# Patient Record
Sex: Female | Born: 1985 | Race: Black or African American | Hispanic: No | Marital: Single | Smoking: Never smoker
Health system: Southern US, Community
[De-identification: ages and names within clinical notes are randomized; demographics above are authoritative.]

## PROBLEM LIST (undated history)

## (undated) ENCOUNTER — Inpatient Hospital Stay (HOSPITAL_COMMUNITY): Payer: Self-pay

## (undated) DIAGNOSIS — S82899A Other fracture of unspecified lower leg, initial encounter for closed fracture: Secondary | ICD-10-CM

## (undated) DIAGNOSIS — IMO0002 Reserved for concepts with insufficient information to code with codable children: Secondary | ICD-10-CM

## (undated) DIAGNOSIS — B009 Herpesviral infection, unspecified: Secondary | ICD-10-CM

## (undated) DIAGNOSIS — F319 Bipolar disorder, unspecified: Secondary | ICD-10-CM

## (undated) DIAGNOSIS — F419 Anxiety disorder, unspecified: Secondary | ICD-10-CM

## (undated) DIAGNOSIS — E109 Type 1 diabetes mellitus without complications: Secondary | ICD-10-CM

## (undated) DIAGNOSIS — F32A Depression, unspecified: Secondary | ICD-10-CM

## (undated) DIAGNOSIS — D649 Anemia, unspecified: Secondary | ICD-10-CM

## (undated) DIAGNOSIS — D563 Thalassemia minor: Secondary | ICD-10-CM

## (undated) DIAGNOSIS — N390 Urinary tract infection, site not specified: Secondary | ICD-10-CM

## (undated) DIAGNOSIS — R87619 Unspecified abnormal cytological findings in specimens from cervix uteri: Secondary | ICD-10-CM

## (undated) DIAGNOSIS — A749 Chlamydial infection, unspecified: Secondary | ICD-10-CM

## (undated) DIAGNOSIS — Z8669 Personal history of other diseases of the nervous system and sense organs: Secondary | ICD-10-CM

## (undated) DIAGNOSIS — K900 Celiac disease: Secondary | ICD-10-CM

## (undated) DIAGNOSIS — R51 Headache: Secondary | ICD-10-CM

## (undated) DIAGNOSIS — F329 Major depressive disorder, single episode, unspecified: Secondary | ICD-10-CM

## (undated) DIAGNOSIS — O139 Gestational [pregnancy-induced] hypertension without significant proteinuria, unspecified trimester: Secondary | ICD-10-CM

## (undated) HISTORY — DX: Thalassemia minor: D56.3

## (undated) HISTORY — DX: Herpesviral infection, unspecified: B00.9

## (undated) HISTORY — DX: Type 1 diabetes mellitus without complications: E10.9

## (undated) HISTORY — PX: COLPOSCOPY: SHX161

## (undated) HISTORY — DX: Celiac disease: K90.0

## (undated) HISTORY — PX: TUBAL LIGATION: SHX77

## (undated) HISTORY — PX: WISDOM TOOTH EXTRACTION: SHX21

---
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
## 2007-02-01 ENCOUNTER — Inpatient Hospital Stay (HOSPITAL_COMMUNITY): Admission: AD | Admit: 2007-02-01 | Discharge: 2007-02-01 | Payer: Self-pay | Admitting: Family Medicine

## 2007-02-09 ENCOUNTER — Emergency Department (HOSPITAL_COMMUNITY): Admission: EM | Admit: 2007-02-09 | Discharge: 2007-02-09 | Payer: Self-pay | Admitting: Emergency Medicine

## 2007-07-30 ENCOUNTER — Inpatient Hospital Stay (HOSPITAL_COMMUNITY): Admission: AD | Admit: 2007-07-30 | Discharge: 2007-07-30 | Payer: Self-pay | Admitting: Obstetrics and Gynecology

## 2007-08-26 ENCOUNTER — Inpatient Hospital Stay (HOSPITAL_COMMUNITY): Admission: AD | Admit: 2007-08-26 | Discharge: 2007-08-26 | Payer: Self-pay | Admitting: Obstetrics and Gynecology

## 2007-08-29 ENCOUNTER — Inpatient Hospital Stay (HOSPITAL_COMMUNITY): Admission: AD | Admit: 2007-08-29 | Discharge: 2007-08-29 | Payer: Self-pay | Admitting: Obstetrics and Gynecology

## 2007-09-04 ENCOUNTER — Inpatient Hospital Stay (HOSPITAL_COMMUNITY): Admission: AD | Admit: 2007-09-04 | Discharge: 2007-09-04 | Payer: Self-pay | Admitting: Obstetrics and Gynecology

## 2007-09-10 ENCOUNTER — Inpatient Hospital Stay (HOSPITAL_COMMUNITY): Admission: AD | Admit: 2007-09-10 | Discharge: 2007-09-14 | Payer: Self-pay | Admitting: Obstetrics and Gynecology

## 2007-09-12 ENCOUNTER — Encounter (INDEPENDENT_AMBULATORY_CARE_PROVIDER_SITE_OTHER): Payer: Self-pay | Admitting: Obstetrics and Gynecology

## 2008-07-08 ENCOUNTER — Ambulatory Visit: Payer: Self-pay | Admitting: Diagnostic Radiology

## 2008-07-08 ENCOUNTER — Emergency Department (HOSPITAL_BASED_OUTPATIENT_CLINIC_OR_DEPARTMENT_OTHER): Admission: EM | Admit: 2008-07-08 | Discharge: 2008-07-08 | Payer: Self-pay | Admitting: Emergency Medicine

## 2008-07-09 ENCOUNTER — Inpatient Hospital Stay (HOSPITAL_COMMUNITY): Admission: AD | Admit: 2008-07-09 | Discharge: 2008-07-10 | Payer: Self-pay | Admitting: Obstetrics and Gynecology

## 2009-01-30 ENCOUNTER — Emergency Department (HOSPITAL_BASED_OUTPATIENT_CLINIC_OR_DEPARTMENT_OTHER): Admission: EM | Admit: 2009-01-30 | Discharge: 2009-01-30 | Payer: Self-pay | Admitting: Emergency Medicine

## 2009-03-05 ENCOUNTER — Emergency Department (HOSPITAL_BASED_OUTPATIENT_CLINIC_OR_DEPARTMENT_OTHER): Admission: EM | Admit: 2009-03-05 | Discharge: 2009-03-05 | Payer: Self-pay | Admitting: Emergency Medicine

## 2009-05-31 ENCOUNTER — Emergency Department (HOSPITAL_BASED_OUTPATIENT_CLINIC_OR_DEPARTMENT_OTHER): Admission: EM | Admit: 2009-05-31 | Discharge: 2009-05-31 | Payer: Self-pay | Admitting: Emergency Medicine

## 2009-06-16 ENCOUNTER — Emergency Department (HOSPITAL_BASED_OUTPATIENT_CLINIC_OR_DEPARTMENT_OTHER): Admission: EM | Admit: 2009-06-16 | Discharge: 2009-06-16 | Payer: Self-pay | Admitting: Emergency Medicine

## 2009-09-27 ENCOUNTER — Emergency Department (HOSPITAL_BASED_OUTPATIENT_CLINIC_OR_DEPARTMENT_OTHER): Admission: EM | Admit: 2009-09-27 | Discharge: 2009-09-27 | Payer: Self-pay | Admitting: Emergency Medicine

## 2010-02-07 ENCOUNTER — Emergency Department (HOSPITAL_BASED_OUTPATIENT_CLINIC_OR_DEPARTMENT_OTHER): Admission: EM | Admit: 2010-02-07 | Discharge: 2010-02-07 | Payer: Self-pay | Admitting: Emergency Medicine

## 2010-04-19 ENCOUNTER — Ambulatory Visit: Payer: Self-pay | Admitting: Obstetrics & Gynecology

## 2010-04-19 LAB — CONVERTED CEMR LAB: HCV Ab: NEGATIVE

## 2010-04-20 ENCOUNTER — Encounter: Payer: Self-pay | Admitting: Obstetrics & Gynecology

## 2010-04-20 LAB — CONVERTED CEMR LAB
Trich, Wet Prep: NONE SEEN
Yeast Wet Prep HPF POC: NONE SEEN

## 2010-06-19 ENCOUNTER — Encounter: Payer: Self-pay | Admitting: Obstetrics & Gynecology

## 2010-06-19 ENCOUNTER — Ambulatory Visit: Payer: Self-pay | Admitting: Obstetrics & Gynecology

## 2010-06-19 LAB — CONVERTED CEMR LAB: Chlamydia, DNA Probe: NEGATIVE

## 2010-06-20 ENCOUNTER — Encounter: Payer: Self-pay | Admitting: Obstetrics & Gynecology

## 2010-06-20 LAB — CONVERTED CEMR LAB
Trich, Wet Prep: NONE SEEN
Yeast Wet Prep HPF POC: NONE SEEN

## 2010-08-22 ENCOUNTER — Ambulatory Visit: Payer: Self-pay

## 2010-08-22 DIAGNOSIS — Z23 Encounter for immunization: Secondary | ICD-10-CM

## 2010-09-04 ENCOUNTER — Emergency Department (HOSPITAL_BASED_OUTPATIENT_CLINIC_OR_DEPARTMENT_OTHER)
Admission: EM | Admit: 2010-09-04 | Discharge: 2010-09-04 | Disposition: A | Discharge status: Home / Self Care | Payer: Self-pay | Attending: Emergency Medicine | Admitting: Emergency Medicine

## 2010-09-04 DIAGNOSIS — J029 Acute pharyngitis, unspecified: Secondary | ICD-10-CM | POA: Insufficient documentation

## 2010-09-04 DIAGNOSIS — J45909 Unspecified asthma, uncomplicated: Secondary | ICD-10-CM | POA: Insufficient documentation

## 2010-09-07 ENCOUNTER — Encounter: Payer: Self-pay | Admitting: Obstetrics and Gynecology

## 2010-09-07 ENCOUNTER — Ambulatory Visit: Payer: Self-pay | Admitting: Obstetrics and Gynecology

## 2010-09-07 DIAGNOSIS — Z118 Encounter for screening for other infectious and parasitic diseases: Secondary | ICD-10-CM

## 2010-09-07 DIAGNOSIS — Z113 Encounter for screening for infections with a predominantly sexual mode of transmission: Secondary | ICD-10-CM

## 2010-09-07 LAB — CONVERTED CEMR LAB
Chlamydia, DNA Probe: NEGATIVE
HCV Ab: NEGATIVE
HIV: NONREACTIVE

## 2010-09-08 ENCOUNTER — Encounter: Payer: Self-pay | Admitting: Obstetrics and Gynecology

## 2010-09-08 LAB — CONVERTED CEMR LAB: Yeast Wet Prep HPF POC: NONE SEEN

## 2010-09-18 LAB — POCT URINALYSIS DIPSTICK
Bilirubin Urine: NEGATIVE
Nitrite: NEGATIVE
Protein, ur: NEGATIVE mg/dL
Urobilinogen, UA: 0.2 mg/dL (ref 0.0–1.0)
pH: 6 (ref 5.0–8.0)

## 2010-09-22 LAB — DIFFERENTIAL
Eosinophils Absolute: 0.1 10*3/uL (ref 0.0–0.7)
Eosinophils Relative: 2 % (ref 0–5)
Lymphs Abs: 1.5 10*3/uL (ref 0.7–4.0)
Monocytes Absolute: 0.4 10*3/uL (ref 0.1–1.0)

## 2010-09-22 LAB — URINALYSIS, ROUTINE W REFLEX MICROSCOPIC
Bilirubin Urine: NEGATIVE
Glucose, UA: NEGATIVE mg/dL
Ketones, ur: NEGATIVE mg/dL
Protein, ur: NEGATIVE mg/dL

## 2010-09-22 LAB — COMPREHENSIVE METABOLIC PANEL
ALT: 23 U/L (ref 0–35)
AST: 27 U/L (ref 0–37)
Albumin: 4 g/dL (ref 3.5–5.2)
CO2: 27 mEq/L (ref 19–32)
Calcium: 9.1 mg/dL (ref 8.4–10.5)
Chloride: 107 mEq/L (ref 96–112)
GFR calc Af Amer: >60 mL/min (ref >60)
GFR calc non Af Amer: >60 mL/min (ref >60)
Sodium: 141 mEq/L (ref 135–145)
Total Bilirubin: 0.7 mg/dL (ref 0.3–1.2)

## 2010-09-22 LAB — URINE MICROSCOPIC-ADD ON

## 2010-09-22 LAB — CBC
Hemoglobin: 10.2 g/dL — ABNORMAL LOW (ref 12.0–15.0)
RBC: 4.3 MIL/uL (ref 3.87–5.11)
WBC: 3.1 10*3/uL — ABNORMAL LOW (ref 4.0–10.5)

## 2010-09-22 LAB — LIPASE, BLOOD: Lipase: 185 U/L (ref 23–300)

## 2010-09-29 NOTE — Progress Notes (Signed)
NAME:  Christie [name], Christie [name]           ACCOUNT NO.:  [id number]  MEDICAL RECORD NO.:  [id number]           PATIENT TYPE:  A  LOCATION:  [facility]                   FACILITY:  [facility]  PHYSICIAN:  Caren Griffins, CNM       DATE OF BIRTH:  Nov 01, 1985  DATE OF SERVICE:  09/07/2010                                 CLINIC NOTE  REASON FOR VISIT:  Wants STD testing and has lower abdominal pain.  HISTORY:  This is a 25 year old G2 P 1-0-0-1 who is here requesting testing for all the STDs, this is primarily because she has a new partner and also because she has had some postcoital spotting since she had that in the past when she has had Chlamydia and BV.  She is concerned that she might have that again.  She is aware that her GC and Chlamydia were negative when she had her Pap done here on April 24, 2010.  She denies any irritative vaginal discharge and does not have dyspareunia.  LMP was normal on August 23, 2010.  She uses only condoms and plans [facility] to get started on family planning that would be more effective than condoms. She is describing a 2-year history of lower abdominal pain which is primarily in the left lower quadrant and feels like a knot.  She relates the pain to occurring when she has stress or gets mad.  She denies burning in relation to food, has normal bowel movements, and the pain is not effected by bowel movements.  She is not having any pain at present, and she also describes abdominal cramping, primarily with menses, again is not having any cramping at this time.  Pap was normal in October 2011, and there are no changes to her history as documented at that time.  PHYSICAL EXAMINATION:  GENERAL:  WN/WD, pleasant AAF. VITAL SIGNS:  Temperature 97.5, pulse 82, BP 120/66, weight 177.6, height is 5 feet 9 inches. HEENT:  Head normocephalic.  Good dentition. BREASTS:  Breast exam is not repeated. ABDOMEN:  Soft, flat, nontender and no  masses. PELVIC:  NEFG.  BUS negative.  Vagina pink, rugated.  Cervix posterior, no lesions, not friable with exam.  No CMT.  On bimanual, uterus is NSSP.  Nontender adnexa and no masses.  IMPRESSION AND PLAN:  Essentially normal exam.  Her longstanding abdominal pain may be of gastrointestinal origin since she relates its occurrence to stress, so we did talk about stress release measures. Again discussed safe sex and the fact that condoms are only 80% effective, she understands this and plans to go the [facility] for birth control.  I did wet prep, GC/Chlamydia, RPR, HIV, hepatitis B and C as she requests.  FOLLOWUP:  She will be coming back in about 4 months and does have that appointment date to complete her series for Gardasil.  She will come back sooner for any other GYN concerns.          ______________________________ Caren Griffins, CNM   DP/MEDQ  D:  09/07/2010  T:  09/08/2010  Job:  [id number]

## 2010-10-23 LAB — WET PREP, GENITAL
Clue Cells Wet Prep HPF POC: NONE SEEN
Yeast Wet Prep HPF POC: NONE SEEN

## 2010-10-23 LAB — HEPATITIS B SURFACE ANTIGEN: Hepatitis B Surface Ag: NEGATIVE

## 2010-11-21 NOTE — Discharge Summary (Signed)
NAME:  Christie [name], Christie [name]           ACCOUNT NO.:  [id number]   MEDICAL RECORD NO.:  [id number]          PATIENT TYPE:  INP   LOCATION:  9320                          [facility]   PHYSICIAN:  Naima A. Dillard, M.D. DATE OF BIRTH:  March 12, 1986   DATE OF ADMISSION:  09/10/2007  DATE OF DISCHARGE:  09/14/2007                               DISCHARGE SUMMARY   ADMITTING DIAGNOSES:  1. Intrauterine pregnancy at 37-5/7 weeks.  2. Gestational hypertension.  3. Favorable cervix.   DISCHARGE DIAGNOSES:  1. Intrauterine pregnancy at 37-5/7 weeks.  2. Preeclampsia.  3. Macrosomia.  4. Anemia.   PROCEDURES:  1. Normal spontaneous vaginal delivery.  2. Induction of labor.  3. Magnesium sulfate therapy.   HOSPITAL COURSE:  Christie [name] is a 25 year old gravida 1, para 0 at 28-  5/7 weeks, who presented for decreased fetal movement, on the evening of  September 11, 2007.  Her pregnancy had been remarkable for:  1. Asthma.  2. Migraines.  3. Depression with the patient on Zoloft 100 mg p.o. daily.  4. Gestational hypertension on labetalol 3 mg p.o. t.i.d.  5. Group B strep negative.   On arrival to [facility], 88 blood pressures were in the 140's/98-113.  These  remained elevated after labetalol dose, with d above 100.  Cervix was  37% vertex at minus -1.  She had 100 mg protein on voided specimen.  PIH  labs were normal.  The decision was made, in light of her cervix being 3  cm, to admit her for induction.  Pitocin was begun during the night.  The patient had an epidural placed.  Blood pressures were still  150s/105.  Labetalol IV was given on a p.r.n. basis.  Platelet count  showed a mild thrombocytopenia at 139.  Approximately 4 hours later, it  was 120.  IUPC was placed.  The patient progressed to 8 cm by 7:50 p.m.  on the afternoon of March 5.  Subsequent of that, she began to complain  of some right upper quadrant pain.  Blood pressure was 128/86, after a  series of labetalol dosing.  PIH  labs were repeated.  These were  essentially normal, except that time platelet count was 139.  The  patient then progressed to completely dilated by 1:45.  The fetus was at  a +2 station.  Blood pressure was 130s/100.  She had been placed on  magnesium sulfate, with the onset of her labor.  The patient had a  spontaneous vaginal delivery on September 12, 2007 at 2:27 a.m.  She had a  viable female, weight 9 pounds 6 ounces.  Apgars were 1 and 7.  They  responded quickly to stimulation and positive pressure ventilation.  The  thought was this was primarily hypermagnesemia with possibility of a  body cord, secondary to effect on placental blood flow peripartum.  Infant remained in good condition after the 5-minute Apgar and initially  was taken to the full-term nursery.  Infant was subsequently taken to  NICU for respiratory issues.  Cord pH was 7.27 and baby's name is  Tahjir.  There were no lacerations  noted.  She had some loss of 750 mL.  By later on postpartum day zero, blood pressures 145/99.  The patient  was continued on magnesium.  O2 sats were normal.  Urine output was in  the 100-200 mL/per hour range.  Her magnesium level was 6.3, and she was  feeling the effects of this with feeling slightly nauseated and  generally heavy.  Hemoglobin was 10.3, platelet count was 124.  PIH labs  were normal.  Uric acid was 7.1.  Dr. Normand Sloop was consulted.  Magnesium  sulfate was turned to one.  Plan was made to re-check the mag level in 4  hours.  Social work consult was also obtained.  There have been some  emotional difficulty in the family, during the patient's  hospitalization.  On postpartum day #1, later the afternoon, Dr. Normand Sloop  saw the patient, urine output was good, blood pressures were 119 to 138  over 73 to 91.  Hemoglobin was 7.5.  Magnesium sulfate was discontinued.  The patient was placed on a stool softeners and iron supplement.  The  patient did decline transfusion.  By the morning of  postpartum day #2,  the patient was doing well.  Infant was stable in NICU and was eating  better.  The patient denied any headache, visual symptoms, or epigastric  pain.  Blood pressures were generally in the 110s to 120s over 77 to 84.  There were a few that had diastolics of 97 or less.   PHYSICAL EXAMINATION:  Her physical exam was within normal limits.  Fundus was firm.  Lochia was scant.  Extremities showed 2+ reflexes with  1+ edema.   LABS:  Hemoglobin was 7.9, white blood cell count 8, and platelet count  147.  Comprehensive metabolic panel was normal.  Uric acid was 7.8.  The  patient was up ad lib.  Her appetite was somewhat diminished, but she  was encouraged to eat and drink well to allow for breast milk production  and pumping.  The issue of postpartum depression was also reviewed.  The  patient is currently on Zoloft 100 mg p.o. daily.  Dr. Normand Sloop was  consulted, and the decision was made to discharge the patient home.   DISCHARGE INSTRUCTIONS:  The patient is to monitor for PIH signs and  symptoms.  She will call for any of these issues that may be worsening.   DISCHARGE MEDICATIONS:  1. HCTZ 25 mg one p.o. daily.  2. Motrin 600 mg one p.o. q.6 h. p.r.n. pain.  3. Percocet 5/325 one to two p.o. q.2-4 hours p.r.n. pain.  4. Repliva one p.o. b.i.d.  5. The patient will continue Zoloft 100 mg p.o. daily.   DISCHARGE FOLLOWUP:  Will occur in 1 week at [facility] for  blood pressure check and re-evaluation or p.r.n.      Christie [name], C.N.M.      Naima A. Normand Sloop, M.D.  Electronically Signed    VLL/MEDQ  D:  09/14/2007  T:  09/15/2007  Job:  [id number]
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
## 2010-12-13 ENCOUNTER — Other Ambulatory Visit: Payer: Self-pay | Admitting: Advanced Practice Midwife

## 2010-12-13 ENCOUNTER — Ambulatory Visit: Payer: Self-pay | Admitting: Advanced Practice Midwife

## 2010-12-13 DIAGNOSIS — N898 Other specified noninflammatory disorders of vagina: Secondary | ICD-10-CM

## 2010-12-13 LAB — POCT PREGNANCY, URINE: Preg Test, Ur: NEGATIVE

## 2010-12-13 LAB — POCT URINALYSIS DIP (DEVICE)
Bilirubin Urine: NEGATIVE
Ketones, ur: NEGATIVE mg/dL
Nitrite: NEGATIVE
Protein, ur: NEGATIVE mg/dL
pH: 7 (ref 5.0–8.0)

## 2010-12-14 ENCOUNTER — Ambulatory Visit: Payer: Self-pay

## 2010-12-14 NOTE — Group Therapy Note (Unsigned)
NAME:  Christie [name], Christie [name]           ACCOUNT NO.:  [id number]  MEDICAL RECORD NO.:  [id number]           PATIENT TYPE:  A  LOCATION:  [facility]                   FACILITY:  [facility]  PHYSICIAN:  Wynelle Bourgeois, CNM    DATE OF BIRTH:  September 28, 1985  DATE OF SERVICE:  12/13/2010                                 CLINIC NOTE  This is a 25 year old gravida 1, para 1 who presents with complaints of vaginal discharge that she feels sometimes has an odor.  She has had bacterial vaginosis twice in the past year and is concerned that she has it again.  She is also having some issues with her current sexual partner and is requesting full STD testing including blood testing.  She was here in March for similar complaints and also had full testing at that point which was all negative.  She states that she suspects her partner has been cheating on her.  She has only rarely uses condoms and states the last time they used them it broke.  She [facility] not [facility] yet.  She states that she has been on 3 kinds of pills in the past for menstrual regulation and has had troubles with nausea.  She does not want to use Depo-Provera secondary to weight gain and maybe interested in the NuvaRing, but does not want to initiate that today.  She wants to go home and read up on it before she makes a decision.  She does not have any complaints of abdominal pain or other complaints.  PHYSICAL EXAMINATION:  VITAL SIGNS:  Temperature 99.9, pulse 77, blood pressure 122/77, weight 184.1 which is up from 177 at her last visit, height 69 inches. ABDOMEN:  Soft and nontender.  EG/BUS is normal with no lesions or erythema noted.  Vagina is clear.  There is a small amount of thin white discharge.  Cervix is closed and long with a small amount of ectropion visible and cervix is not overly friable.  Uterus is small and nontender without masses.  Adnexa are nontender bilaterally and no masses  are appreciated.  Wet prep is GC and Chlamydia were collected  ASSESSMENT: 1. Vaginal discharge without odor. 2. High-risk social behavior with concern for sexually transmitted     diseases. 3. Lack of contraception.  PLAN: 1. GC and Chlamydia sent. 2. HIV, hepatitis and RPR ordered and wet prep sent. 3. Discussed contraception.  The patient is considering NuraRing, but     does not want a prescription for that today. 4. Discussed safe sex and with condom use and also abstinence as a     precaution for prevention of lethal and nonlethal STD risks.  The     patient is agreeable to reconsider use of condoms. 5. Return as needed for contraceptive visits and in October for annual     exam.          ______________________________ Wynelle Bourgeois, CNM   MW/MEDQ  D:  12/13/2010  T:  12/14/2010  Job:  [id number]

## 2010-12-18 ENCOUNTER — Ambulatory Visit: Payer: Self-pay

## 2010-12-19 ENCOUNTER — Ambulatory Visit: Payer: Self-pay

## 2010-12-19 DIAGNOSIS — Z23 Encounter for immunization: Secondary | ICD-10-CM

## 2010-12-26 ENCOUNTER — Ambulatory Visit: Payer: Self-pay

## 2011-02-16 ENCOUNTER — Encounter: Payer: Self-pay | Admitting: *Deleted

## 2011-02-16 ENCOUNTER — Emergency Department (HOSPITAL_BASED_OUTPATIENT_CLINIC_OR_DEPARTMENT_OTHER)
Admission: EM | Admit: 2011-02-16 | Discharge: 2011-02-17 | Discharge status: Against Medical Advice | Payer: Self-pay | Attending: Emergency Medicine | Admitting: Emergency Medicine

## 2011-02-16 DIAGNOSIS — R6884 Jaw pain: Secondary | ICD-10-CM | POA: Insufficient documentation

## 2011-02-16 NOTE — ED Notes (Signed)
Pt c/o right side jaw pain at her TMJ and under her mandible that is worse when she turns her head to the right.

## 2011-02-17 NOTE — ED Notes (Signed)
Pt left without notifying staff.  

## 2011-03-30 LAB — CBC
HCT: 36.8
MCHC: 34
MCV: 81.4
MCV: 82.1
Platelets: 118 — ABNORMAL LOW
Platelets: 131 — ABNORMAL LOW
RBC: 4.61
RDW: 14.8
WBC: 6.2
WBC: 6.8

## 2011-03-30 LAB — LACTATE DEHYDROGENASE: LDH: 121

## 2011-03-30 LAB — COMPREHENSIVE METABOLIC PANEL
ALT: 14
AST: 18
AST: 18
Albumin: 2.5 — ABNORMAL LOW
Albumin: 2.5 — ABNORMAL LOW
Alkaline Phosphatase: 97
BUN: 3 — ABNORMAL LOW
CO2: 23
Calcium: 9.6
Chloride: 102
Chloride: 105
Creatinine, Ser: 0.56
Creatinine, Ser: 0.64
GFR calc Af Amer: >60
GFR calc Af Amer: >60
GFR calc non Af Amer: >60
Glucose, Bld: 135 — ABNORMAL HIGH
Potassium: 3.6
Potassium: 3.7
Sodium: 131 — ABNORMAL LOW
Total Bilirubin: 0.5
Total Bilirubin: 0.8
Total Protein: 6.6

## 2011-03-30 LAB — URIC ACID: Uric Acid, Serum: 5.8

## 2011-04-02 LAB — COMPREHENSIVE METABOLIC PANEL
ALT: 11
ALT: 15
AST: 25
AST: 27
Albumin: 1.5 — ABNORMAL LOW
Albumin: 1.7 — ABNORMAL LOW
Alkaline Phosphatase: 57
Alkaline Phosphatase: 58
Alkaline Phosphatase: 81
BUN: 10
BUN: 8
BUN: 8
BUN: 9
CO2: 23
CO2: 24
Calcium: 7.8 — ABNORMAL LOW
Calcium: 7.9 — ABNORMAL LOW
Calcium: 8.3 — ABNORMAL LOW
Calcium: 8.7
Calcium: 9.4
Chloride: 101
Chloride: 104
Creatinine, Ser: 0.79
Creatinine, Ser: 0.87
GFR calc Af Amer: >60
GFR calc Af Amer: >60
GFR calc Af Amer: >60
GFR calc non Af Amer: >60
GFR calc non Af Amer: >60
GFR calc non Af Amer: >60
Glucose, Bld: 111 — ABNORMAL HIGH
Glucose, Bld: 118 — ABNORMAL HIGH
Potassium: 3.2 — ABNORMAL LOW
Potassium: 3.4 — ABNORMAL LOW
Potassium: 4.2
Sodium: 133 — ABNORMAL LOW
Sodium: 137
Total Bilirubin: 0.5
Total Bilirubin: 1
Total Protein: 4.3 — ABNORMAL LOW
Total Protein: 4.6 — ABNORMAL LOW
Total Protein: 6.2
Total Protein: 6.4

## 2011-04-02 LAB — MAGNESIUM
Magnesium: 4.9 — ABNORMAL HIGH
Magnesium: 6.3

## 2011-04-02 LAB — DIFFERENTIAL
Basophils Relative: 0
Eosinophils Relative: 0
Monocytes Absolute: 0.8
Monocytes Relative: 9
Neutro Abs: 6.3

## 2011-04-02 LAB — CBC
HCT: 22.8 — ABNORMAL LOW
HCT: 30.1 — ABNORMAL LOW
HCT: 36
Hemoglobin: 10.9 — ABNORMAL LOW
Hemoglobin: 12.3
Hemoglobin: 7.9 — CL
MCHC: 33.8
MCHC: 33.9
MCHC: 34
MCHC: 34.2
MCHC: 34.3
MCHC: 34.4
MCV: 80.8
MCV: 81
MCV: 81.3
Platelets: 115 — ABNORMAL LOW
Platelets: 124 — ABNORMAL LOW
Platelets: 134 — ABNORMAL LOW
Platelets: 139 — ABNORMAL LOW
RBC: 3.72 — ABNORMAL LOW
RBC: 3.95
RBC: 4.26
RBC: 4.72
RDW: 14.1
RDW: 14.1
RDW: 14.1
RDW: 14.5
WBC: 12.6 — ABNORMAL HIGH
WBC: 8
WBC: 9.3
WBC: 9.6

## 2011-04-02 LAB — URINALYSIS, ROUTINE W REFLEX MICROSCOPIC
Glucose, UA: NEGATIVE
Ketones, ur: NEGATIVE
Ketones, ur: NEGATIVE
Leukocytes, UA: NEGATIVE
Leukocytes, UA: NEGATIVE
Nitrite: NEGATIVE
Nitrite: NEGATIVE
Protein, ur: 100 — AB
Protein, ur: 100 — AB
Urobilinogen, UA: 0.2

## 2011-04-02 LAB — URIC ACID
Uric Acid, Serum: 5.1
Uric Acid, Serum: 7.1 — ABNORMAL HIGH
Uric Acid, Serum: 7.3 — ABNORMAL HIGH
Uric Acid, Serum: 7.8 — ABNORMAL HIGH

## 2011-04-02 LAB — URINE MICROSCOPIC-ADD ON

## 2011-04-02 LAB — LACTATE DEHYDROGENASE
LDH: 137
LDH: 179

## 2011-04-02 LAB — SAMPLE TO BLOOD BANK

## 2011-04-23 LAB — CBC
HCT: 38.8
Hemoglobin: 12.9
MCHC: 33.3
MCV: 78 — ABNORMAL LOW
Platelets: 207
Platelets: 231
RBC: 4.98
RDW: 15.5 — ABNORMAL HIGH
RDW: 15.1 — ABNORMAL HIGH
WBC: 5

## 2011-04-23 LAB — POCT PREGNANCY, URINE
Operator id: 222261
Preg Test, Ur: POSITIVE

## 2011-04-23 LAB — URINE MICROSCOPIC-ADD ON

## 2011-04-23 LAB — WET PREP, GENITAL
Clue Cells Wet Prep HPF POC: NONE SEEN
Trich, Wet Prep: NONE SEEN
Yeast Wet Prep HPF POC: NONE SEEN

## 2011-04-23 LAB — URINALYSIS, ROUTINE W REFLEX MICROSCOPIC
Bilirubin Urine: NEGATIVE
Glucose, UA: NEGATIVE
Hgb urine dipstick: NEGATIVE
Ketones, ur: NEGATIVE
Nitrite: NEGATIVE
Protein, ur: NEGATIVE
Specific Gravity, Urine: 1.015
Urobilinogen, UA: 0.2
pH: 6.5

## 2011-04-23 LAB — GC/CHLAMYDIA PROBE AMP, GENITAL
Chlamydia, DNA Probe: NEGATIVE
GC Probe Amp, Genital: NEGATIVE

## 2011-04-23 LAB — ABO/RH: ABO/RH(D): A POS

## 2011-04-23 LAB — HCG, QUANTITATIVE, PREGNANCY: hCG, Beta Chain, Quant, S: 21363 — ABNORMAL HIGH

## 2011-06-17 ENCOUNTER — Encounter (HOSPITAL_COMMUNITY): Payer: Self-pay | Admitting: *Deleted

## 2011-06-17 ENCOUNTER — Inpatient Hospital Stay (HOSPITAL_COMMUNITY)
Admission: AD | Admit: 2011-06-17 | Discharge: 2011-06-19 | DRG: 638 | Disposition: A | Discharge status: Home / Self Care | Payer: Self-pay | Source: Ambulatory Visit | Attending: Internal Medicine | Admitting: Internal Medicine

## 2011-06-17 DIAGNOSIS — Z8759 Personal history of other complications of pregnancy, childbirth and the puerperium: Secondary | ICD-10-CM | POA: Diagnosis not present

## 2011-06-17 DIAGNOSIS — IMO0002 Reserved for concepts with insufficient information to code with codable children: Secondary | ICD-10-CM | POA: Diagnosis present

## 2011-06-17 DIAGNOSIS — F3289 Other specified depressive episodes: Secondary | ICD-10-CM | POA: Diagnosis present

## 2011-06-17 DIAGNOSIS — E119 Type 2 diabetes mellitus without complications: Secondary | ICD-10-CM

## 2011-06-17 DIAGNOSIS — G43909 Migraine, unspecified, not intractable, without status migrainosus: Secondary | ICD-10-CM | POA: Diagnosis present

## 2011-06-17 DIAGNOSIS — E871 Hypo-osmolality and hyponatremia: Secondary | ICD-10-CM | POA: Diagnosis present

## 2011-06-17 DIAGNOSIS — Z8669 Personal history of other diseases of the nervous system and sense organs: Secondary | ICD-10-CM | POA: Diagnosis not present

## 2011-06-17 DIAGNOSIS — E1065 Type 1 diabetes mellitus with hyperglycemia: Secondary | ICD-10-CM | POA: Diagnosis present

## 2011-06-17 DIAGNOSIS — E1010 Type 1 diabetes mellitus with ketoacidosis without coma: Principal | ICD-10-CM | POA: Diagnosis present

## 2011-06-17 DIAGNOSIS — E878 Other disorders of electrolyte and fluid balance, not elsewhere classified: Secondary | ICD-10-CM

## 2011-06-17 DIAGNOSIS — Z91018 Allergy to other foods: Secondary | ICD-10-CM

## 2011-06-17 DIAGNOSIS — F329 Major depressive disorder, single episode, unspecified: Secondary | ICD-10-CM | POA: Diagnosis present

## 2011-06-17 HISTORY — DX: Other fracture of unspecified lower leg, initial encounter for closed fracture: S82.899A

## 2011-06-17 HISTORY — DX: Personal history of other diseases of the nervous system and sense organs: Z86.69

## 2011-06-17 HISTORY — DX: Anemia, unspecified: D64.9

## 2011-06-17 HISTORY — DX: Depression, unspecified: F32.A

## 2011-06-17 HISTORY — DX: Major depressive disorder, single episode, unspecified: F32.9

## 2011-06-17 LAB — BASIC METABOLIC PANEL
BUN: 12 mg/dL (ref 6–23)
CO2: 24 mEq/L (ref 19–32)
Calcium: 9.4 mg/dL (ref 8.4–10.5)
GFR calc non Af Amer: >90 mL/min (ref >90)
Glucose, Bld: 561 mg/dL (ref 70–99)

## 2011-06-17 LAB — URINALYSIS, ROUTINE W REFLEX MICROSCOPIC
Hgb urine dipstick: NEGATIVE
Ketones, ur: NEGATIVE mg/dL
Protein, ur: NEGATIVE mg/dL
Urobilinogen, UA: 0.2 mg/dL (ref 0.0–1.0)

## 2011-06-17 LAB — WET PREP, GENITAL
Clue Cells Wet Prep HPF POC: NONE SEEN
Trich, Wet Prep: NONE SEEN
Yeast Wet Prep HPF POC: NONE SEEN

## 2011-06-17 LAB — URINE MICROSCOPIC-ADD ON

## 2011-06-17 MED ORDER — METFORMIN HCL 500 MG PO TABS
500.0000 mg | ORAL_TABLET | Freq: Once | ORAL | Status: DC
  Filled 2011-06-17: qty 1

## 2011-06-17 NOTE — Progress Notes (Signed)
Pt LMP 05/29/2011, having lower abd and back pain, vag irritation and swelling x 2 wks.  Pt G1 P1.

## 2011-06-17 NOTE — ED Provider Notes (Signed)
History     Chief Complaint  Patient presents with  . Back Pain  . Abdominal Pain   HPI This is a 25 y.o. who presents with c/o urinary frequency and denies dysuria. States has to void every 15 minutes and is always thirsty.   Also c/o pruritis of labia. Used Monistat without relief. No other complaints.   OB History    Grav Para Term Preterm Abortions TAB SAB Ect Mult Living   1 1 1       1       Past Medical History  Diagnosis Date  . Asthma   History of Pregnancy Diabetes.  Past Surgical History  Procedure Date  . Wisdom tooth extraction     Family History  Problem Relation Age of Onset  . Asthma Mother   . Diabetes Mother   . Hypertension Mother   . Heart disease Mother   . Asthma Father   . Diabetes Father     History  Substance Use Topics  . Smoking status: Never Smoker   . Smokeless tobacco: Never Used  . Alcohol Use: Yes    Allergies:  Allergies  Allergen Reactions  . Banana Anaphylaxis  . Citrus     Gums bleed  . Adhesive (Tape) Rash    Prescriptions prior to admission  Medication Sig Dispense Refill  . naproxen sodium (ANAPROX) 220 MG tablet Take 220 mg by mouth 2 (two) times daily with a meal.          Review of Systems  Genitourinary: Positive for frequency.  Endo/Heme/Allergies: Positive for polydipsia.   See above  Physical Exam   Blood pressure 119/79, pulse 91, temperature 98.9 F (37.2 C), temperature source Oral, resp. rate 16, height 5\' 9"  (1.753 m), weight 187 lb 9.6 oz (85.095 kg), last menstrual period 05/29/2011.  Physical Exam  Constitutional: She is oriented to person, place, and time. She appears well-developed and well-nourished. No distress.  HENT:  Head: Normocephalic.  Cardiovascular: Normal rate.   Respiratory: Effort normal.  GI: Soft. She exhibits no distension and no mass. There is no tenderness. There is no rebound and no guarding.  Genitourinary: Vagina normal and uterus normal. No vaginal discharge found.         Perineum with pad-shaped pattern of erethema. No erethema of vagina. No significant discharge. Uterus nontender.   Musculoskeletal: Normal range of motion.  Neurological: She is alert and oriented to person, place, and time.  Skin: Skin is warm and dry.  Psychiatric: She has a normal mood and affect.   CBG >600 Results for orders placed during the hospital encounter of 06/17/11 (from the past 24 hour(s))  URINALYSIS, ROUTINE W REFLEX MICROSCOPIC     Status: Abnormal   Collection Time   06/17/11  6:59 PM      Component Value Range   Color, Urine YELLOW  YELLOW    APPearance CLEAR  CLEAR    Specific Gravity, Urine <1.005 (*) 1.005 - 1.030    pH 5.0  5.0 - 8.0    Glucose, UA >1000 (*) NEGATIVE (mg/dL)   Hgb urine dipstick NEGATIVE  NEGATIVE    Bilirubin Urine NEGATIVE  NEGATIVE    Ketones, ur NEGATIVE  NEGATIVE (mg/dL)   Protein, ur NEGATIVE  NEGATIVE (mg/dL)   Urobilinogen, UA 0.2  0.0 - 1.0 (mg/dL)   Nitrite NEGATIVE  NEGATIVE    Leukocytes, UA NEGATIVE  NEGATIVE   URINE MICROSCOPIC-ADD ON     Status: Normal   Collection  Time   06/17/11  6:59 PM      Component Value Range   Squamous Epithelial / LPF RARE  RARE    WBC, UA 0-2  <3 (WBC/hpf)   Urine-Other MUCOUS PRESENT    POCT PREGNANCY, URINE     Status: Normal   Collection Time   06/17/11  7:26 PM      Component Value Range   Preg Test, Ur NEGATIVE    WET PREP, GENITAL     Status: Abnormal   Collection Time   06/17/11  9:05 PM      Component Value Range   Yeast, Wet Prep NONE SEEN  NONE SEEN    Trich, Wet Prep NONE SEEN  NONE SEEN    Clue Cells, Wet Prep NONE SEEN  NONE SEEN    WBC, Wet Prep HPF POC MANY (*) NONE SEEN   GLUCOSE, CAPILLARY     Status: Abnormal   Collection Time   06/17/11  9:16 PM      Component Value Range   Glucose-Capillary >600 (*) 70 - 99 (mg/dL)    MAU Course  Procedures  MDM Discussed with Dr Shawnie Pons. Will check BMP for bicarb.  Assessment and Plan  A:  Urinary Frequency, related to  overt diabetes.       R/O DKA      Perineal pruritis, probably related to use of Always pads  P:  Will pursue plan after BMP resulted       Will rx cortisone cream       Encouraged to obtain Medical Doctor tomorrow.  Does not have a regular Proofreader.   Culliver,Rayvion Stumph CNM 06/17/2011, 10:09 PM   2350 hrs:  Lab was delayed due to other emergency.  BMET reviewed  BMET    Component Value Date/Time   NA 124* 06/17/2011 2243   K 3.6 06/17/2011 2243   CL 90* 06/17/2011 2243   CO2 24 06/17/2011 2243   GLUCOSE 561* 06/17/2011 2243   BUN 12 06/17/2011 2243   CREATININE 0.79 06/17/2011 2243   CALCIUM 9.4 06/17/2011 2243   GFRNONAA >90 06/17/2011 2243   GFRAA >90 06/17/2011 2243    Reviewed with Dr Shawnie Pons who recommended consulting ER MD. Spoke with Dr Susanne Borders at [facility]. He recommends transfer to his ED for evaluation and treatment.  Will send via [facility]. Patient is agreeable.

## 2011-06-18 ENCOUNTER — Encounter (HOSPITAL_COMMUNITY): Payer: Self-pay | Admitting: Family Medicine

## 2011-06-18 DIAGNOSIS — E1065 Type 1 diabetes mellitus with hyperglycemia: Secondary | ICD-10-CM | POA: Diagnosis present

## 2011-06-18 DIAGNOSIS — Z8669 Personal history of other diseases of the nervous system and sense organs: Secondary | ICD-10-CM | POA: Diagnosis not present

## 2011-06-18 DIAGNOSIS — G43909 Migraine, unspecified, not intractable, without status migrainosus: Secondary | ICD-10-CM | POA: Insufficient documentation

## 2011-06-18 DIAGNOSIS — Z8759 Personal history of other complications of pregnancy, childbirth and the puerperium: Secondary | ICD-10-CM | POA: Diagnosis not present

## 2011-06-18 DIAGNOSIS — E871 Hypo-osmolality and hyponatremia: Secondary | ICD-10-CM | POA: Diagnosis present

## 2011-06-18 LAB — BLOOD GAS, ARTERIAL
Acid-base deficit: 3 mmol/L — ABNORMAL HIGH (ref 0.0–2.0)
Bicarbonate: 20.8 mEq/L (ref 20.0–24.0)
Patient temperature: 98.6
TCO2: 19.2 mmol/L (ref 0–100)
pCO2 arterial: 34.7 mmHg — ABNORMAL LOW (ref 35.0–45.0)
pH, Arterial: 7.395 (ref 7.350–7.400)

## 2011-06-18 LAB — BASIC METABOLIC PANEL
BUN: 16 mg/dL (ref 6–23)
CO2: 21 mEq/L (ref 19–32)
Chloride: 105 mEq/L (ref 96–112)
Creatinine, Ser: 0.68 mg/dL (ref 0.50–1.10)
GFR calc Af Amer: >90 mL/min (ref >90)
Potassium: 3.6 mEq/L (ref 3.5–5.1)

## 2011-06-18 LAB — GLUCOSE, CAPILLARY
Glucose-Capillary: 300 mg/dL — ABNORMAL HIGH (ref 70–99)
Glucose-Capillary: 159 mg/dL — ABNORMAL HIGH (ref 70–99)
Glucose-Capillary: 173 mg/dL — ABNORMAL HIGH (ref 70–99)
Glucose-Capillary: 188 mg/dL — ABNORMAL HIGH (ref 70–99)
Glucose-Capillary: 290 mg/dL — ABNORMAL HIGH (ref 70–99)

## 2011-06-18 LAB — HEMOGLOBIN A1C: Mean Plasma Glucose: 246 mg/dL — ABNORMAL HIGH (ref <117)

## 2011-06-18 LAB — CBC
MCH: 23.1 pg — ABNORMAL LOW (ref 26.0–34.0)
MCV: 70.3 fL — ABNORMAL LOW (ref 78.0–100.0)
Platelets: 166 10*3/uL (ref 150–400)
RDW: 14.8 % (ref 11.5–15.5)
WBC: 4.9 10*3/uL (ref 4.0–10.5)

## 2011-06-18 MED ORDER — LIVING WELL WITH DIABETES BOOK
Freq: Once | Status: AC
  Administered 2011-06-18: 16:00:00
  Filled 2011-06-18: qty 1

## 2011-06-18 MED ORDER — INSULIN GLARGINE 100 UNIT/ML ~~LOC~~ SOLN: 10.0000 [IU] | Freq: Once | SUBCUTANEOUS | Status: DC

## 2011-06-18 MED ORDER — INSULIN ASPART 100 UNIT/ML ~~LOC~~ SOLN
0.0000 [IU] | Freq: Every day | SUBCUTANEOUS | Status: DC
  Administered 2011-06-18: 3 [IU] via SUBCUTANEOUS

## 2011-06-18 MED ORDER — SODIUM CHLORIDE 0.9 % IV SOLN
INTRAVENOUS | Status: DC
  Administered 2011-06-18: 2 [IU]/h via INTRAVENOUS
  Filled 2011-06-18: qty 1

## 2011-06-18 MED ORDER — SODIUM CHLORIDE 0.9 % IV SOLN
INTRAVENOUS | Status: DC
  Administered 2011-06-18: 3 [IU]/h via INTRAVENOUS
  Filled 2011-06-18: qty 1

## 2011-06-18 MED ORDER — ACETAMINOPHEN 500 MG PO TABS
1000.0000 mg | ORAL_TABLET | Freq: Four times a day (QID) | ORAL | Status: DC | PRN
  Administered 2011-06-18 – 2011-06-19 (×2): 1000 mg via ORAL
  Filled 2011-06-18: qty 2

## 2011-06-18 MED ORDER — DEXTROSE-NACL 5-0.45 % IV SOLN
INTRAVENOUS | Status: DC
  Administered 2011-06-18: 09:00:00 via INTRAVENOUS

## 2011-06-18 MED ORDER — ONDANSETRON HCL 4 MG/2ML IJ SOLN
4.0000 mg | Freq: Four times a day (QID) | INTRAMUSCULAR | Status: DC | PRN
  Administered 2011-06-18: 4 mg via INTRAVENOUS
  Filled 2011-06-18: qty 2

## 2011-06-18 MED ORDER — INSULIN PEN STARTER KIT
1.0000 | Freq: Once | Status: AC
  Administered 2011-06-18: 1
  Filled 2011-06-18: qty 1

## 2011-06-18 MED ORDER — ONDANSETRON HCL 4 MG PO TABS: 4.0000 mg | ORAL_TABLET | Freq: Four times a day (QID) | ORAL | Status: DC | PRN

## 2011-06-18 MED ORDER — DEXTROSE 50 % IV SOLN: 25.0000 mL | INTRAVENOUS | Status: DC | PRN

## 2011-06-18 MED ORDER — INSULIN ASPART 100 UNIT/ML ~~LOC~~ SOLN
3.0000 [IU] | Freq: Three times a day (TID) | SUBCUTANEOUS | Status: DC
  Administered 2011-06-18 – 2011-06-19 (×3): 3 [IU] via SUBCUTANEOUS

## 2011-06-18 MED ORDER — DEXTROSE-NACL 5-0.45 % IV SOLN: INTRAVENOUS | Status: DC

## 2011-06-18 MED ORDER — ACETAMINOPHEN 500 MG PO TABS
ORAL_TABLET | ORAL | Status: AC
  Administered 2011-06-18: 1000 mg via ORAL
  Filled 2011-06-18: qty 2

## 2011-06-18 MED ORDER — POTASSIUM CHLORIDE 10 MEQ/100ML IV SOLN
10.0000 meq | INTRAVENOUS | Status: DC
  Administered 2011-06-18: 10 meq via INTRAVENOUS
  Filled 2011-06-18 (×2): qty 100

## 2011-06-18 MED ORDER — INSULIN ASPART 100 UNIT/ML ~~LOC~~ SOLN
0.0000 [IU] | Freq: Three times a day (TID) | SUBCUTANEOUS | Status: DC
  Administered 2011-06-18 (×2): 3 [IU] via SUBCUTANEOUS
  Administered 2011-06-19: 5 [IU] via SUBCUTANEOUS
  Administered 2011-06-19: 11 [IU] via SUBCUTANEOUS
  Filled 2011-06-18: qty 3

## 2011-06-18 MED ORDER — ENOXAPARIN SODIUM 40 MG/0.4ML ~~LOC~~ SOLN
40.0000 mg | SUBCUTANEOUS | Status: DC
  Administered 2011-06-18 – 2011-06-19 (×2): 40 mg via SUBCUTANEOUS
  Filled 2011-06-18 (×2): qty 0.4

## 2011-06-18 MED ORDER — ALUM & MAG HYDROXIDE-SIMETH 200-200-20 MG/5ML PO SUSP: 30.0000 mL | Freq: Four times a day (QID) | ORAL | Status: DC | PRN

## 2011-06-18 MED ORDER — DIPHENHYDRAMINE HCL 25 MG PO CAPS
25.0000 mg | ORAL_CAPSULE | Freq: Two times a day (BID) | ORAL | Status: DC | PRN
  Administered 2011-06-18: 25 mg via ORAL
  Filled 2011-06-18: qty 1

## 2011-06-18 MED ORDER — FLUCONAZOLE 150 MG PO TABS
150.0000 mg | ORAL_TABLET | Freq: Once | ORAL | Status: AC
  Administered 2011-06-18: 150 mg via ORAL
  Filled 2011-06-18: qty 1

## 2011-06-18 MED ORDER — SODIUM CHLORIDE 0.9 % IV SOLN: INTRAVENOUS | Status: AC

## 2011-06-18 MED ORDER — SODIUM CHLORIDE 0.9 % IV BOLUS (SEPSIS)
2000.0000 mL | Freq: Once | INTRAVENOUS | Status: AC
  Administered 2011-06-18: 1000 mL via INTRAVENOUS

## 2011-06-18 MED ORDER — INSULIN GLARGINE 100 UNIT/ML ~~LOC~~ SOLN
10.0000 [IU] | Freq: Every day | SUBCUTANEOUS | Status: DC
  Administered 2011-06-18: 10 [IU] via SUBCUTANEOUS
  Filled 2011-06-18: qty 3

## 2011-06-18 MED ORDER — SODIUM CHLORIDE 0.9 % IV SOLN: INTRAVENOUS | Status: DC

## 2011-06-18 MED ORDER — INSULIN GLARGINE 100 UNIT/ML ~~LOC~~ SOLN
10.0000 [IU] | SUBCUTANEOUS | Status: AC
  Administered 2011-06-18: 10 [IU] via SUBCUTANEOUS
  Filled 2011-06-18: qty 3

## 2011-06-18 NOTE — ED Provider Notes (Signed)
History     CSN: [id number] Arrival date & time: 06/17/2011  6:47 PM   None     Chief Complaint  Patient presents with  . Hyperglycemia    (Consider location/radiation/quality/duration/timing/severity/associated sxs/prior treatment) HPI This is a 25 year old black female who was sent to [place] from [facility]. She went there because of a two-week history of urinary frequency and frequent thirst. She thought she had a urinary tract infection but was found instead to have hyperglycemia with a sugar greater than 600 by fingerstick. She had also been complaining of itching of the vulva and a pelvic examination was done. She had abdominal pain and nausea earlier but none now. There are no known exacerbating or mitigating factors. She was not treated for hyperglycemia at [facility]. She denies dyspnea.  Past Medical History  Diagnosis Date  . Asthma     Past Surgical History  Procedure Date  . Wisdom tooth extraction     Family History  Problem Relation Age of Onset  . Asthma Mother   . Diabetes Mother   . Hypertension Mother   . Heart disease Mother   . Asthma Father   . Diabetes Father     History  Substance Use Topics  . Smoking status: Never Smoker   . Smokeless tobacco: Never Used  . Alcohol Use: Yes    OB History    Grav Para Term Preterm Abortions TAB SAB Ect Mult Living   1 1 1       1       Review of Systems  All other systems reviewed and are negative.     Allergies  Banana; Citrus; and Adhesive  Home Medications   Current Outpatient Rx  Name Route Sig Dispense Refill  . NAPROXEN SODIUM 220 MG PO TABS Oral Take 220 mg by mouth 2 (two) times daily with a meal.        BP 118/82  Pulse 68  Temp(Src) 97.7 F (36.5 C) (Oral)  Resp 20  Ht 5\' 9"  (1.753 m)  Wt 187 lb 9.6 oz (85.095 kg)  BMI 27.70 kg/m2  LMP 05/29/2011  Physical Exam General: Well-developed, well-nourished female in no acute distress; appearance consistent with age of  record HENT: normocephalic, atraumatic Eyes: pupils equal round and reactive to light; extraocular muscles intact Neck: supple Heart: regular rate and rhythm Lungs: clear to auscultation bilaterally Abdomen: soft; nontender; nondistended Extremities: No deformity; full range of motion Neurologic: Awake, alert and oriented; motor function intact in all extremities and symmetric; no facial droop Skin: Warm and dry Psychiatric: Normal mood and affect    ED Course  Procedures (including critical care time)    MDM   Nursing notes and vitals signs, including pulse oximetry, reviewed.  Summary of this visit's results, reviewed by myself:  Labs:  Results for orders placed during the hospital encounter of 06/17/11  URINALYSIS, ROUTINE W REFLEX MICROSCOPIC      Component Value Range   Color, Urine YELLOW  YELLOW    APPearance CLEAR  CLEAR    Specific Gravity, Urine <1.005 (*) 1.005 - 1.030    pH 5.0  5.0 - 8.0    Glucose, UA >1000 (*) NEGATIVE (mg/dL)   Hgb urine dipstick NEGATIVE  NEGATIVE    Bilirubin Urine NEGATIVE  NEGATIVE    Ketones, ur NEGATIVE  NEGATIVE (mg/dL)   Protein, ur NEGATIVE  NEGATIVE (mg/dL)   Urobilinogen, UA 0.2  0.0 - 1.0 (mg/dL)   Nitrite NEGATIVE  NEGATIVE  Leukocytes, UA NEGATIVE  NEGATIVE   POCT PREGNANCY, URINE      Component Value Range   Preg Test, Ur NEGATIVE    URINE MICROSCOPIC-ADD ON      Component Value Range   Squamous Epithelial / LPF RARE  RARE    WBC, UA 0-2  <3 (WBC/hpf)   Urine-Other MUCOUS PRESENT    WET PREP, GENITAL      Component Value Range   Yeast, Wet Prep NONE SEEN  NONE SEEN    Trich, Wet Prep NONE SEEN  NONE SEEN    Clue Cells, Wet Prep NONE SEEN  NONE SEEN    WBC, Wet Prep HPF POC MANY (*) NONE SEEN   GLUCOSE, CAPILLARY      Component Value Range   Glucose-Capillary >600 (*) 70 - 99 (mg/dL)  BASIC METABOLIC PANEL      Component Value Range   Sodium 124 (*) 135 - 145 (mEq/L)   Potassium 3.6  3.5 - 5.1 (mEq/L)    Chloride 90 (*) 96 - 112 (mEq/L)   CO2 24  19 - 32 (mEq/L)   Glucose, Bld 561 (*) 70 - 99 (mg/dL)   BUN 12  6 - 23 (mg/dL)   Creatinine, Ser 1.61  0.50 - 1.10 (mg/dL)   Calcium 9.4  8.4 - 09.6 (mg/dL)   GFR calc non Af Amer >90  >90 (mL/min)   GFR calc Af Amer >90  >90 (mL/min)  BLOOD GAS, ARTERIAL      Component Value Range   FIO2 0.21     pH, Arterial 7.395  7.350 - 7.400    pCO2 arterial 34.7 (*) 35.0 - 45.0 (mmHg)   pO2, Arterial 101.0 (*) 80.0 - 100.0 (mmHg)   Bicarbonate 20.8  20.0 - 24.0 (mEq/L)   TCO2 19.2  0 - 100 (mmol/L)   Acid-base deficit 3.0 (*) 0.0 - 2.0 (mmol/L)   O2 Saturation 97.6     Patient temperature 98.6     Collection site RIGHT RADIAL     Drawn by [phone]     Sample type ARTERIAL DRAW     Allens test (pass/fail) PASS  PASS    IV fluid bolus and insulin per Glucose Stabilizer initiated. Tried hospitalist to admit.         Carlisle Beers Jakarri Lesko, MD 06/18/11 0800

## 2011-06-18 NOTE — ED Provider Notes (Signed)
Chart reviewed and agree with management and plan.  

## 2011-06-18 NOTE — Progress Notes (Signed)
25 year old BF admitted with HHNK - new onset DM.  Started on IV insulin gtt (GlucoStabilizer) in ED.  Anion gap closed and GlucoStabilizer d/ced after Lantus 10 units given.  HgbA1C pending. Lengthy discussion with pt and family regarding diabetes basic skills, importance of glycemic control, monitoring, and CHO-mod diet.  Pt will need PCP to manage diabetes after discharge.  Pt verbalized willingness to view diabetes videos and write down any questions she might have regarding new diagnosis of diabetes.  Will need instruction on insulin administration, glucose monitoring and basic diet skills.  Will order insulin pen starter kit and RN to begin education.  Ordered Living Well With DM book.  Will need prescription for glucose meter at discharge. CBG 188. Will follow up tomorrow am.

## 2011-06-18 NOTE — ED Notes (Signed)
Pt reported to [facility] yesterday evening with a complaint of urinary frequency.  Pt was noted to have an increased blood glucose.  Pt transported to [facility] for treatment.

## 2011-06-18 NOTE — H&P (Signed)
PCP:   None  Chief Complaint:  Abdominal and flank discomfort  HPI: This is a 25 year old female with no known history of diabetes mellitus, she went to [facility] because she thought she was had a urinary tract infection. For the past 2 weeks she's had polyuria and abdominal and flank pain. She reports no fevers but she's had chills and she states she shakes when she's hungry. She's also had some blurred vision on and off for for the past 2 weeks. She went to [facility] were UA revealed glucose greater than 1000. Patient reports no shortness of breath, no wheezing, no cough, no nausea, no vomiting, no diarrhea. She does report polydipsia, no weight loss. Fingerstick blood sugars reveals glucose greater than 500. She was transported to [facility] long ER. The hospitalist service was called for admission. History provided by patient and her parents who are at her bedside.  Review of Systems: Positives bolded  The patient denies anorexia, fever, weight loss,, vision loss, decreased hearing, hoarseness, chest pain, syncope, dyspnea on exertion, peripheral edema, balance deficits, hemoptysis, abdominal pain, melena, hematochezia, severe indigestion/heartburn, hematuria, incontinence, genital sores, muscle weakness, suspicious skin lesions, transient blindness, difficulty walking, depression, unusual weight change, abnormal bleeding, enlarged lymph nodes, angioedema, and breast masses.  Past Medical History: Past Medical History  Diagnosis Date  . Asthma    Past Surgical History  Procedure Date  . Wisdom tooth extraction     Medications: Prior to Admission medications   Medication Sig Start Date End Date Taking? Authorizing Provider  naproxen sodium (ANAPROX) 220 MG tablet Take 220 mg by mouth 2 (two) times daily with a meal.     Yes Historical Provider, MD    Allergies:   Allergies  Allergen Reactions  . Banana Anaphylaxis  . Citrus     Gums bleed  . Adhesive (Tape) Rash     Social History:  reports that she has never smoked. She has never used smokeless tobacco. She reports that she drinks alcohol. She reports that she does not use illicit drugs.  Family History: Family History  Problem Relation Age of Onset  . Asthma Mother   . Diabetes Mother   . Hypertension Mother   . Heart disease Mother   . Asthma Father   . Diabetes Father     Physical Exam: Filed Vitals:   06/18/11 0345 06/18/11 0400 06/18/11 0415 06/18/11 0430  BP:  114/73    Pulse: 93 88 87 90  Temp:      TempSrc:      Resp:      Height:      Weight:      SpO2: 100% 100% 100% 100%    General:  Alert and oriented times three, well developed and nourished, no acute distress Eyes: PERRLA, pink conjunctiva, no scleral icterus ENT: Moist oral mucosa, neck supple, no thyromegaly Lungs: clear to ascultation, no wheeze, no crackles, no use of accessory muscles Cardiovascular: regular rate and rhythm, no regurgitation, no gallops, no murmurs. No carotid bruits, no JVD Abdomen: soft, positive BS, non-tender, non-distended, no organomegaly, not an acute abdomen GU: not examined Neuro: CN II - XII grossly intact, sensation intact Musculoskeletal: strength 5/5 all extremities, no clubbing, cyanosis or edema Skin: no rash, no subcutaneous crepitation, no decubitus Psych: appropriate patient   Labs on Admission:   [facility] 06/17/11 2243  NA 124*  K 3.6  CL 90*  CO2 24  GLUCOSE 561*  BUN 12  CREATININE 0.79  CALCIUM 9.4  MG --  PHOS --  Results for YLONDA, STORR (MRN [id number]) as of 06/18/2011 04:43  Ref. Range 06/17/2011 21:05  Yeast, Wet Prep Latest Range: NONE SEEN  NONE SEEN  Trich, Wet Prep Latest Range: NONE SEEN  NONE SEEN  Clue Cells, Wet Prep Latest Range: NONE SEEN  NONE SEEN  WBC, Wet Prep HPF POC Latest Range: NONE SEEN  MANY (A)  Results for ASTRAEA, GAUGHRAN (MRN [id number]) as of 06/18/2011 04:43  Ref. Range 06/17/2011 18:59  Color, Urine Latest Range:  YELLOW  YELLOW  APPearance Latest Range: CLEAR  CLEAR  Specific Gravity, Urine Latest Range: 1.005-1.030  <1.005 (L)  pH Latest Range: 5.0-8.0  5.0  Glucose, UA Latest Range: NEGATIVE mg/dL >4696 (A)  Bilirubin Urine Latest Range: NEGATIVE  NEGATIVE  Ketones, ur Latest Range: NEGATIVE mg/dL NEGATIVE  Protein Latest Range: NEGATIVE mg/dL NEGATIVE  Urobilinogen, UA Latest Range: 0.0-1.0 mg/dL 0.2  Nitrite Latest Range: NEGATIVE  NEGATIVE  Leukocytes, UA Latest Range: NEGATIVE  NEGATIVE  Urine-Other No range found MUCOUS PRESENT  WBC, UA Latest Range: <3 WBC/hpf 0-2  Squamous Epithelial / LPF Latest Range: RARE  RARE   No results found for this basename: AST:2,ALT:2,ALKPHOS:2,BILITOT:2,PROT:2,ALBUMIN:2 in the last 72 hours No results found for this basename: LIPASE:2,AMYLASE:2 in the last 72 hours No results found for this basename: WBC:2,NEUTROABS:2,HGB:2,HCT:2,MCV:2,PLT:2 in the last 72 hours No results found for this basename: CKTOTAL:3,CKMB:3,CKMBINDEX:3,TROPONINI:3 in the last 72 hours No results found for this basename: TSH,T4TOTAL,FREET3,T3FREE,THYROIDAB in the last 72 hours No results found for this basename: VITAMINB12:2,FOLATE:2,FERRITIN:2,TIBC:2,IRON:2,RETICCTPCT:2 in the last 72 hours  Radiological Exams on Admission: No results found.  Assessment/Plan Present on Admission:  .Diabetes mellitus non-ketotic hyperglycemia .Hyponatremia Admit to step down Patient on insulin  Diabetic education ADA diet Convert to sliding scale insulin once of insulin drip Hyponatremia  likely related to hyperglycemia IV fluid resuscitation started, repeat BMP ordered Depression Migraines  Stable   Full code DVT prophylaxis Team 2/Dr. Darnelle Catalan  Time in 4:15 AM Time out 4:50 AM     Livia Tarr 06/18/2011, 4:41 AM

## 2011-06-18 NOTE — ED Notes (Signed)
Pt's glucose was 118. Glucostabilizer advised change to 1.2

## 2011-06-18 NOTE — ED Notes (Signed)
ZOX:WRUE<AV> Expected date:06/17/11<BR> Expected time:12:21 AM<BR> Means of arrival:Ambulance<BR> Comments:<BR> New onset DM

## 2011-06-18 NOTE — Progress Notes (Addendum)
Subjective: No complains.  Objective: Filed Vitals:   06/18/11 0415 06/18/11 0430 06/18/11 0641 06/18/11 0930  BP:   129/73 115/71  Pulse: 87 90 81 72  Temp:    98.1 F (36.7 C)  TempSrc:    Oral  Resp:   14 17  Height:      Weight:      SpO2: 100% 100% 100% 99%   Weight change:   Intake/Output Summary (Last 24 hours) at 06/18/11 1013 Last data filed at 06/18/11 0900  Gross per 24 hour  Intake      2 ml  Output    275 ml  Net   -273 ml    General: Alert, awake, oriented x3, in no acute distress.  HEENT: No bruits, no goiter.  Heart: Regular rate and rhythm, without murmurs, rubs, gallops.  Lungs: Crackles left side, bilateral air movement.  Abdomen: Soft, nontender, nondistended, positive bowel sounds.  Neuro: Grossly intact, nonfocal.   Lab Results:  [facility] 06/17/11 2243  NA 124*  K 3.6  CL 90*  CO2 24  GLUCOSE 561*  BUN 12  CREATININE 0.79  CALCIUM 9.4  MG --  PHOS --    Micro Results: Recent Results (from the past 240 hour(s))  WET PREP, GENITAL     Status: Abnormal   Collection Time   06/17/11  9:05 PM      Component Value Range Status Comment   Yeast, Wet Prep NONE SEEN  NONE SEEN  Final    Trich, Wet Prep NONE SEEN  NONE SEEN  Final    Clue Cells, Wet Prep NONE SEEN  NONE SEEN  Final    WBC, Wet Prep HPF POC MANY (*) NONE SEEN  Final MANY BACTERIA SEEN    Studies/Results: No results found.  Medications: I have reviewed the patient's current medications.   Principal Problem:  *Hyponatremia Active Problems:  Diabetes mellitus  Migraine    Assessment and plan: -Start Lantus and SSI d/c glucomander 1 hour after giving insulin. Stop D5W once the patient has eaten. Cont B-met ever 4 hours to monitor NA. Check a anti GAD, she is probably a Type 1. Kidneys stable. Anion Close bicarbonate of 20.  -give bolus IV fluids cont NS check Basic metabolic panel every 4 hours. D/c D5 w once the patient is eating . There is also a component of  pseudohyponatremia in the Basic metabolic panel.  - tranfers to regular floor.   LOS: 1 day   Marinda Elk M.D. Pager: [phone] [facility] 06/18/2011, 10:13 AM

## 2011-06-18 NOTE — Plan of Care (Signed)
Problem: Food- and Nutrition-Related Knowledge Deficit (NB-1.1) Goal: Nutrition education Formal process to instruct or train a patient/client in a skill or to impart knowledge to help patients/clients voluntarily manage or modify food choices and eating behavior to maintain or improve health.  Outcome: Adequate for Discharge Pt sleeping at time of visit.  Discussed goals of management with family.  Provided family with handout they will share with pt.  Described process of ongoing education during stay and hopefully outpatient education as well.  Family verbalized understanding.  Expect good compliance. RD contact information provided and encouraged family to request return visit if pt with questions when she wakes.

## 2011-06-18 NOTE — ED Notes (Signed)
POC CBG 362 mg/dL

## 2011-06-18 NOTE — ED Notes (Signed)
CBG 300. 

## 2011-06-19 LAB — GLUCOSE, CAPILLARY
Glucose-Capillary: 362 mg/dL — ABNORMAL HIGH (ref 70–99)
Glucose-Capillary: 243 mg/dL — ABNORMAL HIGH (ref 70–99)
Glucose-Capillary: 329 mg/dL — ABNORMAL HIGH (ref 70–99)
Glucose-Capillary: 210 mg/dL — ABNORMAL HIGH (ref 70–99)

## 2011-06-19 LAB — BASIC METABOLIC PANEL
BUN: 11 mg/dL (ref 6–23)
CO2: 25 mEq/L (ref 19–32)
GFR calc non Af Amer: >90 mL/min (ref >90)
Glucose, Bld: 269 mg/dL — ABNORMAL HIGH (ref 70–99)
Potassium: 4.1 mEq/L (ref 3.5–5.1)
Sodium: 135 mEq/L (ref 135–145)

## 2011-06-19 MED ORDER — INSULIN ASPART 100 UNIT/ML ~~LOC~~ SOLN: SUBCUTANEOUS | Status: DC

## 2011-06-19 MED ORDER — INSULIN GLARGINE 100 UNIT/ML ~~LOC~~ SOLN: 20.0000 [IU] | Freq: Every day | SUBCUTANEOUS | Status: DC

## 2011-06-19 MED ORDER — INSULIN ASPART 100 UNIT/ML ~~LOC~~ SOLN: 0.0000 [IU] | Freq: Three times a day (TID) | SUBCUTANEOUS | Status: DC

## 2011-06-19 NOTE — Discharge Summary (Signed)
Admit date: 06/17/2011 Discharge date: 06/19/2011  Primary Care Physician:  No primary provider on file.   Discharge Diagnoses:   Active Hospital Problems  Diagnoses Date Noted   . Diabetes mellitus with DKA 06/18/2011   . Migraine 06/18/2011     Resolved Hospital Problems  Diagnoses Date Noted Date Resolved  . Hyponatremia 06/18/2011 06/19/2011     DISCHARGE MEDICATION: Current Discharge Medication List    START taking these medications   Details  insulin aspart (NOVOLOG) 100 UNIT/ML injection Inject 0-15 Units into the skin 3 (three) times daily with meals. Qty: 1 vial, Refills: 0    insulin glargine (LANTUS) 100 UNIT/ML injection Inject 20 Units into the skin at bedtime. Qty: 10 mL, Refills: 3      CONTINUE these medications which have NOT CHANGED   Details  naproxen sodium (ANAPROX) 220 MG tablet Take 220 mg by mouth 2 (two) times daily with a meal.         SIGNIFICANT DIAGNOSTIC STUDIES:  No results found.    Recent Results (from the past 240 hour(s))  WET PREP, GENITAL     Status: Abnormal   Collection Time   06/17/11  9:05 PM      Component Value Range Status Comment   Yeast, Wet Prep NONE SEEN  NONE SEEN  Final    Trich, Wet Prep NONE SEEN  NONE SEEN  Final    Clue Cells, Wet Prep NONE SEEN  NONE SEEN  Final    WBC, Wet Prep HPF POC MANY (*) NONE SEEN  Final MANY BACTERIA SEEN  MRSA PCR SCREENING     Status: Normal   Collection Time   06/18/11  9:54 AM      Component Value Range Status Comment   MRSA by PCR NEGATIVE  NEGATIVE  Final     BRIEF ADMITTING H & P: This is a 25 year old female with no known history of diabetes mellitus, she went to [facility] because she thought she was had a urinary tract infection. For the past 2 weeks she's had polyuria and abdominal and flank pain. She reports no fevers but she's had chills and she states she shakes when she's hungry. She's also had some blurred vision on and off for for the past 2 weeks. She went to  [facility] were UA revealed glucose greater than 1000. Patient reports no shortness of breath, no wheezing, no cough, no nausea, no vomiting, no diarrhea. She does report polydipsia, no weight loss. Fingerstick blood sugars reveals glucose greater than 500. She was transported to [facility] long ER. The hospitalist service was called for admission. History provided by patient and her parents who are at her bedside.   Active Hospital Problems  Diagnoses Date Noted   . Diabetes mellitus with DKA: She was admitted to the step down unit was started on Glucommander, she received over 5 L of normal saline for hydration. No signs of infection or cause that would promote DKA were found. She was switched to Lantus 20 units plus sliding scale which he would continue to take as an outpatient. Her hemoglobin A1c on admission was 10.6. She will followup with Dr. Elmyra Ricks as an outpatient in a week. At this time her blood pressure is at goal less than 120/80. This OB have to followup with her PCP as an outpatient.  06/18/2011   . Migraine: Does resolve with treatment of the above.  06/18/2011     Resolved Hospital Problems  Diagnoses Date Noted Date  Resolved  . Hyponatremia: Probably a component also pseudohyponatremia. With correction of her DKA and her hyperglycemia this corrected.  06/18/2011 06/19/2011     Disposition and Follow-up:  Discharge Orders    Future Orders Please Complete By Expires   Diet - low sodium heart healthy      Increase activity slowly        Follow-up Information    Follow up with Harvette Jenkins. Make an appointment in 1 week.   Contact information:   [address]  [place], [place] [address] [phone]          DISCHARGE EXAM:  General: Alert, awake, oriented x3, in no acute distress.  HEENT: No bruits, no goiter.  Heart: Regular rate and rhythm, without murmurs, rubs, gallops.  Lungs: Crackles left side, bilateral air movement.  Abdomen: Soft,  nontender, nondistended, positive bowel sounds.  Neuro: Grossly intact, nonfocal.  Blood pressure 115/79, pulse 93, temperature 98.1 F (36.7 C), temperature source Oral, resp. rate 16, height 5\' 9"  (1.753 m), weight 86.2 kg (190 lb 0.6 oz), last menstrual period 05/29/2011, SpO2 97.00%.   Basename 06/19/11 0353 06/18/11 1102  NA 135 134*  K 4.1 3.6  CL 101 105  CO2 25 21  GLUCOSE 269* 163*  BUN 11 16  CREATININE 0.89 0.68  CALCIUM 9.6 8.9  MG -- --  PHOS -- --   No results found for this basename: AST:2,ALT:2,ALKPHOS:2,BILITOT:2,PROT:2,ALBUMIN:2 in the last 72 hours No results found for this basename: LIPASE:2,AMYLASE:2 in the last 72 hours  Basename 06/18/11 1102  WBC 4.9  NEUTROABS --  HGB 10.4*  HCT 31.7*  MCV 70.3*  PLT 166    Signed: Marinda Elk M.D. 06/19/2011, 12:17 PM

## 2011-06-19 NOTE — Progress Notes (Signed)
Reviewed basic diabetes education with pt and family.  Discussed hypoglycemia, diet, exercise, monitoring and insulin admin.  Pt gave insulin injection this am w/o difficulty.  Gave hypoglycemia symptoms material, fast food guide and answered questions.  Pt to f/u with Della Goo next week for management of blood sugars.  Received referral for OP Diabetes Ed consult.

## 2011-06-19 NOTE — Progress Notes (Signed)
UR complete 

## 2011-06-19 NOTE — Progress Notes (Signed)
06-19-11 Spoke with patient at bedside. Plans to follow up with Dr. Della Goo as a PCP. Did not want to f/u with indigent clinic in [facility] as she currently works and will apply for insurance in next enrollment period. [employer] website info given, prescription discount card given to patient. Gave number for her to call Dr. Lovell Sheehan to call to make appt. Pt will need cheaper insulin and etc as she does not have insurance and will have to pay out of pocket for meds. No further needs assessed.   [address] RN,BSN, [place]  [phone]

## 2011-06-21 NOTE — Progress Notes (Signed)
Late entry for 06-19-11. Pt was assisted with indigent funds for her insulin thru [facility] pharmacy.   Raiford Noble, RN, BSN, [place]  [phone]

## 2011-07-25 ENCOUNTER — Encounter: Payer: Self-pay | Admitting: Advanced Practice Midwife

## 2011-07-25 ENCOUNTER — Ambulatory Visit (INDEPENDENT_AMBULATORY_CARE_PROVIDER_SITE_OTHER): Payer: Self-pay | Admitting: Advanced Practice Midwife

## 2011-07-25 VITALS — BP 109/76 | HR 63 | Temp 97.9°F | Ht 69.0 in | Wt 195.0 lb

## 2011-07-25 DIAGNOSIS — N898 Other specified noninflammatory disorders of vagina: Secondary | ICD-10-CM

## 2011-07-25 DIAGNOSIS — Z113 Encounter for screening for infections with a predominantly sexual mode of transmission: Secondary | ICD-10-CM

## 2011-07-25 LAB — POCT URINALYSIS DIP (DEVICE)
Nitrite: NEGATIVE
Protein, ur: NEGATIVE mg/dL
Urobilinogen, UA: 0.2 mg/dL (ref 0.0–1.0)
pH: 5.5 (ref 5.0–8.0)

## 2011-07-25 NOTE — Progress Notes (Signed)
  Subjective:    Christie [name] is a 26 y.o. female who presents for sexually transmitted disease check. Sexual history reviewed with the patient. STI Exposure: denies knowledge of risky exposure and current sexual partner thought to have history of unknown STD. Previous history of STI none. Current symptoms vaginal discharge: normal and physiologic and malodorous. Contraception: not asked   States she is "just paranoid and want to be checked for everything".  Patient was seen by me in MAU not long ago with c/o urinary frequency and was found to be severely hyperglycemic with BS in 600s.  She was taken to [facility] and stayed there for a week diagnosed with diabetes.    Menstrual History: OB History    Grav Para Term Preterm Abortions TAB SAB Ect Mult Living   1 1 1       1       Menarche age:  Patient's last menstrual period was 06/26/2011.    The following portions of the patient's history were reviewed and updated as appropriate: allergies, current medications, past family history, past medical history, past social history, past surgical history and problem list.  Review of Systems Pertinent items are noted in HPI.    Objective:    BP 109/76  Pulse 63  Temp(Src) 97.9 F (36.6 C) (Oral)  Ht 5\' 9"  (1.753 m)  Wt 195 lb (88.451 kg)  BMI 28.80 kg/m2  LMP 06/26/2011 General:   alert, cooperative and no distress  Lymph Nodes:   Cervical, supraclavicular, and axillary nodes normal. and n/a  Pelvis:  Vulva and vagina appear normal. Bimanual exam reveals normal uterus and adnexa. External genitalia: normal general appearance Vaginal: normal mucosa without prolapse or lesions Cervix: normal appearance Adnexa: normal bimanual exam Uterus: normal single, nontender and mid-position  Cultures:  GC and Chlamydia genprobes and wet prep, HepB, HIV, RPR     Assessment:    Possible STD exposure    Plan:    Appropriate educational material was distributed See orders for STD cultures  and assays Pt will call for results RTC PRN

## 2011-07-25 NOTE — Progress Notes (Signed)
Pt. C/o urinary urgency, but then"nothing comes out"- urinalysis obtained

## 2011-07-25 NOTE — Patient Instructions (Signed)
Safer Sex Your caregiver wants you to have this information about the infections that can be transmitted from sexual contact and how to prevent them. The idea behind safer sex is that you can be sexually active, and at the same time reduce the risk of giving or getting a sexually transmitted disease (STD). Every person should be aware of how to prevent him or herself and his or her sex partner from getting an STD. CAUSES OF STDS STDs are transmitted by sharing body fluids, which contain viruses and bacteria. The following fluids all transmit infections during sexual intercourse and sex acts:  Semen.   Saliva.   Urine.   Blood.   Vaginal mucus.  Examples of STDs include:  Chlamydia.   Gonorrhea.   Genital herpes.   Hepatitis B.   Human immunodeficiency virus or acquired immunodeficiency syndrome (HIV or AIDS).   Syphilis.   Trichomonas.   Pubic lice.   Human papillomavirus (HPV), which may include:   Genital warts.   Cervical dysplasia.   Cervical cancer (can develop with certain types of HPV).  SYMPTOMS  Sexual diseases often cause few or no symptoms until they are advanced, so a person can be infected and spread the infection without knowing it. Some STDs respond to treatment very well. Others, like HIV and herpes, cannot be cured, but are treated to reduce their effects. Specific symptoms include:  Abnormal vaginal discharge.   Irritation or itching in and around the vagina, and in the pubic hair.   Pain during sexual intercourse.   Bleeding during sexual intercourse.   Pelvic or abdominal pain.   Fever.   Growths in and around the vagina.   An ulcer in or around the vagina.   Swollen glands in the groin area.  DIAGNOSIS   Blood tests.   Pap test.   Culture test of abnormal vaginal discharge.   A test that applies a solution and examines the cervix with a lighted magnifying scope (colposcopy).   A test that examines the pelvis with a lighted  tube, through a small incision (laparoscopy).  TREATMENT  The treatment will depend on the cause of the STD.  Antibiotic treatment by injection, oral, creams, or suppositories in the vagina.   Over-the-counter medicated shampoo, to get rid of pubic lice.   Removing or treating growths with medicine, freezing, burning (electrocautery), or surgery.   Surgery treatment for HPV of the cervix.   Supportive medicines for herpes, HIV, AIDS, and hepatitis.  Being careful cannot eliminate all risk of infection, but sex can be made much safer. Safe sexual practices include body massage and gentle touching. Masturbation is safe, as long as body fluids do not contact skin that has sores or cuts. Dry kissing and oral sex on a man wearing a latex condom or on a woman wearing a female condom is also safe. Slightly less safe is intercourse while the man wears a latex condom or wet kissing. It is also safer to have one sex partner that you know is not having sex with anyone else. LENGTH OF ILLNESS An STD might be treated and cured in a week, sometimes a month, or more. And it can linger with symptoms for many years. STDs can also cause damage to the female organs. This can cause chronic pain, infertility, and recurrence of the STD, especially herpes, hepatitis, HIV, and HPV. HOME CARE INSTRUCTIONS AND PREVENTION  Alcohol and recreational drugs are often the reason given for not practicing safer sex. These substances affect   your judgment. Alcohol and recreational drugs can also impair your immune system, making you more vulnerable to disease.   Do not engage in risky and dangerous sexual practices, including:   Vaginal or anal sex without a condom.   Oral sex on a man without a condom.   Oral sex on a woman without a female condom.   Using saliva to lubricate a condom.   Any other sexual contact in which body fluids or blood from one partner contact the other partner.   You should use only latex  condoms for men and water soluble lubricants. Petroleum based lubricants or oils used to lubricate a condom will weaken the condom and increase the chance that it will break.   Think very carefully before having sex with anyone who is high risk for STDs and HIV. This includes IV drug users, people with multiple sexual partners, or people who have had an STD, or a positive hepatitis or HIV blood test.   Remember that even if your partner has had only one previous partner, their previous partner might have had multiple partners. If so, you are at high risk of being exposed to an STD. You and your sex partner should be the only sex partners with each other, with no one else involved.   A vaccine is available for hepatitis B and HPV through your caregiver or the Public Health Department. Everyone should be vaccinated with these vaccines.   Avoid risky sex practices. Sex acts that can break the skin make you more likely to get an STD.  SEEK MEDICAL CARE IF:   If you think you have an STD, even if you do not have any symptoms. Contact your caregiver for evaluation and treatment, if needed.   You think or know your sex partner has acquired an STD.   You have any of the symptoms mentioned above.  Document Released: 08/02/2004 Document Revised: 03/07/2011 Document Reviewed: 05/25/2009 [employer] Patient Information 2012 [employer]. 

## 2011-07-26 LAB — GC/CHLAMYDIA PROBE AMP, GENITAL: Chlamydia, DNA Probe: NEGATIVE

## 2011-07-26 LAB — HEPATITIS B SURFACE ANTIGEN: Hepatitis B Surface Ag: NEGATIVE

## 2011-07-26 LAB — WET PREP, GENITAL: Trich, Wet Prep: NONE SEEN

## 2011-07-26 LAB — RPR

## 2011-07-26 LAB — HIV ANTIBODY (ROUTINE TESTING W REFLEX): HIV: NONREACTIVE

## 2011-10-01 ENCOUNTER — Emergency Department (HOSPITAL_BASED_OUTPATIENT_CLINIC_OR_DEPARTMENT_OTHER)
Admission: EM | Admit: 2011-10-01 | Discharge: 2011-10-01 | Disposition: A | Discharge status: Home Health | Payer: Self-pay | Attending: Emergency Medicine | Admitting: Emergency Medicine

## 2011-10-01 ENCOUNTER — Encounter (HOSPITAL_BASED_OUTPATIENT_CLINIC_OR_DEPARTMENT_OTHER): Payer: Self-pay

## 2011-10-01 DIAGNOSIS — D649 Anemia, unspecified: Secondary | ICD-10-CM | POA: Insufficient documentation

## 2011-10-01 DIAGNOSIS — F17200 Nicotine dependence, unspecified, uncomplicated: Secondary | ICD-10-CM | POA: Insufficient documentation

## 2011-10-01 DIAGNOSIS — E119 Type 2 diabetes mellitus without complications: Secondary | ICD-10-CM | POA: Insufficient documentation

## 2011-10-01 DIAGNOSIS — R112 Nausea with vomiting, unspecified: Secondary | ICD-10-CM

## 2011-10-01 DIAGNOSIS — R070 Pain in throat: Secondary | ICD-10-CM | POA: Insufficient documentation

## 2011-10-01 DIAGNOSIS — J029 Acute pharyngitis, unspecified: Secondary | ICD-10-CM

## 2011-10-01 DIAGNOSIS — Z794 Long term (current) use of insulin: Secondary | ICD-10-CM | POA: Insufficient documentation

## 2011-10-01 NOTE — ED Provider Notes (Signed)
History     CSN: [id number]  Arrival date & time 10/01/11  4098   First MD Initiated Contact with Patient 10/01/11 [phone]      Chief Complaint  Patient presents with  . Sore Throat  . Nausea    (Consider location/radiation/quality/duration/timing/severity/associated sxs/prior treatment) HPI Comments: Patient presents with a two-week history of sore throat and laryngitis.  She notes that today is the first day her voice is been much improved and she is able to talk better.  She wanted to get checked for strep throat because it had been 2 weeks of symptoms.  No specific fevers at home.  Patient also notes that last night she started having some nausea and vomiting without significant abdominal pain.  Her son was just here over the weekend for a gastroenteritis and she thought she should get checked for that as well.  She's noted maybe some slightly loose stools but no specific diarrhea.  Patient states she's been taking her insulin as prescribed and that her sugars have been normal for her at home.  Patient is a 26 y.o. female presenting with pharyngitis. The history is provided by the patient. No language interpreter was used.  Sore Throat Pertinent negatives include no chest pain, no abdominal pain, no headaches and no shortness of breath.    Past Medical History  Diagnosis Date  . Asthma   . Hx of migraines   . Anemia   . Depression   . Fx ankle     history of left ankle fx at age 73  . Diabetes mellitus     Past Surgical History  Procedure Date  . Wisdom tooth extraction     Family History  Problem Relation Age of Onset  . Asthma Mother   . Diabetes Mother   . Hypertension Mother   . Heart disease Mother   . Asthma Father   . Diabetes Father     History  Substance Use Topics  . Smoking status: Current Some Day Smoker    Types: Cigarettes  . Smokeless tobacco: Never Used  . Alcohol Use: Yes     occasional    OB History    Grav Para Term Preterm Abortions TAB  SAB Ect Mult Living   1 1 1       1       Review of Systems  Constitutional: Negative.  Negative for fever and chills.  HENT: Positive for sore throat.   Eyes: Negative.  Negative for discharge and redness.  Respiratory: Negative.  Negative for cough and shortness of breath.   Cardiovascular: Negative.  Negative for chest pain.  Gastrointestinal: Positive for nausea and vomiting. Negative for abdominal pain and diarrhea.  Genitourinary: Negative.  Negative for dysuria and vaginal discharge.  Musculoskeletal: Negative.  Negative for back pain.  Skin: Negative.  Negative for color change and rash.  Neurological: Negative.  Negative for syncope and headaches.  Hematological: Negative.  Negative for adenopathy.  Psychiatric/Behavioral: Negative.  Negative for confusion.  All other systems reviewed and are negative.    Allergies  Banana; Citrus; and Adhesive  Home Medications   Current Outpatient Rx  Name Route Sig Dispense Refill  . INSULIN ASPART 100 UNIT/ML [place] SOLN  CBG < 70: Drink juice;  CBG 70 - 120: 0 units: CBG 121 - 150: 2 units; CBG 151 - 200: 3 units; CBG 201 - 250: 5 units; CBG 251 - 300: 8 units;CBG 301 - 350: 11 units; CBG 351 - 400: 15 units;  CBG > 400 Call MD      1 vial 0  . INSULIN GLARGINE 100 UNIT/ML [place] SOLN Subcutaneous Inject 20 Units into the skin at bedtime. 10 mL 12  . NAPROXEN SODIUM 220 MG PO TABS Oral Take 220 mg by mouth 2 (two) times daily with a meal.        BP 116/84  Pulse 119  Temp(Src) 98.2 F (36.8 C) (Oral)  Resp 16  Ht 5\' 9"  (1.753 m)  Wt 196 lb (88.905 kg)  BMI 28.94 kg/m2  SpO2 99%  LMP 09/28/2011  Physical Exam  Nursing note and vitals reviewed. Constitutional: She is oriented to person, place, and time. She appears well-developed and well-nourished.  Non-toxic appearance. She does not have a sickly appearance.  HENT:  Head: Normocephalic and atraumatic.  Mouth/Throat: Oropharynx is clear and moist. No oropharyngeal exudate.        Mild erythema to posterior oropharynx with no tonsillar swelling, exudates and uvula is midline.  Eyes: Conjunctivae, EOM and lids are normal. Pupils are equal, round, and reactive to light. No scleral icterus.  Neck: Trachea normal and normal range of motion. Neck supple.  Cardiovascular: Normal rate, regular rhythm and normal heart sounds.   Pulmonary/Chest: Effort normal and breath sounds normal. No respiratory distress. She has no wheezes. She has no rales.  Abdominal: Soft. Normal appearance. There is no tenderness. There is no rebound, no guarding and no CVA tenderness.  Musculoskeletal: Normal range of motion.  Neurological: She is alert and oriented to person, place, and time. She has normal strength.  Skin: Skin is warm, dry and intact. No rash noted.  Psychiatric: She has a normal mood and affect. Her behavior is normal. Judgment and thought content normal.    ED Course  Procedures (including critical care time)  Labs Reviewed - No data to display No results found.   No diagnosis found.    MDM  Patient with likely viral syndrome that she is recovering from with her sore throat.  Patient does not have a fever here show signs of strep in her exam.  Patient may also have a mild viral gastritis or gastroenteritis that she acquired from her son.  She appears well-hydrated at this time.  On my recheck of her heart rate after no intervention the patient is a heart rate of 72.  Patient notes that her sugars were fine I do not a concern that this point in time the patient isn't in DKA she appears well hydrated and clinically well and her heart rate is truly in the low 70s.  Patient has been advised if she has problems controlling her sugars or other persistent issues to seek reevaluation.        Nat Christen, MD 10/01/11 [phone]

## 2011-10-01 NOTE — Discharge Instructions (Signed)
Nausea and Vomiting Nausea is a sick feeling that often comes before throwing up (vomiting). Vomiting is a reflex where stomach contents come out of your mouth. Vomiting can cause severe loss of body fluids (dehydration). Children and elderly adults can become dehydrated quickly, especially if they also have diarrhea. Nausea and vomiting are symptoms of a condition or disease. It is important to find the cause of your symptoms. CAUSES   Direct irritation of the stomach lining. This irritation can result from increased acid production (gastroesophageal reflux disease), infection, food poisoning, taking certain medicines (such as nonsteroidal anti-inflammatory drugs), alcohol use, or tobacco use.   Signals from the brain.These signals could be caused by a headache, heat exposure, an inner ear disturbance, increased pressure in the brain from injury, infection, a tumor, or a concussion, pain, emotional stimulus, or metabolic problems.   An obstruction in the gastrointestinal tract (bowel obstruction).   Illnesses such as diabetes, hepatitis, gallbladder problems, appendicitis, kidney problems, cancer, sepsis, atypical symptoms of a heart attack, or eating disorders.   Medical treatments such as chemotherapy and radiation.   Receiving medicine that makes you sleep (general anesthetic) during surgery.  DIAGNOSIS Your caregiver may ask for tests to be done if the problems do not improve after a few days. Tests may also be done if symptoms are severe or if the reason for the nausea and vomiting is not clear. Tests may include:  Urine tests.   Blood tests.   Stool tests.   Cultures (to look for evidence of infection).   X-rays or other imaging studies.  Test results can help your caregiver make decisions about treatment or the need for additional tests. TREATMENT You need to stay well hydrated. Drink frequently but in small amounts.You may wish to drink water, sports drinks, clear broth, or  eat frozen ice pops or gelatin dessert to help stay hydrated.When you eat, eating slowly may help prevent nausea.There are also some antinausea medicines that may help prevent nausea. HOME CARE INSTRUCTIONS   Take all medicine as directed by your caregiver.   If you do not have an appetite, do not force yourself to eat. However, you must continue to drink fluids.   If you have an appetite, eat a normal diet unless your caregiver tells you differently.   Eat a variety of complex carbohydrates (rice, wheat, potatoes, bread), lean meats, yogurt, fruits, and vegetables.   Avoid high-fat foods because they are more difficult to digest.   Drink enough water and fluids to keep your urine clear or pale yellow.   If you are dehydrated, ask your caregiver for specific rehydration instructions. Signs of dehydration may include:   Severe thirst.   Dry lips and mouth.   Dizziness.   Dark urine.   Decreasing urine frequency and amount.   Confusion.   Rapid breathing or pulse.  SEEK IMMEDIATE MEDICAL CARE IF:   You have blood or brown flecks (like coffee grounds) in your vomit.   You have black or bloody stools.   You have a severe headache or stiff neck.   You are confused.   You have severe abdominal pain.   You have chest pain or trouble breathing.   You do not urinate at least once every 8 hours.   You develop cold or clammy skin.   You continue to vomit for longer than 24 to 48 hours.   You have a fever.  MAKE SURE YOU:   Understand these instructions.   Will watch your  condition.   Will get help right away if you are not doing well or get worse.  Document Released: 06/25/2005 Document Revised: 06/14/2011 Document Reviewed: 11/22/2010 [facility] Patient Information 2012 [place], [place].Sore Throat Sore throats may be caused by bacteria and viruses. They may also be caused by:  Smoking.   Pollution.   Allergies.  If a sore throat is due to strep infection (a  bacterial infection), you may need:  A throat swab.   A culture test to verify the strep infection.  You will need one of these:  An antibiotic shot.   Oral medicine for a full 10 days.  Strep infection is very contagious. A doctor should check any close contacts who have a sore throat or fever. A sore throat caused by a virus infection will usually last only 3-4 days. Antibiotics will not treat a viral sore throat.  Infectious mononucleosis (a viral disease), however, can cause a sore throat that lasts for up to 3 weeks. Mononucleosis can be diagnosed with blood tests. You must have been sick for at least 1 week in order for the test to give accurate results. HOME CARE INSTRUCTIONS   To treat a sore throat, take mild pain medicine.   Increase your fluids.   Eat a soft diet.   Do not smoke.   Gargling with warm water or salt water (1 tsp. salt in 8 oz. water) can be helpful.   Try throat sprays or lozenges or sucking on hard candy to ease the symptoms.  Call your doctor if your sore throat lasts longer than 1 week.  SEEK IMMEDIATE MEDICAL CARE IF:  You have difficulty breathing.   You have increased swelling in the throat.   You have pain so severe that you are unable to swallow fluids or your saliva.   You have a severe headache, a high fever, vomiting, or a red rash.  Document Released: 08/02/2004 Document Revised: 06/14/2011 Document Reviewed: 06/12/2007 [facility] Patient Information 2012 [place], [place].

## 2011-10-01 NOTE — ED Notes (Signed)
Pt reports sore throat, hoarseness, cough and nausea.

## 2011-10-18 ENCOUNTER — Ambulatory Visit (INDEPENDENT_AMBULATORY_CARE_PROVIDER_SITE_OTHER): Payer: Self-pay | Admitting: Obstetrics & Gynecology

## 2011-10-18 ENCOUNTER — Encounter: Payer: Self-pay | Admitting: Obstetrics & Gynecology

## 2011-10-18 VITALS — BP 104/71 | HR 78 | Temp 97.8°F | Ht 69.0 in | Wt 192.1 lb

## 2011-10-18 DIAGNOSIS — N898 Other specified noninflammatory disorders of vagina: Secondary | ICD-10-CM

## 2011-10-18 MED ORDER — METRONIDAZOLE 500 MG PO TABS: 500.0000 mg | ORAL_TABLET | Freq: Three times a day (TID) | ORAL | Status: AC

## 2011-10-18 MED ORDER — DOXYCYCLINE HYCLATE 50 MG PO CAPS: 100.0000 mg | ORAL_CAPSULE | Freq: Two times a day (BID) | ORAL | Status: AC

## 2011-10-18 NOTE — Progress Notes (Signed)
  Subjective:    Patient ID: Christie [name], female    DOB: 08/04/1985, 26 y.o.   MRN: [id number]  HPI  She is here because the vaginal discharge she complained about 3 months ago is still present. I looked back at her wet prep from then and she had BV but was not treated. She also tells me that she and her good friend both had sex with the same man and now her friend has chlamydia.  Review of Systems   She says that she has had the gardasil series and that she uses condoms Says pap smear is UTD Objective:   Physical Exam        Assessment & Plan:  Exposure to chlamydia- treat with doxy BID for 10 days BV- after treatment for chlamydia, if discharge is still present, take the flagyl. NO ETOH.

## 2011-12-01 ENCOUNTER — Encounter (HOSPITAL_COMMUNITY): Payer: Self-pay

## 2011-12-01 ENCOUNTER — Inpatient Hospital Stay (HOSPITAL_COMMUNITY)
Admission: AD | Admit: 2011-12-01 | Discharge: 2011-12-02 | Disposition: A | Discharge status: Home / Self Care | Payer: Self-pay | Source: Ambulatory Visit | Attending: Obstetrics & Gynecology | Admitting: Obstetrics & Gynecology

## 2011-12-01 DIAGNOSIS — O99891 Other specified diseases and conditions complicating pregnancy: Secondary | ICD-10-CM | POA: Insufficient documentation

## 2011-12-01 DIAGNOSIS — M549 Dorsalgia, unspecified: Secondary | ICD-10-CM | POA: Insufficient documentation

## 2011-12-01 DIAGNOSIS — O26899 Other specified pregnancy related conditions, unspecified trimester: Secondary | ICD-10-CM

## 2011-12-01 DIAGNOSIS — R109 Unspecified abdominal pain: Secondary | ICD-10-CM | POA: Insufficient documentation

## 2011-12-01 HISTORY — DX: Gestational (pregnancy-induced) hypertension without significant proteinuria, unspecified trimester: O13.9

## 2011-12-01 LAB — URINALYSIS, ROUTINE W REFLEX MICROSCOPIC
Bilirubin Urine: NEGATIVE
Glucose, UA: NEGATIVE mg/dL
Ketones, ur: NEGATIVE mg/dL
Nitrite: NEGATIVE
Protein, ur: NEGATIVE mg/dL
pH: 6 (ref 5.0–8.0)

## 2011-12-01 LAB — CBC
HCT: 34.4 % — ABNORMAL LOW (ref 36.0–46.0)
Hemoglobin: 10.6 g/dL — ABNORMAL LOW (ref 12.0–15.0)
MCH: 22.5 pg — ABNORMAL LOW (ref 26.0–34.0)
MCV: 72.9 fL — ABNORMAL LOW (ref 78.0–100.0)
Platelets: 238 10*3/uL (ref 150–400)
RBC: 4.72 MIL/uL (ref 3.87–5.11)
WBC: 6.8 10*3/uL (ref 4.0–10.5)

## 2011-12-01 NOTE — MAU Note (Signed)
Pt states, " I've had l cramping for one month in my low abdomen and ocasionally it extends to the left upper abdomen. It is usually followed by pain in my low back. I've had three positive HP. I 've also had a change in my discharge. It is thick and clumpy, but no itching or odor.

## 2011-12-01 NOTE — MAU Provider Note (Signed)
Christie [name] 89 y.o.G2P1001 @[redacted]w[redacted]d  by LMP Chief Complaint  Patient presents with  . Abdominal Pain  . Back Pain  . Vaginal Discharge     First Provider Initiated Contact with Patient 12/01/11 2338      SUBJECTIVE  HPI: Mild-moderate ramping x 1 month w/ pain radiating to low back and pos home UPT. LMP 10/28/11 was normal time, but short and light. She also concerned about the impact of diabetes on pregnancy.  Past Medical History  Diagnosis Date  . Asthma   . Hx of migraines   . Anemia   . Depression   . Fx ankle     history of left ankle fx at age 57  . Pregnancy induced hypertension   . Diabetes mellitus     type 1 on insulin   Past Surgical History  Procedure Date  . Wisdom tooth extraction    History   Social History  . Marital Status: Single    Spouse Name: N/A    Number of Children: N/A  . Years of Education: N/A   Occupational History  . Not on file.   Social History Main Topics  . Smoking status: Current Some Day Smoker    Types: Cigarettes  . Smokeless tobacco: Never Used  . Alcohol Use: Yes     occasional  . Drug Use: No  . Sexually Active: Yes    Birth Control/ Protection: None   Other Topics Concern  . Not on file   Social History Narrative  . No narrative on file   No current facility-administered medications on file prior to encounter.   Current Outpatient Prescriptions on File Prior to Encounter  Medication Sig Dispense Refill  . insulin aspart (NOVOLOG) 100 UNIT/ML injection CBG < 70: Drink juice;  CBG 70 - 120: 0 units: CBG 121 - 150: 2 units; CBG 151 - 200: 3 units; CBG 201 - 250: 5 units; CBG 251 - 300: 8 units;CBG 301 - 350: 11 units; CBG 351 - 400: 15 units; CBG > 400 Call MD       1 vial  0  . insulin glargine (LANTUS SOLOSTAR) 100 UNIT/ML injection Inject 20 Units into the skin at bedtime.  10 mL  12  . ALPRAZolam (XANAX) 0.5 MG tablet Take 0.5 mg by mouth at bedtime.      . naproxen sodium (ANAPROX) 220 MG tablet Take 220  mg by mouth 2 (two) times daily with a meal.         Allergies  Allergen Reactions  . Banana Anaphylaxis  . Citrus     Gums bleed  . Adhesive (Tape) Rash    ROS: Pertinent items in HPI  OBJECTIVE Last menstrual period 10/28/2011.  GENERAL: Well-developed, well-nourished female in no acute distress.  HEENT: Normocephalic HEART: normal rate RESP: normal effort ABDOMEN: Soft, nontender EXTREMITIES: Nontender, no edema NEURO: Alert and oriented SPECULUM EXAM: NEFG, physiologic discharge, no blood noted, cervix clean BIMANUAL: cervix closed; uterus non-pregnant size; no adnexal tenderness or masses.  LAB RESULTS Results for orders placed during the hospital encounter of 12/01/11 (from the past 24 hour(s))  URINALYSIS, ROUTINE W REFLEX MICROSCOPIC     Status: Abnormal   Collection Time   12/01/11 10:39 PM      Component Value Range   Color, Urine YELLOW  YELLOW    APPearance CLEAR  CLEAR    Specific Gravity, Urine 1.015  1.005 - 1.030    pH 6.0  5.0 - 8.0  Glucose, UA NEGATIVE  NEGATIVE (mg/dL)   Hgb urine dipstick NEGATIVE  NEGATIVE    Bilirubin Urine NEGATIVE  NEGATIVE    Ketones, ur NEGATIVE  NEGATIVE (mg/dL)   Protein, ur NEGATIVE  NEGATIVE (mg/dL)   Urobilinogen, UA 0.2  0.0 - 1.0 (mg/dL)   Nitrite NEGATIVE  NEGATIVE    Leukocytes, UA SMALL (*) NEGATIVE   URINE MICROSCOPIC-ADD ON     Status: Abnormal   Collection Time   12/01/11 10:39 PM      Component Value Range   Squamous Epithelial / LPF RARE  RARE    WBC, UA 0-2  <3 (WBC/hpf)   Bacteria, UA FEW (*) RARE    Urine-Other MUCOUS PRESENT    POCT PREGNANCY, URINE     Status: Abnormal   Collection Time   12/01/11 10:45 PM      Component Value Range   Preg Test, Ur POSITIVE (*) NEGATIVE   CBC     Status: Abnormal   Collection Time   12/01/11 11:39 PM      Component Value Range   WBC 6.8  4.0 - 10.5 (K/uL)   RBC 4.72  3.87 - 5.11 (MIL/uL)   Hemoglobin 10.6 (*) 12.0 - 15.0 (g/dL)   HCT 62.9 (*) 52.8 - 46.0 (%)     MCV 72.9 (*) 78.0 - 100.0 (fL)   MCH 22.5 (*) 26.0 - 34.0 (pg)   MCHC 30.8  30.0 - 36.0 (g/dL)   RDW 41.3 (*) 24.4 - 15.5 (%)   Platelets 238  150 - 400 (K/uL)  HCG, QUANTITATIVE, PREGNANCY     Status: Abnormal   Collection Time   12/01/11 11:39 PM      Component Value Range   hCG, Beta Chain, Quant, S 101 (*) <5 (mIU/mL)   IMAGING   ASSESSMENT 1. Abdominal pain complicating pregnancy     PLAN D/C home Follow-up Information    Follow up with [facility] in 2 days. (or as needed if symptoms worsen)    Contact information:   [address] [place] [address] [phone]        Medication List  As of 12/02/2011  7:48 AM   CONTINUE taking these medications         insulin aspart 100 UNIT/ML injection   Commonly known as: novoLOG   CBG < 70: Drink juice;   CBG 70 - 120: 0 units: CBG 121 - 150: 2 units; CBG 151 - 200: 3 units; CBG 201 - 250: 5 units; CBG 251 - 300: 8 units;CBG 301 - 350: 11 units; CBG 351 - 400: 15 units; CBG > 400 Call MD              insulin glargine 100 UNIT/ML injection   Commonly known as: LANTUS   Inject 20 Units into the skin at bedtime.         STOP taking these medications         ALPRAZolam 0.5 MG tablet      naproxen sodium 220 MG tablet          SAB and ectopic precautions Diabetes in pregnancy info given. Discussed great importance of excellent blood sugar control in pregnancy.  Dorathy Kinsman 12/01/2011 11:38 PM

## 2011-12-02 ENCOUNTER — Inpatient Hospital Stay (HOSPITAL_COMMUNITY): Payer: Self-pay

## 2011-12-02 DIAGNOSIS — R109 Unspecified abdominal pain: Secondary | ICD-10-CM

## 2011-12-02 DIAGNOSIS — O26899 Other specified pregnancy related conditions, unspecified trimester: Secondary | ICD-10-CM

## 2011-12-02 NOTE — Discharge Instructions (Signed)
Abdominal Pain During Pregnancy Belly (abdominal) pain is common during pregnancy. Most of the time, it is not a serious problem. Other times, it can be a sign that something is wrong with the pregnancy. Always tell your doctor if you have belly pain. HOME CARE For mild pain:  Do not have sex (intercourse) or put anything in your vagina until you feel better.   Rest until your pain stops. If your pain lasts longer than 1 hour, call your doctor.   Drink clear fluids if you feel sick to your stomach (nauseous).   Do not eat solid food until you feel better.   Only take medicine as told by your doctor.   Keep all doctor visits as told.  GET HELP RIGHT AWAY IF:   You are bleeding, leaking fluid, or pieces of tissue come out of your vagina.   You have more pain or cramping.   You keep throwing up (vomiting).   You have pain when you pee (urinate) or have blood in your pee.   You have a fever.   You do not feel your baby moving as much.   You feel very weak or feel like passing out.   You have trouble breathing, with or without belly pain.   You have a very bad headache and belly pain.   You have fluid leaking from your vagina and belly pain.   You keep having watery poop (diarrhea).   Your belly pain does not go away after resting, or the pain gets worse.  MAKE SURE YOU:   Understand these instructions.   Will watch your condition.   Will get help right away if you are not doing well or get worse.  Document Released: 06/13/2009 Document Revised: 06/14/2011 Document Reviewed: 01/19/2011 Exit[employer] Patient Information 2012 [employer].Abdominal Pain During Pregnancy Belly (abdominal) pain is common during pregnancy. Most of the time, it is not a serious problem. Other times, it can be a sign that something is wrong with the pregnancy. Always tell your doctor if you have belly pain. HOME CARE For mild pain:  Do not have sex (intercourse) or put anything in your vagina  until you feel better.   Rest until your pain stops. If your pain lasts longer than 1 hour, call your doctor.   Drink clear fluids if you feel sick to your stomach (nauseous).   Do not eat solid food until you feel better.   Only take medicine as told by your doctor.   Keep all doctor visits as told.  GET HELP RIGHT AWAY IF:   You are bleeding, leaking fluid, or pieces of tissue come out of your vagina.   You have more pain or cramping.   You keep throwing up (vomiting).   You have pain when you pee (urinate) or have blood in your pee.   You have a fever.   You do not feel your baby moving as much.   You feel very weak or feel like passing out.   You have trouble breathing, with or without belly pain.   You have a very bad headache and belly pain.   You have fluid leaking from your vagina and belly pain.   You keep having watery poop (diarrhea).   Your belly pain does not go away after resting, or the pain gets worse.  MAKE SURE YOU:   Understand these instructions.   Will watch your condition.   Will get help right away if you are not doing   well or get worse.  Document Released: 06/13/2009 Document Revised: 06/14/2011 Document Reviewed: 01/19/2011 Exit[employer] Patient Information 2012 [employer]. 

## 2011-12-04 ENCOUNTER — Inpatient Hospital Stay (HOSPITAL_COMMUNITY)
Admission: AD | Admit: 2011-12-04 | Discharge: 2011-12-04 | Disposition: A | Discharge status: Home / Self Care | Payer: Self-pay | Source: Ambulatory Visit | Attending: Obstetrics and Gynecology | Admitting: Obstetrics and Gynecology

## 2011-12-04 ENCOUNTER — Encounter: Payer: Self-pay | Admitting: Obstetrics and Gynecology

## 2011-12-04 ENCOUNTER — Encounter (HOSPITAL_COMMUNITY): Payer: Self-pay | Admitting: *Deleted

## 2011-12-04 DIAGNOSIS — O99891 Other specified diseases and conditions complicating pregnancy: Secondary | ICD-10-CM | POA: Insufficient documentation

## 2011-12-04 DIAGNOSIS — R109 Unspecified abdominal pain: Secondary | ICD-10-CM | POA: Insufficient documentation

## 2011-12-04 DIAGNOSIS — Z09 Encounter for follow-up examination after completed treatment for conditions other than malignant neoplasm: Secondary | ICD-10-CM | POA: Insufficient documentation

## 2011-12-04 HISTORY — DX: Chlamydial infection, unspecified: A74.9

## 2011-12-04 HISTORY — DX: Urinary tract infection, site not specified: N39.0

## 2011-12-04 HISTORY — DX: Headache: R51

## 2011-12-04 NOTE — MAU Provider Note (Signed)
Christie [name] is a 26 y.o. female who returns for follow up Bhcg. She has no pain or bleeding today. On her last visit, 3 days ago, her Bhcg was 101 and today has increased to 447. Will schedule a follow up ultrasound for one week. Patient will return sooner for problems.  Results for orders placed during the hospital encounter of 12/04/11 (from the past 24 hour(s))  HCG, QUANTITATIVE, PREGNANCY     Status: Abnormal   Collection Time   12/04/11 10:10 AM      Component Value Range   hCG, Beta Chain, Quant, S 447 (*) <5 (mIU/mL)

## 2011-12-04 NOTE — Discharge Instructions (Signed)
Return in one week for follow up ultrasound. Return sooner for any problems.

## 2011-12-04 NOTE — MAU Provider Note (Signed)
Agree with above note.  Christie [name] 12/04/2011 1:13 PM

## 2011-12-04 NOTE — MAU Note (Signed)
for repeat blood work, cramping, no bleeding.

## 2011-12-11 ENCOUNTER — Ambulatory Visit (HOSPITAL_COMMUNITY)
Admission: RE | Admit: 2011-12-11 | Discharge: 2011-12-11 | Disposition: A | Discharge status: Home / Self Care | Payer: Self-pay | Source: Ambulatory Visit | Attending: Nurse Practitioner | Admitting: Nurse Practitioner

## 2011-12-11 ENCOUNTER — Encounter (HOSPITAL_COMMUNITY): Payer: Self-pay | Admitting: *Deleted

## 2011-12-11 ENCOUNTER — Inpatient Hospital Stay (HOSPITAL_COMMUNITY)
Admission: AD | Admit: 2011-12-11 | Discharge: 2011-12-11 | Disposition: A | Discharge status: Home / Self Care | Payer: Self-pay | Source: Ambulatory Visit | Attending: Obstetrics & Gynecology | Admitting: Obstetrics & Gynecology

## 2011-12-11 DIAGNOSIS — Z3490 Encounter for supervision of normal pregnancy, unspecified, unspecified trimester: Secondary | ICD-10-CM

## 2011-12-11 DIAGNOSIS — O99891 Other specified diseases and conditions complicating pregnancy: Secondary | ICD-10-CM | POA: Insufficient documentation

## 2011-12-11 DIAGNOSIS — Z1389 Encounter for screening for other disorder: Secondary | ICD-10-CM

## 2011-12-11 DIAGNOSIS — O3680X0 Pregnancy with inconclusive fetal viability, not applicable or unspecified: Secondary | ICD-10-CM | POA: Insufficient documentation

## 2011-12-11 DIAGNOSIS — Z3689 Encounter for other specified antenatal screening: Secondary | ICD-10-CM | POA: Insufficient documentation

## 2011-12-11 DIAGNOSIS — Z09 Encounter for follow-up examination after completed treatment for conditions other than malignant neoplasm: Secondary | ICD-10-CM | POA: Insufficient documentation

## 2011-12-11 NOTE — MAU Note (Signed)
follow up

## 2011-12-11 NOTE — MAU Provider Note (Signed)
Christie [name] is a 26 y.o. female @ [redacted]w[redacted]d gestation by LMP who presents to MAU for follow up. On her initial visit her Bhcg was 101 and she returned and it increased to 477. She returns today for a follow up ultrasound. Patient without pain or bleeding today.  BP 109/71  Pulse 61  Temp(Src) 98.9 F (37.2 C) (Oral)  Resp 18  LMP 10/28/2011  Ultrasound today shows a 5.4 week IUGS with YS.   Discussed with patient results of ultrasound and need to start prenatal care. Pregnancy verification letter given. Patient already has her first appointment scheduled with [facility]. Will keep that appointment.

## 2011-12-11 NOTE — Discharge Instructions (Signed)
YOUR ULTRASOUND TODAY SHOWS THAT YOU ARE 5 WEEKS AND 4 DAYS PREGNANT. YOU WILL NEED TO START YOUR PRENATAL CARE. RETURN HERE AS NEEDED.

## 2011-12-17 ENCOUNTER — Encounter: Payer: Self-pay | Admitting: Family Medicine

## 2011-12-17 ENCOUNTER — Ambulatory Visit (INDEPENDENT_AMBULATORY_CARE_PROVIDER_SITE_OTHER): Payer: Self-pay | Admitting: Family Medicine

## 2011-12-17 ENCOUNTER — Encounter: Payer: Self-pay | Attending: Family Medicine | Admitting: Dietician

## 2011-12-17 DIAGNOSIS — O99810 Abnormal glucose complicating pregnancy: Secondary | ICD-10-CM | POA: Insufficient documentation

## 2011-12-17 DIAGNOSIS — Z713 Dietary counseling and surveillance: Secondary | ICD-10-CM | POA: Insufficient documentation

## 2011-12-17 DIAGNOSIS — E119 Type 2 diabetes mellitus without complications: Secondary | ICD-10-CM

## 2011-12-17 DIAGNOSIS — O0990 Supervision of high risk pregnancy, unspecified, unspecified trimester: Secondary | ICD-10-CM

## 2011-12-17 DIAGNOSIS — O24919 Unspecified diabetes mellitus in pregnancy, unspecified trimester: Secondary | ICD-10-CM

## 2011-12-17 LAB — POCT URINALYSIS DIP (DEVICE)
Bilirubin Urine: NEGATIVE
Glucose, UA: NEGATIVE mg/dL
Hgb urine dipstick: NEGATIVE
Ketones, ur: NEGATIVE mg/dL
Nitrite: NEGATIVE
Specific Gravity, Urine: >=1.03 (ref 1.005–1.030)
pH: 6 (ref 5.0–8.0)

## 2011-12-17 LAB — WET PREP, GENITAL
Clue Cells Wet Prep HPF POC: NONE SEEN
Trich, Wet Prep: NONE SEEN
Yeast Wet Prep HPF POC: NONE SEEN

## 2011-12-17 MED ORDER — INSULIN NPH (HUMAN) (ISOPHANE) 100 UNIT/ML ~~LOC~~ SUSP: 10.0000 [IU] | Freq: Two times a day (BID) | SUBCUTANEOUS | Status: DC

## 2011-12-17 MED ORDER — INSULIN ASPART 100 UNIT/ML ~~LOC~~ SOLN: SUBCUTANEOUS | Status: DC

## 2011-12-17 MED ORDER — PRENATAL VITAMINS 0.8 MG PO TABS: 1.0000 | ORAL_TABLET | Freq: Every day | ORAL | Status: DC

## 2011-12-17 NOTE — Progress Notes (Signed)
Pulse: 83

## 2011-12-17 NOTE — Progress Notes (Signed)
First Screen scheduled January 25, 2012 at 1030 am at [facility].

## 2011-12-17 NOTE — Progress Notes (Signed)
Diabetes Education;  Completed review of the diet for the diabetes of pregnancy.  Has a history of type 1 diabetes but has never been instructed on carb counting.  Did a brief review of the exchange system and carb counting.  Will try to follow-up as the pregnancy progresses.  Is not insured.  Encouraged to sign-up for pregnancy Medicaid.  Provided True Track meter kit  [id number] EXP: 2015/06/31, and 1 box of strips Lot: [id number] EXP: 2014/03/11 and one box lancets Lot: [id number] EXP: 2015/10/18.  Maggie Ia Leeb, RN, RD, CDE

## 2011-12-17 NOTE — Patient Instructions (Signed)
Pregnancy - First Trimester  During sexual intercourse, millions of sperm go into the vagina. Only 1 sperm will penetrate and fertilize the female egg while it is in the Fallopian tube. One week later, the fertilized egg implants into the wall of the uterus. An embryo begins to develop into a baby. At 6 to 8 weeks, the eyes and face are formed and the heartbeat can be seen on ultrasound. At the end of 12 weeks (first trimester), all the baby's organs are formed. Now that you are pregnant, you will want to do everything you can to have a healthy baby. Two of the most important things are to get good prenatal care and follow your caregiver's instructions. Prenatal care is all the medical care you receive before the baby's birth. It is given to prevent, find, and treat problems during the pregnancy and childbirth.  PRENATAL EXAMS   During prenatal visits, your weight, blood pressure and urine are checked. This is done to make sure you are healthy and progressing normally during the pregnancy.   A pregnant woman should gain 25 to 35 pounds during the pregnancy. However, if you are over weight or underweight, your caregiver will advise you regarding your weight.   Your caregiver will ask and answer questions for you.   Blood work, cervical cultures, other necessary tests and a Pap test are done during your prenatal exams. These tests are done to check on your health and the probable health of your baby. Tests are strongly recommended and done for HIV with your permission. This is the virus that causes AIDS. These tests are done because medications can be given to help prevent your baby from being born with this infection should you have been infected without knowing it. Blood work is also used to find out your blood type, previous infections and follow your blood levels (hemoglobin).   Low hemoglobin (anemia) is common during pregnancy. Iron and vitamins are given to help prevent this. Later in the pregnancy, blood  tests for diabetes will be done along with any other tests if any problems develop. You may need tests to make sure you and the baby are doing well.   You may need other tests to make sure you and the baby are doing well.  CHANGES DURING THE FIRST TRIMESTER (THE FIRST 3 MONTHS OF PREGNANCY)  Your body goes through many changes during pregnancy. They vary from person to person. Talk to your caregiver about changes you notice and are concerned about. Changes can include:   Your menstrual period stops.   The egg and sperm carry the genes that determine what you look like. Genes from you and your partner are forming a baby. The female genes determine whether the baby is a boy or a girl.   Your body increases in girth and you may feel bloated.   Feeling sick to your stomach (nauseous) and throwing up (vomiting). If the vomiting is uncontrollable, call your caregiver.   Your breasts will begin to enlarge and become tender.   Your nipples may stick out more and become darker.   The need to urinate more. Painful urination may mean you have a bladder infection.   Tiring easily.   Loss of appetite.   Cravings for certain kinds of food.   At first, you may gain or lose a couple of pounds.   You may have changes in your emotions from day to day (excited to be pregnant or concerned something may go wrong with   the pregnancy and baby).   You may have more vivid and strange dreams.  HOME CARE INSTRUCTIONS    It is very important to avoid all smoking, alcohol and un-prescribed drugs during your pregnancy. These affect the formation and growth of the baby. Avoid chemicals while pregnant to ensure the delivery of a healthy infant.   Start your prenatal visits by the 12th week of pregnancy. They are usually scheduled monthly at first, then more often in the last 2 months before delivery. Keep your caregiver's appointments. Follow your caregiver's instructions regarding medication use, blood and lab tests, exercise, and  diet.   During pregnancy, you are providing food for you and your baby. Eat regular, well-balanced meals. Choose foods such as meat, fish, milk and other low fat dairy products, vegetables, fruits, and whole-grain breads and cereals. Your caregiver will tell you of the ideal weight gain.   You can help morning sickness by keeping soda crackers at the bedside. Eat a couple before arising in the morning. You may want to use the crackers without salt on them.   Eating 4 to 5 small meals rather than 3 large meals a day also may help the nausea and vomiting.   Drinking liquids between meals instead of during meals also seems to help nausea and vomiting.   A physical sexual relationship may be continued throughout pregnancy if there are no other problems. Problems may be early (premature) leaking of amniotic fluid from the membranes, vaginal bleeding, or belly (abdominal) pain.   Exercise regularly if there are no restrictions. Check with your caregiver or physical therapist if you are unsure of the safety of some of your exercises. Greater weight gain will occur in the last 2 trimesters of pregnancy. Exercising will help:   Control your weight.   Keep you in shape.   Prepare you for labor and delivery.   Help you lose your pregnancy weight after you deliver your baby.   Wear a good support or jogging bra for breast tenderness during pregnancy. This may help if worn during sleep too.   Ask when prenatal classes are available. Begin classes when they are offered.   Do not use hot tubs, steam rooms or saunas.   Wear your seat belt when driving. This protects you and your baby if you are in an accident.   Avoid raw meat, uncooked cheese, cat litter boxes and soil used by cats throughout the pregnancy. These carry germs that can cause birth defects in the baby.   The first trimester is a good time to visit your dentist for your dental health. Getting your teeth cleaned is OK. Use a softer toothbrush and brush  gently during pregnancy.   Ask for help if you have financial, counseling or nutritional needs during pregnancy. Your caregiver will be able to offer counseling for these needs as well as refer you for other special needs.   Do not take any medications or herbs unless told by your caregiver.   Inform your caregiver if there is any mental or physical domestic violence.   Make a list of emergency phone numbers of family, friends, hospital, and police and fire departments.   Write down your questions. Take them to your prenatal visit.   Do not douche.   Do not cross your legs.   If you have to stand for long periods of time, rotate you feet or take small steps in a circle.   You may have more vaginal secretions that may   require a sanitary pad. Do not use tampons or scented sanitary pads.  MEDICATIONS AND DRUG USE IN PREGNANCY   Take prenatal vitamins as directed. The vitamin should contain 1 milligram of folic acid. Keep all vitamins out of reach of children. Only a couple vitamins or tablets containing iron may be fatal to a baby or young child when ingested.   Avoid use of all medications, including herbs, over-the-counter medications, not prescribed or suggested by your caregiver. Only take over-the-counter or prescription medicines for pain, discomfort, or fever as directed by your caregiver. Do not use aspirin, ibuprofen, or naproxen unless directed by your caregiver.   Let your caregiver also know about herbs you may be using.   Alcohol is related to a number of birth defects. This includes fetal alcohol syndrome. All alcohol, in any form, should be avoided completely. Smoking will cause low birth rate and premature babies.   Street or illegal drugs are very harmful to the baby. They are absolutely forbidden. A baby born to an addicted mother will be addicted at birth. The baby will go through the same withdrawal an adult does.   Let your caregiver know about any medications that you have to take  and for what reason you take them.  MISCARRIAGE IS COMMON DURING PREGNANCY  A miscarriage does not mean you did something wrong. It is not a reason to worry about getting pregnant again. Your caregiver will help you with questions you may have. If you have a miscarriage, you may need minor surgery.  SEEK MEDICAL CARE IF:   You have any concerns or worries during your pregnancy. It is better to call with your questions if you feel they cannot wait, rather than worry about them.  SEEK IMMEDIATE MEDICAL CARE IF:    An unexplained oral temperature above 102 F (38.9 C) develops, or as your caregiver suggests.   You have leaking of fluid from the vagina (birth canal). If leaking membranes are suspected, take your temperature and inform your caregiver of this when you call.   There is vaginal spotting or bleeding. Notify your caregiver of the amount and how many pads are used.   You develop a bad smelling vaginal discharge with a change in the color.   You continue to feel sick to your stomach (nauseated) and have no relief from remedies suggested. You vomit blood or coffee ground-like materials.   You lose more than 2 pounds of weight in 1 week.   You gain more than 2 pounds of weight in 1 week and you notice swelling of your face, hands, feet, or legs.   You gain 5 pounds or more in 1 week (even if you do not have swelling of your hands, face, legs, or feet).   You get exposed to German measles and have never had them.   You are exposed to fifth disease or chickenpox.   You develop belly (abdominal) pain. Round ligament discomfort is a common non-cancerous (benign) cause of abdominal pain in pregnancy. Your caregiver still must evaluate this.   You develop headache, fever, diarrhea, pain with urination, or shortness of breath.   You fall or are in a car accident or have any kind of trauma.   There is mental or physical violence in your home.  Document Released: 06/19/2001 Document Revised: 06/14/2011  Document Reviewed: 12/21/2008  [employer] Patient Information 2012 [employer].

## 2011-12-17 NOTE — Progress Notes (Signed)
   Subjective:    Christie [name] is a G2P1001 [redacted]w[redacted]d being seen today for her first obstetrical visit.  Her obstetrical history is significant for Type 1 diabetes. Patient does intend to breast feed. Pregnancy history fully reviewed.  Patient reports nausea, no bleeding, no contractions, no cramping and no leaking.  Filed Vitals:   12/17/11 0951  BP: 106/70  Temp: 98.5 F (36.9 C)  Weight: 189 lb 8 oz (85.957 kg)    HISTORY: OB History    Grav Para Term Preterm Abortions TAB SAB Ect Mult Living   2 1 1  0 0 0 0 0 0 1     # Outc Date GA Lbr Len/2nd Wgt Sex Del Anes PTL Lv   1 TRM 3/09   9lb6oz(4.252kg) M SVD EPI No Yes   Comments: pre eclampsia   2 CUR              Past Medical History  Diagnosis Date  . Asthma   . Hx of migraines   . Anemia   . Depression   . Fx ankle     history of left ankle fx at age 81  . Pregnancy induced hypertension   . Diabetes mellitus     type 1 on insulin  . Headache   . Urinary tract infection   . Chlamydia    Past Surgical History  Procedure Date  . Wisdom tooth extraction    Family History  Problem Relation Age of Onset  . Asthma Mother   . Diabetes Mother   . Hypertension Mother   . Heart disease Mother   . Asthma Father   . Diabetes Father   . Anesthesia problems Neg Hx      Exam    Uterus:     Pelvic Exam:    Perineum: No Hemorrhoids   Vulva: normal   Vagina:  normal mucosa, curdlike discharge, wet prep done   Cervix: no cervical motion tenderness and no lesions   Adnexa: normal adnexa and no mass, fullness, tenderness   Bony Pelvis: gynecoid  System:     Skin: normal coloration and turgor, no rashes    Neurologic: oriented, normal, grossly non-focal   Extremities: normal strength, tone, and muscle mass   HEENT PERRLA and extra ocular movement intact   Mouth/Teeth mucous membranes moist, pharynx normal without lesions   Neck supple and no masses   Cardiovascular: regular rate and rhythm, no murmurs or  gallops   Respiratory:  appears well, vitals normal, no respiratory distress, acyanotic, normal RR, ear and throat exam is normal, neck free of mass or lymphadenopathy, chest clear, no wheezing, crepitations, rhonchi, normal symmetric air entry   Abdomen: soft, non-tender; bowel sounds normal; no masses,  no organomegaly   Urinary: urethral meatus normal      Assessment:    Pregnancy: G2P1001 Patient Active Problem List  Diagnoses  . Diabetes mellitus  . Migraine  . Supervision of high-risk pregnancy  . Diabetes mellitus in pregnancy, antepartum        Plan:     Initial labs drawn. Prenatal vitamins. Problem list reviewed and updated. Genetic Screening discussed First Screen: ordered.  Ultrasound discussed; fetal survey: requested.  Follow up in 1 weeks. 50% of 45 min visit spent on counseling and coordination of care.  HgA1c - changed LA insulin to NPH 10 units BID and SA insulin to 3 units with breakfast and dinner.  Christie [name] 12/17/2011

## 2011-12-18 ENCOUNTER — Encounter: Payer: Self-pay | Admitting: Family Medicine

## 2011-12-18 LAB — OBSTETRIC PANEL
Antibody Screen: NEGATIVE
Eosinophils Absolute: 0.1 10*3/uL (ref 0.0–0.7)
HCT: 36.7 % (ref 36.0–46.0)
Hemoglobin: 11.5 g/dL — ABNORMAL LOW (ref 12.0–15.0)
Hepatitis B Surface Ag: NEGATIVE
Lymphs Abs: 1.6 10*3/uL (ref 0.7–4.0)
MCH: 22.9 pg — ABNORMAL LOW (ref 26.0–34.0)
Monocytes Absolute: 0.5 10*3/uL (ref 0.1–1.0)
Monocytes Relative: 13 % — ABNORMAL HIGH (ref 3–12)
Neutrophils Relative %: 44 % (ref 43–77)
RBC: 5.03 MIL/uL (ref 3.87–5.11)
Rh Type: POSITIVE

## 2011-12-19 LAB — CULTURE, OB URINE: Colony Count: 35000

## 2011-12-19 LAB — HEMOGLOBINOPATHY EVALUATION
Hemoglobin Other: 0 %
Hgb A: 97.4 % (ref 96.8–97.8)

## 2011-12-24 ENCOUNTER — Encounter: Payer: Self-pay | Admitting: Family

## 2011-12-24 ENCOUNTER — Telehealth: Payer: Self-pay | Admitting: *Deleted

## 2011-12-24 NOTE — Telephone Encounter (Signed)
Message copied by Gerome Apley on Mon Dec 24, 2011  4:49 PM ------      Message from: Elwin Sleight A      Created: Mon Dec 24, 2011  3:55 PM       Patient appointment is 07/10 @ 2:30                  ----- Message -----         From: Drucilla Schmidt Day, RN         Sent: 12/24/2011   3:45 PM           To: [facility] Admin Pool            Please schedule colpo appt then send back to clinical pool for pt notification.  Thanks.       ----- Message -----         From: Levie Heritage, DO         Sent: 12/24/2011   1:22 PM           To: [facility] Clinical Pool            Patient needs to be scheduled for colposcopy.

## 2011-12-25 NOTE — Telephone Encounter (Signed)
Pt came to clinic today to ask about her Pap results.  I explained the results and the colposcopy procedure.  All of pt's questions answered to her satisfaction.  Pt DNKA appt yesterday for prenatal visit- she said she overslept.  She has been re-scheduled to 6/24 @ 0900.  Re-educated pt of proper times to be checking her blood sugar and the importance of bringing log book to each visit.  Pt voiced understanding.

## 2011-12-31 ENCOUNTER — Encounter: Payer: Self-pay | Admitting: Family Medicine

## 2012-01-14 ENCOUNTER — Telehealth: Payer: Self-pay | Admitting: *Deleted

## 2012-01-14 DIAGNOSIS — N760 Acute vaginitis: Secondary | ICD-10-CM

## 2012-01-14 MED ORDER — FLUCONAZOLE 150 MG PO TABS: 150.0000 mg | ORAL_TABLET | Freq: Once | ORAL | Status: AC

## 2012-01-14 NOTE — Telephone Encounter (Signed)
Pt is having yeast infection symptoms, will call in diflucan per standing orders. Also approved by Maylon Cos.

## 2012-01-16 ENCOUNTER — Encounter: Payer: Self-pay | Admitting: Physician Assistant

## 2012-01-24 ENCOUNTER — Other Ambulatory Visit: Payer: Self-pay | Admitting: Family Medicine

## 2012-01-24 DIAGNOSIS — Z3682 Encounter for antenatal screening for nuchal translucency: Secondary | ICD-10-CM

## 2012-01-25 ENCOUNTER — Ambulatory Visit (HOSPITAL_COMMUNITY): Payer: Self-pay

## 2012-01-25 ENCOUNTER — Encounter: Payer: Self-pay | Admitting: Advanced Practice Midwife

## 2012-01-25 ENCOUNTER — Telehealth: Payer: Self-pay | Admitting: *Deleted

## 2012-01-25 ENCOUNTER — Ambulatory Visit (INDEPENDENT_AMBULATORY_CARE_PROVIDER_SITE_OTHER): Payer: Self-pay | Admitting: Advanced Practice Midwife

## 2012-01-25 VITALS — BP 125/77 | HR 90 | Temp 98.2°F | Ht 69.0 in | Wt 188.0 lb

## 2012-01-25 DIAGNOSIS — N760 Acute vaginitis: Secondary | ICD-10-CM

## 2012-01-25 MED ORDER — METRONIDAZOLE 500 MG PO TABS: 500.0000 mg | ORAL_TABLET | Freq: Two times a day (BID) | ORAL | Status: AC

## 2012-01-25 MED ORDER — FLUCONAZOLE 150 MG PO TABS: 150.0000 mg | ORAL_TABLET | Freq: Once | ORAL | Status: AC

## 2012-01-25 NOTE — Telephone Encounter (Signed)
Pt called and spoke w/Antoinette.  She stated that she was treated for yeast infection last week and is still having very bad itching and vaginal discomfort. Pt was offered appt today @ 1045 and agreed.

## 2012-01-25 NOTE — Progress Notes (Signed)
  Subjective:    Patient ID: Christie [name], female    DOB: July 31, 1985, 26 y.o.   MRN: [id number]  HPI: Reports vaginal discharge and irritation. Rx Diflucan 01/14/12. Temporary relief. Thinks she may have BV. No IC since last GC/CT testing (neg) Denies testing today. Denies abd pain, fever, chills. Patient has type 1 diabetes. States she has normal blood sugars. Had termination of pregnancy without complications approximately one month ago.  Review of Systems: Pertinent findings in HPI.      Objective:   Physical Exam BP 125/77  Pulse 90  Temp 98.2 F (36.8 C) (Oral)  Ht 5\' 9"  (1.753 m)  Wt 188 lb (85.276 kg)  BMI 27.76 kg/m2  LMP 10/28/2011 General: No apparent distress, alert and oriented x4 Abdomen: Soft nontender no masses Pelvic: Normal external female genitalia, small amount of thin, white, malodorous vaginal discharge. No bleeding. Cervix clean. No cervical motion tenderness. No adnexal masses or tenderness.    Assessment & Plan:   1. Vaginitis  metroNIDAZOLE (FLAGYL) 500 MG tablet, fluconazole (DIFLUCAN) 150 MG tablet, Wet prep, genital      Requesting additional Diflucan prescription due to frequent yeast infections. Prescription given. Discussed connection between elevated blood sugars and recurrent yeast infections. Encouraged excellent blood sugar control for prevention.  [place], CNM 01/25/2012 11:32 AM

## 2012-01-25 NOTE — Patient Instructions (Signed)
Colposcopy Colposcopy is a procedure that uses a special lighted microscope (colposcope). It examines your cervix and vagina, or the area around the outside of the vagina, for signs of disease or abnormalities in the cells. You may be sent to a specialist (gynecologist) to do the colposcopy. A biopsy (tissue sample) may be collected during a colposcopy, if the caregiver finds any unusual cells. The biopsy is sent to the lab for further testing, and the results are reported back to your caregiver. A WOMAN MAY NEED THIS PROCEDURE IF:  She has had an abnormal pap smear (taking cells from the cervix for testing).   She has a sore on her cervix, and a Pap test was normal.   The Pap test suggests human papilloma virus (HPV). This virus can cause genital warts and is linked to the development of cervical cancer.   She has genital warts on the cervix, or in or around the outside of the vagina.   Her mother took the drug DES while pregnant.   She has painful intercourse.   She has vaginal bleeding, especially after sexual intercourse.   There is a need to evaluate the results of previous treatment.  BEFORE THE PROCEDURE   Colposcopy is done when you are not having a menstrual period.   For 24 hours before the colposcopy, do not:   Douche.   Use tampons.   Use medicines, creams, or suppositories in the vagina.   Have sexual intercourse.  PROCEDURE   A colposcopy is done while a woman is lying on her back with her feet in foot rests (stirrups).   A speculum is placed inside the vagina to keep it open and to allow the caregiver to see the cervix. This is the same instrument used to do a pap smear.   The colposcope is placed outside the vagina. It is used to magnify and examine the cervix, vagina, and the area around the outside of the vagina.   A small amount of liquid solution is placed on the area that is to be viewed. This solution is placed on with a cotton applicator. This solution  makes it easier to see the abnormal cells.   Your caregiver will suck out mucus and cells from the canal of the cervix.   Small pieces of tissue for biopsy may be taken at the same time. You may feel mild pain or discomfort when this is done.   Your caregiver will record the location of the abnormal areas and send the tissue samples to a lab for analysis.   If your caregiver biopsies the vagina or outside of the vagina, a local anesthetic (novocaine) is usually given.  AFTER THE PROCEDURE   You may have some cramping that often goes away in a few minutes. You may have some soreness for a couple of days.   You may take over-the-counter pain medicine as advised by your caregiver. Do not take aspirin because it can cause bleeding.   Lie down for a few minutes if you feel lightheaded.   You may have some bleeding or dark discharge that should stop in a few days.   You may need to wear a sanitary pad for a few days.  HOME CARE INSTRUCTIONS   Avoid sex, douching, and using tampons for a week or as directed.   Only take medicine as directed by your caregiver.   Continue to take birth control pills, if you are on them.   Not all test results are   available during your visit. If your test results are not back during the visit, make an appointment with your caregiver to find out the results. Do not assume everything is normal if you have not heard from your caregiver or the medical facility. It is important for you to follow up on all of your test results.   Follow your caregiver's advice regarding medicines, activity, follow-up visits, and follow-up Pap tests.  SEEK MEDICAL CARE IF:   You develop a rash.   You have problems with your medicine.  SEEK IMMEDIATE MEDICAL CARE IF:  You are bleeding heavily or are passing blood clots.   You develop a fever over 102 F (38.9 C), with or without chills.   You have abnormal vaginal discharge.   You are having cramps that do not go away  after taking your pain medicine.   You feel lightheaded, dizzy, or faint.   You develop stomach pain.  Document Released: 09/15/2002 Document Revised: 06/14/2011 Document Reviewed: 04/28/2009 [employer] Patient Information 2012 [employer]. 

## 2012-01-26 LAB — WET PREP, GENITAL
Trich, Wet Prep: NONE SEEN
Yeast Wet Prep HPF POC: NONE SEEN

## 2012-01-28 ENCOUNTER — Encounter: Payer: Self-pay | Admitting: Advanced Practice Midwife

## 2012-02-22 ENCOUNTER — Encounter: Payer: Self-pay | Admitting: Obstetrics & Gynecology

## 2012-03-16 ENCOUNTER — Inpatient Hospital Stay (HOSPITAL_COMMUNITY): Payer: Self-pay

## 2012-03-16 ENCOUNTER — Inpatient Hospital Stay (HOSPITAL_COMMUNITY)
Admission: AD | Admit: 2012-03-16 | Discharge: 2012-03-16 | Disposition: A | Discharge status: Home / Self Care | Payer: Self-pay | Source: Ambulatory Visit | Attending: Obstetrics & Gynecology | Admitting: Obstetrics & Gynecology

## 2012-03-16 ENCOUNTER — Encounter (HOSPITAL_COMMUNITY): Payer: Self-pay | Admitting: Obstetrics and Gynecology

## 2012-03-16 DIAGNOSIS — O26899 Other specified pregnancy related conditions, unspecified trimester: Secondary | ICD-10-CM

## 2012-03-16 DIAGNOSIS — R109 Unspecified abdominal pain: Secondary | ICD-10-CM | POA: Insufficient documentation

## 2012-03-16 DIAGNOSIS — O99891 Other specified diseases and conditions complicating pregnancy: Secondary | ICD-10-CM | POA: Insufficient documentation

## 2012-03-16 LAB — URINALYSIS, ROUTINE W REFLEX MICROSCOPIC
Ketones, ur: NEGATIVE mg/dL
Leukocytes, UA: NEGATIVE
Nitrite: NEGATIVE
Protein, ur: NEGATIVE mg/dL
Urobilinogen, UA: 0.2 mg/dL (ref 0.0–1.0)

## 2012-03-16 LAB — CBC
HCT: 34.5 % — ABNORMAL LOW (ref 36.0–46.0)
Hemoglobin: 11 g/dL — ABNORMAL LOW (ref 12.0–15.0)
MCH: 23.7 pg — ABNORMAL LOW (ref 26.0–34.0)
MCV: 74.2 fL — ABNORMAL LOW (ref 78.0–100.0)
RBC: 4.65 MIL/uL (ref 3.87–5.11)
WBC: 3.6 10*3/uL — ABNORMAL LOW (ref 4.0–10.5)

## 2012-03-16 LAB — WET PREP, GENITAL
Clue Cells Wet Prep HPF POC: NONE SEEN
Trich, Wet Prep: NONE SEEN
Yeast Wet Prep HPF POC: NONE SEEN

## 2012-03-16 NOTE — MAU Provider Note (Signed)
History     CSN: [id number]  Arrival date and time: 03/16/12 1048   None     Chief Complaint  Patient presents with  . Abdominal Pain  . Nausea   HPI  "I've had lower abd pain for about a month, but the past 3 days have gotten worse. The pain is off and on all day on most days, but definitely worse at night. No VB. N/V is recent for the last 3 days."   Past Medical History  Diagnosis Date  . Asthma   . Hx of migraines   . Anemia   . Depression   . Fx ankle     history of left ankle fx at age 46  . Pregnancy induced hypertension   . Diabetes mellitus     type 1 on insulin  . Headache   . Urinary tract infection   . Chlamydia     Past Surgical History  Procedure Date  . Wisdom tooth extraction     Family History  Problem Relation Age of Onset  . Asthma Mother   . Diabetes Mother   . Hypertension Mother   . Heart disease Mother   . Asthma Father   . Diabetes Father   . Anesthesia problems Neg Hx     History  Substance Use Topics  . Smoking status: Former Smoker -- 2 years    Types: Cigarettes    Quit date: 10/17/2011  . Smokeless tobacco: Never Used  . Alcohol Use: No     occasional (once a month)- not (+) UPT    Allergies:  Allergies  Allergen Reactions  . Banana Anaphylaxis  . Citrus     Gums bleed  . Adhesive (Tape) Rash    Prescriptions prior to admission  Medication Sig Dispense Refill  . ALBUTEROL IN Inhale 2 puffs into the lungs daily as needed. For shortness of breath.      . insulin aspart (NOVOLOG) 100 UNIT/ML injection Take 3 units before breakfast and dinner  CBG < 70: Drink juice; CBG 70 - 120: 0 units: CBG 121 - 150: 2 units; CBG 151 - 200: 3 units; CBG 201 - 250: 5 units; CBG 251 - 300: 8 units;CBG 301 - 350: 11 units; CBG 351 - 400: 15 units; CBG > 400 Call MD  1 vial  0  . insulin NPH (HUMULIN N,NOVOLIN N) 100 UNIT/ML injection Inject 10 Units into the skin 2 (two) times daily.  10 mL  3    Review of Systems    Gastrointestinal: Positive for abdominal pain (cramping).  All other systems reviewed and are negative.   Physical Exam   Blood pressure 119/75, pulse 63, temperature 98.7 F (37.1 C), temperature source Oral, resp. rate 18, height 5\' 9"  (1.753 m), weight 86.183 kg (190 lb), last menstrual period 02/11/2012.  Physical Exam  Constitutional: She is oriented to person, place, and time. She appears well-developed and well-nourished. No distress.  HENT:  Head: Normocephalic.  Neck: Normal range of motion. Neck supple.  Cardiovascular: Normal rate, regular rhythm and normal heart sounds.   Respiratory: Effort normal and breath sounds normal.  GI: Soft. There is no tenderness.  Genitourinary: Uterus is enlarged. No bleeding around the vagina. Vaginal discharge (mucusy) found.       Cervix - closed  Neurological: She is alert and oriented to person, place, and time.  Skin: Skin is warm and dry.    MAU Course  Procedures  Results for orders placed during the  hospital encounter of 03/16/12 (from the past 24 hour(s))  URINALYSIS, ROUTINE W REFLEX MICROSCOPIC     Status: Abnormal   Collection Time   03/16/12 11:15 AM      Component Value Range   Color, Urine YELLOW  YELLOW   APPearance CLEAR  CLEAR   Specific Gravity, Urine 1.025  1.005 - 1.030   pH 6.0  5.0 - 8.0   Glucose, UA 100 (*) NEGATIVE mg/dL   Hgb urine dipstick NEGATIVE  NEGATIVE   Bilirubin Urine NEGATIVE  NEGATIVE   Ketones, ur NEGATIVE  NEGATIVE mg/dL   Protein, ur NEGATIVE  NEGATIVE mg/dL   Urobilinogen, UA 0.2  0.0 - 1.0 mg/dL   Nitrite NEGATIVE  NEGATIVE   Leukocytes, UA NEGATIVE  NEGATIVE  POCT PREGNANCY, URINE     Status: Abnormal   Collection Time   03/16/12 11:40 AM      Component Value Range   Preg Test, Ur POSITIVE (*) NEGATIVE  CBC     Status: Abnormal   Collection Time   03/16/12 11:45 AM      Component Value Range   WBC 3.6 (*) 4.0 - 10.5 K/uL   RBC 4.65  3.87 - 5.11 MIL/uL   Hemoglobin 11.0 (*) 12.0 -  15.0 g/dL   HCT 16.1 (*) 09.6 - 04.5 %   MCV 74.2 (*) 78.0 - 100.0 fL   MCH 23.7 (*) 26.0 - 34.0 pg   MCHC 31.9  30.0 - 36.0 g/dL   RDW 40.9 (*) 81.1 - 91.4 %   Platelets 192  150 - 400 K/uL  ABO/RH     Status: Normal   Collection Time   03/16/12 11:45 AM      Component Value Range   ABO/RH(D) A POS    HCG, QUANTITATIVE, PREGNANCY     Status: Abnormal   Collection Time   03/16/12 11:51 AM      Component Value Range   hCG, Beta Chain, Quant, S 3743 (*) <5 mIU/mL  WET PREP, GENITAL     Status: Abnormal   Collection Time   03/16/12  1:10 PM      Component Value Range   Yeast Wet Prep HPF POC NONE SEEN  NONE SEEN   Trich, Wet Prep NONE SEEN  NONE SEEN   Clue Cells Wet Prep HPF POC NONE SEEN  NONE SEEN   WBC, Wet Prep HPF POC FEW (*) NONE SEEN   Ultrasound:  IUP with yolk sac/gestational sac  Assessment and Plan  Abdominal Pain in Pregnancy IUP  Plan: DC to home Return in two weeks for repeat ultrasound  [facility] 03/16/2012, 1:15 PM

## 2012-03-16 NOTE — MAU Note (Signed)
Pt reports having increased lower abd pain for several days  N/V started 3 days ago. Had positive HPT

## 2012-03-16 NOTE — MAU Provider Note (Signed)
Attestation of Attending Supervision of Advanced Practitioner (CNM/NP): Evaluation and management procedures were performed by the Advanced Practitioner under my supervision and collaboration.  I have reviewed the Advanced Practitioner's note and chart, and I agree with the management and plan.  HARRAWAY-SMITH, Lenia Housley 5:21 PM     

## 2012-03-16 NOTE — MAU Note (Signed)
"  I've had lower abd pain for about a month, but the past 3 days have gotten worse.  The pain is off and on all day on most days, but definitely worse at night.  No VB.  N/V is recent for the last 3 days."

## 2012-03-17 LAB — GC/CHLAMYDIA PROBE AMP, GENITAL
Chlamydia, DNA Probe: NEGATIVE
GC Probe Amp, Genital: NEGATIVE

## 2012-03-28 ENCOUNTER — Encounter: Payer: Self-pay | Admitting: Obstetrics & Gynecology

## 2012-03-31 ENCOUNTER — Encounter: Payer: Self-pay | Admitting: Obstetrics & Gynecology

## 2012-03-31 ENCOUNTER — Ambulatory Visit (HOSPITAL_COMMUNITY)
Admission: RE | Admit: 2012-03-31 | Discharge: 2012-03-31 | Disposition: A | Discharge status: Home / Self Care | Payer: Self-pay | Source: Ambulatory Visit | Attending: Family | Admitting: Family

## 2012-03-31 ENCOUNTER — Inpatient Hospital Stay (HOSPITAL_COMMUNITY): Payer: Self-pay

## 2012-03-31 ENCOUNTER — Encounter (HOSPITAL_COMMUNITY): Payer: Self-pay | Admitting: *Deleted

## 2012-03-31 ENCOUNTER — Inpatient Hospital Stay (HOSPITAL_COMMUNITY)
Admission: AD | Admit: 2012-03-31 | Discharge: 2012-03-31 | Disposition: A | Discharge status: Home / Self Care | Payer: Self-pay | Source: Ambulatory Visit | Attending: Obstetrics & Gynecology | Admitting: Obstetrics & Gynecology

## 2012-03-31 DIAGNOSIS — R109 Unspecified abdominal pain: Secondary | ICD-10-CM

## 2012-03-31 DIAGNOSIS — Z331 Pregnant state, incidental: Secondary | ICD-10-CM

## 2012-03-31 DIAGNOSIS — Z3490 Encounter for supervision of normal pregnancy, unspecified, unspecified trimester: Secondary | ICD-10-CM

## 2012-03-31 DIAGNOSIS — O99891 Other specified diseases and conditions complicating pregnancy: Secondary | ICD-10-CM | POA: Insufficient documentation

## 2012-03-31 DIAGNOSIS — O3680X0 Pregnancy with inconclusive fetal viability, not applicable or unspecified: Secondary | ICD-10-CM | POA: Insufficient documentation

## 2012-03-31 DIAGNOSIS — O24919 Unspecified diabetes mellitus in pregnancy, unspecified trimester: Secondary | ICD-10-CM | POA: Insufficient documentation

## 2012-03-31 HISTORY — DX: Anxiety disorder, unspecified: F41.9

## 2012-03-31 HISTORY — DX: Unspecified abnormal cytological findings in specimens from cervix uteri: R87.619

## 2012-03-31 HISTORY — DX: Reserved for concepts with insufficient information to code with codable children: IMO0002

## 2012-03-31 NOTE — MAU Note (Signed)
Here for follow up US.  Cramping at times, has spotted a couple times.

## 2012-03-31 NOTE — MAU Provider Note (Signed)
Medical Screening exam and patient care preformed by advanced practice provider.  Agree with the above management.  

## 2012-03-31 NOTE — MAU Provider Note (Signed)
  History     CSN: [id number]  Arrival date and time: 03/31/12 0933   None     No chief complaint on file.  HPI Christie [name] is 26 y.o. G3P1011 105w0d weeks presents for repeat ultrasound.  She was seen on 03/17/12 for abdominal pain with + UPT.  Labs on that date Blood type A+, bhcg 3743 and U/S that showed an IUGS and YS, no fetal pole.   Is having mild abdominal  Discomfort.  Denies vaginal bleeding.  Plans care at high risk clinic-she is diabetic.     Past Medical History  Diagnosis Date  . Asthma   . Hx of migraines   . Anemia   . Depression   . Fx ankle     history of left ankle fx at age 81  . Pregnancy induced hypertension   . Diabetes mellitus     type 1 on insulin  . Headache   . Urinary tract infection   . Chlamydia     Past Surgical History  Procedure Date  . Wisdom tooth extraction     Family History  Problem Relation Age of Onset  . Asthma Mother   . Diabetes Mother   . Hypertension Mother   . Heart disease Mother   . Asthma Father   . Diabetes Father   . Anesthesia problems Neg Hx     History  Substance Use Topics  . Smoking status: Former Smoker -- 2 years    Types: Cigarettes    Quit date: 10/17/2011  . Smokeless tobacco: Never Used  . Alcohol Use: No     occasional (once a month)- not (+) UPT    Allergies:  Allergies  Allergen Reactions  . Banana Anaphylaxis  . Citrus     Gums bleed  . Adhesive (Tape) Rash    Prescriptions prior to admission  Medication Sig Dispense Refill  . ALBUTEROL IN Inhale 2 puffs into the lungs daily as needed. For shortness of breath.      . insulin aspart (NOVOLOG) 100 UNIT/ML injection Take 3 units before breakfast and dinner  CBG < 70: Drink juice; CBG 70 - 120: 0 units: CBG 121 - 150: 2 units; CBG 151 - 200: 3 units; CBG 201 - 250: 5 units; CBG 251 - 300: 8 units;CBG 301 - 350: 11 units; CBG 351 - 400: 15 units; CBG > 400 Call MD  1 vial  0  . insulin NPH (HUMULIN N,NOVOLIN N) 100 UNIT/ML injection  Inject 10 Units into the skin 2 (two) times daily.  10 mL  3    ROS Physical Exam   Last menstrual period 02/11/2012.  Physical Exam  Constitutional: She is oriented to person, place, and time. She appears well-developed and well-nourished. No distress.  Neurological: She is alert and oriented to person, place, and time.    MAU Course  Procedures  MDM   Assessment and Plan  A:  Follow up ultrasound for viability--7w gestation  P:  Patient plans to walk downstairs to make appt to begin prenatal care.  Earnie Rockhold,EVE M 03/31/2012, 9:36 AM

## 2012-04-02 ENCOUNTER — Ambulatory Visit (INDEPENDENT_AMBULATORY_CARE_PROVIDER_SITE_OTHER): Payer: Self-pay | Admitting: Obstetrics & Gynecology

## 2012-04-02 ENCOUNTER — Encounter: Payer: Self-pay | Admitting: Obstetrics & Gynecology

## 2012-04-02 VITALS — BP 115/76 | HR 83 | Temp 97.4°F | Resp 20 | Ht 69.0 in | Wt 192.2 lb

## 2012-04-02 DIAGNOSIS — R87611 Atypical squamous cells cannot exclude high grade squamous intraepithelial lesion on cytologic smear of cervix (ASC-H): Secondary | ICD-10-CM

## 2012-04-02 NOTE — Progress Notes (Signed)
  Subjective:    Patient ID: Christie [name], female    DOB: [birth year], 26 y.o.   MRN: [id number]  HPI  26 yo G2P1 here for a colposcopy due to a pap ASCUS +HR HPV DNA. She has no complaints.  Review of Systems     Objective:   Physical Exam  Colposcopy adequate, acetic acid applied. Entirely normal exam.      Assessment & Plan:  Reassurance given

## 2012-04-07 ENCOUNTER — Inpatient Hospital Stay (HOSPITAL_COMMUNITY)
Admission: AD | Admit: 2012-04-07 | Discharge: 2012-04-07 | Disposition: A | Discharge status: Home / Self Care | Payer: Self-pay | Source: Ambulatory Visit | Attending: Obstetrics & Gynecology | Admitting: Obstetrics & Gynecology

## 2012-04-07 ENCOUNTER — Encounter (HOSPITAL_COMMUNITY): Payer: Self-pay | Admitting: *Deleted

## 2012-04-07 DIAGNOSIS — R197 Diarrhea, unspecified: Secondary | ICD-10-CM | POA: Insufficient documentation

## 2012-04-07 DIAGNOSIS — R109 Unspecified abdominal pain: Secondary | ICD-10-CM | POA: Insufficient documentation

## 2012-04-07 DIAGNOSIS — E109 Type 1 diabetes mellitus without complications: Secondary | ICD-10-CM | POA: Insufficient documentation

## 2012-04-07 DIAGNOSIS — O24919 Unspecified diabetes mellitus in pregnancy, unspecified trimester: Secondary | ICD-10-CM | POA: Insufficient documentation

## 2012-04-07 DIAGNOSIS — K589 Irritable bowel syndrome without diarrhea: Secondary | ICD-10-CM

## 2012-04-07 LAB — CBC WITH DIFFERENTIAL/PLATELET
Basophils Relative: 0 % (ref 0–1)
Eosinophils Absolute: 0 10*3/uL (ref 0.0–0.7)
Eosinophils Relative: 0 % (ref 0–5)
Hemoglobin: 11.2 g/dL — ABNORMAL LOW (ref 12.0–15.0)
Lymphs Abs: 1.8 10*3/uL (ref 0.7–4.0)
MCH: 23.5 pg — ABNORMAL LOW (ref 26.0–34.0)
MCHC: 31.6 g/dL (ref 30.0–36.0)
MCV: 74.2 fL — ABNORMAL LOW (ref 78.0–100.0)
Monocytes Relative: 12 % (ref 3–12)
Neutrophils Relative %: 53 % (ref 43–77)
Platelets: 202 10*3/uL (ref 150–400)

## 2012-04-07 LAB — COMPREHENSIVE METABOLIC PANEL
Albumin: 3.8 g/dL (ref 3.5–5.2)
Alkaline Phosphatase: 47 U/L (ref 39–117)
BUN: 6 mg/dL (ref 6–23)
Calcium: 9.7 mg/dL (ref 8.4–10.5)
GFR calc Af Amer: >90 mL/min (ref >90)
Glucose, Bld: 113 mg/dL — ABNORMAL HIGH (ref 70–99)
Potassium: 3.7 mEq/L (ref 3.5–5.1)
Total Protein: 7.7 g/dL (ref 6.0–8.3)

## 2012-04-07 LAB — URINALYSIS, ROUTINE W REFLEX MICROSCOPIC
Bilirubin Urine: NEGATIVE
Hgb urine dipstick: NEGATIVE
Ketones, ur: NEGATIVE mg/dL
Nitrite: NEGATIVE
Urobilinogen, UA: 0.2 mg/dL (ref 0.0–1.0)

## 2012-04-07 MED ORDER — LACTINEX PO CHEW: 1.0000 | CHEWABLE_TABLET | Freq: Three times a day (TID) | ORAL | Status: DC

## 2012-04-07 MED ORDER — PSYLLIUM 28 % PO PACK: 1.0000 | PACK | Freq: Every day | ORAL | Status: DC

## 2012-04-07 MED ORDER — SIMETHICONE 80 MG PO CHEW: 80.0000 mg | CHEWABLE_TABLET | Freq: Four times a day (QID) | ORAL | Status: DC | PRN

## 2012-04-07 NOTE — MAU Note (Signed)
Patient states she has been having diarrhea and cramping for a couple of weeks, vomiting started today. No bleeding.

## 2012-04-07 NOTE — MAU Provider Note (Signed)
History     CSN: [id number]  Arrival date and time: 04/07/12 1633   First Provider Initiated Contact with Patient 04/07/12 1708      Chief Complaint  Patient presents with  . Abdominal Pain  . Emesis  . Diarrhea   HPI Severe, intermittent abdominal cramps since shortly after start of pregnancy. Mid-abdominal, not like uterine cramps. Daily, worse at night but happen throughout the day. May last for an hour. No relation to food/meals. Relieved by bowel movement sometimes. Has had diarrhea for 2 weeks, every 2-3 days. No blood or mucous, but loose/watery stools, large volume. No fever/chills but does have cold sweats when pain is worst. No dysuria/urgency. Does have nausea/vomiting, decreased appetite since pregnancy onset, not related to cramping.  No recent travel or camping. No known sick contacts. No history of inflammatory or irritable bowel. Does have history of irregular bowel habits but denies constipation or hard stools. Also has history of headaches that have become worse with pregnancy. Taking ibuprofen.   OB History    Grav Para Term Preterm Abortions TAB SAB Ect Mult Living   3 1 1  0 1 1 0 0 0 1      Past Medical History  Diagnosis Date  . Asthma   . Hx of migraines   . Anemia   . Depression   . Fx ankle     history of left ankle fx at age 45  . Pregnancy induced hypertension   . Diabetes mellitus     type 1 on insulin  . Headache   . Urinary tract infection   . Chlamydia   . Abnormal Pap smear     colpo scheduled  . Anxiety     Past Surgical History  Procedure Date  . Wisdom tooth extraction   . Colposcopy     Family History  Problem Relation Age of Onset  . Asthma Mother   . Diabetes Mother   . Hypertension Mother   . Asthma Father   . Anesthesia problems Neg Hx   . Hypertension Sister     History  Substance Use Topics  . Smoking status: Never Smoker   . Smokeless tobacco: Never Used  . Alcohol Use: Yes     occasional (once a month)- not  (+) UPT    Allergies:  Allergies  Allergen Reactions  . Banana Anaphylaxis  . Citrus     Gums bleed  . Adhesive (Tape) Rash    Prescriptions prior to admission  Medication Sig Dispense Refill  . ALBUTEROL IN Inhale 2 puffs into the lungs daily as needed. For shortness of breath.      . insulin aspart (NOVOLOG) 100 UNIT/ML injection Take 3 units before breakfast and dinner  CBG < 70: Drink juice; CBG 70 - 120: 0 units: CBG 121 - 150: 2 units; CBG 151 - 200: 3 units; CBG 201 - 250: 5 units; CBG 251 - 300: 8 units;CBG 301 - 350: 11 units; CBG 351 - 400: 15 units; CBG > 400 Call MD  1 vial  0  . insulin glargine (LANTUS) 100 UNIT/ML injection Inject 20 Units into the skin at bedtime.      . insulin NPH (HUMULIN N,NOVOLIN N) 100 UNIT/ML injection Inject 10 Units into the skin 2 (two) times daily.  10 mL  3    Review of Systems  Constitutional: Negative for fever and chills.  Eyes: Negative for blurred vision and double vision.  Respiratory: Negative for hemoptysis and shortness of  breath.   Cardiovascular: Negative for chest pain and palpitations.  Gastrointestinal: Positive for nausea, vomiting, abdominal pain and diarrhea. Negative for heartburn, constipation, blood in stool and melena.  Genitourinary: Negative for dysuria, urgency and flank pain.  Musculoskeletal: Negative for back pain.  Skin: Negative for rash.  Neurological: Positive for headaches. Negative for dizziness.   Physical Exam   Blood pressure 111/63, pulse 83, temperature 98.5 F (36.9 C), temperature source Oral, resp. rate 16, height 5\' 9"  (1.753 m), weight 85.639 kg (188 lb 12.8 oz), last menstrual period 02/11/2012, SpO2 100.00%, unknown if currently breastfeeding.  Physical Exam  Constitutional: She is oriented to person, place, and time. She appears well-developed and well-nourished. No distress.  HENT:  Head: Normocephalic and atraumatic.  Eyes: Conjunctivae normal and EOM are normal.  Neck: Normal range  of motion. Neck supple. No JVD present. No tracheal deviation present. No thyromegaly present.  Cardiovascular: Normal rate, regular rhythm, normal heart sounds and intact distal pulses.   Respiratory: Effort normal and breath sounds normal. No respiratory distress.  GI: She exhibits no distension and no mass. There is no tenderness. There is no rebound and no guarding.  Musculoskeletal: Normal range of motion. She exhibits no edema and no tenderness.  Neurological: She is alert and oriented to person, place, and time.  Skin: Skin is warm and dry.  Psychiatric: She has a normal mood and affect.    MAU Course  Procedures  Results for orders placed during the hospital encounter of 04/07/12 (from the past 24 hour(s))  URINALYSIS, ROUTINE W REFLEX MICROSCOPIC     Status: Normal   Collection Time   04/07/12  4:50 PM      Component Value Range   Color, Urine YELLOW  YELLOW   APPearance CLEAR  CLEAR   Specific Gravity, Urine 1.025  1.005 - 1.030   pH 6.0  5.0 - 8.0   Glucose, UA NEGATIVE  NEGATIVE mg/dL   Hgb urine dipstick NEGATIVE  NEGATIVE   Bilirubin Urine NEGATIVE  NEGATIVE   Ketones, ur NEGATIVE  NEGATIVE mg/dL   Protein, ur NEGATIVE  NEGATIVE mg/dL   Urobilinogen, UA 0.2  0.0 - 1.0 mg/dL   Nitrite NEGATIVE  NEGATIVE   Leukocytes, UA NEGATIVE  NEGATIVE  CBC WITH DIFFERENTIAL     Status: Abnormal   Collection Time   04/07/12  6:12 PM      Component Value Range   WBC 5.1  4.0 - 10.5 K/uL   RBC 4.77  3.87 - 5.11 MIL/uL   Hemoglobin 11.2 (*) 12.0 - 15.0 g/dL   HCT 45.4 (*) 09.8 - 11.9 %   MCV 74.2 (*) 78.0 - 100.0 fL   MCH 23.5 (*) 26.0 - 34.0 pg   MCHC 31.6  30.0 - 36.0 g/dL   RDW 14.7 (*) 82.9 - 56.2 %   Platelets 202  150 - 400 K/uL   Neutrophils Relative 53  43 - 77 %   Neutro Abs 2.7  1.7 - 7.7 K/uL   Lymphocytes Relative 35  12 - 46 %   Lymphs Abs 1.8  0.7 - 4.0 K/uL   Monocytes Relative 12  3 - 12 %   Monocytes Absolute 0.6  0.1 - 1.0 K/uL   Eosinophils Relative 0  0  - 5 %   Eosinophils Absolute 0.0  0.0 - 0.7 K/uL   Basophils Relative 0  0 - 1 %   Basophils Absolute 0.0  0.0 - 0.1 K/uL  COMPREHENSIVE METABOLIC  PANEL     Status: Abnormal   Collection Time   04/07/12  6:12 PM      Component Value Range   Sodium 134 (*) 135 - 145 mEq/L   Potassium 3.7  3.5 - 5.1 mEq/L   Chloride 100  96 - 112 mEq/L   CO2 23  19 - 32 mEq/L   Glucose, Bld 113 (*) 70 - 99 mg/dL   BUN 6  6 - 23 mg/dL   Creatinine, Ser 8.41  0.50 - 1.10 mg/dL   Calcium 9.7  8.4 - 32.4 mg/dL   Total Protein 7.7  6.0 - 8.3 g/dL   Albumin 3.8  3.5 - 5.2 g/dL   AST 16  0 - 37 U/L   ALT 10  0 - 35 U/L   Alkaline Phosphatase 47  39 - 117 U/L   Total Bilirubin 0.3  0.3 - 1.2 mg/dL   GFR calc non Af Amer >90  >90 mL/min   GFR calc Af Amer >90  >90 mL/min      Assessment and Plan  26 y.o. G3P1011 at [redacted]w[redacted]d with Type I diabetes, abdominal pain and diarrhea. - Unlikely infectious but need to rule out:  Will send home with stooll collection for O&P, fecal blood and leukocytes.  - Constipation/irritable bowel syndrome:  Trial of probiotics + fiber supplement (metamucil) + simethicone - follow up in OB clinic. If not improved in one or two weeks, consider imaging.    Napoleon Form 04/07/2012, 5:11 PM

## 2012-04-10 ENCOUNTER — Encounter: Payer: Self-pay | Admitting: Obstetrics & Gynecology

## 2012-04-10 ENCOUNTER — Ambulatory Visit: Payer: Self-pay | Admitting: Obstetrics & Gynecology

## 2012-04-10 VITALS — BP 106/77 | Temp 98.0°F | Wt 186.6 lb

## 2012-04-10 DIAGNOSIS — O0990 Supervision of high risk pregnancy, unspecified, unspecified trimester: Secondary | ICD-10-CM

## 2012-04-10 LAB — RPR

## 2012-04-10 LAB — CBC
HCT: 35.7 % — ABNORMAL LOW (ref 36.0–46.0)
Hemoglobin: 11.6 g/dL — ABNORMAL LOW (ref 12.0–15.0)
RBC: 4.88 MIL/uL (ref 3.87–5.11)

## 2012-04-10 LAB — POCT URINALYSIS DIP (DEVICE)
Hgb urine dipstick: NEGATIVE
Nitrite: NEGATIVE
Protein, ur: NEGATIVE mg/dL
Urobilinogen, UA: 0.2 mg/dL (ref 0.0–1.0)
pH: 6.5 (ref 5.0–8.0)

## 2012-04-10 LAB — TSH: TSH: 2.698 u[IU]/mL (ref 0.350–4.500)

## 2012-04-10 LAB — HEPATITIS B SURFACE ANTIGEN: Hepatitis B Surface Ag: NEGATIVE

## 2012-04-10 LAB — HEMOGLOBIN A1C: Hgb A1c MFr Bld: 8 % — ABNORMAL HIGH (ref <5.7)

## 2012-04-10 NOTE — Patient Instructions (Signed)
Pregnancy - First Trimester During sexual intercourse, millions of sperm go into the vagina. Only 1 sperm will penetrate and fertilize the female egg while it is in the Fallopian tube. One week later, the fertilized egg implants into the wall of the uterus. An embryo begins to develop into a baby. At 6 to 8 weeks, the eyes and face are formed and the heartbeat can be seen on ultrasound. At the end of 12 weeks (first trimester), all the baby's organs are formed. Now that you are pregnant, you will want to do everything you can to have a healthy baby. Two of the most important things are to get good prenatal care and follow your caregiver's instructions. Prenatal care is all the medical care you receive before the baby's birth. It is given to prevent, find, and treat problems during the pregnancy and childbirth. PRENATAL EXAMS  During prenatal visits, your weight, blood pressure and urine are checked. This is done to make sure you are healthy and progressing normally during the pregnancy.  A pregnant woman should gain 25 to 35 pounds during the pregnancy. However, if you are over weight or underweight, your caregiver will advise you regarding your weight.  Your caregiver will ask and answer questions for you.  Blood work, cervical cultures, other necessary tests and a Pap test are done during your prenatal exams. These tests are done to check on your health and the probable health of your baby. Tests are strongly recommended and done for HIV with your permission. This is the virus that causes AIDS. These tests are done because medications can be given to help prevent your baby from being born with this infection should you have been infected without knowing it. Blood work is also used to find out your blood type, previous infections and follow your blood levels (hemoglobin).  Low hemoglobin (anemia) is common during pregnancy. Iron and vitamins are given to help prevent this. Later in the pregnancy, blood  tests for diabetes will be done along with any other tests if any problems develop. You may need tests to make sure you and the baby are doing well.  You may need other tests to make sure you and the baby are doing well. CHANGES DURING THE FIRST TRIMESTER (THE FIRST 3 MONTHS OF PREGNANCY) Your body goes through many changes during pregnancy. They vary from person to person. Talk to your caregiver about changes you notice and are concerned about. Changes can include:  Your menstrual period stops.  The egg and sperm carry the genes that determine what you look like. Genes from you and your partner are forming a baby. The female genes determine whether the baby is a boy or a girl.  Your body increases in girth and you may feel bloated.  Feeling sick to your stomach (nauseous) and throwing up (vomiting). If the vomiting is uncontrollable, call your caregiver.  Your breasts will begin to enlarge and become tender.  Your nipples may stick out more and become darker.  The need to urinate more. Painful urination may mean you have a bladder infection.  Tiring easily.  Loss of appetite.  Cravings for certain kinds of food.  At first, you may gain or lose a couple of pounds.  You may have changes in your emotions from day to day (excited to be pregnant or concerned something may go wrong with the pregnancy and baby).  You may have more vivid and strange dreams. HOME CARE INSTRUCTIONS   It is very important   to avoid all smoking, alcohol and un-prescribed drugs during your pregnancy. These affect the formation and growth of the baby. Avoid chemicals while pregnant to ensure the delivery of a healthy infant.  Start your prenatal visits by the 12th week of pregnancy. They are usually scheduled monthly at first, then more often in the last 2 months before delivery. Keep your caregiver's appointments. Follow your caregiver's instructions regarding medication use, blood and lab tests, exercise, and  diet.  During pregnancy, you are providing food for you and your baby. Eat regular, well-balanced meals. Choose foods such as meat, fish, milk and other low fat dairy products, vegetables, fruits, and whole-grain breads and cereals. Your caregiver will tell you of the ideal weight gain.  You can help morning sickness by keeping soda crackers at the bedside. Eat a couple before arising in the morning. You may want to use the crackers without salt on them.  Eating 4 to 5 small meals rather than 3 large meals a day also may help the nausea and vomiting.  Drinking liquids between meals instead of during meals also seems to help nausea and vomiting.  A physical sexual relationship may be continued throughout pregnancy if there are no other problems. Problems may be early (premature) leaking of amniotic fluid from the membranes, vaginal bleeding, or belly (abdominal) pain.  Exercise regularly if there are no restrictions. Check with your caregiver or physical therapist if you are unsure of the safety of some of your exercises. Greater weight gain will occur in the last 2 trimesters of pregnancy. Exercising will help:  Control your weight.  Keep you in shape.  Prepare you for labor and delivery.  Help you lose your pregnancy weight after you deliver your baby.  Wear a good support or jogging bra for breast tenderness during pregnancy. This may help if worn during sleep too.  Ask when prenatal classes are available. Begin classes when they are offered.  Do not use hot tubs, steam rooms or saunas.  Wear your seat belt when driving. This protects you and your baby if you are in an accident.  Avoid raw meat, uncooked cheese, cat litter boxes and soil used by cats throughout the pregnancy. These carry germs that can cause birth defects in the baby.  The first trimester is a good time to visit your dentist for your dental health. Getting your teeth cleaned is OK. Use a softer toothbrush and brush  gently during pregnancy.  Ask for help if you have financial, counseling or nutritional needs during pregnancy. Your caregiver will be able to offer counseling for these needs as well as refer you for other special needs.  Do not take any medications or herbs unless told by your caregiver.  Inform your caregiver if there is any mental or physical domestic violence.  Make a list of emergency phone numbers of family, friends, hospital, and police and fire departments.  Write down your questions. Take them to your prenatal visit.  Do not douche.  Do not cross your legs.  If you have to stand for long periods of time, rotate you feet or take small steps in a circle.  You may have more vaginal secretions that may require a sanitary pad. Do not use tampons or scented sanitary pads. MEDICATIONS AND DRUG USE IN PREGNANCY  Take prenatal vitamins as directed. The vitamin should contain 1 milligram of folic acid. Keep all vitamins out of reach of children. Only a couple vitamins or tablets containing iron may be   fatal to a baby or young child when ingested.  Avoid use of all medications, including herbs, over-the-counter medications, not prescribed or suggested by your caregiver. Only take over-the-counter or prescription medicines for pain, discomfort, or fever as directed by your caregiver. Do not use aspirin, ibuprofen, or naproxen unless directed by your caregiver.  Let your caregiver also know about herbs you may be using.  Alcohol is related to a number of birth defects. This includes fetal alcohol syndrome. All alcohol, in any form, should be avoided completely. Smoking will cause low birth rate and premature babies.  Street or illegal drugs are very harmful to the baby. They are absolutely forbidden. A baby born to an addicted mother will be addicted at birth. The baby will go through the same withdrawal an adult does.  Let your caregiver know about any medications that you have to take  and for what reason you take them. MISCARRIAGE IS COMMON DURING PREGNANCY A miscarriage does not mean you did something wrong. It is not a reason to worry about getting pregnant again. Your caregiver will help you with questions you may have. If you have a miscarriage, you may need minor surgery. SEEK MEDICAL CARE IF:  You have any concerns or worries during your pregnancy. It is better to call with your questions if you feel they cannot wait, rather than worry about them. SEEK IMMEDIATE MEDICAL CARE IF:   An unexplained oral temperature above 102 F (38.9 C) develops, or as your caregiver suggests.  You have leaking of fluid from the vagina (birth canal). If leaking membranes are suspected, take your temperature and inform your caregiver of this when you call.  There is vaginal spotting or bleeding. Notify your caregiver of the amount and how many pads are used.  You develop a bad smelling vaginal discharge with a change in the color.  You continue to feel sick to your stomach (nauseated) and have no relief from remedies suggested. You vomit blood or coffee ground-like materials.  You lose more than 2 pounds of weight in 1 week.  You gain more than 2 pounds of weight in 1 week and you notice swelling of your face, hands, feet, or legs.  You gain 5 pounds or more in 1 week (even if you do not have swelling of your hands, face, legs, or feet).  You get exposed to German measles and have never had them.  You are exposed to fifth disease or chickenpox.  You develop belly (abdominal) pain. Round ligament discomfort is a common non-cancerous (benign) cause of abdominal pain in pregnancy. Your caregiver still must evaluate this.  You develop headache, fever, diarrhea, pain with urination, or shortness of breath.  You fall or are in a car accident or have any kind of trauma.  There is mental or physical violence in your home. Document Released: 06/19/2001 Document Revised: 09/17/2011  Document Reviewed: 12/21/2008 [employer] Patient Information 2013 [employer].  

## 2012-04-10 NOTE — Progress Notes (Signed)
Subjective:    Christie [name] is a [id number] [redacted]w[redacted]d being seen today for her first obstetrical visit.  Her obstetrical history is significant for pregnancy induced hypertension, pre-eclampsia and gestational diabetes. Patient has history of asthma; has albuterol inhaler prn. The last time she used it was about 1 year ago. Patient does intend to breast feed. Pregnancy history fully reviewed.  Patient has T1DM. She currently uses Novolog insulin on sliding scale after 3 meals a day. If sugars are between 120-150 she takes 2-3 units. If 150-170 she takes 5 units.  Blood sugar have been about 115-120 fasting and 130-160 2 hours post prandial.   Patient reports headache, heartburn, nausea, no bleeding, no leaking and cramping.Ceasar Mons Vitals:   04/10/12 0927  BP: 106/77  Temp: 98 F (36.7 C)  Weight: 186 lb 9.6 oz (84.641 kg)    HISTORY: OB History    Grav Para Term Preterm Abortions TAB SAB Ect Mult Living   3 1 1  0 1 1 0 0 0 1     # Outc Date GA Lbr Len/2nd Wgt Sex Del Anes PTL Lv   1 TRM 3/09   9lb6oz(4.252kg) M SVD EPI No Yes   Comments: pre eclampsia   2 TAB 6/13 [redacted]w[redacted]d          Comments: no complications   3 CUR              Past Medical History  Diagnosis Date  . Asthma   . Hx of migraines   . Anemia   . Depression   . Fx ankle     history of left ankle fx at age 26  . Pregnancy induced hypertension   . Diabetes mellitus     type 1 on insulin  . Headache   . Urinary tract infection   . Chlamydia   . Abnormal Pap smear     colpo scheduled  . Anxiety    Past Surgical History  Procedure Date  . Wisdom tooth extraction   . Colposcopy    Family History  Problem Relation Age of Onset  . Asthma Mother   . Diabetes Mother   . Hypertension Mother   . Asthma Father   . Anesthesia problems Neg Hx   . Hypertension Sister      Exam    Uterus:     Pelvic Exam:    Perineum: Done at colpo, nml   Vulva: Done at colpo, nml   Vagina:  Done at colpo, nml   pH: n/a   Cervix:    Adnexa:    Bony Pelvis:   System: Breast:     Skin: normal coloration and turgor, no rashes    Neurologic: oriented, normal, normal mood, gait normal; reflexes normal and symmetric, no focal deficits, CN II-XII intact, motor: 5/5 bilateral upper and lower extremities, sensory intact to: light tough and pain   Extremities: normal strength, tone, and muscle mass, no deformities, no erythema, induration, or nodules, no evidence of joint effusion, ROM of all joints is normal, no evidence of joint instability, gait was normal for age   HEENT PERRLA, extra ocular movement intact, sclera clear, anicteric, oropharynx clear, no lesions, neck supple with midline trachea and thyroid without masses   Mouth/Teeth mucous membranes moist, pharynx normal without lesions and dental hygiene good   Neck supple and no masses   Cardiovascular: regular rate and rhythm, no murmurs or gallops   Respiratory:  appears well, vitals normal, no  respiratory distress, acyanotic, normal RR, ear and throat exam is normal, neck free of mass or lymphadenopathy, chest clear, no wheezing, crepitations, rhonchi, normal symmetric air entry   Abdomen: soft, non-tender; bowel sounds normal; no masses,  no organomegaly   Urinary: urethral meatus normal      Assessment:    Pregnancy: G3P1011 Patient Active Problem List  Diagnosis  . Diabetes mellitus  . Migraine        Plan:     Initial labs drawn. Prenatal vitamins. Problem list reviewed and updated. Genetic Screening discussed First Screen: ordered.  Ultrasound discussed; fetal survey: requested.  Follow up in 2 weeks. Pt is not bleeding.  No signs of miscarriage. Pt needs diabetic education and has appt Monday.  Pt's CBG today fasting is 111.  Will review insulin and CBG log on Monday.   Cherri Yera H. 04/10/2012

## 2012-04-10 NOTE — Progress Notes (Signed)
P = 76 "cramping" pain

## 2012-04-11 LAB — COMPREHENSIVE METABOLIC PANEL
AST: 18 U/L (ref 0–37)
Albumin: 4.2 g/dL (ref 3.5–5.2)
BUN: 7 mg/dL (ref 6–23)
CO2: 25 mEq/L (ref 19–32)
Calcium: 9.4 mg/dL (ref 8.4–10.5)
Chloride: 104 mEq/L (ref 96–112)
Glucose, Bld: 172 mg/dL — ABNORMAL HIGH (ref 70–99)
Potassium: 3.7 mEq/L (ref 3.5–5.3)

## 2012-04-11 LAB — ANTIBODY SCREEN: Antibody Screen: NEGATIVE

## 2012-04-11 NOTE — Addendum Note (Signed)
Addended by: Franchot Mimes on: 04/11/2012 11:46 AM   Modules accepted: Orders

## 2012-04-11 NOTE — Addendum Note (Signed)
Addended by: Franchot Mimes on: 04/11/2012 11:54 AM   Modules accepted: Orders

## 2012-04-12 LAB — CULTURE, OB URINE
Colony Count: NO GROWTH
Organism ID, Bacteria: NO GROWTH

## 2012-04-12 LAB — CREATININE CLEARANCE, URINE, 24 HOUR: Creatinine, 24H Ur: 1704 mg/d (ref 700–1800)

## 2012-04-14 ENCOUNTER — Other Ambulatory Visit: Payer: Self-pay | Admitting: Obstetrics & Gynecology

## 2012-04-14 ENCOUNTER — Encounter: Payer: Self-pay | Admitting: Family Medicine

## 2012-04-14 ENCOUNTER — Encounter: Payer: Self-pay | Attending: Obstetrics & Gynecology | Admitting: Dietician

## 2012-04-14 VITALS — Wt 188.8 lb

## 2012-04-14 DIAGNOSIS — Z713 Dietary counseling and surveillance: Secondary | ICD-10-CM | POA: Insufficient documentation

## 2012-04-14 DIAGNOSIS — O99810 Abnormal glucose complicating pregnancy: Secondary | ICD-10-CM | POA: Insufficient documentation

## 2012-04-14 MED ORDER — INSULIN NPH (HUMAN) (ISOPHANE) 100 UNIT/ML ~~LOC~~ SUSP: 19.0000 [IU] | Freq: Two times a day (BID) | SUBCUTANEOUS | Status: DC

## 2012-04-14 MED ORDER — INSULIN REGULAR HUMAN 100 UNIT/ML IJ SOLN: 14.0000 [IU] | Freq: Three times a day (TID) | INTRAMUSCULAR | Status: DC

## 2012-04-14 MED ORDER — "SYRINGE/NEEDLE (DISP) 26G X 3/8"" 1 ML MISC": Status: DC

## 2012-04-14 NOTE — Progress Notes (Signed)
Diabetes Education:  Seen today for diabetes counseling.  History of GDM with last pregnancy 4 years ago.  Following pregnancy, glucose levels remained elevated and eventually she has become insulin dependent.  Was on Lantus and Novolog insulins.  Was told to stop taking the Lantus and has not taken any for the last couple of weeks.  Was taking her Novolog by Sliding Scale, but has run out of the Novolog and has not taken any insulin for 2-3 days.  Has a Reli-On meter and is testing 3-4 times per day.  Currently Fastings are 111-123 mg/dl  AC lunch: 161-096 mg/dl, AC Dinner: 045-409 mg/dl.  Provided a True Track meter [id number] Exp: 2014/04/26, 1 box strips Lot: [id number] Exp: 2014/06/07 and 1 box lancets Lot: [id number] Exp 2015/12/10.  On return demonstration, her blood glucose was 228 following a sausage biscuit with 1/2 of a hash brown.  Consulted with Dr. Shawnie Pons and she prescribed insulin for her.  EX:[id number] R= 14 units + Novolin N= 37 units, AC Dinner: Novolin R=14units Bedtime:Novolin NPH= 19 units.  Reviewed and demonstrated mixing insulins and review of injection sites.  Provided a BD take home kit [id number] Exp: 2016/06/04.  Provided glucose log for recording blood glucose levels and insulin doses.  Provided Handout Nutrition, Diabetes and Pregnancy.  Maggie Jd Mccaster, MSN, RN, CDE

## 2012-04-14 NOTE — Progress Notes (Signed)
Nutrition note: 1st consult- RD/CDE only Pt has type 1 DM and is overweight. Pt has gained 0.8# @ [redacted]w[redacted]d, which is < expected. Pt reports eating ~2x/ d due to a lot of nausea. Pt reports drinking water, milk & soda (sprite or gingerale). Pt reports checking her BS 3-4x/d but doesn't always follow nutrition recommendations. Pt is taking PNV. Pt reports having some heartburn as well. Pt received written & verbal education on DM diet during pregnancy. Disc wt gain goals of 15-25#. Disc tips to decrease nausea & heartburn. Pt agrees to follow DM diet and include 3 meals & 3 snacks with proper CHO/ protein combination. Pt does not receive WIC but plans to apply. Pt does plan to BF. F/u in 4-6 wks Blondell Reveal, MS, RD, LDN

## 2012-04-16 LAB — RUBELLA ANTIBODY, IGM: Rubella IgM: 0.63 (ref <0.90)

## 2012-04-17 ENCOUNTER — Encounter: Payer: Self-pay | Admitting: Obstetrics & Gynecology

## 2012-04-21 ENCOUNTER — Ambulatory Visit (INDEPENDENT_AMBULATORY_CARE_PROVIDER_SITE_OTHER): Payer: Self-pay | Admitting: Family Medicine

## 2012-04-21 ENCOUNTER — Encounter: Payer: Self-pay | Admitting: Family Medicine

## 2012-04-21 VITALS — BP 107/73 | Wt 192.0 lb

## 2012-04-21 DIAGNOSIS — O9989 Other specified diseases and conditions complicating pregnancy, childbirth and the puerperium: Secondary | ICD-10-CM

## 2012-04-21 DIAGNOSIS — Z23 Encounter for immunization: Secondary | ICD-10-CM

## 2012-04-21 DIAGNOSIS — O0990 Supervision of high risk pregnancy, unspecified, unspecified trimester: Secondary | ICD-10-CM

## 2012-04-21 DIAGNOSIS — J45909 Unspecified asthma, uncomplicated: Secondary | ICD-10-CM | POA: Insufficient documentation

## 2012-04-21 DIAGNOSIS — Z283 Underimmunization status: Secondary | ICD-10-CM

## 2012-04-21 DIAGNOSIS — O09299 Supervision of pregnancy with other poor reproductive or obstetric history, unspecified trimester: Secondary | ICD-10-CM | POA: Insufficient documentation

## 2012-04-21 DIAGNOSIS — O24919 Unspecified diabetes mellitus in pregnancy, unspecified trimester: Secondary | ICD-10-CM | POA: Insufficient documentation

## 2012-04-21 LAB — POCT URINALYSIS DIP (DEVICE)
Ketones, ur: NEGATIVE mg/dL
Protein, ur: NEGATIVE mg/dL

## 2012-04-21 MED ORDER — INFLUENZA VIRUS VACC SPLIT PF IM SUSP: 0.5000 mL | INTRAMUSCULAR | Status: DC

## 2012-04-21 MED ORDER — INFLUENZA VIRUS VACC SPLIT PF IM SUSP: 0.5000 mL | Freq: Once | INTRAMUSCULAR | Status: DC

## 2012-04-21 MED ORDER — INSULIN NPH (HUMAN) (ISOPHANE) 100 UNIT/ML ~~LOC~~ SUSP: 13.0000 [IU] | Freq: Two times a day (BID) | SUBCUTANEOUS | Status: DC

## 2012-04-21 MED ORDER — INSULIN REGULAR HUMAN 100 UNIT/ML IJ SOLN: 10.0000 [IU] | Freq: Two times a day (BID) | INTRAMUSCULAR | Status: DC

## 2012-04-21 NOTE — Progress Notes (Signed)
Nuchal Translucency scheduled with MFM on 05/07/12 at 1030 am. Patient will scheduled opthamology appointment once she receives Medicaid.

## 2012-04-21 NOTE — Patient Instructions (Addendum)
Change insulin NPH (Novolin N) to 27 units in the morning and 13 units at bedtime Change insulin regular (Novolin R) to 10 units in the morning and 10 units prior to dinner Morning insulin can be mixed and should be taken within 20 minutes of the morning meal. Pregnancy - First Trimester During sexual intercourse, millions of sperm go into the vagina. Only 1 sperm will penetrate and fertilize the female egg while it is in the Fallopian tube. One week later, the fertilized egg implants into the wall of the uterus. An embryo begins to develop into a baby. At 6 to 8 weeks, the eyes and face are formed and the heartbeat can be seen on ultrasound. At the end of 12 weeks (first trimester), all the baby's organs are formed. Now that you are pregnant, you will want to do everything you can to have a healthy baby. Two of the most important things are to get good prenatal care and follow your caregiver's instructions. Prenatal care is all the medical care you receive before the baby's birth. It is given to prevent, find, and treat problems during the pregnancy and childbirth. PRENATAL EXAMS  During prenatal visits, your weight, blood pressure and urine are checked. This is done to make sure you are healthy and progressing normally during the pregnancy.  A pregnant woman should gain 25 to 35 pounds during the pregnancy. However, if you are over weight or underweight, your caregiver will advise you regarding your weight.  Your caregiver will ask and answer questions for you.  Blood work, cervical cultures, other necessary tests and a Pap test are done during your prenatal exams. These tests are done to check on your health and the probable health of your baby. Tests are strongly recommended and done for HIV with your permission. This is the virus that causes AIDS. These tests are done because medications can be given to help prevent your baby from being born with this infection should you have been infected without  knowing it. Blood work is also used to find out your blood type, previous infections and follow your blood levels (hemoglobin).  Low hemoglobin (anemia) is common during pregnancy. Iron and vitamins are given to help prevent this. Later in the pregnancy, blood tests for diabetes will be done along with any other tests if any problems develop. You may need tests to make sure you and the baby are doing well.  You may need other tests to make sure you and the baby are doing well. CHANGES DURING THE FIRST TRIMESTER (THE FIRST 3 MONTHS OF PREGNANCY) Your body goes through many changes during pregnancy. They vary from person to person. Talk to your caregiver about changes you notice and are concerned about. Changes can include:  Your menstrual period stops.  The egg and sperm carry the genes that determine what you look like. Genes from you and your partner are forming a baby. The female genes determine whether the baby is a boy or a girl.  Your body increases in girth and you may feel bloated.  Feeling sick to your stomach (nauseous) and throwing up (vomiting). If the vomiting is uncontrollable, call your caregiver.  Your breasts will begin to enlarge and become tender.  Your nipples may stick out more and become darker.  The need to urinate more. Painful urination may mean you have a bladder infection.  Tiring easily.  Loss of appetite.  Cravings for certain kinds of food.  At first, you may gain or lose  a couple of pounds.  You may have changes in your emotions from day to day (excited to be pregnant or concerned something may go wrong with the pregnancy and baby).  You may have more vivid and strange dreams. HOME CARE INSTRUCTIONS   It is very important to avoid all smoking, alcohol and un-prescribed drugs during your pregnancy. These affect the formation and growth of the baby. Avoid chemicals while pregnant to ensure the delivery of a healthy infant.  Start your prenatal visits by  the 12th week of pregnancy. They are usually scheduled monthly at first, then more often in the last 2 months before delivery. Keep your caregiver's appointments. Follow your caregiver's instructions regarding medication use, blood and lab tests, exercise, and diet.  During pregnancy, you are providing food for you and your baby. Eat regular, well-balanced meals. Choose foods such as meat, fish, milk and other low fat dairy products, vegetables, fruits, and whole-grain breads and cereals. Your caregiver will tell you of the ideal weight gain.  You can help morning sickness by keeping soda crackers at the bedside. Eat a couple before arising in the morning. You may want to use the crackers without salt on them.  Eating 4 to 5 small meals rather than 3 large meals a day also may help the nausea and vomiting.  Drinking liquids between meals instead of during meals also seems to help nausea and vomiting.  A physical sexual relationship may be continued throughout pregnancy if there are no other problems. Problems may be early (premature) leaking of amniotic fluid from the membranes, vaginal bleeding, or belly (abdominal) pain.  Exercise regularly if there are no restrictions. Check with your caregiver or physical therapist if you are unsure of the safety of some of your exercises. Greater weight gain will occur in the last 2 trimesters of pregnancy. Exercising will help:  Control your weight.  Keep you in shape.  Prepare you for labor and delivery.  Help you lose your pregnancy weight after you deliver your baby.  Wear a good support or jogging bra for breast tenderness during pregnancy. This may help if worn during sleep too.  Ask when prenatal classes are available. Begin classes when they are offered.  Do not use hot tubs, steam rooms or saunas.  Wear your seat belt when driving. This protects you and your baby if you are in an accident.  Avoid raw meat, uncooked cheese, cat litter boxes  and soil used by cats throughout the pregnancy. These carry germs that can cause birth defects in the baby.  The first trimester is a good time to visit your dentist for your dental health. Getting your teeth cleaned is OK. Use a softer toothbrush and brush gently during pregnancy.  Ask for help if you have financial, counseling or nutritional needs during pregnancy. Your caregiver will be able to offer counseling for these needs as well as refer you for other special needs.  Do not take any medications or herbs unless told by your caregiver.  Inform your caregiver if there is any mental or physical domestic violence.  Make a list of emergency phone numbers of family, friends, hospital, and police and fire departments.  Write down your questions. Take them to your prenatal visit.  Do not douche.  Do not cross your legs.  If you have to stand for long periods of time, rotate you feet or take small steps in a circle.  You may have more vaginal secretions that may require a  sanitary pad. Do not use tampons or scented sanitary pads. MEDICATIONS AND DRUG USE IN PREGNANCY  Take prenatal vitamins as directed. The vitamin should contain 1 milligram of folic acid. Keep all vitamins out of reach of children. Only a couple vitamins or tablets containing iron may be fatal to a baby or young child when ingested.  Avoid use of all medications, including herbs, over-the-counter medications, not prescribed or suggested by your caregiver. Only take over-the-counter or prescription medicines for pain, discomfort, or fever as directed by your caregiver. Do not use aspirin, ibuprofen, or naproxen unless directed by your caregiver.  Let your caregiver also know about herbs you may be using.  Alcohol is related to a number of birth defects. This includes fetal alcohol syndrome. All alcohol, in any form, should be avoided completely. Smoking will cause low birth rate and premature babies.  Street or illegal  drugs are very harmful to the baby. They are absolutely forbidden. A baby born to an addicted mother will be addicted at birth. The baby will go through the same withdrawal an adult does.  Let your caregiver know about any medications that you have to take and for what reason you take them. MISCARRIAGE IS COMMON DURING PREGNANCY A miscarriage does not mean you did something wrong. It is not a reason to worry about getting pregnant again. Your caregiver will help you with questions you may have. If you have a miscarriage, you may need minor surgery. SEEK MEDICAL CARE IF:  You have any concerns or worries during your pregnancy. It is better to call with your questions if you feel they cannot wait, rather than worry about them. SEEK IMMEDIATE MEDICAL CARE IF:   An unexplained oral temperature above 102 F (38.9 C) develops, or as your caregiver suggests.  You have leaking of fluid from the vagina (birth canal). If leaking membranes are suspected, take your temperature and inform your caregiver of this when you call.  There is vaginal spotting or bleeding. Notify your caregiver of the amount and how many pads are used.  You develop a bad smelling vaginal discharge with a change in the color.  You continue to feel sick to your stomach (nauseated) and have no relief from remedies suggested. You vomit blood or coffee ground-like materials.  You lose more than 2 pounds of weight in 1 week.  You gain more than 2 pounds of weight in 1 week and you notice swelling of your face, hands, feet, or legs.  You gain 5 pounds or more in 1 week (even if you do not have swelling of your hands, face, legs, or feet).  You get exposed to [place] measles and have never had them.  You are exposed to fifth disease or chickenpox.  You develop belly (abdominal) pain. Round ligament discomfort is a common non-cancerous (benign) cause of abdominal pain in pregnancy. Your caregiver still must evaluate this.  You  develop headache, fever, diarrhea, pain with urination, or shortness of breath.  You fall or are in a car accident or have any kind of trauma.  There is mental or physical violence in your home. Document Released: 06/19/2001 Document Revised: 09/17/2011 Document Reviewed: 12/21/2008 [facility] Patient Information 2013 [place], [place].  Gestational Diabetes Mellitus Gestational diabetes mellitus (GDM) is diabetes that occurs only during pregnancy. This happens when the body cannot properly handle the glucose (sugar) that increases in the blood after eating. During pregnancy, insulin resistance (reduced sensitivity to insulin) occurs because of the release of hormones  from the placenta. Usually, the pancreas of pregnant women produces enough insulin to overcome the resistance that occurs. However, in gestational diabetes, the insulin is there but it does not work effectively. If the resistance is severe enough that the pancreas does not produce enough insulin, extra glucose builds up in the blood.  WHO IS AT RISK FOR DEVELOPING GESTATIONAL DIABETES?  Women with a history of diabetes in the family.  Women over age 80.  Women who are overweight.  Women in certain ethnic groups (Hispanic, African American, Native American, [place] and [place] Islander). WHAT CAN HAPPEN TO THE BABY? If the mother's blood glucose is too high while she is pregnant, the extra sugar will travel through the umbilical cord to the baby. Some of the problems the baby may have are:  Large Baby - If the baby receives too much sugar, the baby will gain more weight. This may cause the baby to be too large to be born normally (vaginally) and a Cesarean section (C-section) may be needed.  Low Blood Glucose (hypoglycemia)  The baby makes extra insulin, in response to the extra sugar its gets from its mother. When the baby is born and no longer needs this extra insulin, the baby's blood glucose level may drop.  Jaundice (yellow  coloring of the skin and eyes)  This is fairly common in babies. It is caused from a build-up of the chemical called bilirubin. This is rarely serious, but is seen more often in babies whose mothers had gestational diabetes. RISKS TO THE MOTHER Women who have had gestational diabetes may be at higher risk for some problems, including:  Preeclampsia or toxemia, which includes problems with high blood pressure. Blood pressure and protein levels in the urine must be checked frequently.  Infections.  Cesarean section (C-section) for delivery.  Developing Type 2 diabetes later in life. About 30-50% will develop diabetes later, especially if obese. DIAGNOSIS  The hormones that cause insulin resistance are highest at about 24-28 weeks of pregnancy. If symptoms are experienced, they are much like symptoms you would normally expect during pregnancy.  GDM is often diagnosed using a two part method: 1. After 24-28 weeks of pregnancy, the woman drinks a glucose solution and takes a blood test. If the glucose level is high, a second test will be given. 2. Oral Glucose Tolerance Test (OGTT) which is 3 hours long  After not eating overnight, the blood glucose is checked. The woman drinks a glucose solution, and hourly blood glucose tests are taken. If the woman has risk factors for GDM, the caregiver may test earlier than 24 weeks of pregnancy. TREATMENT  Treatment of GDM is directed at keeping the mother's blood glucose level normal, and may include:  Meal planning.  Taking insulin or other medicine to control your blood glucose level.  Exercise.  Keeping a daily record of the foods you eat.  Blood glucose monitoring and keeping a record of your blood glucose levels.  May monitor ketone levels in the urine, although this is no longer considered necessary in most pregnancies. HOME CARE INSTRUCTIONS  While you are pregnant:  Follow your caregiver's advice regarding your prenatal appointments, meal  planning, exercise, medicines, vitamins, blood and other tests, and physical activities.  Keep a record of your meals, blood glucose tests, and the amount of insulin you are taking (if any). Show this to your caregiver at every prenatal visit.  If you have GDM, you may have problems with hypoglycemia (low blood glucose). You may  suspect this if you become suddenly dizzy, feel shaky, and/or weak. If you think this is happening and you have a glucose meter, try to test your blood glucose level. Follow your caregiver's advice for when and how to treat your low blood glucose. Generally, the 15:15 rule is followed: Treat by consuming 15 grams of carbohydrates, wait 15 minutes, and recheck blood glucose. Examples of 15 grams of carbohydrates are:  1 cup skim or low-fat milk.   cup juice.  3-4 glucose tablets.  5-6 hard candies.  1 small box raisins.   cup regular soda pop.  Practice good hygiene, to avoid infections.  Do not smoke. SEEK MEDICAL CARE IF:   You develop abnormal vaginal discharge, with or without itching.  You become weak and tired more than expected.  You seem to sweat a lot.  You have a sudden increase in weight, 5 pounds or more in one week.  You are losing weight, 3 pounds or more in a week.  Your blood glucose level is high, and you need instructions on what to do about it. SEEK IMMEDIATE MEDICAL CARE IF:   You develop a severe headache.  You faint or pass out.  You develop nausea and vomiting.  You become disoriented or confused.  You have a convulsion.  You develop vision problems.  You develop stomach pain.  You develop vaginal bleeding.  You develop uterine contractions.  You have leaking or a gush of fluid from the vagina. AFTER YOU HAVE THE BABY:  Go to all of your follow-up appointments, and have blood tests as advised by your caregiver.  Maintain a healthy lifestyle, to prevent diabetes in the future. This includes:  Following a  healthy meal plan.  Controlling your weight.  Getting enough exercise and proper rest.  Do not smoke.  Breastfeed your baby if you can. This will lower the chance of you and your baby developing diabetes later in life. For more information about diabetes, go to the American Diabetes Association at: PMFashions.com.cy. For more information about gestational diabetes, go to the [employer] at: RentRule.com.au. Document Released: 10/01/2000 Document Revised: 09/17/2011 Document Reviewed: 04/25/2009 [facility] [place] Patient Information 2013 [place], [place].  Breastfeeding Deciding to breastfeed is one of the best choices you can make for you and your baby. The information that follows gives a brief overview of the benefits of breastfeeding as well as common topics surrounding breastfeeding. BENEFITS OF BREASTFEEDING For the baby  The first milk (colostrum) helps the baby's digestive system function better.   There are antibodies in the mother's milk that help the baby fight off infections.   The baby has a lower incidence of asthma, allergies, and sudden infant death syndrome (SIDS).   The nutrients in breast milk are better for the baby than infant formulas, and breast milk helps the baby's brain grow better.   Babies who breastfeed have less gas, colic, and constipation.  For the mother  Breastfeeding helps develop a very special bond between the mother and her baby.   Breastfeeding is convenient, always available at the correct temperature, and costs nothing.   Breastfeeding burns calories in the mother and helps her lose weight that was gained during pregnancy.   Breastfeeding makes the uterus contract back down to normal size faster and slows bleeding following delivery.   Breastfeeding mothers have a lower risk of developing breast cancer.  BREASTFEEDING FREQUENCY  A healthy, full-term baby may breastfeed as often as  every hour or  space his or her feedings to every 3 hours.   Watch your baby for signs of hunger. Nurse your baby if he or she shows signs of hunger. How often you nurse will vary from baby to baby.   Nurse as often as the baby requests, or when you feel the need to reduce the fullness of your breasts.   Awaken the baby if it has been 3 4 hours since the last feeding.   Frequent feeding will help the mother make more milk and will help prevent problems, such as sore nipples and engorgement of the breasts.  BABY'S POSITION AT THE BREAST  Whether lying down or sitting, be sure that the baby's tummy is facing your tummy.   Support the breast with 4 fingers underneath the breast and the thumb above. Make sure your fingers are well away from the nipple and baby's mouth.   Stroke the baby's lips gently with your finger or nipple.   When the baby's mouth is open wide enough, place all of your nipple and as much of the areola as possible into your baby's mouth.   Pull the baby in close so the tip of the nose and the baby's cheeks touch the breast during the feeding.  FEEDINGS AND SUCTION  The length of each feeding varies from baby to baby and from feeding to feeding.   The baby must suck about 2 3 minutes for your milk to get to him or her. This is called a "let down." For this reason, allow the baby to feed on each breast as long as he or she wants. Your baby will end the feeding when he or she has received the right balance of nutrients.   To break the suction, put your finger into the corner of the baby's mouth and slide it between his or her gums before removing your breast from his or her mouth. This will help prevent sore nipples.  HOW TO TELL WHETHER YOUR BABY IS GETTING ENOUGH BREAST MILK. Wondering whether or not your baby is getting enough milk is a common concern among mothers. You can be assured that your baby is getting enough milk if:   Your baby is actively sucking  and you hear swallowing.   Your baby seems relaxed and satisfied after a feeding.   Your baby nurses at least 8 12 times in a 24 hour time period. Nurse your baby until he or she unlatches or falls asleep at the first breast (at least 10 20 minutes), then offer the second side.   Your baby is wetting 5 6 disposable diapers (6 8 cloth diapers) in a 24 hour period by 55 49 days of age.   Your baby is having at least 3 4 stools every 24 hours for the first 6 weeks. The stool should be soft and yellow.   Your baby should gain 4 7 ounces per week after he or she is 26 days old.   Your breasts feel softer after nursing.  REDUCING BREAST ENGORGEMENT  In the first week after your baby is born, you may experience signs of breast engorgement. When breasts are engorged, they feel heavy, warm, full, and may be tender to the touch. You can reduce engorgement if you:   Nurse frequently, every 2 3 hours. Mothers who breastfeed early and often have fewer problems with engorgement.   Place light ice packs on your breasts for 10 20 minutes between feedings. This reduces swelling. Wrap the ice packs in  a lightweight towel to protect your skin. Bags of frozen vegetables work well for this purpose.   Take a warm shower or apply warm, moist heat to your breast for 5 10 minutes just before each feeding. This increases circulation and helps the milk flow.   Gently massage your breast before and during the feeding. Using your finger tips, massage from the chest wall towards your nipple in a circular motion.   Make sure that the baby empties at least one breast at every feeding before switching sides.   Use a breast pump to empty the breasts if your baby is sleepy or not nursing well. You may also want to pump if you are returning to work oryou feel you are getting engorged.   Avoid bottle feeds, pacifiers, or supplemental feedings of water or juice in place of breastfeeding. Breast milk is all the  food your baby needs. It is not necessary for your baby to have water or formula. In fact, to help your breasts make more milk, it is best not to give your baby supplemental feedings during the early weeks.   Be sure the baby is latched on and positioned properly while breastfeeding.   Wear a supportive bra, avoiding underwire styles.   Eat a balanced diet with enough fluids.   Rest often, relax, and take your prenatal vitamins to prevent fatigue, stress, and anemia.  If you follow these suggestions, your engorgement should improve in 24 48 hours. If you are still experiencing difficulty, call your lactation consultant or caregiver.  CARING FOR YOURSELF Take care of your breasts  Bathe or shower daily.   Avoid using soap on your nipples.   Start feedings on your left breast at one feeding and on your right breast at the next feeding.   You will notice an increase in your milk supply 2 5 days after delivery. You may feel some discomfort from engorgement, which makes your breasts very firm and often tender. Engorgement "peaks" out within 24 48 hours. In the meantime, apply warm moist towels to your breasts for 5 10 minutes before feeding. Gentle massage and expression of some milk before feeding will soften your breasts, making it easier for your baby to latch on.   Wear a well-fitting nursing bra, and air dry your nipples for a 3 after each feeding.   Only use cotton bra pads.   Only use pure lanolin on your nipples after nursing. You do not need to wash it off before feeding the baby again. Another option is to express a few drops of breast milk and gently massage it into your nipples.  Take care of yourself  Eat well-balanced meals and nutritious snacks.   Drinking milk, fruit juice, and water to satisfy your thirst (about 8 glasses a day).   Get plenty of rest.  Avoid foods that you notice affect the baby in a bad way.  SEEK MEDICAL CARE IF:   You have  difficulty with breastfeeding and need help.   You have a hard, red, sore area on your breast that is accompanied by a fever.   Your baby is too sleepy to eat well or is having trouble sleeping.   Your baby is wetting less than 6 diapers a day, by 55 days of age.   Your baby's skin or white part of his or her eyes is more yellow than it was in the hospital.   You feel depressed.  Document Released: 06/25/2005 Document Revised: 12/25/2011 Document  Reviewed: 09/23/2011 [facility] Patient Information 2013 [place], [place].

## 2012-04-21 NOTE — Progress Notes (Signed)
Pulse: 76

## 2012-04-21 NOTE — Progress Notes (Signed)
FBS 83-112 2 hour post meals-[id number]--still working on diet.  Adjusted her insulin back down based on weight.  May go back to Novolog if she gets insurance. Schedule first screen Flu shot today

## 2012-04-28 ENCOUNTER — Ambulatory Visit (INDEPENDENT_AMBULATORY_CARE_PROVIDER_SITE_OTHER): Payer: Self-pay | Admitting: Obstetrics & Gynecology

## 2012-04-28 VITALS — BP 111/73 | Temp 97.0°F | Wt 188.0 lb

## 2012-04-28 DIAGNOSIS — O24919 Unspecified diabetes mellitus in pregnancy, unspecified trimester: Secondary | ICD-10-CM

## 2012-04-28 LAB — POCT URINALYSIS DIP (DEVICE)
Glucose, UA: NEGATIVE mg/dL
Nitrite: NEGATIVE
Specific Gravity, Urine: >=1.03 (ref 1.005–1.030)
Urobilinogen, UA: 0.2 mg/dL (ref 0.0–1.0)
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
## 2012-04-28 NOTE — Progress Notes (Signed)
Nutrition Note: Client requested visit due to wt. Loss Diet hx:  3 meals and 1-2 snacks most days.  Decreased appetite at times.  Some nausea.  PNV daily.   Wt loss of 4# in one week.  Now back to pg wt.  Pt. Reports eating healthier than usual.   Discussed tips for nausea.  Discussed wt. Gain goals of 15-25#.  Pt. Agrees to 3 meals and 2-3 snacks/d. F/U as needed. Candice C. Earlene Plater, MPH, RD, LDN

## 2012-04-28 NOTE — Progress Notes (Signed)
Pulse: 75 Would like to see nutrition today has concerns about weight.

## 2012-04-29 ENCOUNTER — Inpatient Hospital Stay (HOSPITAL_COMMUNITY)
Admission: AD | Admit: 2012-04-29 | Discharge: 2012-04-29 | Disposition: A | Discharge status: Home / Self Care | Payer: Self-pay | Source: Ambulatory Visit | Attending: Obstetrics & Gynecology | Admitting: Obstetrics & Gynecology

## 2012-04-29 ENCOUNTER — Encounter (HOSPITAL_COMMUNITY): Payer: Self-pay | Admitting: Family

## 2012-04-29 DIAGNOSIS — O09299 Supervision of pregnancy with other poor reproductive or obstetric history, unspecified trimester: Secondary | ICD-10-CM

## 2012-04-29 DIAGNOSIS — W108XXA Fall (on) (from) other stairs and steps, initial encounter: Secondary | ICD-10-CM | POA: Insufficient documentation

## 2012-04-29 DIAGNOSIS — O99891 Other specified diseases and conditions complicating pregnancy: Secondary | ICD-10-CM | POA: Insufficient documentation

## 2012-04-29 DIAGNOSIS — O0990 Supervision of high risk pregnancy, unspecified, unspecified trimester: Secondary | ICD-10-CM

## 2012-04-29 DIAGNOSIS — W19XXXA Unspecified fall, initial encounter: Secondary | ICD-10-CM

## 2012-04-29 DIAGNOSIS — O24919 Unspecified diabetes mellitus in pregnancy, unspecified trimester: Secondary | ICD-10-CM | POA: Insufficient documentation

## 2012-04-29 DIAGNOSIS — R109 Unspecified abdominal pain: Secondary | ICD-10-CM | POA: Insufficient documentation

## 2012-04-29 DIAGNOSIS — E119 Type 2 diabetes mellitus without complications: Secondary | ICD-10-CM | POA: Insufficient documentation

## 2012-04-29 DIAGNOSIS — Z283 Underimmunization status: Secondary | ICD-10-CM

## 2012-04-29 LAB — GLUCOSE, CAPILLARY: Glucose-Capillary: 107 mg/dL — ABNORMAL HIGH (ref 70–99)

## 2012-04-29 MED ORDER — ACETAMINOPHEN 500 MG PO TABS
1000.0000 mg | ORAL_TABLET | Freq: Once | ORAL | Status: AC
  Administered 2012-04-29: 1000 mg via ORAL
  Filled 2012-04-29: qty 2

## 2012-04-29 MED ORDER — OXYCODONE-ACETAMINOPHEN 5-325 MG PO TABS: 1.0000 | ORAL_TABLET | ORAL | Status: DC | PRN

## 2012-04-29 NOTE — MAU Provider Note (Signed)
Attestation of Attending Supervision of Advanced Practitioner (CNM/NP): Evaluation and management procedures were performed by the Advanced Practitioner under my supervision and collaboration.  I have reviewed the Advanced Practitioner's note and chart, and I agree with the management and plan.  Estanislao Harmon, MD, FACOG Attending Obstetrician & Gynecologist Faculty Practice, [facility]  

## 2012-04-29 NOTE — MAU Note (Signed)
about 0200; sugar dropped (48)  Went down to get something to eat, and fell going down the steps.  Landed on rt side.  Since then had been cramping.

## 2012-04-29 NOTE — MAU Note (Signed)
Pt states she fell down stairs at 0200 this am; was going downstairs to get something to eat because CBG was 48. Missed a step and fell forward then rolled; denies bleeding, cramping felt immediately.

## 2012-04-29 NOTE — MAU Provider Note (Signed)
Chief Complaint: Fall and Abdominal Pain   First Provider Initiated Contact with Patient 04/29/12 1215     SUBJECTIVE HPI: Christie [name] is a 26 y.o. G3P1011 at [redacted]w[redacted]d by LMP who presents with history of tripping down stairs today at 0200 and having lower abdominal cramping since that episode. She also has mild right-sided pain.She fell forward landed on her abdomen. Denies vaginal bleeding states her blood sugar was 48 at the time. She states her CBGs are well controlled and it's care at [facility] clinic. Last night she forgot to eat her snack though she took her hs insulin. Has frontal headache. Did not strike her head or get any overt injuries.   Past Medical History  Diagnosis Date  . Asthma   . Hx of migraines   . Anemia   . Depression   . Fx ankle     history of left ankle fx at age 58  . Pregnancy induced hypertension   . Diabetes mellitus     type 1 on insulin  . Headache   . Urinary tract infection   . Chlamydia   . Abnormal Pap smear     colpo scheduled  . Anxiety    OB History    Grav Para Term Preterm Abortions TAB SAB Ect Mult Living   3 1 1  0 1 1 0 0 0 1     # Outc Date GA Lbr Len/2nd Wgt Sex Del Anes PTL Lv   1 TRM 3/09   9lb6oz(4.252kg) M SVD EPI No Yes   Comments: pre eclampsia   2 TAB 6/13 [redacted]w[redacted]d          Comments: no complications   3 CUR              Past Surgical History  Procedure Date  . Wisdom tooth extraction   . Colposcopy    History   Social History  . Marital Status: Single    Spouse Name: N/A    Number of Children: N/A  . Years of Education: N/A   Occupational History  . Not on file.   Social History Main Topics  . Smoking status: Never Smoker   . Smokeless tobacco: Never Used  . Alcohol Use: No     occasional (once a month)- not (+) UPT  . Drug Use: Yes    Special: Marijuana     last used 1 years ago  . Sexually Active: Yes    Birth Control/ Protection: None   Other Topics Concern  . Not on file   Social History  Narrative  . No narrative on file   No current facility-administered medications on file prior to encounter.   Current Outpatient Prescriptions on File Prior to Encounter  Medication Sig Dispense Refill  . insulin NPH (HUMULIN N,NOVOLIN N) 100 UNIT/ML injection Inject 13-27 Units into the skin 2 (two) times daily. 27 u sq q am, 13 u sq q hs  10 mL  3  . insulin regular (HUMULIN R) 100 units/mL injection Inject 0.1 mLs (10 Units total) into the skin 2 (two) times daily before a meal. 10 u sq q am, and 10 u sq q ac dinner (evening meal)  10 mL  12  . albuterol (PROVENTIL HFA;VENTOLIN HFA) 108 (90 BASE) MCG/ACT inhaler Inhale 2 puffs into the lungs every 6 (six) hours as needed. For shortness of breath or wheezing       Allergies  Allergen Reactions  . Banana Anaphylaxis  . Citrus  Other (See Comments)    Gums bleed  . Adhesive (Tape) Rash    ROS: Pertinent items in HPI  OBJECTIVE Blood pressure 113/66, pulse 93, temperature 98.3 F (36.8 C), temperature source Oral, resp. rate 20, height 5\' 9"  (1.753 m), weight 86.183 kg (190 lb), last menstrual period 02/11/2012. DT FHR 160  GENERAL: Well-developed, well-nourished female in no acute distress.  SKIN: No bruising HEENT: Normocephalic HEART: normal rate RESP: normal effort ABDOMEN: Soft, non-tender BACK: Neg CVAT. Mild TTP rt side mid-axillary line EXTREMITIES: Nontender, no edema NEURO: Alert and oriented LAB RESULTS Results for orders placed during the hospital encounter of 04/29/12 (from the past 24 hour(s))  GLUCOSE, CAPILLARY     Status: Abnormal   Collection Time   04/29/12 11:57 AM      Component Value Range   Glucose-Capillary 107 (*) 70 - 99 mg/dL   Urinalysis    Component Value Date/Time   COLORURINE YELLOW 04/07/2012 1650   APPEARANCEUR CLEAR 04/07/2012 1650   LABSPEC >=1.030 04/28/2012 0919   PHURINE 6.0 04/28/2012 0919   GLUCOSEU NEGATIVE 04/28/2012 0919   HGBUR NEGATIVE 04/28/2012 0919   BILIRUBINUR NEGATIVE  04/28/2012 0919   KETONESUR NEGATIVE 04/28/2012 0919   PROTEINUR NEGATIVE 04/28/2012 0919   UROBILINOGEN 0.2 04/28/2012 0919   NITRITE NEGATIVE 04/28/2012 0919   LEUKOCYTESUR TRACE* 04/28/2012 0919        ASSESSMENT 1. Diabetes mellitus, antepartum   2. H/O pre-eclampsia in prior pregnancy, currently pregnant   3. Unspecified high-risk pregnancy   4. Rubella non-immune status, antepartum   G3P1011 at [redacted]w[redacted]d s/p fall with muscle strain and hypoglycemic episode  PLAN Reassured re: FHR. Return if any VB or pain worse.  Tylenol for pain, Rx Percocet (#10 dispensed) to take if Tylenol ineffective; pelvic rest until cramping resolves Discharge home    Medication List     As of 04/29/2012 12:36 PM    ASK your doctor about these medications         albuterol 108 (90 BASE) MCG/ACT inhaler   Commonly known as: PROVENTIL HFA;VENTOLIN HFA   Inhale 2 puffs into the lungs every 6 (six) hours as needed. For shortness of breath or wheezing      insulin NPH 100 UNIT/ML injection   Commonly known as: HUMULIN N,NOVOLIN N   Inject 13-27 Units into the skin 2 (two) times daily. 27 u sq q am, 13 u sq q hs      insulin regular 100 units/mL injection   Commonly known as: NOVOLIN R,HUMULIN R   Inject 0.1 mLs (10 Units total) into the skin 2 (two) times daily before a meal. 10 u sq q am, and 10 u sq q ac dinner (evening meal)      prenatal multivitamin Tabs   Take 1 tablet by mouth every morning.          Danae Orleans, CNM 04/29/2012  12:18 PM

## 2012-05-02 ENCOUNTER — Encounter: Payer: Self-pay | Admitting: Obstetrics & Gynecology

## 2012-05-07 ENCOUNTER — Ambulatory Visit (HOSPITAL_COMMUNITY): Payer: Self-pay

## 2012-05-07 ENCOUNTER — Other Ambulatory Visit (HOSPITAL_COMMUNITY): Payer: Self-pay

## 2012-05-08 ENCOUNTER — Encounter: Payer: Self-pay | Admitting: Family Medicine

## 2012-05-08 ENCOUNTER — Ambulatory Visit (HOSPITAL_COMMUNITY)
Admission: RE | Admit: 2012-05-08 | Discharge: 2012-05-08 | Disposition: A | Discharge status: Home / Self Care | Payer: Self-pay | Source: Ambulatory Visit | Attending: Advanced Practice Midwife | Admitting: Advanced Practice Midwife

## 2012-05-08 ENCOUNTER — Ambulatory Visit (HOSPITAL_COMMUNITY): Payer: Self-pay

## 2012-05-08 ENCOUNTER — Ambulatory Visit (HOSPITAL_COMMUNITY)
Admission: RE | Admit: 2012-05-08 | Discharge: 2012-05-08 | Disposition: A | Discharge status: Home / Self Care | Payer: Self-pay | Source: Ambulatory Visit | Attending: Family Medicine | Admitting: Family Medicine

## 2012-05-08 ENCOUNTER — Other Ambulatory Visit: Payer: Self-pay

## 2012-05-08 VITALS — BP 113/66 | HR 62 | Wt 192.0 lb

## 2012-05-08 DIAGNOSIS — O351XX0 Maternal care for (suspected) chromosomal abnormality in fetus, not applicable or unspecified: Secondary | ICD-10-CM | POA: Insufficient documentation

## 2012-05-08 DIAGNOSIS — Z283 Underimmunization status: Secondary | ICD-10-CM

## 2012-05-08 DIAGNOSIS — O09299 Supervision of pregnancy with other poor reproductive or obstetric history, unspecified trimester: Secondary | ICD-10-CM | POA: Insufficient documentation

## 2012-05-08 DIAGNOSIS — Z3689 Encounter for other specified antenatal screening: Secondary | ICD-10-CM | POA: Insufficient documentation

## 2012-05-08 DIAGNOSIS — O3510X0 Maternal care for (suspected) chromosomal abnormality in fetus, unspecified, not applicable or unspecified: Secondary | ICD-10-CM | POA: Insufficient documentation

## 2012-05-08 DIAGNOSIS — O0990 Supervision of high risk pregnancy, unspecified, unspecified trimester: Secondary | ICD-10-CM

## 2012-05-08 DIAGNOSIS — O24919 Unspecified diabetes mellitus in pregnancy, unspecified trimester: Secondary | ICD-10-CM | POA: Insufficient documentation

## 2012-05-19 ENCOUNTER — Encounter: Payer: Self-pay | Admitting: Family Medicine

## 2012-05-19 ENCOUNTER — Ambulatory Visit (INDEPENDENT_AMBULATORY_CARE_PROVIDER_SITE_OTHER): Payer: Self-pay | Admitting: Family Medicine

## 2012-05-19 VITALS — BP 109/73 | Temp 98.2°F | Wt 188.0 lb

## 2012-05-19 DIAGNOSIS — O24919 Unspecified diabetes mellitus in pregnancy, unspecified trimester: Secondary | ICD-10-CM

## 2012-05-19 DIAGNOSIS — O0990 Supervision of high risk pregnancy, unspecified, unspecified trimester: Secondary | ICD-10-CM

## 2012-05-19 LAB — POCT URINALYSIS DIP (DEVICE)
Glucose, UA: NEGATIVE mg/dL
Nitrite: NEGATIVE
Specific Gravity, Urine: 1.02 (ref 1.005–1.030)
Urobilinogen, UA: 0.2 mg/dL (ref 0.0–1.0)

## 2012-05-19 MED ORDER — FLUCONAZOLE 150 MG PO TABS: 150.0000 mg | ORAL_TABLET | Freq: Every day | ORAL | Status: DC

## 2012-05-19 NOTE — Progress Notes (Signed)
No book today--reports PP BS low 100's and fastings in the 90's.  Anatomy scan ordered AFP at next visit.

## 2012-05-19 NOTE — Patient Instructions (Signed)
Gestational Diabetes Mellitus Gestational diabetes mellitus (GDM) is diabetes that occurs only during pregnancy. This happens when the body cannot properly handle the glucose (sugar) that increases in the blood after eating. During pregnancy, insulin resistance (reduced sensitivity to insulin) occurs because of the release of hormones from the placenta. Usually, the pancreas of pregnant women produces enough insulin to overcome the resistance that occurs. However, in gestational diabetes, the insulin is there but it does not work effectively. If the resistance is severe enough that the pancreas does not produce enough insulin, extra glucose builds up in the blood.  WHO IS AT RISK FOR DEVELOPING GESTATIONAL DIABETES?  Women with a history of diabetes in the family.  Women over age 25.  Women who are overweight.  Women in certain ethnic groups (Hispanic, African American, Native American, Asian and Pacific Islander). WHAT CAN HAPPEN TO THE BABY? If the mother's blood glucose is too high while she is pregnant, the extra sugar will travel through the umbilical cord to the baby. Some of the problems the baby may have are:  Large Baby - If the baby receives too much sugar, the baby will gain more weight. This may cause the baby to be too large to be born normally (vaginally) and a Cesarean section (C-section) may be needed.  Low Blood Glucose (hypoglycemia)  The baby makes extra insulin, in response to the extra sugar its gets from its mother. When the baby is born and no longer needs this extra insulin, the baby's blood glucose level may drop.  Jaundice (yellow coloring of the skin and eyes)  This is fairly common in babies. It is caused from a build-up of the chemical called bilirubin. This is rarely serious, but is seen more often in babies whose mothers had gestational diabetes. RISKS TO THE MOTHER Women who have had gestational diabetes may be at higher risk for some problems,  including:  Preeclampsia or toxemia, which includes problems with high blood pressure. Blood pressure and protein levels in the urine must be checked frequently.  Infections.  Cesarean section (C-section) for delivery.  Developing Type 2 diabetes later in life. About 30-50% will develop diabetes later, especially if obese. DIAGNOSIS  The hormones that cause insulin resistance are highest at about 24-28 weeks of pregnancy. If symptoms are experienced, they are much like symptoms you would normally expect during pregnancy.  GDM is often diagnosed using a two part method: 1. After 24-28 weeks of pregnancy, the woman drinks a glucose solution and takes a blood test. If the glucose level is high, a second test will be given. 2. Oral Glucose Tolerance Test (OGTT) which is 3 hours long  After not eating overnight, the blood glucose is checked. The woman drinks a glucose solution, and hourly blood glucose tests are taken. If the woman has risk factors for GDM, the caregiver may test earlier than 24 weeks of pregnancy. TREATMENT  Treatment of GDM is directed at keeping the mother's blood glucose level normal, and may include:  Meal planning.  Taking insulin or other medicine to control your blood glucose level.  Exercise.  Keeping a daily record of the foods you eat.  Blood glucose monitoring and keeping a record of your blood glucose levels.  May monitor ketone levels in the urine, although this is no longer considered necessary in most pregnancies. HOME CARE INSTRUCTIONS  While you are pregnant:  Follow your caregiver's advice regarding your prenatal appointments, meal planning, exercise, medicines, vitamins, blood and other tests, and physical   activities.  Keep a record of your meals, blood glucose tests, and the amount of insulin you are taking (if any). Show this to your caregiver at every prenatal visit.  If you have GDM, you may have problems with hypoglycemia (low blood glucose).  You may suspect this if you become suddenly dizzy, feel shaky, and/or weak. If you think this is happening and you have a glucose meter, try to test your blood glucose level. Follow your caregiver's advice for when and how to treat your low blood glucose. Generally, the 15:15 rule is followed: Treat by consuming 15 grams of carbohydrates, wait 15 minutes, and recheck blood glucose. Examples of 15 grams of carbohydrates are:  1 cup skim or low-fat milk.   cup juice.  3-4 glucose tablets.  5-6 hard candies.  1 small box raisins.   cup regular soda pop.  Practice good hygiene, to avoid infections.  Do not smoke. SEEK MEDICAL CARE IF:   You develop abnormal vaginal discharge, with or without itching.  You become weak and tired more than expected.  You seem to sweat a lot.  You have a sudden increase in weight, 5 pounds or more in one week.  You are losing weight, 3 pounds or more in a week.  Your blood glucose level is high, and you need instructions on what to do about it. SEEK IMMEDIATE MEDICAL CARE IF:   You develop a severe headache.  You faint or pass out.  You develop nausea and vomiting.  You become disoriented or confused.  You have a convulsion.  You develop vision problems.  You develop stomach pain.  You develop vaginal bleeding.  You develop uterine contractions.  You have leaking or a gush of fluid from the vagina. AFTER YOU HAVE THE BABY:  Go to all of your follow-up appointments, and have blood tests as advised by your caregiver.  Maintain a healthy lifestyle, to prevent diabetes in the future. This includes:  Following a healthy meal plan.  Controlling your weight.  Getting enough exercise and proper rest.  Do not smoke.  Breastfeed your baby if you can. This will lower the chance of you and your baby developing diabetes later in life. For more information about diabetes, go to the American Diabetes Association at:  www.americandiabetesassociation.org. For more information about gestational diabetes, go to the American Congress of Obstetricians and Gynecologists at: www.acog.org. Document Released: 10/01/2000 Document Revised: 09/17/2011 Document Reviewed: 04/25/2009 ExitCare Patient Information 2013 [employer].  Breastfeeding Deciding to breastfeed is one of the best choices you can make for you and your baby. The information that follows gives a brief overview of the benefits of breastfeeding as well as common topics surrounding breastfeeding. BENEFITS OF BREASTFEEDING For the baby  The first milk (colostrum) helps the baby's digestive system function better.   There are antibodies in the mother's milk that help the baby fight off infections.   The baby has a lower incidence of asthma, allergies, and sudden infant death syndrome (SIDS).   The nutrients in breast milk are better for the baby than infant formulas, and breast milk helps the baby's brain grow better.   Babies who breastfeed have less gas, colic, and constipation.  For the mother  Breastfeeding helps develop a very special bond between the mother and her baby.   Breastfeeding is convenient, always available at the correct temperature, and costs nothing.   Breastfeeding burns calories in the mother and helps her lose weight that was gained during pregnancy.     Breastfeeding makes the uterus contract back down to normal size faster and slows bleeding following delivery.   Breastfeeding mothers have a lower risk of developing breast cancer.  BREASTFEEDING FREQUENCY  A healthy, full-term baby may breastfeed as often as every hour or space his or her feedings to every 3 hours.   Watch your baby for signs of hunger. Nurse your baby if he or she shows signs of hunger. How often you nurse will vary from baby to baby.   Nurse as often as the baby requests, or when you feel the need to reduce the fullness of your breasts.    Awaken the baby if it has been 3 4 hours since the last feeding.   Frequent feeding will help the mother make more milk and will help prevent problems, such as sore nipples and engorgement of the breasts.  BABY'S POSITION AT THE BREAST  Whether lying down or sitting, be sure that the baby's tummy is facing your tummy.   Support the breast with 4 fingers underneath the breast and the thumb above. Make sure your fingers are well away from the nipple and baby's mouth.   Stroke the baby's lips gently with your finger or nipple.   When the baby's mouth is open wide enough, place all of your nipple and as much of the areola as possible into your baby's mouth.   Pull the baby in close so the tip of the nose and the baby's cheeks touch the breast during the feeding.  FEEDINGS AND SUCTION  The length of each feeding varies from baby to baby and from feeding to feeding.   The baby must suck about 2 3 minutes for your milk to get to him or her. This is called a "let down." For this reason, allow the baby to feed on each breast as long as he or she wants. Your baby will end the feeding when he or she has received the right balance of nutrients.   To break the suction, put your finger into the corner of the baby's mouth and slide it between his or her gums before removing your breast from his or her mouth. This will help prevent sore nipples.  HOW TO TELL WHETHER YOUR BABY IS GETTING ENOUGH BREAST MILK. Wondering whether or not your baby is getting enough milk is a common concern among mothers. You can be assured that your baby is getting enough milk if:   Your baby is actively sucking and you hear swallowing.   Your baby seems relaxed and satisfied after a feeding.   Your baby nurses at least 8 12 times in a 24 hour time period. Nurse your baby until he or she unlatches or falls asleep at the first breast (at least 10 20 minutes), then offer the second side.   Your baby is wetting  5 6 disposable diapers (6 8 cloth diapers) in a 24 hour period by 5 6 days of age.   Your baby is having at least 3 4 stools every 24 hours for the first 6 weeks. The stool should be soft and yellow.   Your baby should gain 4 7 ounces per week after he or she is 4 days old.   Your breasts feel softer after nursing.  REDUCING BREAST ENGORGEMENT  In the first week after your baby is born, you may experience signs of breast engorgement. When breasts are engorged, they feel heavy, warm, full, and may be tender to the touch. You can reduce   engorgement if you:   Nurse frequently, every 2 3 hours. Mothers who breastfeed early and often have fewer problems with engorgement.   Place light ice packs on your breasts for 10 20 minutes between feedings. This reduces swelling. Wrap the ice packs in a lightweight towel to protect your skin. Bags of frozen vegetables work well for this purpose.   Take a warm shower or apply warm, moist heat to your breast for 5 10 minutes just before each feeding. This increases circulation and helps the milk flow.   Gently massage your breast before and during the feeding. Using your finger tips, massage from the chest wall towards your nipple in a circular motion.   Make sure that the baby empties at least one breast at every feeding before switching sides.   Use a breast pump to empty the breasts if your baby is sleepy or not nursing well. You may also want to pump if you are returning to work oryou feel you are getting engorged.   Avoid bottle feeds, pacifiers, or supplemental feedings of water or juice in place of breastfeeding. Breast milk is all the food your baby needs. It is not necessary for your baby to have water or formula. In fact, to help your breasts make more milk, it is best not to give your baby supplemental feedings during the early weeks.   Be sure the baby is latched on and positioned properly while breastfeeding.   Wear a supportive  bra, avoiding underwire styles.   Eat a balanced diet with enough fluids.   Rest often, relax, and take your prenatal vitamins to prevent fatigue, stress, and anemia.  If you follow these suggestions, your engorgement should improve in 24 48 hours. If you are still experiencing difficulty, call your lactation consultant or caregiver.  CARING FOR YOURSELF Take care of your breasts  Bathe or shower daily.   Avoid using soap on your nipples.   Start feedings on your left breast at one feeding and on your right breast at the next feeding.   You will notice an increase in your milk supply 2 5 days after delivery. You may feel some discomfort from engorgement, which makes your breasts very firm and often tender. Engorgement "peaks" out within 24 48 hours. In the meantime, apply warm moist towels to your breasts for 5 10 minutes before feeding. Gentle massage and expression of some milk before feeding will soften your breasts, making it easier for your baby to latch on.   Wear a well-fitting nursing bra, and air dry your nipples for a 3 4minutes after each feeding.   Only use cotton bra pads.   Only use pure lanolin on your nipples after nursing. You do not need to wash it off before feeding the baby again. Another option is to express a few drops of breast milk and gently massage it into your nipples.  Take care of yourself  Eat well-balanced meals and nutritious snacks.   Drinking milk, fruit juice, and water to satisfy your thirst (about 8 glasses a day).   Get plenty of rest.  Avoid foods that you notice affect the baby in a bad way.  SEEK MEDICAL CARE IF:   You have difficulty with breastfeeding and need help.   You have a hard, red, sore area on your breast that is accompanied by a fever.   Your baby is too sleepy to eat well or is having trouble sleeping.   Your baby is wetting less than   6 diapers a day, by 5 days of age.   Your baby's skin or white part of  his or her eyes is more yellow than it was in the hospital.   You feel depressed.  Document Released: 06/25/2005 Document Revised: 12/25/2011 Document Reviewed: 09/23/2011 Exit[employer] Patient Information 2013 [employer].  

## 2012-05-19 NOTE — Progress Notes (Signed)
Pulse 91. C/o abdominal cramps. No pressure.  Vaginal d/c as white curdy with itch.

## 2012-05-19 NOTE — Progress Notes (Signed)
Anatomy ultrasound scheduled with MFM on 06/17/12 at 11 am.

## 2012-05-20 LAB — CULTURE, OB URINE: Colony Count: 50000

## 2012-05-20 LAB — GC/CHLAMYDIA PROBE AMP, URINE: GC Probe Amp, Urine: NEGATIVE

## 2012-06-02 ENCOUNTER — Encounter: Payer: Self-pay | Admitting: Family Medicine

## 2012-06-02 ENCOUNTER — Ambulatory Visit (INDEPENDENT_AMBULATORY_CARE_PROVIDER_SITE_OTHER): Payer: Self-pay | Admitting: Family Medicine

## 2012-06-02 VITALS — BP 114/68 | Temp 98.1°F | Wt 188.7 lb

## 2012-06-02 DIAGNOSIS — O24919 Unspecified diabetes mellitus in pregnancy, unspecified trimester: Secondary | ICD-10-CM

## 2012-06-02 DIAGNOSIS — J45909 Unspecified asthma, uncomplicated: Secondary | ICD-10-CM

## 2012-06-02 LAB — POCT URINALYSIS DIP (DEVICE)
Nitrite: NEGATIVE
Protein, ur: NEGATIVE mg/dL
Specific Gravity, Urine: 1.025 (ref 1.005–1.030)
Urobilinogen, UA: 0.2 mg/dL (ref 0.0–1.0)

## 2012-06-02 MED ORDER — ALBUTEROL SULFATE HFA 108 (90 BASE) MCG/ACT IN AERS: 2.0000 | INHALATION_SPRAY | Freq: Four times a day (QID) | RESPIRATORY_TRACT | Status: DC | PRN

## 2012-06-02 NOTE — Progress Notes (Signed)
Pulse: 88 Needs new inhaler, hers is expired.

## 2012-06-02 NOTE — Patient Instructions (Signed)
Gestational Diabetes Mellitus Gestational diabetes mellitus (GDM) is diabetes that occurs only during pregnancy. This happens when the body cannot properly handle the glucose (sugar) that increases in the blood after eating. During pregnancy, insulin resistance (reduced sensitivity to insulin) occurs because of the release of hormones from the placenta. Usually, the pancreas of pregnant women produces enough insulin to overcome the resistance that occurs. However, in gestational diabetes, the insulin is there but it does not work effectively. If the resistance is severe enough that the pancreas does not produce enough insulin, extra glucose builds up in the blood.  WHO IS AT RISK FOR DEVELOPING GESTATIONAL DIABETES?  Women with a history of diabetes in the family.  Women over age 25.  Women who are overweight.  Women in certain ethnic groups (Hispanic, African American, Native American, Asian and Pacific Islander). WHAT CAN HAPPEN TO THE BABY? If the mother's blood glucose is too high while she is pregnant, the extra sugar will travel through the umbilical cord to the baby. Some of the problems the baby may have are:  Large Baby - If the baby receives too much sugar, the baby will gain more weight. This may cause the baby to be too large to be born normally (vaginally) and a Cesarean section (C-section) may be needed.  Low Blood Glucose (hypoglycemia)  The baby makes extra insulin, in response to the extra sugar its gets from its mother. When the baby is born and no longer needs this extra insulin, the baby's blood glucose level may drop.  Jaundice (yellow coloring of the skin and eyes)  This is fairly common in babies. It is caused from a build-up of the chemical called bilirubin. This is rarely serious, but is seen more often in babies whose mothers had gestational diabetes. RISKS TO THE MOTHER Women who have had gestational diabetes may be at higher risk for some problems,  including:  Preeclampsia or toxemia, which includes problems with high blood pressure. Blood pressure and protein levels in the urine must be checked frequently.  Infections.  Cesarean section (C-section) for delivery.  Developing Type 2 diabetes later in life. About 30-50% will develop diabetes later, especially if obese. DIAGNOSIS  The hormones that cause insulin resistance are highest at about 24-28 weeks of pregnancy. If symptoms are experienced, they are much like symptoms you would normally expect during pregnancy.  GDM is often diagnosed using a two part method: 1. After 24-28 weeks of pregnancy, the woman drinks a glucose solution and takes a blood test. If the glucose level is high, a second test will be given. 2. Oral Glucose Tolerance Test (OGTT) which is 3 hours long  After not eating overnight, the blood glucose is checked. The woman drinks a glucose solution, and hourly blood glucose tests are taken. If the woman has risk factors for GDM, the caregiver may test earlier than 24 weeks of pregnancy. TREATMENT  Treatment of GDM is directed at keeping the mother's blood glucose level normal, and may include:  Meal planning.  Taking insulin or other medicine to control your blood glucose level.  Exercise.  Keeping a daily record of the foods you eat.  Blood glucose monitoring and keeping a record of your blood glucose levels.  May monitor ketone levels in the urine, although this is no longer considered necessary in most pregnancies. HOME CARE INSTRUCTIONS  While you are pregnant:  Follow your caregiver's advice regarding your prenatal appointments, meal planning, exercise, medicines, vitamins, blood and other tests, and physical   activities.  Keep a record of your meals, blood glucose tests, and the amount of insulin you are taking (if any). Show this to your caregiver at every prenatal visit.  If you have GDM, you may have problems with hypoglycemia (low blood glucose).  You may suspect this if you become suddenly dizzy, feel shaky, and/or weak. If you think this is happening and you have a glucose meter, try to test your blood glucose level. Follow your caregiver's advice for when and how to treat your low blood glucose. Generally, the 15:15 rule is followed: Treat by consuming 15 grams of carbohydrates, wait 15 minutes, and recheck blood glucose. Examples of 15 grams of carbohydrates are:  1 cup skim or low-fat milk.   cup juice.  3-4 glucose tablets.  5-6 hard candies.  1 small box raisins.   cup regular soda pop.  Practice good hygiene, to avoid infections.  Do not smoke. SEEK MEDICAL CARE IF:   You develop abnormal vaginal discharge, with or without itching.  You become weak and tired more than expected.  You seem to sweat a lot.  You have a sudden increase in weight, 5 pounds or more in one week.  You are losing weight, 3 pounds or more in a week.  Your blood glucose level is high, and you need instructions on what to do about it. SEEK IMMEDIATE MEDICAL CARE IF:   You develop a severe headache.  You faint or pass out.  You develop nausea and vomiting.  You become disoriented or confused.  You have a convulsion.  You develop vision problems.  You develop stomach pain.  You develop vaginal bleeding.  You develop uterine contractions.  You have leaking or a gush of fluid from the vagina. AFTER YOU HAVE THE BABY:  Go to all of your follow-up appointments, and have blood tests as advised by your caregiver.  Maintain a healthy lifestyle, to prevent diabetes in the future. This includes:  Following a healthy meal plan.  Controlling your weight.  Getting enough exercise and proper rest.  Do not smoke.  Breastfeed your baby if you can. This will lower the chance of you and your baby developing diabetes later in life. For more information about diabetes, go to the American Diabetes Association at:  www.americandiabetesassociation.org. For more information about gestational diabetes, go to the American Congress of Obstetricians and Gynecologists at: www.acog.org. Document Released: 10/01/2000 Document Revised: 09/17/2011 Document Reviewed: 04/25/2009 ExitCare Patient Information 2013 [employer].  Breastfeeding Deciding to breastfeed is one of the best choices you can make for you and your baby. The information that follows gives a brief overview of the benefits of breastfeeding as well as common topics surrounding breastfeeding. BENEFITS OF BREASTFEEDING For the baby  The first milk (colostrum) helps the baby's digestive system function better.   There are antibodies in the mother's milk that help the baby fight off infections.   The baby has a lower incidence of asthma, allergies, and sudden infant death syndrome (SIDS).   The nutrients in breast milk are better for the baby than infant formulas, and breast milk helps the baby's brain grow better.   Babies who breastfeed have less gas, colic, and constipation.  For the mother  Breastfeeding helps develop a very special bond between the mother and her baby.   Breastfeeding is convenient, always available at the correct temperature, and costs nothing.   Breastfeeding burns calories in the mother and helps her lose weight that was gained during pregnancy.     Breastfeeding makes the uterus contract back down to normal size faster and slows bleeding following delivery.   Breastfeeding mothers have a lower risk of developing breast cancer.  BREASTFEEDING FREQUENCY  A healthy, full-term baby may breastfeed as often as every hour or space his or her feedings to every 3 hours.   Watch your baby for signs of hunger. Nurse your baby if he or she shows signs of hunger. How often you nurse will vary from baby to baby.   Nurse as often as the baby requests, or when you feel the need to reduce the fullness of your breasts.    Awaken the baby if it has been 3 4 hours since the last feeding.   Frequent feeding will help the mother make more milk and will help prevent problems, such as sore nipples and engorgement of the breasts.  BABY'S POSITION AT THE BREAST  Whether lying down or sitting, be sure that the baby's tummy is facing your tummy.   Support the breast with 4 fingers underneath the breast and the thumb above. Make sure your fingers are well away from the nipple and baby's mouth.   Stroke the baby's lips gently with your finger or nipple.   When the baby's mouth is open wide enough, place all of your nipple and as much of the areola as possible into your baby's mouth.   Pull the baby in close so the tip of the nose and the baby's cheeks touch the breast during the feeding.  FEEDINGS AND SUCTION  The length of each feeding varies from baby to baby and from feeding to feeding.   The baby must suck about 2 3 minutes for your milk to get to him or her. This is called a "let down." For this reason, allow the baby to feed on each breast as long as he or she wants. Your baby will end the feeding when he or she has received the right balance of nutrients.   To break the suction, put your finger into the corner of the baby's mouth and slide it between his or her gums before removing your breast from his or her mouth. This will help prevent sore nipples.  HOW TO TELL WHETHER YOUR BABY IS GETTING ENOUGH BREAST MILK. Wondering whether or not your baby is getting enough milk is a common concern among mothers. You can be assured that your baby is getting enough milk if:   Your baby is actively sucking and you hear swallowing.   Your baby seems relaxed and satisfied after a feeding.   Your baby nurses at least 8 12 times in a 24 hour time period. Nurse your baby until he or she unlatches or falls asleep at the first breast (at least 10 20 minutes), then offer the second side.   Your baby is wetting  5 6 disposable diapers (6 8 cloth diapers) in a 24 hour period by 5 6 days of age.   Your baby is having at least 3 4 stools every 24 hours for the first 6 weeks. The stool should be soft and yellow.   Your baby should gain 4 7 ounces per week after he or she is 4 days old.   Your breasts feel softer after nursing.  REDUCING BREAST ENGORGEMENT  In the first week after your baby is born, you may experience signs of breast engorgement. When breasts are engorged, they feel heavy, warm, full, and may be tender to the touch. You can reduce   engorgement if you:   Nurse frequently, every 2 3 hours. Mothers who breastfeed early and often have fewer problems with engorgement.   Place light ice packs on your breasts for 10 20 minutes between feedings. This reduces swelling. Wrap the ice packs in a lightweight towel to protect your skin. Bags of frozen vegetables work well for this purpose.   Take a warm shower or apply warm, moist heat to your breast for 5 10 minutes just before each feeding. This increases circulation and helps the milk flow.   Gently massage your breast before and during the feeding. Using your finger tips, massage from the chest wall towards your nipple in a circular motion.   Make sure that the baby empties at least one breast at every feeding before switching sides.   Use a breast pump to empty the breasts if your baby is sleepy or not nursing well. You may also want to pump if you are returning to work oryou feel you are getting engorged.   Avoid bottle feeds, pacifiers, or supplemental feedings of water or juice in place of breastfeeding. Breast milk is all the food your baby needs. It is not necessary for your baby to have water or formula. In fact, to help your breasts make more milk, it is best not to give your baby supplemental feedings during the early weeks.   Be sure the baby is latched on and positioned properly while breastfeeding.   Wear a supportive  bra, avoiding underwire styles.   Eat a balanced diet with enough fluids.   Rest often, relax, and take your prenatal vitamins to prevent fatigue, stress, and anemia.  If you follow these suggestions, your engorgement should improve in 24 48 hours. If you are still experiencing difficulty, call your lactation consultant or caregiver.  CARING FOR YOURSELF Take care of your breasts  Bathe or shower daily.   Avoid using soap on your nipples.   Start feedings on your left breast at one feeding and on your right breast at the next feeding.   You will notice an increase in your milk supply 2 5 days after delivery. You may feel some discomfort from engorgement, which makes your breasts very firm and often tender. Engorgement "peaks" out within 24 48 hours. In the meantime, apply warm moist towels to your breasts for 5 10 minutes before feeding. Gentle massage and expression of some milk before feeding will soften your breasts, making it easier for your baby to latch on.   Wear a well-fitting nursing bra, and air dry your nipples for a 3 4minutes after each feeding.   Only use cotton bra pads.   Only use pure lanolin on your nipples after nursing. You do not need to wash it off before feeding the baby again. Another option is to express a few drops of breast milk and gently massage it into your nipples.  Take care of yourself  Eat well-balanced meals and nutritious snacks.   Drinking milk, fruit juice, and water to satisfy your thirst (about 8 glasses a day).   Get plenty of rest.  Avoid foods that you notice affect the baby in a bad way.  SEEK MEDICAL CARE IF:   You have difficulty with breastfeeding and need help.   You have a hard, red, sore area on your breast that is accompanied by a fever.   Your baby is too sleepy to eat well or is having trouble sleeping.   Your baby is wetting less than   6 diapers a day, by 5 days of age.   Your baby's skin or white part of  his or her eyes is more yellow than it was in the hospital.   You feel depressed.  Document Released: 06/25/2005 Document Revised: 12/25/2011 Document Reviewed: 09/23/2011 Exit[employer] Patient Information 2013 [employer].  

## 2012-06-02 NOTE — Progress Notes (Signed)
FBS 76-150 2 hr 56-187, most in range--she is working on adjusting diet.

## 2012-06-15 ENCOUNTER — Encounter (HOSPITAL_BASED_OUTPATIENT_CLINIC_OR_DEPARTMENT_OTHER): Payer: Self-pay | Admitting: *Deleted

## 2012-06-15 ENCOUNTER — Emergency Department (HOSPITAL_BASED_OUTPATIENT_CLINIC_OR_DEPARTMENT_OTHER)
Admission: EM | Admit: 2012-06-15 | Discharge: 2012-06-15 | Disposition: A | Discharge status: Home / Self Care | Payer: Medicaid Other | Attending: Emergency Medicine | Admitting: Emergency Medicine

## 2012-06-15 DIAGNOSIS — Z79899 Other long term (current) drug therapy: Secondary | ICD-10-CM | POA: Insufficient documentation

## 2012-06-15 DIAGNOSIS — Z8742 Personal history of other diseases of the female genital tract: Secondary | ICD-10-CM | POA: Insufficient documentation

## 2012-06-15 DIAGNOSIS — Z8619 Personal history of other infectious and parasitic diseases: Secondary | ICD-10-CM | POA: Insufficient documentation

## 2012-06-15 DIAGNOSIS — E109 Type 1 diabetes mellitus without complications: Secondary | ICD-10-CM | POA: Insufficient documentation

## 2012-06-15 DIAGNOSIS — Z8659 Personal history of other mental and behavioral disorders: Secondary | ICD-10-CM | POA: Insufficient documentation

## 2012-06-15 DIAGNOSIS — R87629 Unspecified abnormal cytological findings in specimens from vagina: Secondary | ICD-10-CM | POA: Insufficient documentation

## 2012-06-15 DIAGNOSIS — Z794 Long term (current) use of insulin: Secondary | ICD-10-CM | POA: Insufficient documentation

## 2012-06-15 DIAGNOSIS — Z862 Personal history of diseases of the blood and blood-forming organs and certain disorders involving the immune mechanism: Secondary | ICD-10-CM | POA: Insufficient documentation

## 2012-06-15 DIAGNOSIS — R61 Generalized hyperhidrosis: Secondary | ICD-10-CM | POA: Insufficient documentation

## 2012-06-15 DIAGNOSIS — O219 Vomiting of pregnancy, unspecified: Secondary | ICD-10-CM | POA: Insufficient documentation

## 2012-06-15 DIAGNOSIS — Z87440 Personal history of urinary (tract) infections: Secondary | ICD-10-CM | POA: Insufficient documentation

## 2012-06-15 DIAGNOSIS — R110 Nausea: Secondary | ICD-10-CM

## 2012-06-15 DIAGNOSIS — Z8679 Personal history of other diseases of the circulatory system: Secondary | ICD-10-CM | POA: Insufficient documentation

## 2012-06-15 DIAGNOSIS — J45909 Unspecified asthma, uncomplicated: Secondary | ICD-10-CM | POA: Insufficient documentation

## 2012-06-15 LAB — COMPREHENSIVE METABOLIC PANEL
Albumin: 3.1 g/dL — ABNORMAL LOW (ref 3.5–5.2)
BUN: 10 mg/dL (ref 6–23)
Creatinine, Ser: 0.7 mg/dL (ref 0.50–1.10)
Potassium: 3.4 mEq/L — ABNORMAL LOW (ref 3.5–5.1)
Total Protein: 7.7 g/dL (ref 6.0–8.3)

## 2012-06-15 LAB — URINE MICROSCOPIC-ADD ON

## 2012-06-15 LAB — CBC WITH DIFFERENTIAL/PLATELET
Basophils Relative: 0 % (ref 0–1)
Eosinophils Absolute: 0.1 10*3/uL (ref 0.0–0.7)
Hemoglobin: 11.3 g/dL — ABNORMAL LOW (ref 12.0–15.0)
MCH: 24.9 pg — ABNORMAL LOW (ref 26.0–34.0)
MCHC: 33.7 g/dL (ref 30.0–36.0)
Monocytes Absolute: 0.8 10*3/uL (ref 0.1–1.0)
Monocytes Relative: 13 % — ABNORMAL HIGH (ref 3–12)
Neutrophils Relative %: 48 % (ref 43–77)

## 2012-06-15 LAB — URINALYSIS, ROUTINE W REFLEX MICROSCOPIC
Glucose, UA: NEGATIVE mg/dL
Specific Gravity, Urine: 1.025 (ref 1.005–1.030)
pH: 6 (ref 5.0–8.0)

## 2012-06-15 LAB — GLUCOSE, CAPILLARY: Glucose-Capillary: 84 mg/dL (ref 70–99)

## 2012-06-15 MED ORDER — ONDANSETRON HCL 4 MG/2ML IJ SOLN
4.0000 mg | Freq: Once | INTRAMUSCULAR | Status: AC
  Administered 2012-06-15: 4 mg via INTRAVENOUS

## 2012-06-15 MED ORDER — ONDANSETRON 4 MG PO TBDP: 4.0000 mg | ORAL_TABLET | Freq: Three times a day (TID) | ORAL | Status: DC | PRN

## 2012-06-15 MED ORDER — ONDANSETRON HCL 4 MG/2ML IJ SOLN
INTRAMUSCULAR | Status: AC
  Administered 2012-06-15: 4 mg via INTRAVENOUS
  Filled 2012-06-15: qty 2

## 2012-06-15 NOTE — ED Notes (Signed)
Patient ambulates to restroom without difficulty or assistance.  

## 2012-06-15 NOTE — ED Notes (Signed)
Pt states she is here bringing her 24 yo son in for treatment and she began feeling flushed and like she was going to faint or her blood sugar was dropping is type I diabetic

## 2012-06-15 NOTE — ED Notes (Signed)
The patient's CBG was 104.

## 2012-06-15 NOTE — ED Provider Notes (Signed)
History  This chart was scribed for Loren Racer, MD by [name] Scrape, ED Scribe. This patient was seen in room MHT14/[facility]14 and the patient's care was started at 4:02 PM.  CSN: [id number]  Arrival date & time 06/15/12  1539   First MD Initiated Contact with Patient 06/15/12 1602      Chief Complaint  Patient presents with  . Hypoglycemia     The history is provided by the patient. No language interpreter was used.    Christie [name] is a 26 y.o. female with a h/o DM and who is 4.5 months pregnant who presents to the Emergency Department complaining of sudden onset, now resolved lightheadedness with associated diaphoresis, nausea and 2 episodes of emesis while in the waiting room of the ED. She reports that she believes her blood sugar levels dropped. CBG in the ED was 104; however, pt is unsure what her blood sugar level was prior to arrival. She states that she feels back to baseline currently. She reports that she was switched from a sliding scale insulin to Novolin N and Humulin R 4 months ago by her PCP due to the pregnancy. She denies any recent missed doses of her insulin and reports that she has eaten a normal amount of food today. She is following up with her OBGYN and had her first normal ultrasound which confirmed an intrauterine pregnancy. She states that she has another scheduled in 2 days. She reports abdominal cramping that she was told is normal. She denies any recent changes. She reports nausea during pregnancy but states that this has since resolved. She is G3P1 and reports pregnancy induced HTN and anemia with prior pregnancies. She denies any recent fevers, chills, cough or rhinorrhea as associated symptoms. Her child is also being seen for a cough in this ED, but she denies having any similar symptoms. She also has asthma, depression and anxiety.    Past Medical History  Diagnosis Date  . Asthma   . Hx of migraines   . Anemia   . Depression   . Fx ankle      history of left ankle fx at age 7  . Pregnancy induced hypertension   . Diabetes mellitus     type 1 on insulin  . Headache   . Urinary tract infection   . Chlamydia   . Abnormal Pap smear     colpo scheduled  . Anxiety     Past Surgical History  Procedure Date  . Wisdom tooth extraction   . Colposcopy     Family History  Problem Relation Age of Onset  . Asthma Mother   . Diabetes Mother   . Hypertension Mother   . Asthma Father   . Anesthesia problems Neg Hx   . Hypertension Sister     History  Substance Use Topics  . Smoking status: Never Smoker   . Smokeless tobacco: Never Used  . Alcohol Use: No     Comment: occasional (once a month)- not (+) UPT    OB History    Grav Para Term Preterm Abortions TAB SAB Ect Mult Living   3 1 1  0 1 1 0 0 0 1      Review of Systems  Constitutional: Positive for diaphoresis. Negative for fever and chills.  HENT: Negative for rhinorrhea.   Respiratory: Negative for cough and shortness of breath.   Cardiovascular: Negative for chest pain.  Gastrointestinal: Positive for nausea and vomiting. Negative for abdominal pain.  Neurological: Positive  for light-headedness. Negative for headaches.  All other systems reviewed and are negative.    Allergies  Banana; Citrus; and Adhesive  Home Medications   Current Outpatient Rx  Name  Route  Sig  Dispense  Refill  . ALBUTEROL SULFATE HFA 108 (90 BASE) MCG/ACT IN AERS   Inhalation   Inhale 2 puffs into the lungs every 6 (six) hours as needed. For shortness of breath or wheezing   1 Inhaler   5   . INSULIN ISOPHANE HUMAN 100 UNIT/ML [place] SUSP   Subcutaneous   Inject 13-27 Units into the skin 2 (two) times daily. 27 u sq q am, 13 u sq q hs   10 mL   3     With breakfast and at bedtime   . INSULIN REGULAR HUMAN 100 UNIT/ML IJ SOLN   Subcutaneous   Inject 0.1 mLs (10 Units total) into the skin 2 (two) times daily before a meal. 10 u sq q am, and 10 u sq q ac dinner (evening  meal)   10 mL   12   . ONDANSETRON 4 MG PO TBDP   Oral   Take 1 tablet (4 mg total) by mouth every 8 (eight) hours as needed for nausea.   20 tablet   0   . OXYCODONE-ACETAMINOPHEN 5-325 MG PO TABS   Oral   Take 1 tablet by mouth every 4 (four) hours as needed for pain.   10 tablet   0   . PRENATAL MULTIVITAMIN CH   Oral   Take 1 tablet by mouth every morning.           Triage Vitals: BP 128/83  Pulse 81  Temp 98.2 F (36.8 C) (Oral)  Resp 20  SpO2 99%  LMP 02/11/2012  Physical Exam  Nursing note and vitals reviewed. Constitutional: She is oriented to person, place, and time. She appears well-developed and well-nourished. No distress.  HENT:  Head: Normocephalic and atraumatic.  Mouth/Throat: Oropharynx is clear and moist.  Eyes: Conjunctivae normal and EOM are normal. Pupils are equal, round, and reactive to light.  Neck: Normal range of motion. Neck supple. No tracheal deviation present.  Cardiovascular: Normal rate and regular rhythm.   No murmur heard. Pulmonary/Chest: Effort normal and breath sounds normal. No respiratory distress.  Abdominal: Soft. She exhibits no distension. There is no tenderness.  Musculoskeletal: Normal range of motion. She exhibits no edema.  Neurological: She is alert and oriented to person, place, and time. No cranial nerve deficit.  Skin: Skin is warm and dry.  Psychiatric: She has a normal mood and affect. Her behavior is normal.    ED Course  Procedures (including critical care time)   Date: 06/15/2012  Rate: 66  Rhythm: normal sinus rhythm  QRS Axis: normal  Intervals: normal  ST/T Wave abnormalities: early repolarization  Conduction Disutrbances:none  Narrative Interpretation: NSR with early repolarization  Old EKG Reviewed: unchanged  DIAGNOSTIC STUDIES: Oxygen Saturation is 99% on room air, normal by my interpretation.    COORDINATION OF CARE: 4:00 PM-Pt was given 4 mg injection of Zofran that I approved in the ED  waiting room.  4:24 PM- Discussed treatment plan which includes blood work and UA with pt at bedside and pt agreed to plan.  5:45 PM- Pt rechecked and feels at baseline. She expresses that she wants to be discharged. Advised pt to follow up with OBGYN as planned and she agreed.  Labs Reviewed  CBC WITH DIFFERENTIAL - Abnormal; Notable  for the following:    Hemoglobin 11.3 (*)     HCT 33.5 (*)     MCV 74.0 (*)     MCH 24.9 (*)     RDW 16.1 (*)     Monocytes Relative 13 (*)     All other components within normal limits  COMPREHENSIVE METABOLIC PANEL - Abnormal; Notable for the following:    Potassium 3.4 (*)     Glucose, Bld 113 (*)     Albumin 3.1 (*)     Total Bilirubin 0.2 (*)     All other components within normal limits  URINALYSIS, ROUTINE W REFLEX MICROSCOPIC - Abnormal; Notable for the following:    Leukocytes, UA SMALL (*)     All other components within normal limits  URINE MICROSCOPIC-ADD ON - Abnormal; Notable for the following:    Squamous Epithelial / LPF FEW (*)     All other components within normal limits  GLUCOSE, CAPILLARY   No results found.   1. Nausea       MDM  I personally performed the services described in this documentation, which was scribed in my presence. The recorded information has been reviewed and is accurate.    Loren Racer, MD 06/15/12 [phone]

## 2012-06-15 NOTE — ED Notes (Signed)
Patient given peanut butter and crackers per RN request

## 2012-06-16 ENCOUNTER — Ambulatory Visit (INDEPENDENT_AMBULATORY_CARE_PROVIDER_SITE_OTHER): Payer: Self-pay | Admitting: Family Medicine

## 2012-06-16 ENCOUNTER — Encounter: Payer: Self-pay | Admitting: Family Medicine

## 2012-06-16 VITALS — BP 111/72 | Temp 98.0°F | Wt 195.2 lb

## 2012-06-16 DIAGNOSIS — O09299 Supervision of pregnancy with other poor reproductive or obstetric history, unspecified trimester: Secondary | ICD-10-CM

## 2012-06-16 LAB — POCT URINALYSIS DIP (DEVICE)
Hgb urine dipstick: NEGATIVE
Nitrite: NEGATIVE
Urobilinogen, UA: 0.2 mg/dL (ref 0.0–1.0)
pH: 7 (ref 5.0–8.0)

## 2012-06-16 NOTE — Patient Instructions (Signed)
Pregnancy - Second Trimester The second trimester of pregnancy (3 to 6 months) is a period of rapid growth for you and your baby. At the end of the sixth month, your baby is about 9 inches long and weighs 1 1/2 pounds. You will begin to feel the baby move between 18 and 20 weeks of the pregnancy. This is called quickening. Weight gain is faster. A clear fluid (colostrum) may leak out of your breasts. You may feel small contractions of the womb (uterus). This is known as false labor or Braxton-Hicks contractions. This is like a practice for labor when the baby is ready to be born. Usually, the problems with morning sickness have usually passed by the end of your first trimester. Some women develop small dark blotches (called cholasma, mask of pregnancy) on their face that usually goes away after the baby is born. Exposure to the sun makes the blotches worse. Acne may also develop in some pregnant women and pregnant women who have acne, may find that it goes away. PRENATAL EXAMS  Blood work may continue to be done during prenatal exams. These tests are done to check on your health and the probable health of your baby. Blood work is used to follow your blood levels (hemoglobin). Anemia (low hemoglobin) is common during pregnancy. Iron and vitamins are given to help prevent this. You will also be checked for diabetes between 24 and 28 weeks of the pregnancy. Some of the previous blood tests may be repeated.  The size of the uterus is measured during each visit. This is to make sure that the baby is continuing to grow properly according to the dates of the pregnancy.  Your blood pressure is checked every prenatal visit. This is to make sure you are not getting toxemia.  Your urine is checked to make sure you do not have an infection, diabetes or protein in the urine.  Your weight is checked often to make sure gains are happening at the suggested rate. This is to ensure that both you and your baby are  growing normally.  Sometimes, an ultrasound is performed to confirm the proper growth and development of the baby. This is a test which bounces harmless sound waves off the baby so your caregiver can more accurately determine due dates. Sometimes, a specialized test is done on the amniotic fluid surrounding the baby. This test is called an amniocentesis. The amniotic fluid is obtained by sticking a needle into the belly (abdomen). This is done to check the chromosomes in instances where there is a concern about possible genetic problems with the baby. It is also sometimes done near the end of pregnancy if an early delivery is required. In this case, it is done to help make sure the baby's lungs are mature enough for the baby to live outside of the womb. CHANGES OCCURING IN THE SECOND TRIMESTER OF PREGNANCY Your body goes through many changes during pregnancy. They vary from person to person. Talk to your caregiver about changes you notice that you are concerned about.  During the second trimester, you will likely have an increase in your appetite. It is normal to have cravings for certain foods. This varies from person to person and pregnancy to pregnancy.  Your lower abdomen will begin to bulge.  You may have to urinate more often because the uterus and baby are pressing on your bladder. It is also common to get more bladder infections during pregnancy (pain with urination). You can help this by   drinking lots of fluids and emptying your bladder before and after intercourse.  You may begin to get stretch marks on your hips, abdomen, and breasts. These are normal changes in the body during pregnancy. There are no exercises or medications to take that prevent this change.  You may begin to develop swollen and bulging veins (varicose veins) in your legs. Wearing support hose, elevating your feet for 15 minutes, 3 to 4 times a day and limiting salt in your diet helps lessen the problem.  Heartburn may  develop as the uterus grows and pushes up against the stomach. Antacids recommended by your caregiver helps with this problem. Also, eating smaller meals 4 to 5 times a day helps.  Constipation can be treated with a stool softener or adding bulk to your diet. Drinking lots of fluids, vegetables, fruits, and whole grains are helpful.  Exercising is also helpful. If you have been very active up until your pregnancy, most of these activities can be continued during your pregnancy. If you have been less active, it is helpful to start an exercise program such as walking.  Hemorrhoids (varicose veins in the rectum) may develop at the end of the second trimester. Warm sitz baths and hemorrhoid cream recommended by your caregiver helps hemorrhoid problems.  Backaches may develop during this time of your pregnancy. Avoid heavy lifting, wear low heal shoes and practice good posture to help with backache problems.  Some pregnant women develop tingling and numbness of their hand and fingers because of swelling and tightening of ligaments in the wrist (carpel tunnel syndrome). This goes away after the baby is born.  As your breasts enlarge, you may have to get a bigger bra. Get a comfortable, cotton, support bra. Do not get a nursing bra until the last month of the pregnancy if you will be nursing the baby.  You may get a dark line from your belly button to the pubic area called the linea nigra.  You may develop rosy cheeks because of increase blood flow to the face.  You may develop spider looking lines of the face, neck, arms and chest. These go away after the baby is born. HOME CARE INSTRUCTIONS   It is extremely important to avoid all smoking, herbs, alcohol, and unprescribed drugs during your pregnancy. These chemicals affect the formation and growth of the baby. Avoid these chemicals throughout the pregnancy to ensure the delivery of a healthy infant.  Most of your home care instructions are the same  as suggested for the first trimester of your pregnancy. Keep your caregiver's appointments. Follow your caregiver's instructions regarding medication use, exercise and diet.  During pregnancy, you are providing food for you and your baby. Continue to eat regular, well-balanced meals. Choose foods such as meat, fish, milk and other low fat dairy products, vegetables, fruits, and whole-grain breads and cereals. Your caregiver will tell you of the ideal weight gain.  A physical sexual relationship may be continued up until near the end of pregnancy if there are no other problems. Problems could include early (premature) leaking of amniotic fluid from the membranes, vaginal bleeding, abdominal pain, or other medical or pregnancy problems.  Exercise regularly if there are no restrictions. Check with your caregiver if you are unsure of the safety of some of your exercises. The greatest weight gain will occur in the last 2 trimesters of pregnancy. Exercise will help you:  Control your weight.  Get you in shape for labor and delivery.  Lose weight   after you have the baby.  Wear a good support or jogging bra for breast tenderness during pregnancy. This may help if worn during sleep. Pads or tissues may be used in the bra if you are leaking colostrum.  Do not use hot tubs, steam rooms or saunas throughout the pregnancy.  Wear your seat belt at all times when driving. This protects you and your baby if you are in an accident.  Avoid raw meat, uncooked cheese, cat litter boxes and soil used by cats. These carry germs that can cause birth defects in the baby.  The second trimester is also a good time to visit your dentist for your dental health if this has not been done yet. Getting your teeth cleaned is OK. Use a soft toothbrush. Brush gently during pregnancy.  It is easier to loose urine during pregnancy. Tightening up and strengthening the pelvic muscles will help with this problem. Practice stopping  your urination while you are going to the bathroom. These are the same muscles you need to strengthen. It is also the muscles you would use as if you were trying to stop from passing gas. You can practice tightening these muscles up 10 times a set and repeating this about 3 times per day. Once you know what muscles to tighten up, do not perform these exercises during urination. It is more likely to contribute to an infection by backing up the urine.  Ask for help if you have financial, counseling or nutritional needs during pregnancy. Your caregiver will be able to offer counseling for these needs as well as refer you for other special needs.  Your skin may become oily. If so, wash your face with mild soap, use non-greasy moisturizer and oil or cream based makeup. MEDICATIONS AND DRUG USE IN PREGNANCY  Take prenatal vitamins as directed. The vitamin should contain 1 milligram of folic acid. Keep all vitamins out of reach of children. Only a couple vitamins or tablets containing iron may be fatal to a baby or young child when ingested.  Avoid use of all medications, including herbs, over-the-counter medications, not prescribed or suggested by your caregiver. Only take over-the-counter or prescription medicines for pain, discomfort, or fever as directed by your caregiver. Do not use aspirin.  Let your caregiver also know about herbs you may be using.  Alcohol is related to a number of birth defects. This includes fetal alcohol syndrome. All alcohol, in any form, should be avoided completely. Smoking will cause low birth rate and premature babies.  Street or illegal drugs are very harmful to the baby. They are absolutely forbidden. A baby born to an addicted mother will be addicted at birth. The baby will go through the same withdrawal an adult does. SEEK MEDICAL CARE IF:  You have any concerns or worries during your pregnancy. It is better to call with your questions if you feel they cannot wait,  rather than worry about them. SEEK IMMEDIATE MEDICAL CARE IF:   An unexplained oral temperature above 102 F (38.9 C) develops, or as your caregiver suggests.  You have leaking of fluid from the vagina (birth canal). If leaking membranes are suspected, take your temperature and tell your caregiver of this when you call.  There is vaginal spotting, bleeding, or passing clots. Tell your caregiver of the amount and how many pads are used. Light spotting in pregnancy is common, especially following intercourse.  You develop a bad smelling vaginal discharge with a change in the color from clear   to white.  You continue to feel sick to your stomach (nauseated) and have no relief from remedies suggested. You vomit blood or coffee ground-like materials.  You lose more than 2 pounds of weight or gain more than 2 pounds of weight over 1 week, or as suggested by your caregiver.  You notice swelling of your face, hands, feet, or legs.  You get exposed to German measles and have never had them.  You are exposed to fifth disease or chickenpox.  You develop belly (abdominal) pain. Round ligament discomfort is a common non-cancerous (benign) cause of abdominal pain in pregnancy. Your caregiver still must evaluate you.  You develop a bad headache that does not go away.  You develop fever, diarrhea, pain with urination, or shortness of breath.  You develop visual problems, blurry, or double vision.  You fall or are in a car accident or any kind of trauma.  There is mental or physical violence at home. Document Released: 06/19/2001 Document Revised: 09/17/2011 Document Reviewed: 12/22/2008 [employer] Patient Information 2013 [employer].  Breastfeeding Deciding to breastfeed is one of the best choices you can make for you and your baby. The information that follows gives a brief overview of the benefits of breastfeeding as well as common topics surrounding breastfeeding. BENEFITS OF  BREASTFEEDING For the baby  The first milk (colostrum) helps the baby's digestive system function better.   There are antibodies in the mother's milk that help the baby fight off infections.   The baby has a lower incidence of asthma, allergies, and sudden infant death syndrome (SIDS).   The nutrients in breast milk are better for the baby than infant formulas, and breast milk helps the baby's brain grow better.   Babies who breastfeed have less gas, colic, and constipation.  For the mother  Breastfeeding helps develop a very special bond between the mother and her baby.   Breastfeeding is convenient, always available at the correct temperature, and costs nothing.   Breastfeeding burns calories in the mother and helps her lose weight that was gained during pregnancy.   Breastfeeding makes the uterus contract back down to normal size faster and slows bleeding following delivery.   Breastfeeding mothers have a lower risk of developing breast cancer.  BREASTFEEDING FREQUENCY  A healthy, full-term baby may breastfeed as often as every hour or space his or her feedings to every 3 hours.   Watch your baby for signs of hunger. Nurse your baby if he or she shows signs of hunger. How often you nurse will vary from baby to baby.   Nurse as often as the baby requests, or when you feel the need to reduce the fullness of your breasts.   Awaken the baby if it has been 3 4 hours since the last feeding.   Frequent feeding will help the mother make more milk and will help prevent problems, such as sore nipples and engorgement of the breasts.  BABY'S POSITION AT THE BREAST  Whether lying down or sitting, be sure that the baby's tummy is facing your tummy.   Support the breast with 4 fingers underneath the breast and the thumb above. Make sure your fingers are well away from the nipple and baby's mouth.   Stroke the baby's lips gently with your finger or nipple.   When the  baby's mouth is open wide enough, place all of your nipple and as much of the areola as possible into your baby's mouth.   Pull the baby in   close so the tip of the nose and the baby's cheeks touch the breast during the feeding.  FEEDINGS AND SUCTION  The length of each feeding varies from baby to baby and from feeding to feeding.   The baby must suck about 2 3 minutes for your milk to get to him or her. This is called a "let down." For this reason, allow the baby to feed on each breast as long as he or she wants. Your baby will end the feeding when he or she has received the right balance of nutrients.   To break the suction, put your finger into the corner of the baby's mouth and slide it between his or her gums before removing your breast from his or her mouth. This will help prevent sore nipples.  HOW TO TELL WHETHER YOUR BABY IS GETTING ENOUGH BREAST MILK. Wondering whether or not your baby is getting enough milk is a common concern among mothers. You can be assured that your baby is getting enough milk if:   Your baby is actively sucking and you hear swallowing.   Your baby seems relaxed and satisfied after a feeding.   Your baby nurses at least 8 12 times in a 24 hour time period. Nurse your baby until he or she unlatches or falls asleep at the first breast (at least 10 20 minutes), then offer the second side.   Your baby is wetting 5 6 disposable diapers (6 8 cloth diapers) in a 24 hour period by 5 6 days of age.   Your baby is having at least 3 4 stools every 24 hours for the first 6 weeks. The stool should be soft and yellow.   Your baby should gain 4 7 ounces per week after he or she is 4 days old.   Your breasts feel softer after nursing.  REDUCING BREAST ENGORGEMENT  In the first week after your baby is born, you may experience signs of breast engorgement. When breasts are engorged, they feel heavy, warm, full, and may be tender to the touch. You can reduce  engorgement if you:   Nurse frequently, every 2 3 hours. Mothers who breastfeed early and often have fewer problems with engorgement.   Place light ice packs on your breasts for 10 20 minutes between feedings. This reduces swelling. Wrap the ice packs in a lightweight towel to protect your skin. Bags of frozen vegetables work well for this purpose.   Take a warm shower or apply warm, moist heat to your breast for 5 10 minutes just before each feeding. This increases circulation and helps the milk flow.   Gently massage your breast before and during the feeding. Using your finger tips, massage from the chest wall towards your nipple in a circular motion.   Make sure that the baby empties at least one breast at every feeding before switching sides.   Use a breast pump to empty the breasts if your baby is sleepy or not nursing well. You may also want to pump if you are returning to work oryou feel you are getting engorged.   Avoid bottle feeds, pacifiers, or supplemental feedings of water or juice in place of breastfeeding. Breast milk is all the food your baby needs. It is not necessary for your baby to have water or formula. In fact, to help your breasts make more milk, it is best not to give your baby supplemental feedings during the early weeks.   Be sure the baby is latched   on and positioned properly while breastfeeding.   Wear a supportive bra, avoiding underwire styles.   Eat a balanced diet with enough fluids.   Rest often, relax, and take your prenatal vitamins to prevent fatigue, stress, and anemia.  If you follow these suggestions, your engorgement should improve in 24 48 hours. If you are still experiencing difficulty, call your lactation consultant or caregiver.  CARING FOR YOURSELF Take care of your breasts  Bathe or shower daily.   Avoid using soap on your nipples.   Start feedings on your left breast at one feeding and on your right breast at the next  feeding.   You will notice an increase in your milk supply 2 5 days after delivery. You may feel some discomfort from engorgement, which makes your breasts very firm and often tender. Engorgement "peaks" out within 24 48 hours. In the meantime, apply warm moist towels to your breasts for 5 10 minutes before feeding. Gentle massage and expression of some milk before feeding will soften your breasts, making it easier for your baby to latch on.   Wear a well-fitting nursing bra, and air dry your nipples for a 3 4minutes after each feeding.   Only use cotton bra pads.   Only use pure lanolin on your nipples after nursing. You do not need to wash it off before feeding the baby again. Another option is to express a few drops of breast milk and gently massage it into your nipples.  Take care of yourself  Eat well-balanced meals and nutritious snacks.   Drinking milk, fruit juice, and water to satisfy your thirst (about 8 glasses a day).   Get plenty of rest.  Avoid foods that you notice affect the baby in a bad way.  SEEK MEDICAL CARE IF:   You have difficulty with breastfeeding and need help.   You have a hard, red, sore area on your breast that is accompanied by a fever.   Your baby is too sleepy to eat well or is having trouble sleeping.   Your baby is wetting less than 6 diapers a day, by 5 days of age.   Your baby's skin or white part of his or her eyes is more yellow than it was in the hospital.   You feel depressed.  Document Released: 06/25/2005 Document Revised: 12/25/2011 Document Reviewed: 09/23/2011 [employer] Patient Information 2013 [employer].  

## 2012-06-16 NOTE — Progress Notes (Signed)
FBS 71-103 2 of 10 out of range 2 hr pp 44-121 BS much improved with diet!!!  Congratulated on efforts.

## 2012-06-16 NOTE — Progress Notes (Signed)
Pulse- 74  Edema- leg and feet swell when standing   Pain-"cramping"

## 2012-06-17 ENCOUNTER — Ambulatory Visit (HOSPITAL_COMMUNITY)
Admission: RE | Admit: 2012-06-17 | Discharge: 2012-06-17 | Disposition: A | Discharge status: Home / Self Care | Payer: Medicaid Other | Source: Ambulatory Visit | Attending: Family Medicine | Admitting: Family Medicine

## 2012-06-17 VITALS — BP 108/71 | HR 82 | Wt 194.0 lb

## 2012-06-17 DIAGNOSIS — Z283 Underimmunization status: Secondary | ICD-10-CM

## 2012-06-17 DIAGNOSIS — O09299 Supervision of pregnancy with other poor reproductive or obstetric history, unspecified trimester: Secondary | ICD-10-CM | POA: Insufficient documentation

## 2012-06-17 DIAGNOSIS — O358XX0 Maternal care for other (suspected) fetal abnormality and damage, not applicable or unspecified: Secondary | ICD-10-CM | POA: Insufficient documentation

## 2012-06-17 DIAGNOSIS — Z363 Encounter for antenatal screening for malformations: Secondary | ICD-10-CM | POA: Insufficient documentation

## 2012-06-17 DIAGNOSIS — O24919 Unspecified diabetes mellitus in pregnancy, unspecified trimester: Secondary | ICD-10-CM | POA: Insufficient documentation

## 2012-06-17 DIAGNOSIS — O0990 Supervision of high risk pregnancy, unspecified, unspecified trimester: Secondary | ICD-10-CM

## 2012-06-17 DIAGNOSIS — Z1389 Encounter for screening for other disorder: Secondary | ICD-10-CM | POA: Insufficient documentation

## 2012-06-17 NOTE — Progress Notes (Signed)
Christie [name] was seen for ultrasound appointment today.  Please see [facility] report for details.

## 2012-06-30 ENCOUNTER — Encounter: Payer: Self-pay | Admitting: Family Medicine

## 2012-06-30 ENCOUNTER — Ambulatory Visit (INDEPENDENT_AMBULATORY_CARE_PROVIDER_SITE_OTHER): Payer: Self-pay | Admitting: Family Medicine

## 2012-06-30 VITALS — BP 111/62 | Temp 97.7°F | Wt 197.5 lb

## 2012-06-30 DIAGNOSIS — E119 Type 2 diabetes mellitus without complications: Secondary | ICD-10-CM

## 2012-06-30 DIAGNOSIS — O0990 Supervision of high risk pregnancy, unspecified, unspecified trimester: Secondary | ICD-10-CM

## 2012-06-30 DIAGNOSIS — O24919 Unspecified diabetes mellitus in pregnancy, unspecified trimester: Secondary | ICD-10-CM

## 2012-06-30 LAB — POCT URINALYSIS DIP (DEVICE)
Glucose, UA: 100 mg/dL — AB
Hgb urine dipstick: NEGATIVE
Ketones, ur: NEGATIVE mg/dL
Protein, ur: NEGATIVE mg/dL
Specific Gravity, Urine: 1.025 (ref 1.005–1.030)
Urobilinogen, UA: 0.2 mg/dL (ref 0.0–1.0)

## 2012-06-30 NOTE — Patient Instructions (Signed)
Gestational Diabetes Mellitus Gestational diabetes mellitus (GDM) is diabetes that occurs only during pregnancy. This happens when the body cannot properly handle the glucose (sugar) that increases in the blood after eating. During pregnancy, insulin resistance (reduced sensitivity to insulin) occurs because of the release of hormones from the placenta. Usually, the pancreas of pregnant women produces enough insulin to overcome the resistance that occurs. However, in gestational diabetes, the insulin is there but it does not work effectively. If the resistance is severe enough that the pancreas does not produce enough insulin, extra glucose builds up in the blood.  WHO IS AT RISK FOR DEVELOPING GESTATIONAL DIABETES?  Women with a history of diabetes in the family.  Women over age 25.  Women who are overweight.  Women in certain ethnic groups (Hispanic, African American, Native American, Asian and Pacific Islander). WHAT CAN HAPPEN TO THE BABY? If the mother's blood glucose is too high while she is pregnant, the extra sugar will travel through the umbilical cord to the baby. Some of the problems the baby may have are:  Large Baby - If the baby receives too much sugar, the baby will gain more weight. This may cause the baby to be too large to be born normally (vaginally) and a Cesarean section (C-section) may be needed.  Low Blood Glucose (hypoglycemia)  The baby makes extra insulin, in response to the extra sugar its gets from its mother. When the baby is born and no longer needs this extra insulin, the baby's blood glucose level may drop.  Jaundice (yellow coloring of the skin and eyes)  This is fairly common in babies. It is caused from a build-up of the chemical called bilirubin. This is rarely serious, but is seen more often in babies whose mothers had gestational diabetes. RISKS TO THE MOTHER Women who have had gestational diabetes may be at higher risk for some problems,  including:  Preeclampsia or toxemia, which includes problems with high blood pressure. Blood pressure and protein levels in the urine must be checked frequently.  Infections.  Cesarean section (C-section) for delivery.  Developing Type 2 diabetes later in life. About 30-50% will develop diabetes later, especially if obese. DIAGNOSIS  The hormones that cause insulin resistance are highest at about 24-28 weeks of pregnancy. If symptoms are experienced, they are much like symptoms you would normally expect during pregnancy.  GDM is often diagnosed using a two part method: 1. After 24-28 weeks of pregnancy, the woman drinks a glucose solution and takes a blood test. If the glucose level is high, a second test will be given. 2. Oral Glucose Tolerance Test (OGTT) which is 3 hours long  After not eating overnight, the blood glucose is checked. The woman drinks a glucose solution, and hourly blood glucose tests are taken. If the woman has risk factors for GDM, the caregiver may test earlier than 24 weeks of pregnancy. TREATMENT  Treatment of GDM is directed at keeping the mother's blood glucose level normal, and may include:  Meal planning.  Taking insulin or other medicine to control your blood glucose level.  Exercise.  Keeping a daily record of the foods you eat.  Blood glucose monitoring and keeping a record of your blood glucose levels.  May monitor ketone levels in the urine, although this is no longer considered necessary in most pregnancies. HOME CARE INSTRUCTIONS  While you are pregnant:  Follow your caregiver's advice regarding your prenatal appointments, meal planning, exercise, medicines, vitamins, blood and other tests, and physical   activities.  Keep a record of your meals, blood glucose tests, and the amount of insulin you are taking (if any). Show this to your caregiver at every prenatal visit.  If you have GDM, you may have problems with hypoglycemia (low blood glucose).  You may suspect this if you become suddenly dizzy, feel shaky, and/or weak. If you think this is happening and you have a glucose meter, try to test your blood glucose level. Follow your caregiver's advice for when and how to treat your low blood glucose. Generally, the 15:15 rule is followed: Treat by consuming 15 grams of carbohydrates, wait 15 minutes, and recheck blood glucose. Examples of 15 grams of carbohydrates are:  1 cup skim or low-fat milk.   cup juice.  3-4 glucose tablets.  5-6 hard candies.  1 small box raisins.   cup regular soda pop.  Practice good hygiene, to avoid infections.  Do not smoke. SEEK MEDICAL CARE IF:   You develop abnormal vaginal discharge, with or without itching.  You become weak and tired more than expected.  You seem to sweat a lot.  You have a sudden increase in weight, 5 pounds or more in one week.  You are losing weight, 3 pounds or more in a week.  Your blood glucose level is high, and you need instructions on what to do about it. SEEK IMMEDIATE MEDICAL CARE IF:   You develop a severe headache.  You faint or pass out.  You develop nausea and vomiting.  You become disoriented or confused.  You have a convulsion.  You develop vision problems.  You develop stomach pain.  You develop vaginal bleeding.  You develop uterine contractions.  You have leaking or a gush of fluid from the vagina. AFTER YOU HAVE THE BABY:  Go to all of your follow-up appointments, and have blood tests as advised by your caregiver.  Maintain a healthy lifestyle, to prevent diabetes in the future. This includes:  Following a healthy meal plan.  Controlling your weight.  Getting enough exercise and proper rest.  Do not smoke.  Breastfeed your baby if you can. This will lower the chance of you and your baby developing diabetes later in life. For more information about diabetes, go to the American Diabetes Association at:  PMFashions.com.cy. For more information about gestational diabetes, go to the [employer] at: RentRule.com.au. Document Released: 10/01/2000 Document Revised: 09/17/2011 Document Reviewed: 04/25/2009 [facility] Patient Information 2013 [place], [place].  Pregnancy - Second Trimester The second trimester of pregnancy (3 to 6 months) is a period of rapid growth for you and your baby. At the end of the sixth month, your baby is about 9 inches long and weighs 1 1/2 pounds. You will begin to feel the baby move between 18 and 20 weeks of the pregnancy. This is called quickening. Weight gain is faster. A clear fluid (colostrum) may leak out of your breasts. You may feel small contractions of the womb (uterus). This is known as false labor or Braxton-Hicks contractions. This is like a practice for labor when the baby is ready to be born. Usually, the problems with morning sickness have usually passed by the end of your first trimester. Some women develop small dark blotches (called cholasma, mask of pregnancy) on their face that usually goes away after the baby is born. Exposure to the sun makes the blotches worse. Acne may also develop in some pregnant women and pregnant women who have acne, may find that  it goes away. PRENATAL EXAMS  Blood work may continue to be done during prenatal exams. These tests are done to check on your health and the probable health of your baby. Blood work is used to follow your blood levels (hemoglobin). Anemia (low hemoglobin) is common during pregnancy. Iron and vitamins are given to help prevent this. You will also be checked for diabetes between 24 and 28 weeks of the pregnancy. Some of the previous blood tests may be repeated.  The size of the uterus is measured during each visit. This is to make sure that the baby is continuing to grow properly according to the dates of the pregnancy.  Your blood pressure is checked every  prenatal visit. This is to make sure you are not getting toxemia.  Your urine is checked to make sure you do not have an infection, diabetes or protein in the urine.  Your weight is checked often to make sure gains are happening at the suggested rate. This is to ensure that both you and your baby are growing normally.  Sometimes, an ultrasound is performed to confirm the proper growth and development of the baby. This is a test which bounces harmless sound waves off the baby so your caregiver can more accurately determine due dates. Sometimes, a specialized test is done on the amniotic fluid surrounding the baby. This test is called an amniocentesis. The amniotic fluid is obtained by sticking a needle into the belly (abdomen). This is done to check the chromosomes in instances where there is a concern about possible genetic problems with the baby. It is also sometimes done near the end of pregnancy if an early delivery is required. In this case, it is done to help make sure the baby's lungs are mature enough for the baby to live outside of the womb. CHANGES OCCURING IN THE SECOND TRIMESTER OF PREGNANCY Your body goes through many changes during pregnancy. They vary from person to person. Talk to your caregiver about changes you notice that you are concerned about.  During the second trimester, you will likely have an increase in your appetite. It is normal to have cravings for certain foods. This varies from person to person and pregnancy to pregnancy.  Your lower abdomen will begin to bulge.  You may have to urinate more often because the uterus and baby are pressing on your bladder. It is also common to get more bladder infections during pregnancy (pain with urination). You can help this by drinking lots of fluids and emptying your bladder before and after intercourse.  You may begin to get stretch marks on your hips, abdomen, and breasts. These are normal changes in the body during pregnancy.  There are no exercises or medications to take that prevent this change.  You may begin to develop swollen and bulging veins (varicose veins) in your legs. Wearing support hose, elevating your feet for 15 minutes, 3 to 4 times a day and limiting salt in your diet helps lessen the problem.  Heartburn may develop as the uterus grows and pushes up against the stomach. Antacids recommended by your caregiver helps with this problem. Also, eating smaller meals 4 to 5 times a day helps.  Constipation can be treated with a stool softener or adding bulk to your diet. Drinking lots of fluids, vegetables, fruits, and whole grains are helpful.  Exercising is also helpful. If you have been very active up until your pregnancy, most of these activities can be continued during your pregnancy. If  you have been less active, it is helpful to start an exercise program such as walking.  Hemorrhoids (varicose veins in the rectum) may develop at the end of the second trimester. Warm sitz baths and hemorrhoid cream recommended by your caregiver helps hemorrhoid problems.  Backaches may develop during this time of your pregnancy. Avoid heavy lifting, wear low heal shoes and practice good posture to help with backache problems.  Some pregnant women develop tingling and numbness of their hand and fingers because of swelling and tightening of ligaments in the wrist (carpel tunnel syndrome). This goes away after the baby is born.  As your breasts enlarge, you may have to get a bigger bra. Get a comfortable, cotton, support bra. Do not get a nursing bra until the last month of the pregnancy if you will be nursing the baby.  You may get a dark line from your belly button to the pubic area called the linea nigra.  You may develop rosy cheeks because of increase blood flow to the face.  You may develop spider looking lines of the face, neck, arms and chest. These go away after the baby is born. HOME CARE INSTRUCTIONS   It is  extremely important to avoid all smoking, herbs, alcohol, and unprescribed drugs during your pregnancy. These chemicals affect the formation and growth of the baby. Avoid these chemicals throughout the pregnancy to ensure the delivery of a healthy infant.  Most of your home care instructions are the same as suggested for the first trimester of your pregnancy. Keep your caregiver's appointments. Follow your caregiver's instructions regarding medication use, exercise and diet.  During pregnancy, you are providing food for you and your baby. Continue to eat regular, well-balanced meals. Choose foods such as meat, fish, milk and other low fat dairy products, vegetables, fruits, and whole-grain breads and cereals. Your caregiver will tell you of the ideal weight gain.  A physical sexual relationship may be continued up until near the end of pregnancy if there are no other problems. Problems could include early (premature) leaking of amniotic fluid from the membranes, vaginal bleeding, abdominal pain, or other medical or pregnancy problems.  Exercise regularly if there are no restrictions. Check with your caregiver if you are unsure of the safety of some of your exercises. The greatest weight gain will occur in the last 2 trimesters of pregnancy. Exercise will help you:  Control your weight.  Get you in shape for labor and delivery.  Lose weight after you have the baby.  Wear a good support or jogging bra for breast tenderness during pregnancy. This may help if worn during sleep. Pads or tissues may be used in the bra if you are leaking colostrum.  Do not use hot tubs, steam rooms or saunas throughout the pregnancy.  Wear your seat belt at all times when driving. This protects you and your baby if you are in an accident.  Avoid raw meat, uncooked cheese, cat litter boxes and soil used by cats. These carry germs that can cause birth defects in the baby.  The second trimester is also a good time to  visit your dentist for your dental health if this has not been done yet. Getting your teeth cleaned is OK. Use a soft toothbrush. Brush gently during pregnancy.  It is easier to loose urine during pregnancy. Tightening up and strengthening the pelvic muscles will help with this problem. Practice stopping your urination while you are going to the bathroom. These are the same  muscles you need to strengthen. It is also the muscles you would use as if you were trying to stop from passing gas. You can practice tightening these muscles up 10 times a set and repeating this about 3 times per day. Once you know what muscles to tighten up, do not perform these exercises during urination. It is more likely to contribute to an infection by backing up the urine.  Ask for help if you have financial, counseling or nutritional needs during pregnancy. Your caregiver will be able to offer counseling for these needs as well as refer you for other special needs.  Your skin may become oily. If so, wash your face with mild soap, use non-greasy moisturizer and oil or cream based makeup. MEDICATIONS AND DRUG USE IN PREGNANCY  Take prenatal vitamins as directed. The vitamin should contain 1 milligram of folic acid. Keep all vitamins out of reach of children. Only a couple vitamins or tablets containing iron may be fatal to a baby or young child when ingested.  Avoid use of all medications, including herbs, over-the-counter medications, not prescribed or suggested by your caregiver. Only take over-the-counter or prescription medicines for pain, discomfort, or fever as directed by your caregiver. Do not use aspirin.  Let your caregiver also know about herbs you may be using.  Alcohol is related to a number of birth defects. This includes fetal alcohol syndrome. All alcohol, in any form, should be avoided completely. Smoking will cause low birth rate and premature babies.  Street or illegal drugs are very harmful to the baby.  They are absolutely forbidden. A baby born to an addicted mother will be addicted at birth. The baby will go through the same withdrawal an adult does. SEEK MEDICAL CARE IF:  You have any concerns or worries during your pregnancy. It is better to call with your questions if you feel they cannot wait, rather than worry about them. SEEK IMMEDIATE MEDICAL CARE IF:   An unexplained oral temperature above 102 F (38.9 C) develops, or as your caregiver suggests.  You have leaking of fluid from the vagina (birth canal). If leaking membranes are suspected, take your temperature and tell your caregiver of this when you call.  There is vaginal spotting, bleeding, or passing clots. Tell your caregiver of the amount and how many pads are used. Light spotting in pregnancy is common, especially following intercourse.  You develop a bad smelling vaginal discharge with a change in the color from clear to white.  You continue to feel sick to your stomach (nauseated) and have no relief from remedies suggested. You vomit blood or coffee ground-like materials.  You lose more than 2 pounds of weight or gain more than 2 pounds of weight over 1 week, or as suggested by your caregiver.  You notice swelling of your face, hands, feet, or legs.  You get exposed to [place] measles and have never had them.  You are exposed to fifth disease or chickenpox.  You develop belly (abdominal) pain. Round ligament discomfort is a common non-cancerous (benign) cause of abdominal pain in pregnancy. Your caregiver still must evaluate you.  You develop a bad headache that does not go away.  You develop fever, diarrhea, pain with urination, or shortness of breath.  You develop visual problems, blurry, or double vision.  You fall or are in a car accident or any kind of trauma.  There is mental or physical violence at home. Document Released: 06/19/2001 Document Revised: 09/17/2011 Document Reviewed: 12/22/2008  [employer]Care  Patient Information 2013 [place], [place].  Breastfeeding Deciding to breastfeed is one of the best choices you can make for you and your baby. The information that follows gives a brief overview of the benefits of breastfeeding as well as common topics surrounding breastfeeding. BENEFITS OF BREASTFEEDING For the baby  The first milk (colostrum) helps the baby's digestive system function better.   There are antibodies in the mother's milk that help the baby fight off infections.   The baby has a lower incidence of asthma, allergies, and sudden infant death syndrome (SIDS).   The nutrients in breast milk are better for the baby than infant formulas, and breast milk helps the baby's brain grow better.   Babies who breastfeed have less gas, colic, and constipation.  For the mother  Breastfeeding helps develop a very special bond between the mother and her baby.   Breastfeeding is convenient, always available at the correct temperature, and costs nothing.   Breastfeeding burns calories in the mother and helps her lose weight that was gained during pregnancy.   Breastfeeding makes the uterus contract back down to normal size faster and slows bleeding following delivery.   Breastfeeding mothers have a lower risk of developing breast cancer.  BREASTFEEDING FREQUENCY  A healthy, full-term baby may breastfeed as often as every hour or space his or her feedings to every 3 hours.   Watch your baby for signs of hunger. Nurse your baby if he or she shows signs of hunger. How often you nurse will vary from baby to baby.   Nurse as often as the baby requests, or when you feel the need to reduce the fullness of your breasts.   Awaken the baby if it has been 3 4 hours since the last feeding.   Frequent feeding will help the mother make more milk and will help prevent problems, such as sore nipples and engorgement of the breasts.  BABY'S POSITION AT THE BREAST  Whether lying down  or sitting, be sure that the baby's tummy is facing your tummy.   Support the breast with 4 fingers underneath the breast and the thumb above. Make sure your fingers are well away from the nipple and baby's mouth.   Stroke the baby's lips gently with your finger or nipple.   When the baby's mouth is open wide enough, place all of your nipple and as much of the areola as possible into your baby's mouth.   Pull the baby in close so the tip of the nose and the baby's cheeks touch the breast during the feeding.  FEEDINGS AND SUCTION  The length of each feeding varies from baby to baby and from feeding to feeding.   The baby must suck about 2 3 minutes for your milk to get to him or her. This is called a "let down." For this reason, allow the baby to feed on each breast as long as he or she wants. Your baby will end the feeding when he or she has received the right balance of nutrients.   To break the suction, put your finger into the corner of the baby's mouth and slide it between his or her gums before removing your breast from his or her mouth. This will help prevent sore nipples.  HOW TO TELL WHETHER YOUR BABY IS GETTING ENOUGH BREAST MILK. Wondering whether or not your baby is getting enough milk is a common concern among mothers. You can be assured that your baby is getting enough  milk if:   Your baby is actively sucking and you hear swallowing.   Your baby seems relaxed and satisfied after a feeding.   Your baby nurses at least 8 12 times in a 24 hour time period. Nurse your baby until he or she unlatches or falls asleep at the first breast (at least 10 20 minutes), then offer the second side.   Your baby is wetting 5 6 disposable diapers (6 8 cloth diapers) in a 24 hour period by 21 36 days of age.   Your baby is having at least 3 4 stools every 24 hours for the first 6 weeks. The stool should be soft and yellow.   Your baby should gain 4 7 ounces per week after he or she  is 64 days old.   Your breasts feel softer after nursing.  REDUCING BREAST ENGORGEMENT  In the first week after your baby is born, you may experience signs of breast engorgement. When breasts are engorged, they feel heavy, warm, full, and may be tender to the touch. You can reduce engorgement if you:   Nurse frequently, every 2 3 hours. Mothers who breastfeed early and often have fewer problems with engorgement.   Place light ice packs on your breasts for 10 20 minutes between feedings. This reduces swelling. Wrap the ice packs in a lightweight towel to protect your skin. Bags of frozen vegetables work well for this purpose.   Take a warm shower or apply warm, moist heat to your breast for 5 10 minutes just before each feeding. This increases circulation and helps the milk flow.   Gently massage your breast before and during the feeding. Using your finger tips, massage from the chest wall towards your nipple in a circular motion.   Make sure that the baby empties at least one breast at every feeding before switching sides.   Use a breast pump to empty the breasts if your baby is sleepy or not nursing well. You may also want to pump if you are returning to work oryou feel you are getting engorged.   Avoid bottle feeds, pacifiers, or supplemental feedings of water or juice in place of breastfeeding. Breast milk is all the food your baby needs. It is not necessary for your baby to have water or formula. In fact, to help your breasts make more milk, it is best not to give your baby supplemental feedings during the early weeks.   Be sure the baby is latched on and positioned properly while breastfeeding.   Wear a supportive bra, avoiding underwire styles.   Eat a balanced diet with enough fluids.   Rest often, relax, and take your prenatal vitamins to prevent fatigue, stress, and anemia.  If you follow these suggestions, your engorgement should improve in 24 48 hours. If you are  still experiencing difficulty, call your lactation consultant or caregiver.  CARING FOR YOURSELF Take care of your breasts  Bathe or shower daily.   Avoid using soap on your nipples.   Start feedings on your left breast at one feeding and on your right breast at the next feeding.   You will notice an increase in your milk supply 2 5 days after delivery. You may feel some discomfort from engorgement, which makes your breasts very firm and often tender. Engorgement "peaks" out within 24 48 hours. In the meantime, apply warm moist towels to your breasts for 5 10 minutes before feeding. Gentle massage and expression of some milk before feeding will  soften your breasts, making it easier for your baby to latch on.   Wear a well-fitting nursing bra, and air dry your nipples for a 3 after each feeding.   Only use cotton bra pads.   Only use pure lanolin on your nipples after nursing. You do not need to wash it off before feeding the baby again. Another option is to express a few drops of breast milk and gently massage it into your nipples.  Take care of yourself  Eat well-balanced meals and nutritious snacks.   Drinking milk, fruit juice, and water to satisfy your thirst (about 8 glasses a day).   Get plenty of rest.  Avoid foods that you notice affect the baby in a bad way.  SEEK MEDICAL CARE IF:   You have difficulty with breastfeeding and need help.   You have a hard, red, sore area on your breast that is accompanied by a fever.   Your baby is too sleepy to eat well or is having trouble sleeping.   Your baby is wetting less than 6 diapers a day, by 75 days of age.   Your baby's skin or white part of his or her eyes is more yellow than it was in the hospital.   You feel depressed.  Document Released: 06/25/2005 Document Revised: 12/25/2011 Document Reviewed: 09/23/2011 [facility] Patient Information 2013 [place], [place].

## 2012-06-30 NOTE — Progress Notes (Signed)
FBS 69-117 2 hr pp 50-127 Anatomy u/s reviewed and WNL--has f/u scheduled.  BS look good.

## 2012-06-30 NOTE — Progress Notes (Signed)
Pulse- 75  Edema-feet  Pain-"cramping"

## 2012-07-07 ENCOUNTER — Other Ambulatory Visit: Payer: Self-pay | Admitting: Family Medicine

## 2012-07-09 NOTE — L&D Delivery Note (Signed)
Delivery Note Pt pushed well and was able to eliminate ant lip. At 10:58 AM a viable female was delivered via Vaginal, Spontaneous Delivery (Presentation: Left Occiput Anterior).  APGAR: 5, 6; weight 9 lb 8 oz (4310 g).  Suprapubic pressure applied by RN allowing release of right shoulder. Tight nuchal/shoulder cord that was delivered through using the 'somersault' maneuver.   Infant dried; cord clamped and cut by FOB. Hospital cord blood sample collected. NICU team in room to assess. Placenta status: Intact, Spontaneous.  Cord: 3 vessels    Anesthesia: Epidural  Episiotomy: None Lacerations: None Est. Blood Loss (mL): 250  Mom to AICU.  Baby to nursery-stable.  Cam Hai 11/04/2012, 11:57 AM

## 2012-07-09 NOTE — L&D Delivery Note (Signed)
Attestation of Attending Supervision of Advanced Practitioner (CNM/NP): Evaluation and management procedures were performed by the Advanced Practitioner under my supervision and collaboration.  I have reviewed the Advanced Practitioner's note and chart, and I agree with the management and plan.  Wafaa Deemer 11/11/2012 9:41 AM   

## 2012-07-14 ENCOUNTER — Encounter: Payer: Self-pay | Admitting: Obstetrics and Gynecology

## 2012-07-16 ENCOUNTER — Ambulatory Visit: Payer: Self-pay | Admitting: Family Medicine

## 2012-07-21 ENCOUNTER — Ambulatory Visit (INDEPENDENT_AMBULATORY_CARE_PROVIDER_SITE_OTHER): Payer: Self-pay | Admitting: Obstetrics and Gynecology

## 2012-07-21 ENCOUNTER — Encounter: Payer: Self-pay | Admitting: Obstetrics and Gynecology

## 2012-07-21 VITALS — BP 116/71 | Temp 98.1°F | Wt 198.6 lb

## 2012-07-21 DIAGNOSIS — Z283 Underimmunization status: Secondary | ICD-10-CM

## 2012-07-21 DIAGNOSIS — J45909 Unspecified asthma, uncomplicated: Secondary | ICD-10-CM

## 2012-07-21 DIAGNOSIS — O09299 Supervision of pregnancy with other poor reproductive or obstetric history, unspecified trimester: Secondary | ICD-10-CM

## 2012-07-21 DIAGNOSIS — O9989 Other specified diseases and conditions complicating pregnancy, childbirth and the puerperium: Secondary | ICD-10-CM

## 2012-07-21 DIAGNOSIS — O24919 Unspecified diabetes mellitus in pregnancy, unspecified trimester: Secondary | ICD-10-CM

## 2012-07-21 LAB — POCT URINALYSIS DIP (DEVICE)
Bilirubin Urine: NEGATIVE
Hgb urine dipstick: NEGATIVE
Nitrite: NEGATIVE
Specific Gravity, Urine: 1.02 (ref 1.005–1.030)
pH: 7 (ref 5.0–8.0)

## 2012-07-21 NOTE — Progress Notes (Signed)
CBG elevated fasting 4/7 out of range pp 15/21 out of range. Reviewed diet with patient who states it hasn't changed. Explained role of growing placenta in increasing insulin requirements despite good diet. Will increase NPH 29 in am and 15 in pm. Patient otherwise doing well. Discussed scheduling fetal echo. Patient would like to wait for medicaid to come in

## 2012-07-21 NOTE — Progress Notes (Signed)
P=81 Patient complains of crampy feeling in her lower abdomen.  Patient has had hiccups through out entire pregnancy.

## 2012-07-29 ENCOUNTER — Ambulatory Visit (HOSPITAL_COMMUNITY)
Admission: RE | Admit: 2012-07-29 | Discharge: 2012-07-29 | Disposition: A | Discharge status: Home / Self Care | Payer: Medicaid Other | Source: Ambulatory Visit | Attending: Obstetrics & Gynecology | Admitting: Obstetrics & Gynecology

## 2012-07-29 ENCOUNTER — Other Ambulatory Visit: Payer: Self-pay | Admitting: Family Medicine

## 2012-07-29 VITALS — BP 117/76 | HR 98 | Wt 203.0 lb

## 2012-07-29 DIAGNOSIS — O24919 Unspecified diabetes mellitus in pregnancy, unspecified trimester: Secondary | ICD-10-CM | POA: Insufficient documentation

## 2012-07-29 DIAGNOSIS — O09299 Supervision of pregnancy with other poor reproductive or obstetric history, unspecified trimester: Secondary | ICD-10-CM | POA: Insufficient documentation

## 2012-07-29 DIAGNOSIS — O0990 Supervision of high risk pregnancy, unspecified, unspecified trimester: Secondary | ICD-10-CM

## 2012-07-29 DIAGNOSIS — Z283 Underimmunization status: Secondary | ICD-10-CM

## 2012-07-29 MED ORDER — INSULIN NPH (HUMAN) (ISOPHANE) 100 UNIT/ML ~~LOC~~ SUSP: SUBCUTANEOUS | Status: DC

## 2012-07-29 NOTE — Telephone Encounter (Signed)
Pt called with refill request of Novolin N insulin. Refill sent electronically based on new dosage instructions from Dr. Jolayne Panther on 07/21/12.  Pt voiced understanding. Refill had already been requested via [employer] however it is the wrong dosage and pt had Novolin N listed twice in her med list. List updated and incorrect insulin (Novolin N Relion) was d/c'd

## 2012-07-29 NOTE — Progress Notes (Signed)
Christie [name]  was seen today for an ultrasound appointment.  See full report in [facility].  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  Alpha Gula, MD

## 2012-07-30 ENCOUNTER — Other Ambulatory Visit: Payer: Self-pay | Admitting: *Deleted

## 2012-08-04 ENCOUNTER — Ambulatory Visit (INDEPENDENT_AMBULATORY_CARE_PROVIDER_SITE_OTHER): Payer: Medicaid Other | Admitting: Obstetrics & Gynecology

## 2012-08-04 VITALS — BP 118/73 | Temp 97.1°F | Wt 203.4 lb

## 2012-08-04 DIAGNOSIS — E119 Type 2 diabetes mellitus without complications: Secondary | ICD-10-CM

## 2012-08-04 DIAGNOSIS — O09299 Supervision of pregnancy with other poor reproductive or obstetric history, unspecified trimester: Secondary | ICD-10-CM

## 2012-08-04 DIAGNOSIS — O24919 Unspecified diabetes mellitus in pregnancy, unspecified trimester: Secondary | ICD-10-CM

## 2012-08-04 LAB — POCT URINALYSIS DIP (DEVICE)
Hgb urine dipstick: NEGATIVE
Ketones, ur: NEGATIVE mg/dL
Protein, ur: NEGATIVE mg/dL
pH: 6.5 (ref 5.0–8.0)

## 2012-08-04 MED ORDER — GLUCOSE BLOOD VI STRP: ORAL_STRIP | Status: DC

## 2012-08-04 NOTE — Progress Notes (Signed)
1/21 EFW 794g/75%, nml AFV.  Medicaid approved, will schedule ECHO.  CBGs still elevated across the board: Fasting 66-121; PP B 94-250, 300, L 79-247, D 100-218.  Increased NPH to 30 in am and 20 qhs, and regular to 15 units before B,L and D. No other complaints or concerns.  Fetal movement and labor precautions reviewed.

## 2012-08-04 NOTE — Patient Instructions (Signed)
Return to clinic for any obstetric concerns or go to [facility] for evaluation  

## 2012-08-11 ENCOUNTER — Ambulatory Visit (INDEPENDENT_AMBULATORY_CARE_PROVIDER_SITE_OTHER): Payer: Medicaid Other | Admitting: Obstetrics and Gynecology

## 2012-08-11 ENCOUNTER — Encounter: Payer: Self-pay | Admitting: Obstetrics and Gynecology

## 2012-08-11 VITALS — BP 104/73 | Temp 97.7°F | Wt 207.4 lb

## 2012-08-11 DIAGNOSIS — O09299 Supervision of pregnancy with other poor reproductive or obstetric history, unspecified trimester: Secondary | ICD-10-CM

## 2012-08-11 DIAGNOSIS — J45909 Unspecified asthma, uncomplicated: Secondary | ICD-10-CM

## 2012-08-11 DIAGNOSIS — E119 Type 2 diabetes mellitus without complications: Secondary | ICD-10-CM

## 2012-08-11 DIAGNOSIS — O9989 Other specified diseases and conditions complicating pregnancy, childbirth and the puerperium: Secondary | ICD-10-CM

## 2012-08-11 DIAGNOSIS — Z283 Underimmunization status: Secondary | ICD-10-CM

## 2012-08-11 DIAGNOSIS — O24919 Unspecified diabetes mellitus in pregnancy, unspecified trimester: Secondary | ICD-10-CM

## 2012-08-11 LAB — POCT URINALYSIS DIP (DEVICE)
Hgb urine dipstick: NEGATIVE
Ketones, ur: NEGATIVE mg/dL
Protein, ur: NEGATIVE mg/dL
Specific Gravity, Urine: 1.02 (ref 1.005–1.030)
Urobilinogen, UA: 0.2 mg/dL (ref 0.0–1.0)
pH: 7 (ref 5.0–8.0)

## 2012-08-11 MED ORDER — PROMETHAZINE HCL 25 MG PO TABS: 25.0000 mg | ORAL_TABLET | Freq: Four times a day (QID) | ORAL | Status: DC | PRN

## 2012-08-11 NOTE — Progress Notes (Signed)
Pulse- 98  Edema- legs/feet  Pain-left side, groin area when come from sit to stand postion

## 2012-08-11 NOTE — Progress Notes (Signed)
Patient here to received DM education and Nutrition counseling only. Please refer to their notes.

## 2012-08-11 NOTE — Progress Notes (Signed)
Patient doing well without complaints. CBG- f 95-157 2hr pp 98-170 (3/18 NORMAL). Will increase NPH to 32 in am and 22 q HS. Patient scheduled for fetal echo tomorrow. Reviewed diet with patient and importance of eating 3 meals a day with snacks in between.

## 2012-08-12 NOTE — Progress Notes (Signed)
This encounter was created in error - please disregard.

## 2012-08-14 ENCOUNTER — Encounter: Payer: Self-pay | Admitting: *Deleted

## 2012-08-18 ENCOUNTER — Ambulatory Visit (INDEPENDENT_AMBULATORY_CARE_PROVIDER_SITE_OTHER): Payer: Medicaid Other | Admitting: Family Medicine

## 2012-08-18 ENCOUNTER — Encounter: Payer: Self-pay | Admitting: Family Medicine

## 2012-08-18 VITALS — BP 107/69 | Temp 97.3°F | Wt 206.0 lb

## 2012-08-18 DIAGNOSIS — O24919 Unspecified diabetes mellitus in pregnancy, unspecified trimester: Secondary | ICD-10-CM

## 2012-08-18 LAB — POCT URINALYSIS DIP (DEVICE)
Hgb urine dipstick: NEGATIVE
Nitrite: NEGATIVE
Protein, ur: NEGATIVE mg/dL
Specific Gravity, Urine: 1.02 (ref 1.005–1.030)
Urobilinogen, UA: 0.2 mg/dL (ref 0.0–1.0)
pH: 7 (ref 5.0–8.0)

## 2012-08-18 MED ORDER — INSULIN NPH (HUMAN) (ISOPHANE) 100 UNIT/ML ~~LOC~~ SUSP: 22.0000 [IU] | Freq: Two times a day (BID) | SUBCUTANEOUS | Status: DC

## 2012-08-18 NOTE — Patient Instructions (Signed)
Gestational Diabetes Mellitus Gestational diabetes mellitus (GDM) is diabetes that occurs only during pregnancy. This happens when the body cannot properly handle the glucose (sugar) that increases in the blood after eating. During pregnancy, insulin resistance (reduced sensitivity to insulin) occurs because of the release of hormones from the placenta. Usually, the pancreas of pregnant women produces enough insulin to overcome the resistance that occurs. However, in gestational diabetes, the insulin is there but it does not work effectively. If the resistance is severe enough that the pancreas does not produce enough insulin, extra glucose builds up in the blood.  WHO IS AT RISK FOR DEVELOPING GESTATIONAL DIABETES?  Women with a history of diabetes in the family.  Women over age 25.  Women who are overweight.  Women in certain ethnic groups (Hispanic, African American, Native American, Asian and Pacific Islander). WHAT CAN HAPPEN TO THE BABY? If the mother's blood glucose is too high while she is pregnant, the extra sugar will travel through the umbilical cord to the baby. Some of the problems the baby may have are:  Large Baby - If the baby receives too much sugar, the baby will gain more weight. This may cause the baby to be too large to be born normally (vaginally) and a Cesarean section (C-section) may be needed.  Low Blood Glucose (hypoglycemia)  The baby makes extra insulin, in response to the extra sugar its gets from its mother. When the baby is born and no longer needs this extra insulin, the baby's blood glucose level may drop.  Jaundice (yellow coloring of the skin and eyes)  This is fairly common in babies. It is caused from a build-up of the chemical called bilirubin. This is rarely serious, but is seen more often in babies whose mothers had gestational diabetes. RISKS TO THE MOTHER Women who have had gestational diabetes may be at higher risk for some problems,  including:  Preeclampsia or toxemia, which includes problems with high blood pressure. Blood pressure and protein levels in the urine must be checked frequently.  Infections.  Cesarean section (C-section) for delivery.  Developing Type 2 diabetes later in life. About 30-50% will develop diabetes later, especially if obese. DIAGNOSIS  The hormones that cause insulin resistance are highest at about 24-28 weeks of pregnancy. If symptoms are experienced, they are much like symptoms you would normally expect during pregnancy.  GDM is often diagnosed using a two part method: 1. After 24-28 weeks of pregnancy, the woman drinks a glucose solution and takes a blood test. If the glucose level is high, a second test will be given. 2. Oral Glucose Tolerance Test (OGTT) which is 3 hours long  After not eating overnight, the blood glucose is checked. The woman drinks a glucose solution, and hourly blood glucose tests are taken. If the woman has risk factors for GDM, the caregiver may test earlier than 24 weeks of pregnancy. TREATMENT  Treatment of GDM is directed at keeping the mother's blood glucose level normal, and may include:  Meal planning.  Taking insulin or other medicine to control your blood glucose level.  Exercise.  Keeping a daily record of the foods you eat.  Blood glucose monitoring and keeping a record of your blood glucose levels.  May monitor ketone levels in the urine, although this is no longer considered necessary in most pregnancies. HOME CARE INSTRUCTIONS  While you are pregnant:  Follow your caregiver's advice regarding your prenatal appointments, meal planning, exercise, medicines, vitamins, blood and other tests, and physical   activities.  Keep a record of your meals, blood glucose tests, and the amount of insulin you are taking (if any). Show this to your caregiver at every prenatal visit.  If you have GDM, you may have problems with hypoglycemia (low blood glucose).  You may suspect this if you become suddenly dizzy, feel shaky, and/or weak. If you think this is happening and you have a glucose meter, try to test your blood glucose level. Follow your caregiver's advice for when and how to treat your low blood glucose. Generally, the 15:15 rule is followed: Treat by consuming 15 grams of carbohydrates, wait 15 minutes, and recheck blood glucose. Examples of 15 grams of carbohydrates are:  1 cup skim or low-fat milk.   cup juice.  3-4 glucose tablets.  5-6 hard candies.  1 small box raisins.   cup regular soda pop.  Practice good hygiene, to avoid infections.  Do not smoke. SEEK MEDICAL CARE IF:   You develop abnormal vaginal discharge, with or without itching.  You become weak and tired more than expected.  You seem to sweat a lot.  You have a sudden increase in weight, 5 pounds or more in one week.  You are losing weight, 3 pounds or more in a week.  Your blood glucose level is high, and you need instructions on what to do about it. SEEK IMMEDIATE MEDICAL CARE IF:   You develop a severe headache.  You faint or pass out.  You develop nausea and vomiting.  You become disoriented or confused.  You have a convulsion.  You develop vision problems.  You develop stomach pain.  You develop vaginal bleeding.  You develop uterine contractions.  You have leaking or a gush of fluid from the vagina. AFTER YOU HAVE THE BABY:  Go to all of your follow-up appointments, and have blood tests as advised by your caregiver.  Maintain a healthy lifestyle, to prevent diabetes in the future. This includes:  Following a healthy meal plan.  Controlling your weight.  Getting enough exercise and proper rest.  Do not smoke.  Breastfeed your baby if you can. This will lower the chance of you and your baby developing diabetes later in life. For more information about diabetes, go to the American Diabetes Association at:  www.americandiabetesassociation.org. For more information about gestational diabetes, go to the American Congress of Obstetricians and Gynecologists at: www.acog.org. Document Released: 10/01/2000 Document Revised: 09/17/2011 Document Reviewed: 04/25/2009 [employer] Patient Information 2013 [employer].  Pregnancy - Third Trimester The third trimester of pregnancy (the last 3 months) is a period of the most rapid growth for you and your baby. The baby approaches a length of 20 inches and a weight of 6 to 10 pounds. The baby is adding on fat and getting ready for life outside your body. While inside, babies have periods of sleeping and waking, suck their thumbs, and hiccups. You can often feel small contractions of the uterus. This is false labor. It is also called Braxton-Hicks contractions. This is like a practice for labor. The usual problems in this stage of pregnancy include more difficulty breathing, swelling of the hands and feet from water retention, and having to urinate more often because of the uterus and baby pressing on your bladder.  PRENATAL EXAMS  Blood work may continue to be done during prenatal exams. These tests are done to check on your health and the probable health of your baby. Blood work is used to follow your blood levels (hemoglobin). Anemia (  low hemoglobin) is common during pregnancy. Iron and vitamins are given to help prevent this. You may also continue to be checked for diabetes. Some of the past blood tests may be done again.  The size of the uterus is measured during each visit. This makes sure your baby is growing properly according to your pregnancy dates.  Your blood pressure is checked every prenatal visit. This is to make sure you are not getting toxemia.  Your urine is checked every prenatal visit for infection, diabetes and protein.  Your weight is checked at each visit. This is done to make sure gains are happening at the suggested rate and that you and your  baby are growing normally.  Sometimes, an ultrasound is performed to confirm the position and the proper growth and development of the baby. This is a test done that bounces harmless sound waves off the baby so your caregiver can more accurately determine due dates.  Discuss the type of pain medication and anesthesia you will have during your labor and delivery.  Discuss the possibility and anesthesia if a Cesarean Section might be necessary.  Inform your caregiver if there is any mental or physical violence at home. Sometimes, a specialized non-stress test, contraction stress test and biophysical profile are done to make sure the baby is not having a problem. Checking the amniotic fluid surrounding the baby is called an amniocentesis. The amniotic fluid is removed by sticking a needle into the belly (abdomen). This is sometimes done near the end of pregnancy if an early delivery is required. In this case, it is done to help make sure the baby's lungs are mature enough for the baby to live outside of the womb. If the lungs are not mature and it is unsafe to deliver the baby, an injection of cortisone medication is given to the mother 1 to 2 days before the delivery. This helps the baby's lungs mature and makes it safer to deliver the baby. CHANGES OCCURING IN THE THIRD TRIMESTER OF PREGNANCY Your body goes through many changes during pregnancy. They vary from person to person. Talk to your caregiver about changes you notice and are concerned about.  During the last trimester, you have probably had an increase in your appetite. It is normal to have cravings for certain foods. This varies from person to person and pregnancy to pregnancy.  You may begin to get stretch marks on your hips, abdomen, and breasts. These are normal changes in the body during pregnancy. There are no exercises or medications to take which prevent this change.  Constipation may be treated with a stool softener or adding bulk to  your diet. Drinking lots of fluids, fiber in vegetables, fruits, and whole grains are helpful.  Exercising is also helpful. If you have been very active up until your pregnancy, most of these activities can be continued during your pregnancy. If you have been less active, it is helpful to start an exercise program such as walking. Consult your caregiver before starting exercise programs.  Avoid all smoking, alcohol, un-prescribed drugs, herbs and "street drugs" during your pregnancy. These chemicals affect the formation and growth of the baby. Avoid chemicals throughout the pregnancy to ensure the delivery of a healthy infant.  Backache, varicose veins and hemorrhoids may develop or get worse.  You will tire more easily in the third trimester, which is normal.  The baby's movements may be stronger and more often.  You may become short of breath easily.  Your belly   button may stick out.  A yellow discharge may leak from your breasts called colostrum.  You may have a bloody mucus discharge. This usually occurs a few days to a week before labor begins. HOME CARE INSTRUCTIONS   Keep your caregiver's appointments. Follow your caregiver's instructions regarding medication use, exercise, and diet.  During pregnancy, you are providing food for you and your baby. Continue to eat regular, well-balanced meals. Choose foods such as meat, fish, milk and other low fat dairy products, vegetables, fruits, and whole-grain breads and cereals. Your caregiver will tell you of the ideal weight gain.  A physical sexual relationship may be continued throughout pregnancy if there are no other problems such as early (premature) leaking of amniotic fluid from the membranes, vaginal bleeding, or belly (abdominal) pain.  Exercise regularly if there are no restrictions. Check with your caregiver if you are unsure of the safety of your exercises. Greater weight gain will occur in the last 2 trimesters of pregnancy.  Exercising helps:  Control your weight.  Get you in shape for labor and delivery.  You lose weight after you deliver.  Rest a lot with legs elevated, or as needed for leg cramps or low back pain.  Wear a good support or jogging bra for breast tenderness during pregnancy. This may help if worn during sleep. Pads or tissues may be used in the bra if you are leaking colostrum.  Do not use hot tubs, steam rooms, or saunas.  Wear your seat belt when driving. This protects you and your baby if you are in an accident.  Avoid raw meat, cat litter boxes and soil used by cats. These carry germs that can cause birth defects in the baby.  It is easier to loose urine during pregnancy. Tightening up and strengthening the pelvic muscles will help with this problem. You can practice stopping your urination while you are going to the bathroom. These are the same muscles you need to strengthen. It is also the muscles you would use if you were trying to stop from passing gas. You can practice tightening these muscles up 10 times a set and repeating this about 3 times per day. Once you know what muscles to tighten up, do not perform these exercises during urination. It is more likely to cause an infection by backing up the urine.  Ask for help if you have financial, counseling or nutritional needs during pregnancy. Your caregiver will be able to offer counseling for these needs as well as refer you for other special needs.  Make a list of emergency phone numbers and have them available.  Plan on getting help from family or friends when you go home from the hospital.  Make a trial run to the hospital.  Take prenatal classes with the father to understand, practice and ask questions about the labor and delivery.  Prepare the baby's room/nursery.  Do not travel out of the city unless it is absolutely necessary and with the advice of your caregiver.  Wear only low or no heal shoes to have better balance and  prevent falling. MEDICATIONS AND DRUG USE IN PREGNANCY  Take prenatal vitamins as directed. The vitamin should contain 1 milligram of folic acid. Keep all vitamins out of reach of children. Only a couple vitamins or tablets containing iron may be fatal to a baby or young child when ingested.  Avoid use of all medications, including herbs, over-the-counter medications, not prescribed or suggested by your caregiver. Only take over-the-counter or   prescription medicines for pain, discomfort, or fever as directed by your caregiver. Do not use aspirin, ibuprofen (Motrin, Advil, Nuprin) or naproxen (Aleve) unless OK'd by your caregiver.  Let your caregiver also know about herbs you may be using.  Alcohol is related to a number of birth defects. This includes fetal alcohol syndrome. All alcohol, in any form, should be avoided completely. Smoking will cause low birth rate and premature babies.  Street/illegal drugs are very harmful to the baby. They are absolutely forbidden. A baby born to an addicted mother will be addicted at birth. The baby will go through the same withdrawal an adult does. SEEK MEDICAL CARE IF: You have any concerns or worries during your pregnancy. It is better to call with your questions if you feel they cannot wait, rather than worry about them. DECISIONS ABOUT CIRCUMCISION You may or may not know the sex of your baby. If you know your baby is a boy, it may be time to think about circumcision. Circumcision is the removal of the foreskin of the penis. This is the skin that covers the sensitive end of the penis. There is no proven medical need for this. Often this decision is made on what is popular at the time or based upon religious beliefs and social issues. You can discuss these issues with your caregiver or pediatrician. SEEK IMMEDIATE MEDICAL CARE IF:   An unexplained oral temperature above 102 F (38.9 C) develops, or as your caregiver suggests.  You have leaking of fluid  from the vagina (birth canal). If leaking membranes are suspected, take your temperature and tell your caregiver of this when you call.  There is vaginal spotting, bleeding or passing clots. Tell your caregiver of the amount and how many pads are used.  You develop a bad smelling vaginal discharge with a change in the color from clear to white.  You develop vomiting that lasts more than 24 hours.  You develop chills or fever.  You develop shortness of breath.  You develop burning on urination.  You loose more than 2 pounds of weight or gain more than 2 pounds of weight or as suggested by your caregiver.  You notice sudden swelling of your face, hands, and feet or legs.  You develop belly (abdominal) pain. Round ligament discomfort is a common non-cancerous (benign) cause of abdominal pain in pregnancy. Your caregiver still must evaluate you.  You develop a severe headache that does not go away.  You develop visual problems, blurred or double vision.  If you have not felt your baby move for more than 1 hour. If you think the baby is not moving as much as usual, eat something with sugar in it and lie down on your left side for an hour. The baby should move at least 4 to 5 times per hour. Call right away if your baby moves less than that.  You fall, are in a car accident or any kind of trauma.  There is mental or physical violence at home. Document Released: 06/19/2001 Document Revised: 09/17/2011 Document Reviewed: 12/22/2008 [employer] Patient Information 2013 [employer].  

## 2012-08-18 NOTE — Progress Notes (Signed)
FBS 83-120 3 of 8 out of range 2hr pp 98-187 13 of 17  Too much Ginger Ale Increase NPH to 34u in am 22 u q hs

## 2012-08-18 NOTE — Progress Notes (Signed)
Pulse 83  Edema trace in legs and feet. C/o groin pain and LLQ of abdomen. No pressure.

## 2012-08-25 ENCOUNTER — Encounter: Payer: Medicaid Other | Admitting: Obstetrics and Gynecology

## 2012-08-26 ENCOUNTER — Encounter (HOSPITAL_COMMUNITY): Payer: Self-pay | Admitting: *Deleted

## 2012-08-26 ENCOUNTER — Ambulatory Visit (HOSPITAL_COMMUNITY): Payer: Medicaid Other | Attending: Obstetrics & Gynecology

## 2012-08-26 ENCOUNTER — Inpatient Hospital Stay (HOSPITAL_COMMUNITY)
Admission: AD | Admit: 2012-08-26 | Discharge: 2012-08-26 | Disposition: A | Discharge status: Home / Self Care | Payer: Medicaid Other | Source: Ambulatory Visit | Attending: Obstetrics and Gynecology | Admitting: Obstetrics and Gynecology

## 2012-08-26 DIAGNOSIS — E119 Type 2 diabetes mellitus without complications: Secondary | ICD-10-CM | POA: Insufficient documentation

## 2012-08-26 DIAGNOSIS — O99891 Other specified diseases and conditions complicating pregnancy: Secondary | ICD-10-CM

## 2012-08-26 DIAGNOSIS — R42 Dizziness and giddiness: Secondary | ICD-10-CM

## 2012-08-26 DIAGNOSIS — E860 Dehydration: Secondary | ICD-10-CM | POA: Insufficient documentation

## 2012-08-26 DIAGNOSIS — O24919 Unspecified diabetes mellitus in pregnancy, unspecified trimester: Secondary | ICD-10-CM

## 2012-08-26 LAB — CBC
HCT: 32 % — ABNORMAL LOW (ref 36.0–46.0)
MCH: 24 pg — ABNORMAL LOW (ref 26.0–34.0)
MCV: 73.2 fL — ABNORMAL LOW (ref 78.0–100.0)
Platelets: 192 10*3/uL (ref 150–400)
RDW: 15.2 % (ref 11.5–15.5)

## 2012-08-26 LAB — COMPREHENSIVE METABOLIC PANEL
AST: 23 U/L (ref 0–37)
Albumin: 2.6 g/dL — ABNORMAL LOW (ref 3.5–5.2)
BUN: 9 mg/dL (ref 6–23)
CO2: 22 mEq/L (ref 19–32)
Calcium: 8.8 mg/dL (ref 8.4–10.5)
Chloride: 103 mEq/L (ref 96–112)
Creatinine, Ser: 0.56 mg/dL (ref 0.50–1.10)
GFR calc non Af Amer: >90 mL/min (ref >90)
Total Bilirubin: 0.4 mg/dL (ref 0.3–1.2)

## 2012-08-26 LAB — URINALYSIS, ROUTINE W REFLEX MICROSCOPIC
Bilirubin Urine: NEGATIVE
Glucose, UA: 500 mg/dL — AB
Hgb urine dipstick: NEGATIVE
Protein, ur: 100 mg/dL — AB
Urobilinogen, UA: 0.2 mg/dL (ref 0.0–1.0)

## 2012-08-26 LAB — URINE MICROSCOPIC-ADD ON

## 2012-08-26 MED ORDER — SODIUM CHLORIDE 0.9 % IV SOLN
25.0000 mg | Freq: Once | INTRAVENOUS | Status: AC
  Administered 2012-08-26: 25 mg via INTRAVENOUS
  Filled 2012-08-26: qty 1

## 2012-08-26 NOTE — [facility] Note (Signed)
States felt a cramp in lower abdomen when all this happened and still feels "crampy."

## 2012-08-26 NOTE — MAU Provider Note (Signed)
Chief Complaint:  Dizziness   First Provider Initiated Contact with Patient 08/26/12 1348      HPI: Christie [name] is a 27 y.o. G3P1011 at [redacted]w[redacted]d who presents to maternity admissions reporting single episode of dizziness, nausea, and blurred vision this morning while driving.  Pt reports she has not had anything to drink today.  She is feeling nauseous but denies other symptoms currently.  She is pt of [facility] for Type I DM.  She reports good fetal movement, denies LOF, vaginal bleeding, vaginal itching/burning, urinary symptoms, h/a, or fever/chills.     Past Medical History: Past Medical History  Diagnosis Date  . Asthma   . Hx of migraines   . Anemia   . Depression   . Fx ankle     history of left ankle fx at age 44  . Pregnancy induced hypertension   . Diabetes mellitus     type 1 on insulin  . Headache   . Urinary tract infection   . Chlamydia   . Abnormal Pap smear     colpo scheduled  . Anxiety     Past obstetric history: OB History   Grav Para Term Preterm Abortions TAB SAB Ect Mult Living   3 1 1  0 1 1 0 0 0 1     # Outc Date GA Lbr Len/2nd Wgt Sex Del Anes PTL Lv   1 TRM 3/09   4.252kg(9lb6oz) M SVD EPI No Yes   Comments: pre eclampsia   2 TAB 6/13 [redacted]w[redacted]d          Comments: no complications   3 CUR               Past Surgical History: Past Surgical History  Procedure Laterality Date  . Wisdom tooth extraction    . Colposcopy      Family History: Family History  Problem Relation Age of Onset  . Asthma Mother   . Diabetes Mother   . Hypertension Mother   . Asthma Father   . Anesthesia problems Neg Hx   . Hypertension Sister     Social History: History  Substance Use Topics  . Smoking status: Never Smoker   . Smokeless tobacco: Never Used  . Alcohol Use: No     Comment: occasional (once a month)- not (+) UPT    Allergies:  Allergies  Allergen Reactions  . Banana Anaphylaxis  . Citrus Other (See Comments)    Gums bleed  . Adhesive (Tape)  Rash    Meds:  Prescriptions prior to admission  Medication Sig Dispense Refill  . insulin NPH (HUMULIN N,NOVOLIN N) 100 UNIT/ML injection Inject 22-34 Units into the skin 2 (two) times daily. Inject 34 units sq q am, 22 units sq q hs      . insulin regular (NOVOLIN R,HUMULIN R) 100 units/mL injection Inject 15 Units into the skin 3 (three) times daily before meals. 10 u sq q am, and 10 u sq q ac dinner (evening meal)      . Prenatal Vit-Fe Fumarate-FA (PRENATAL MULTIVITAMIN) TABS Take 1 tablet by mouth every morning.      . [DISCONTINUED] insulin NPH (HUMULIN N,NOVOLIN N) 100 UNIT/ML injection Inject 22-34 Units into the skin 2 (two) times daily. Inject 34 units sq q am, 22 units sq q hs  1 vial  3  . albuterol (PROVENTIL HFA;VENTOLIN HFA) 108 (90 BASE) MCG/ACT inhaler Inhale 2 puffs into the lungs every 6 (six) hours as needed. For shortness of  breath or wheezing  1 Inhaler  5  . glucose blood (ACCU-CHEK SMARTVIEW) test strip Use as instructed to check blood sugars  100 each  12    ROS: Pertinent findings in history of present illness.  Physical Exam  Blood pressure 95/63, pulse 94, temperature 97.6 F (36.4 C), temperature source Oral, resp. rate 18, last menstrual period 02/11/2012. GENERAL: Well-developed, well-nourished female in no acute distress.  HEENT: normocephalic HEART: normal rate RESP: normal effort ABDOMEN: Soft, non-tender, gravid appropriate for gestational age EXTREMITIES: Nontender, no edema NEURO: alert and oriented     FHT:  Baseline 145 , moderate variability, accelerations present, no decelerations Contractions: None   Labs: Results for orders placed during the hospital encounter of 08/26/12 (from the past 48 hour(s))  URINE CULTURE     Status: None   Collection Time    08/26/12 11:00 AM      Result Value Range   Specimen Description URINE, CLEAN CATCH     Special Requests NONE     Culture  Setup Time 08/26/2012 21:44     Colony Count 3,000 COLONIES/ML      Culture INSIGNIFICANT GROWTH     Report Status 08/27/2012 FINAL    GLUCOSE, CAPILLARY     Status: Abnormal   Collection Time    08/26/12 11:46 AM      Result Value Range   Glucose-Capillary 139 (*) 70 - 99 mg/dL   Comment 1 Notify RN    CBC     Status: Abnormal   Collection Time    08/26/12  1:44 PM      Result Value Range   WBC 6.2  4.0 - 10.5 K/uL   RBC 4.37  3.87 - 5.11 MIL/uL   Hemoglobin 10.5 (*) 12.0 - 15.0 g/dL   HCT 96.0 (*) 45.4 - 09.8 %   MCV 73.2 (*) 78.0 - 100.0 fL   MCH 24.0 (*) 26.0 - 34.0 pg   MCHC 32.8  30.0 - 36.0 g/dL   RDW 11.9  14.7 - 82.9 %   Platelets 192  150 - 400 K/uL  COMPREHENSIVE METABOLIC PANEL     Status: Abnormal   Collection Time    08/26/12  1:44 PM      Result Value Range   Sodium 135  135 - 145 mEq/L   Potassium 4.0  3.5 - 5.1 mEq/L   Chloride 103  96 - 112 mEq/L   CO2 22  19 - 32 mEq/L   Glucose, Bld 126 (*) 70 - 99 mg/dL   BUN 9  6 - 23 mg/dL   Creatinine, Ser 5.62  0.50 - 1.10 mg/dL   Calcium 8.8  8.4 - 13.0 mg/dL   Total Protein 7.1  6.0 - 8.3 g/dL   Albumin 2.6 (*) 3.5 - 5.2 g/dL   AST 23  0 - 37 U/L   ALT 15  0 - 35 U/L   Alkaline Phosphatase 71  39 - 117 U/L   Total Bilirubin 0.4  0.3 - 1.2 mg/dL   GFR calc non Af Amer >90  >90 mL/min   GFR calc Af Amer >90  >90 mL/min   Comment:            The eGFR has been calculated     using the CKD EPI equation.     This calculation has not been     validated in all clinical     situations.     eGFR's persistently     <  90 mL/min signify     possible Chronic Kidney Disease.  URINALYSIS, ROUTINE W REFLEX MICROSCOPIC     Status: Abnormal   Collection Time    08/26/12  1:54 PM      Result Value Range   Color, Urine YELLOW  YELLOW   APPearance HAZY (*) CLEAR   Specific Gravity, Urine 1.025  1.005 - 1.030   pH 7.0  5.0 - 8.0   Glucose, UA 500 (*) NEGATIVE mg/dL   Hgb urine dipstick NEGATIVE  NEGATIVE   Bilirubin Urine NEGATIVE  NEGATIVE   Ketones, ur 15 (*) NEGATIVE mg/dL    Protein, ur 161 (*) NEGATIVE mg/dL   Urobilinogen, UA 0.2  0.0 - 1.0 mg/dL   Nitrite NEGATIVE  NEGATIVE   Leukocytes, UA TRACE (*) NEGATIVE  URINE MICROSCOPIC-ADD ON     Status: Abnormal   Collection Time    08/26/12  1:54 PM      Result Value Range   Squamous Epithelial / LPF MANY (*) RARE   WBC, UA 3-6  <3 WBC/hpf   Bacteria, UA FEW (*) RARE   Urine-Other MUCOUS PRESENT       Assessment: 1. Episode of dizziness   2. Dehydration   3. Diabetes mellitus, antepartum     Plan: Discharge home Drink plenty of water Keep scheduled prenatal appointments Return to MAU as needed     Medication List    ASK your doctor about these medications       albuterol 108 (90 BASE) MCG/ACT inhaler  Commonly known as:  PROVENTIL HFA;VENTOLIN HFA  Inhale 2 puffs into the lungs every 6 (six) hours as needed. For shortness of breath or wheezing     glucose blood test strip  Commonly known as:  ACCU-CHEK SMARTVIEW  Use as instructed to check blood sugars     insulin NPH 100 UNIT/ML injection  Commonly known as:  HUMULIN N,NOVOLIN N  Inject 22-34 Units into the skin 2 (two) times daily. Inject 34 units sq q am, 22 units sq q hs     insulin regular 100 units/mL injection  Commonly known as:  NOVOLIN R,HUMULIN R  Inject 15 Units into the skin 3 (three) times daily before meals. 10 u sq q am, and 10 u sq q ac dinner (evening meal)     prenatal multivitamin Tabs  Take 1 tablet by mouth every morning.        Sharen Counter Certified Nurse-Midwife 08/26/2012 1:52 PM

## 2012-08-26 NOTE — MAU Note (Addendum)
States when she woke up, she felt dizzy and vision was blurred. States was driving son to school. Became hot and dizzy. Pulled over to side of road. Felt nauseated. Called 911. Came in by EMS. States this has occurred before. States she has not had anything to eat or drink since last night.

## 2012-08-27 ENCOUNTER — Telehealth: Payer: Self-pay | Admitting: *Deleted

## 2012-08-27 LAB — URINE CULTURE: Colony Count: 3000

## 2012-08-27 LAB — GLUCOSE, CAPILLARY

## 2012-08-27 MED ORDER — INSULIN REGULAR HUMAN 100 UNIT/ML IJ SOLN
15.0000 [IU] | Freq: Three times a day (TID) | INTRAMUSCULAR | Status: DC
Start: 2012-08-27 — End: 2012-09-22

## 2012-08-27 NOTE — Telephone Encounter (Signed)
Pt called requesting that her Novolin R be sent to her pharmacy. It was not sent in yesterday. Per order in epic rx sent in.

## 2012-08-27 NOTE — Progress Notes (Signed)
08/27/12  1430  Received a call from Synetta Fail - onsite DSS Medicaid worker, who stated that their IT person is still working on the issue and they doubted that this would be resolved today.  I spoke w/ CM AD and will approve medication assistance through the [facility] program.  MATCH Letter taken to clinic and explained that pt would need to go to one of the pharmacies listed - [facility] the [facility], take the [facility] Letter and present to the pharmacist, there is a $3.00 copay per medication and this letter was good until 09/02/12.  Clinic voiced understanding and will notify pt.  Hessie Diener  [phone]   08/27/12  1100a  Notified by Clinic of pt's need for medication assistance.  Pt was seen in MAU on 08/26/12.  At dc, medication was called into her pharmacy [facility] [address]). Pt went to pharmacy and was told that her Medicaid was not active.  Per EPIC, pt's Medicaid is active as of 07/29/12.  I called [facility] pharmacy and the pharmacist ran a claim at that time and it is still showing inactive Medicaid.  I spoke w/ onsite DSS Medicaid worker Synetta Fail) and they are aware of this issue and they have their IT person working on it with the [facility] pharmacy.  Requested that Synetta Fail call me befor 4pm today w/ status update.  I spoke w/ the clinic to let them know the status of this request.  TJohnson, RNBSN  [phone].

## 2012-08-28 NOTE — Care Management Note (Signed)
    Page 1 of 1   08/27/2012     2:56:25 PM   CARE MANAGEMENT NOTE 08/27/2012  Patient:  Christie [name], Christie [name]   Account Number:  [id number]  Date Initiated:  08/27/2012  Documentation initiated by:  Roseanne Reno  Subjective/Objective Assessment:   Single episode of dizziness, nausea, and blurred vision this morning while driving.  Pt reports she has not had anything to drink today.  She is feeling nauseous but denies other symptoms currently.  She is pt of [facility] for Type I DM.     Action/Plan:   Medication assistance   Anticipated DC Date:  08/26/2012   Anticipated DC Plan:  HOME/SELF CARE      Status of service:  Completed, signed off  Discharge Disposition:  HOME/SELF CARE  Comments:  08/27/12  1430  Received a call from [place] - onsite DSS Medicaid worker, who stated that their IT person is still working on the issue and they doubted that this would be fixed today.  I spoke w/ CM AD and will approve medication assistance through the [facility] program.  MATCH Letter taken to clinic and explained that pt would need to go to one of the pharmacies listed - [facility] on the list, take the [facility] Letter and present to the pharmacist, there is a $3.00 copay per medication and this letter was good until 09/02/12.  Clinic voiced understanding and will notify pt.  Hessie Diener  [phone]   08/27/12  1100a  Notified by Clinic of pt's need for medication assistance.  Pt was seen in MAU on 08/26/12.  At dc, medication was called into her pharmacy [facility] on [address]). Pt went to pharmacy and was told that her Medicaid was not active.  Per EPIC, pt's Medicaid is active as of 07/29/12.  I called [facility] pharmacy and the pharmacist ran a claim at that time and it is still showing inactive Medicaid.  I spoke w/ onsite DSS Medicaid worker Synetta Fail) and they are aware of this issue and they have their IT person working on it with the [facility] pharmacy. Requested that Synetta Fail call me befor 4pm today w/ status  update.  I spoke w/ the clinic to let them know the status of this request.  TJohnson, RNBSN  [phone].

## 2012-08-29 NOTE — MAU Provider Note (Signed)
Attestation of Attending Supervision of Advanced Practitioner (CNM/NP): Evaluation and management procedures were performed by the Advanced Practitioner under my supervision and collaboration.  I have reviewed the Advanced Practitioner's note and chart, and I agree with the management and plan.  Shraddha Lebron 08/29/2012 9:36 AM

## 2012-09-01 ENCOUNTER — Encounter: Payer: Medicaid Other | Admitting: Obstetrics and Gynecology

## 2012-09-02 ENCOUNTER — Ambulatory Visit (HOSPITAL_COMMUNITY)
Admission: RE | Admit: 2012-09-02 | Discharge: 2012-09-02 | Disposition: A | Discharge status: Home / Self Care | Payer: Medicaid Other | Source: Ambulatory Visit | Attending: Obstetrics and Gynecology | Admitting: Obstetrics and Gynecology

## 2012-09-02 ENCOUNTER — Encounter (HOSPITAL_COMMUNITY): Payer: Self-pay

## 2012-09-02 VITALS — BP 116/74 | HR 89 | Wt 211.5 lb

## 2012-09-02 DIAGNOSIS — O09299 Supervision of pregnancy with other poor reproductive or obstetric history, unspecified trimester: Secondary | ICD-10-CM | POA: Insufficient documentation

## 2012-09-02 DIAGNOSIS — O24919 Unspecified diabetes mellitus in pregnancy, unspecified trimester: Secondary | ICD-10-CM | POA: Insufficient documentation

## 2012-09-02 NOTE — Progress Notes (Signed)
JAVEN RIDINGS  was seen today for an ultrasound appointment.  See full report in [facility].  Impression: Single IUP at 29 1/7 weeks Pregestational diabetes on insulin Normal interval anatomy Interval growth is appropriate (81th %tile) Normal amniotic fluid volume  Recommendations: Recommend follow-up ultrasound examination in 4 weeks for interval growth. Recommend 2x weekly NSTs beginning at 32 weeks.  Alpha Gula, MD

## 2012-09-08 ENCOUNTER — Ambulatory Visit (INDEPENDENT_AMBULATORY_CARE_PROVIDER_SITE_OTHER): Payer: Medicaid Other | Admitting: Obstetrics and Gynecology

## 2012-09-08 ENCOUNTER — Other Ambulatory Visit: Payer: Self-pay | Admitting: Obstetrics and Gynecology

## 2012-09-08 ENCOUNTER — Encounter: Payer: Self-pay | Admitting: Obstetrics and Gynecology

## 2012-09-08 VITALS — BP 106/68 | Temp 97.8°F | Wt 214.0 lb

## 2012-09-08 DIAGNOSIS — O09293 Supervision of pregnancy with other poor reproductive or obstetric history, third trimester: Secondary | ICD-10-CM

## 2012-09-08 DIAGNOSIS — O0990 Supervision of high risk pregnancy, unspecified, unspecified trimester: Secondary | ICD-10-CM

## 2012-09-08 DIAGNOSIS — J45909 Unspecified asthma, uncomplicated: Secondary | ICD-10-CM

## 2012-09-08 DIAGNOSIS — O24913 Unspecified diabetes mellitus in pregnancy, third trimester: Secondary | ICD-10-CM

## 2012-09-08 DIAGNOSIS — Z3490 Encounter for supervision of normal pregnancy, unspecified, unspecified trimester: Secondary | ICD-10-CM

## 2012-09-08 DIAGNOSIS — E119 Type 2 diabetes mellitus without complications: Secondary | ICD-10-CM

## 2012-09-08 LAB — POCT URINALYSIS DIP (DEVICE)
Hgb urine dipstick: NEGATIVE
Protein, ur: NEGATIVE mg/dL
Specific Gravity, Urine: 1.02 (ref 1.005–1.030)
Urobilinogen, UA: 0.2 mg/dL (ref 0.0–1.0)
pH: 6.5 (ref 5.0–8.0)

## 2012-09-08 NOTE — Progress Notes (Signed)
Patient doing well without any complaints. CBG f 88-140 (4/8 abnormal) 2hr pp 85-204 (11/23 abnormal). Will increase am to NPH 36 units and 24 units qHS. FM/PTL precautions reviewed. Will start twice weekly fetal testing at 32 weeks. Reviewed diet, portion control and snacking with patient. Patient's sister is concerned about possible depressive symptoms in the patient. Patient admits to h/o pp depression. Patient would like to speak with social worker today. Advised patient not to ignore these symptoms and it is better when actions are taken sooner rather than later. Patient verbalized understanding. 2/25 [place] EFW 81%tile

## 2012-09-08 NOTE — Patient Instructions (Signed)
Please Note the following changes to your insulin regimen Humulin N (NPH) 36 units in am and 24 units at bedtime

## 2012-09-08 NOTE — Progress Notes (Signed)
Pulse 113 Edema trace in feet when standing too long.

## 2012-09-22 ENCOUNTER — Encounter (HOSPITAL_COMMUNITY): Payer: Self-pay

## 2012-09-22 ENCOUNTER — Encounter: Payer: Medicaid Other | Admitting: Obstetrics & Gynecology

## 2012-09-22 ENCOUNTER — Inpatient Hospital Stay (HOSPITAL_COMMUNITY)
Admission: AD | Admit: 2012-09-22 | Discharge: 2012-09-22 | Disposition: A | Discharge status: Home / Self Care | Payer: Medicaid Other | Source: Ambulatory Visit | Attending: Obstetrics & Gynecology | Admitting: Obstetrics & Gynecology

## 2012-09-22 DIAGNOSIS — O24913 Unspecified diabetes mellitus in pregnancy, third trimester: Secondary | ICD-10-CM

## 2012-09-22 DIAGNOSIS — O0990 Supervision of high risk pregnancy, unspecified, unspecified trimester: Secondary | ICD-10-CM

## 2012-09-22 DIAGNOSIS — O99810 Abnormal glucose complicating pregnancy: Secondary | ICD-10-CM | POA: Insufficient documentation

## 2012-09-22 DIAGNOSIS — Z2839 Other underimmunization status: Secondary | ICD-10-CM

## 2012-09-22 DIAGNOSIS — O09293 Supervision of pregnancy with other poor reproductive or obstetric history, third trimester: Secondary | ICD-10-CM

## 2012-09-22 DIAGNOSIS — R55 Syncope and collapse: Secondary | ICD-10-CM

## 2012-09-22 DIAGNOSIS — O2650 Maternal hypotension syndrome, unspecified trimester: Secondary | ICD-10-CM | POA: Insufficient documentation

## 2012-09-22 LAB — COMPREHENSIVE METABOLIC PANEL
Albumin: 2.5 g/dL — ABNORMAL LOW (ref 3.5–5.2)
BUN: 5 mg/dL — ABNORMAL LOW (ref 6–23)
Calcium: 8.7 mg/dL (ref 8.4–10.5)
Creatinine, Ser: 0.61 mg/dL (ref 0.50–1.10)
GFR calc Af Amer: >90 mL/min (ref >90)
Glucose, Bld: 121 mg/dL — ABNORMAL HIGH (ref 70–99)
Total Protein: 6.8 g/dL (ref 6.0–8.3)

## 2012-09-22 LAB — URINALYSIS, ROUTINE W REFLEX MICROSCOPIC
Glucose, UA: 250 mg/dL — AB
Nitrite: NEGATIVE
pH: 6 (ref 5.0–8.0)

## 2012-09-22 LAB — URINE MICROSCOPIC-ADD ON

## 2012-09-22 LAB — CBC
HCT: 32.6 % — ABNORMAL LOW (ref 36.0–46.0)
MCH: 22.6 pg — ABNORMAL LOW (ref 26.0–34.0)
MCHC: 31.3 g/dL (ref 30.0–36.0)
MCV: 72.1 fL — ABNORMAL LOW (ref 78.0–100.0)
RDW: 15.2 % (ref 11.5–15.5)

## 2012-09-22 LAB — GLUCOSE, CAPILLARY: Glucose-Capillary: 122 mg/dL — ABNORMAL HIGH (ref 70–99)

## 2012-09-22 NOTE — MAU Note (Addendum)
3rd time in a month she has passed out.  Had clinic appt this morning, was told to come up here.  Was 351, when passed out on Sat. 0200 this morning was over 200 when passed out again.  poptart  @0500 - CBG now in triage is 127.  States I don't know why I am so dizzy still  When my sugar is good.

## 2012-09-22 NOTE — MAU Provider Note (Signed)
History     CSN: [id number]  Arrival date and time: 09/22/12 1231   None     Chief Complaint  Patient presents with  . Loss of Consciousness   HPI 27 y/o G3P1011 at 32.0 with Class B GDM here with repeated syncopal events. She says Saturday she stood up, had a room spinning sensation, and then fell into a pile of clothes. She "blacked out" for 3-5 miuntes and slept for 2 hours afterwards, it was alos accompanied by cold/clammy/shaky feeling (like her hypoglycemic episodes). This morning around 3 am she woke up and was feeling the same way, and the next thing she knew she was waking up again, so she is unsure of wether it occurred again. She states that her fasting CBGs are usually 120s on a good day and so her insulin dose has been adjusted lately. She had a hypoglycemic episode last night when her CBG was around 55, another at 1 am when it was 70. She ate a pop-tart and drank soda to recover from her early morning event and her fasting today was 97. She denies vurrent dizziness. She also denies vaginal LOF/bleeding/discharge/itching, dyspnea, chest pain, dysuria, and swelling. She is feeling her baby move as much as it used to but not as fast as it used to. She gets her care in [facility] and is compliant with her meds. She has had decreased PO intake due to nausea and migranies but takes her regular dose of pre-meal insulin even if what she eats is obviously not a meal.   OB History   Grav Para Term Preterm Abortions TAB SAB Ect Mult Living   3 1 1  0 1 1 0 0 0 1      Past Medical History  Diagnosis Date  . Asthma   . Hx of migraines   . Anemia   . Depression   . Fx ankle     history of left ankle fx at age 80  . Pregnancy induced hypertension   . Diabetes mellitus     type 1 on insulin  . Headache   . Urinary tract infection   . Chlamydia   . Abnormal Pap smear     colpo scheduled  . Anxiety     Past Surgical History  Procedure Laterality Date  . Wisdom tooth extraction    .  Colposcopy      Family History  Problem Relation Age of Onset  . Asthma Mother   . Diabetes Mother   . Hypertension Mother   . Asthma Father   . Anesthesia problems Neg Hx   . Hypertension Sister     History  Substance Use Topics  . Smoking status: Never Smoker   . Smokeless tobacco: Never Used  . Alcohol Use: No     Comment: occasional (once a month)- not (+) UPT    Allergies:  Allergies  Allergen Reactions  . Banana Anaphylaxis  . Citrus Other (See Comments)    Gums bleed  . Adhesive (Tape) Rash    Prescriptions prior to admission  Medication Sig Dispense Refill  . insulin NPH (HUMULIN N,NOVOLIN N) 100 UNIT/ML injection Inject 22-34 Units into the skin 2 (two) times daily. Inject 34 units sq q am, 22 units sq q hs      . insulin regular (NOVOLIN R,HUMULIN R) 100 units/mL injection Inject 20 Units into the skin 3 (three) times daily before meals. For Type II Diabetes      . Prenatal Vit-Fe Fumarate-FA (PRENATAL  MULTIVITAMIN) TABS Take 1 tablet by mouth every morning.      . promethazine (PHENERGAN) 25 MG tablet Take 25 mg by mouth every 6 (six) hours as needed for nausea.      . [DISCONTINUED] insulin regular (NOVOLIN R,HUMULIN R) 100 units/mL injection Inject 0.15 mLs (15 Units total) into the skin 3 (three) times daily before meals. For Type II Diabetes  10 mL  1  . albuterol (PROVENTIL HFA;VENTOLIN HFA) 108 (90 BASE) MCG/ACT inhaler Inhale 2 puffs into the lungs every 6 (six) hours as needed. For shortness of breath or wheezing  1 Inhaler  5  . glucose blood (ACCU-CHEK SMARTVIEW) test strip Use as instructed to check blood sugars  100 each  12    ROS per HPI  Physical Exam   Blood pressure 112/68, pulse 81, temperature 98.1 F (36.7 C), temperature source Oral, resp. rate 18, height 5\' 9"  (1.753 m), weight 98.884 kg (218 lb), last menstrual period 02/11/2012.  Physical Exam  Gen: NAD, alert, cooperative with exam HEENT: NCAT, EOMI, PERRL, MMM CV: RRR, good  S1/S2, no murmur Resp: CTABL, no wheezes, non-labored Abd: Soft, non tender pregnant abdomen Ext: No edema, warm Neuro: Alert and oriented, No gross deficits, strength 5/5 and sensation intact in all four extremities, normal gait  FHT: baseline 140, moderate variability, accels present, no decels Toco: occasional irregular contractions q5-10+ minutes apart  MAU Course  Procedures  Results for orders placed during the hospital encounter of 09/22/12 (from the past 24 hour(s))  GLUCOSE, CAPILLARY     Status: Abnormal   Collection Time    09/22/12  1:05 PM      Result Value Range   Glucose-Capillary 122 (*) 70 - 99 mg/dL  CBC     Status: Abnormal   Collection Time    09/22/12  2:45 PM      Result Value Range   WBC 6.2  4.0 - 10.5 K/uL   RBC 4.52  3.87 - 5.11 MIL/uL   Hemoglobin 10.2 (*) 12.0 - 15.0 g/dL   HCT 16.1 (*) 09.6 - 04.5 %   MCV 72.1 (*) 78.0 - 100.0 fL   MCH 22.6 (*) 26.0 - 34.0 pg   MCHC 31.3  30.0 - 36.0 g/dL   RDW 40.9  81.1 - 91.4 %   Platelets 160  150 - 400 K/uL  COMPREHENSIVE METABOLIC PANEL     Status: Abnormal   Collection Time    09/22/12  2:45 PM      Result Value Range   Sodium 135  135 - 145 mEq/L   Potassium 3.5  3.5 - 5.1 mEq/L   Chloride 101  96 - 112 mEq/L   CO2 24  19 - 32 mEq/L   Glucose, Bld 121 (*) 70 - 99 mg/dL   BUN 5 (*) 6 - 23 mg/dL   Creatinine, Ser 7.82  0.50 - 1.10 mg/dL   Calcium 8.7  8.4 - 95.6 mg/dL   Total Protein 6.8  6.0 - 8.3 g/dL   Albumin 2.5 (*) 3.5 - 5.2 g/dL   AST 26  0 - 37 U/L   ALT 18  0 - 35 U/L   Alkaline Phosphatase 93  39 - 117 U/L   Total Bilirubin 0.4  0.3 - 1.2 mg/dL   GFR calc non Af Amer >90  >90 mL/min   GFR calc Af Amer >90  >90 mL/min  URINALYSIS, ROUTINE W REFLEX MICROSCOPIC     Status: Abnormal  Collection Time    09/22/12  3:30 PM      Result Value Range   Color, Urine YELLOW  YELLOW   APPearance CLEAR  CLEAR   Specific Gravity, Urine 1.020  1.005 - 1.030   pH 6.0  5.0 - 8.0   Glucose, UA 250  (*) NEGATIVE mg/dL   Hgb urine dipstick NEGATIVE  NEGATIVE   Bilirubin Urine NEGATIVE  NEGATIVE   Ketones, ur NEGATIVE  NEGATIVE mg/dL   Protein, ur NEGATIVE  NEGATIVE mg/dL   Urobilinogen, UA 0.2  0.0 - 1.0 mg/dL   Nitrite NEGATIVE  NEGATIVE   Leukocytes, UA MODERATE (*) NEGATIVE  URINE MICROSCOPIC-ADD ON     Status: Abnormal   Collection Time    09/22/12  3:30 PM      Result Value Range   Squamous Epithelial / LPF MANY (*) RARE   WBC, UA 7-10  <3 WBC/hpf   RBC / HPF 3-6  <3 RBC/hpf   Bacteria, UA MANY (*) RARE    Assessment and Plan  27 y/o G3P1011 with class B GDM (type 1) here with repeated episodes of syncope - It appears that with her hypoglycemic episode last night and erratic PO intake this is most likely blood sugar related - Discussed more consistent PO intake and close monitoring of CBgs with the patient - EKG obtained without significant finding. CBC, CMP are also non-contributory - Still possible Cardiac source so case was discussed with cardiology on call and she will be called to sset up an OP cardiology consult - Physical exam without any neurologic deficits - DC home with routine prenatal care, cardiology appt, and precautions to return for hypoglycemia or continued episodes   Kevin Fenton 09/22/2012, 2:42 PM   I saw and examined patient and reviewed prenatal records, labs, imaging and fetal heart tracings. FHTs reactive, reassuring. I agree with above resident note.  I personally reviewed EKG which showed normal sinus rhythm, normal axis, normal intervals and no ST changes. Episodes seem to be related to blood sugars. Discussed with Dr. Penne Lash and called on-call cardiologist at [facility]. Patient will be referred to outpatient cardiology consult in next week. Pt missed [facility] appt today - needs to start antenatal testing this week. Note sent to clinic to schedule.  Napoleon Form, MD

## 2012-09-22 NOTE — MAU Note (Signed)
Patient is in with c/o syncope episode this morning. She states that her fasting BG was 97mg /dl. She denies feeling dizzy now. Patient have not eaten today. She states that she does not want to eat. Patient denies vaginal bleeding or lof. She c/o decreased fetal movement.

## 2012-09-23 ENCOUNTER — Encounter (HOSPITAL_COMMUNITY): Payer: Self-pay | Admitting: Obstetrics and Gynecology

## 2012-09-23 ENCOUNTER — Inpatient Hospital Stay (HOSPITAL_COMMUNITY)
Admission: AD | Admit: 2012-09-23 | Discharge: 2012-09-23 | Disposition: A | Discharge status: Home / Self Care | Payer: Medicaid Other | Source: Ambulatory Visit | Attending: Obstetrics & Gynecology | Admitting: Obstetrics & Gynecology

## 2012-09-23 DIAGNOSIS — Z794 Long term (current) use of insulin: Secondary | ICD-10-CM | POA: Insufficient documentation

## 2012-09-23 DIAGNOSIS — R51 Headache: Secondary | ICD-10-CM | POA: Insufficient documentation

## 2012-09-23 DIAGNOSIS — O212 Late vomiting of pregnancy: Secondary | ICD-10-CM | POA: Insufficient documentation

## 2012-09-23 DIAGNOSIS — O24913 Unspecified diabetes mellitus in pregnancy, third trimester: Secondary | ICD-10-CM

## 2012-09-23 DIAGNOSIS — O99891 Other specified diseases and conditions complicating pregnancy: Secondary | ICD-10-CM | POA: Insufficient documentation

## 2012-09-23 DIAGNOSIS — O24919 Unspecified diabetes mellitus in pregnancy, unspecified trimester: Secondary | ICD-10-CM | POA: Insufficient documentation

## 2012-09-23 DIAGNOSIS — E109 Type 1 diabetes mellitus without complications: Secondary | ICD-10-CM | POA: Insufficient documentation

## 2012-09-23 DIAGNOSIS — K5289 Other specified noninfective gastroenteritis and colitis: Secondary | ICD-10-CM | POA: Insufficient documentation

## 2012-09-23 LAB — URINALYSIS, ROUTINE W REFLEX MICROSCOPIC
Glucose, UA: >1000 mg/dL — AB
Ketones, ur: 15 mg/dL — AB
Nitrite: NEGATIVE
Protein, ur: NEGATIVE mg/dL

## 2012-09-23 LAB — URINE MICROSCOPIC-ADD ON

## 2012-09-23 LAB — URINE CULTURE: Colony Count: 9000

## 2012-09-23 MED ORDER — ONDANSETRON 4 MG PO TBDP: 4.0000 mg | ORAL_TABLET | Freq: Three times a day (TID) | ORAL | Status: DC | PRN

## 2012-09-23 MED ORDER — INSULIN NPH (HUMAN) (ISOPHANE) 100 UNIT/ML ~~LOC~~ SUSP
22.0000 [IU] | Freq: Once | SUBCUTANEOUS | Status: AC
  Administered 2012-09-23: 22 [IU] via SUBCUTANEOUS
  Filled 2012-09-23: qty 10

## 2012-09-23 MED ORDER — BUTALBITAL-APAP-CAFFEINE 50-325-40 MG PO TABS: 1.0000 | ORAL_TABLET | Freq: Four times a day (QID) | ORAL | Status: DC | PRN

## 2012-09-23 MED ORDER — ONDANSETRON 4 MG PO TBDP
4.0000 mg | ORAL_TABLET | Freq: Once | ORAL | Status: AC
  Administered 2012-09-23: 4 mg via ORAL
  Filled 2012-09-23: qty 1

## 2012-09-23 NOTE — MAU Note (Signed)
PT SAYS SHE WAS  HERE YESTERDAY FOR PASSING OUT.  THEN WENT  HOME AND STARTED VOMITING  AT 1100PM.  SHE WAS TOLD TO DRINK  FLUIDS.  LAST TIME VOMITED WAS   AT  AT 1030 AM.  DENIES HSV  AND MRSA.

## 2012-09-23 NOTE — MAU Provider Note (Signed)
History   27 y/o G3P1011 at [redacted]w[redacted]d w/ hx of DM-1 presenting with n/v for 1 day  CSN: [id number]  Arrival date and time: 09/23/12 2005   First Provider Initiated Contact with Patient 09/23/12 2128      Chief Complaint  Patient presents with  . Emesis During Pregnancy   Emesis  This is a new problem. The current episode started yesterday. The problem occurs 5 to 10 times per day (last episode at 0700 this am). The problem has been gradually improving. The emesis has an appearance of stomach contents. There has been no fever. The fever has been present for less than 1 day. Associated symptoms include headaches (not associated with currenty n/v). Pertinent negatives include no abdominal pain, arthralgias, chest pain, chills, coughing, diarrhea, dizziness, fever or myalgias. Treatments tried: phenegen without relief. The treatment provided no relief.    OB History   Grav Para Term Preterm Abortions TAB SAB Ect Mult Living   3 1 1  0 1 1 0 0 0 1      Past Medical History  Diagnosis Date  . Asthma   . Hx of migraines   . Anemia   . Depression   . Fx ankle     history of left ankle fx at age 40  . Pregnancy induced hypertension   . Diabetes mellitus     type 1 on insulin  . Headache   . Urinary tract infection   . Chlamydia   . Abnormal Pap smear     colpo scheduled  . Anxiety     Past Surgical History  Procedure Laterality Date  . Wisdom tooth extraction    . Colposcopy      Family History  Problem Relation Age of Onset  . Asthma Mother   . Diabetes Mother   . Hypertension Mother   . Asthma Father   . Anesthesia problems Neg Hx   . Hypertension Sister     History  Substance Use Topics  . Smoking status: Never Smoker   . Smokeless tobacco: Never Used  . Alcohol Use: No     Comment: occasional (once a month)- not (+) UPT    Allergies:  Allergies  Allergen Reactions  . Banana Anaphylaxis  . Citrus Other (See Comments)    Gums bleed  . Adhesive (Tape) Rash     Prescriptions prior to admission  Medication Sig Dispense Refill  . glucose blood (ACCU-CHEK SMARTVIEW) test strip Use as instructed to check blood sugars  100 each  12  . insulin NPH (HUMULIN N,NOVOLIN N) 100 UNIT/ML injection Inject 22-34 Units into the skin 2 (two) times daily. Inject 34 units sq q am, 22 units sq q hs      . insulin regular (NOVOLIN R,HUMULIN R) 100 units/mL injection Inject 20 Units into the skin 3 (three) times daily before meals. For Type II Diabetes      . Prenatal Vit-Fe Fumarate-FA (PRENATAL MULTIVITAMIN) TABS Take 1 tablet by mouth every morning.      . promethazine (PHENERGAN) 25 MG tablet Take 25 mg by mouth every 6 (six) hours as needed for nausea.      . [DISCONTINUED] albuterol (PROVENTIL HFA;VENTOLIN HFA) 108 (90 BASE) MCG/ACT inhaler Inhale 2 puffs into the lungs every 6 (six) hours as needed. For shortness of breath or wheezing  1 Inhaler  5    Review of Systems  Constitutional: Negative for fever and chills.  HENT: Negative for ear pain.   Eyes: Positive for  blurred vision (pt attributes to DM-1).  Respiratory: Negative for cough and wheezing.   Cardiovascular: Negative for chest pain, palpitations and leg swelling.  Gastrointestinal: Positive for nausea and vomiting. Negative for abdominal pain, diarrhea, constipation and blood in stool.  Genitourinary: Negative for dysuria and urgency.  Musculoskeletal: Negative for myalgias, joint pain and arthralgias.  Neurological: Positive for headaches (not associated with currenty n/v). Negative for dizziness.  Endo/Heme/Allergies: Negative for environmental allergies. Does not bruise/bleed easily.  All other systems reviewed and are negative.   Physical Exam   Blood pressure 108/68, pulse 119, temperature 98.4 F (36.9 C), temperature source Oral, resp. rate 20, height 5\' 9"  (1.753 m), weight 96.843 kg (213 lb 8 oz), last menstrual period 02/11/2012.  Physical Exam  Constitutional: She is oriented to  person, place, and time. She appears well-developed and well-nourished.  HENT:  Head: Normocephalic and atraumatic.  Eyes: EOM are normal. Pupils are equal, round, and reactive to light.  Neck: Normal range of motion. Neck supple.  Cardiovascular: Normal rate, regular rhythm, normal heart sounds and intact distal pulses.  Exam reveals no gallop and no friction rub.   No murmur heard. Respiratory: Effort normal and breath sounds normal. No respiratory distress. She has no wheezes. She has no rales.  GI: Soft. Bowel sounds are normal. There is tenderness (RLQ) in the right lower quadrant. There is rebound (RLQ). There is no guarding and no CVA tenderness.  Negative obturator and psoas sign  Musculoskeletal: Normal range of motion. She exhibits no edema and no tenderness.  Neurological: She is alert and oriented to person, place, and time.  Skin: Skin is warm and dry.  Psychiatric: She has a normal mood and affect. Her behavior is normal. Thought content normal.   Results for orders placed during the hospital encounter of 09/23/12 (from the past 24 hour(s))  URINALYSIS, ROUTINE W REFLEX MICROSCOPIC     Status: Abnormal   Collection Time    09/23/12  8:18 PM      Result Value Range   Color, Urine YELLOW  YELLOW   APPearance CLEAR  CLEAR   Specific Gravity, Urine 1.020  1.005 - 1.030   pH 6.0  5.0 - 8.0   Glucose, UA >1000 (*) NEGATIVE mg/dL   Hgb urine dipstick NEGATIVE  NEGATIVE   Bilirubin Urine NEGATIVE  NEGATIVE   Ketones, ur 15 (*) NEGATIVE mg/dL   Protein, ur NEGATIVE  NEGATIVE mg/dL   Urobilinogen, UA 2.0 (*) 0.0 - 1.0 mg/dL   Nitrite NEGATIVE  NEGATIVE   Leukocytes, UA TRACE (*) NEGATIVE  URINE MICROSCOPIC-ADD ON     Status: None   Collection Time    09/23/12  8:18 PM      Result Value Range   Squamous Epithelial / LPF RARE  RARE   WBC, UA 0-2  <3 WBC/hpf   RBC / HPF 0-2  <3 RBC/hpf   Bacteria, UA RARE  RARE  GLUCOSE, CAPILLARY     Status: Abnormal   Collection Time     09/23/12  9:10 PM      Result Value Range   Glucose-Capillary 167 (*) 70 - 99 mg/dL     MAU Course  Procedures  MDM   Assessment and Plan  26 y/o G3P1011 at [redacted]w[redacted]d with hx of Type 1 DM presenting with one day of n/v  # Gastroenteritis: Pt with one day of n/v of multiple non bloody/ biliary emesis without fever, loss of appetite.  Differential: Appendicitis: pt denies abdominal  pain, fever, decreased appetite.  UTI: trace leukocytes on UA, no nitrites  -- improved with zofran 4mg  sublinqual  -- pt tolerating po challenge -- d/c w/ zofran   # DM-1: Pt reports not taking insulin 2/2 absent PO intake.  15 ketones and CBG of 167.  Differential: DKA pt denies abdominal pain, increased thirst, small ketones on urine w/o systemic signs of DKA -- Novalin N 22 units sq prior to d/c -- continue home insulin regime upon d/c to home  # Headache: Pt mentions reoccurring headaches with minimal improvement with Tylenol. -- Fioroct upon d/c to home  Gregor Hams 09/23/2012, 10:16 PM   I have seen and examined this patient and I agree with the above. FHR 150s + accels, no decels. No ctx per Coral Ceo, KIMBERLY 11:42 PM 09/23/2012

## 2012-09-23 NOTE — MAU Note (Signed)
Pt presents to MAU with chief complaint of vomiting. She was here yesterday and discharged home. Shortly after she was discharged she began vomiting around 10:30Pm. She last vomited this morning at 7:30. Since then she has been nauseated and is afraid to drink. She tried phenergan twice, however vomited them both up. She is asking for something to drink at this time.

## 2012-09-24 NOTE — MAU Provider Note (Signed)
Attestation of Attending Supervision of Advanced Practitioner (PA/CNM/NP): Evaluation and management procedures were performed by the Advanced Practitioner under my supervision and collaboration.  I have reviewed the Advanced Practitioner's note and chart, and I agree with the management and plan.  Carnell Beavers, MD, FACOG Attending Obstetrician & Gynecologist Faculty Practice, [facility]  

## 2012-09-25 ENCOUNTER — Other Ambulatory Visit: Payer: Self-pay | Admitting: Obstetrics & Gynecology

## 2012-09-25 ENCOUNTER — Encounter: Payer: Self-pay | Admitting: Cardiology

## 2012-09-25 ENCOUNTER — Ambulatory Visit (INDEPENDENT_AMBULATORY_CARE_PROVIDER_SITE_OTHER): Payer: Medicaid Other | Admitting: Cardiology

## 2012-09-25 VITALS — BP 112/68 | HR 112 | Ht 69.0 in | Wt 213.0 lb

## 2012-09-25 DIAGNOSIS — R55 Syncope and collapse: Secondary | ICD-10-CM | POA: Insufficient documentation

## 2012-09-25 NOTE — MAU Provider Note (Signed)
Attestation of Attending Supervision of Advanced Practitioner (CNM/NP): Evaluation and management procedures were performed by the Advanced Practitioner under my supervision and collaboration. I have reviewed the Advanced Practitioner's note and chart, and I agree with the management and plan.  Shevy Yaney H. 3:22 PM   

## 2012-09-25 NOTE — Progress Notes (Signed)
HPI Christie [name] is referred today by Dr Tinnie Gens for the evaluation of syncope.  She is currently [redacted] weeks pregnant with her second child. She is diabetic. She admits to drinking a fair amount of caffeine.  Her syncopal events sound orthostatic. She stands up she gets a lightheaded and dizzy. She denies any chest pain or palpitations.  Recent blood work showed her to be mildly anemic. EKG was unremarkable.  Past Medical History  Diagnosis Date  . Asthma   . Hx of migraines   . Anemia   . Depression   . Fx ankle     history of left ankle fx at age 38  . Pregnancy induced hypertension   . Diabetes mellitus     type 1 on insulin  . Headache   . Urinary tract infection   . Chlamydia   . Abnormal Pap smear     colpo scheduled  . Anxiety     Current Outpatient Prescriptions  Medication Sig Dispense Refill  . butalbital-acetaminophen-caffeine (FIORICET) 50-325-40 MG per tablet Take 1-2 tablets by mouth every 6 (six) hours as needed for headache.  20 tablet  0  . glucose blood (ACCU-CHEK SMARTVIEW) test strip Use as instructed to check blood sugars  100 each  12  . insulin NPH (HUMULIN N,NOVOLIN N) 100 UNIT/ML injection Inject 22-34 Units into the skin 2 (two) times daily. Inject 34 units sq q am, 22 units sq q hs      . insulin regular (NOVOLIN R,HUMULIN R) 100 units/mL injection Inject 20 Units into the skin 3 (three) times daily before meals. For Type II Diabetes      . ondansetron (ZOFRAN ODT) 4 MG disintegrating tablet Take 1 tablet (4 mg total) by mouth every 8 (eight) hours as needed for nausea.  20 tablet  0  . Prenatal Vit-Fe Fumarate-FA (PRENATAL MULTIVITAMIN) TABS Take 1 tablet by mouth every morning.       No current facility-administered medications for this visit.    Allergies  Allergen Reactions  . Banana Anaphylaxis  . Citrus Other (See Comments)    Gums bleed  . Adhesive (Tape) Rash    Family History  Problem Relation Age of Onset  . Asthma Mother   .  Diabetes Mother   . Hypertension Mother   . Asthma Father   . Anesthesia problems Neg Hx   . Hypertension Sister     History   Social History  . Marital Status: Single    Spouse Name: N/A    Number of Children: N/A  . Years of Education: N/A   Occupational History  . Not on file.   Social History Main Topics  . Smoking status: Never Smoker   . Smokeless tobacco: Never Used  . Alcohol Use: No     Comment: occasional (once a month)- not (+) UPT  . Drug Use: Yes    Special: Marijuana     Comment: last used 1 years ago  . Sexually Active: Yes    Birth Control/ Protection: None   Other Topics Concern  . Not on file   Social History Narrative  . No narrative on file    ROS ALL NEGATIVE EXCEPT THOSE NOTED IN HPI  PE Markedly orthostatic with a heart rate increased from 85 to 115 with standing General Appearance: well developed, well nourished in no acute distress, obese and right HEENT: symmetrical face, PERRLA, good dentition  Neck: no JVD, thyromegaly, or adenopathy, trachea midline Chest: symmetric without deformity  Cardiac: PMI non-displaced, RRR, normal S1, S2, no gallop or murmur Lung: clear to ausculation and percussion Vascular: all pulses full without bruits  Abdominal: nondistended, nontender, good bowel sounds, no HSM, no bruits Extremities: no cyanosis, clubbing or edema, no sign of DVT, no varicosities  Skin: normal color, no rashes Neuro: alert and oriented x 3, non-focal Pysch: normal affect  EKG Normal sinus rhythm, normal PR, QRS, QTC intervals.  BMET    Component Value Date/Time   NA 135 09/22/2012 1445   K 3.5 09/22/2012 1445   CL 101 09/22/2012 1445   CO2 24 09/22/2012 1445   GLUCOSE 121* 09/22/2012 1445   BUN 5* 09/22/2012 1445   CREATININE 0.61 09/22/2012 1445   CREATININE 0.73 04/11/2012 1154   CREATININE 0.73 04/11/2012 1146   CALCIUM 8.7 09/22/2012 1445   GFRNONAA >90 09/22/2012 1445   GFRAA >90 09/22/2012 1445    Lipid Panel  No results  found for this basename: chol, trig, hdl, cholhdl, vldl, ldlcalc    CBC    Component Value Date/Time   WBC 6.2 09/22/2012 1445   RBC 4.52 09/22/2012 1445   HGB 10.2* 09/22/2012 1445   HCT 32.6* 09/22/2012 1445   PLT 160 09/22/2012 1445   MCV 72.1* 09/22/2012 1445   MCH 22.6* 09/22/2012 1445   MCHC 31.3 09/22/2012 1445   RDW 15.2 09/22/2012 1445   LYMPHSABS 2.4 06/15/2012 1631   MONOABS 0.8 06/15/2012 1631   EOSABS 0.1 06/15/2012 1631   BASOSABS 0.0 06/15/2012 1631

## 2012-09-25 NOTE — Patient Instructions (Addendum)
Your physician recommends that you continue on your current medications as directed. Please refer to the Current Medication list given to you today.   Remember to drink plenty of fluids especially water. Stay well hydrated.  Do not lay on your right side if at all possible.  Take your time when getting up from a chair or from a lying position

## 2012-09-25 NOTE — Assessment & Plan Note (Signed)
Cause of her syncope is orthostatic hypotension with a markedly increase in heart rate with standing. 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  I've encouraged her to eat more protein with her albumin being low. I've encouraged her to drink water and less sodas. I've asked her to avoid caffeine. We have gone over orthostatic precautions as well. She will try to sleep on her left side. We will see her back when necessary.

## 2012-09-26 ENCOUNTER — Ambulatory Visit (INDEPENDENT_AMBULATORY_CARE_PROVIDER_SITE_OTHER): Payer: Medicaid Other | Admitting: *Deleted

## 2012-09-26 DIAGNOSIS — O24919 Unspecified diabetes mellitus in pregnancy, unspecified trimester: Secondary | ICD-10-CM

## 2012-09-26 DIAGNOSIS — O24913 Unspecified diabetes mellitus in pregnancy, third trimester: Secondary | ICD-10-CM

## 2012-09-27 NOTE — Progress Notes (Signed)
NST 09/26/12 reactive

## 2012-09-29 ENCOUNTER — Encounter: Payer: Medicaid Other | Admitting: Family Medicine

## 2012-09-29 ENCOUNTER — Ambulatory Visit (INDEPENDENT_AMBULATORY_CARE_PROVIDER_SITE_OTHER): Payer: Medicaid Other | Admitting: Family Medicine

## 2012-09-29 VITALS — BP 99/66 | Temp 98.1°F

## 2012-09-29 DIAGNOSIS — O24919 Unspecified diabetes mellitus in pregnancy, unspecified trimester: Secondary | ICD-10-CM

## 2012-09-29 DIAGNOSIS — O24913 Unspecified diabetes mellitus in pregnancy, third trimester: Secondary | ICD-10-CM

## 2012-09-29 LAB — POCT URINALYSIS DIP (DEVICE)
Bilirubin Urine: NEGATIVE
Nitrite: NEGATIVE
pH: 6.5 (ref 5.0–8.0)

## 2012-09-29 MED ORDER — INSULIN NPH (HUMAN) (ISOPHANE) 100 UNIT/ML ~~LOC~~ SUSP: 26.0000 [IU] | Freq: Two times a day (BID) | SUBCUTANEOUS | Status: DC

## 2012-09-29 MED ORDER — INSULIN REGULAR HUMAN 100 UNIT/ML IJ SOLN: 20.0000 [IU] | Freq: Three times a day (TID) | INTRAMUSCULAR | Status: DC

## 2012-09-29 NOTE — Patient Instructions (Signed)
Gestational Diabetes Mellitus Gestational diabetes mellitus (GDM) is diabetes that occurs only during pregnancy. This happens when the body cannot properly handle the glucose (sugar) that increases in the blood after eating. During pregnancy, insulin resistance (reduced sensitivity to insulin) occurs because of the release of hormones from the placenta. Usually, the pancreas of pregnant women produces enough insulin to overcome the resistance that occurs. However, in gestational diabetes, the insulin is there but it does not work effectively. If the resistance is severe enough that the pancreas does not produce enough insulin, extra glucose builds up in the blood.  WHO IS AT RISK FOR DEVELOPING GESTATIONAL DIABETES?  Women with a history of diabetes in the family.  Women over age 25.  Women who are overweight.  Women in certain ethnic groups (Hispanic, African American, Native American, Asian and Pacific Islander). WHAT CAN HAPPEN TO THE BABY? If the mother's blood glucose is too high while she is pregnant, the extra sugar will travel through the umbilical cord to the baby. Some of the problems the baby may have are:  Large Baby - If the baby receives too much sugar, the baby will gain more weight. This may cause the baby to be too large to be born normally (vaginally) and a Cesarean section (C-section) may be needed.  Low Blood Glucose (hypoglycemia)  The baby makes extra insulin, in response to the extra sugar its gets from its mother. When the baby is born and no longer needs this extra insulin, the baby's blood glucose level may drop.  Jaundice (yellow coloring of the skin and eyes)  This is fairly common in babies. It is caused from a build-up of the chemical called bilirubin. This is rarely serious, but is seen more often in babies whose mothers had gestational diabetes. RISKS TO THE MOTHER Women who have had gestational diabetes may be at higher risk for some problems,  including:  Preeclampsia or toxemia, which includes problems with high blood pressure. Blood pressure and protein levels in the urine must be checked frequently.  Infections.  Cesarean section (C-section) for delivery.  Developing Type 2 diabetes later in life. About 30-50% will develop diabetes later, especially if obese. DIAGNOSIS  The hormones that cause insulin resistance are highest at about 24-28 weeks of pregnancy. If symptoms are experienced, they are much like symptoms you would normally expect during pregnancy.  GDM is often diagnosed using a two part method: 1. After 24-28 weeks of pregnancy, the woman drinks a glucose solution and takes a blood test. If the glucose level is high, a second test will be given. 2. Oral Glucose Tolerance Test (OGTT) which is 3 hours long  After not eating overnight, the blood glucose is checked. The woman drinks a glucose solution, and hourly blood glucose tests are taken. If the woman has risk factors for GDM, the caregiver may test earlier than 24 weeks of pregnancy. TREATMENT  Treatment of GDM is directed at keeping the mother's blood glucose level normal, and may include:  Meal planning.  Taking insulin or other medicine to control your blood glucose level.  Exercise.  Keeping a daily record of the foods you eat.  Blood glucose monitoring and keeping a record of your blood glucose levels.  May monitor ketone levels in the urine, although this is no longer considered necessary in most pregnancies. HOME CARE INSTRUCTIONS  While you are pregnant:  Follow your caregiver's advice regarding your prenatal appointments, meal planning, exercise, medicines, vitamins, blood and other tests, and physical   activities.  Keep a record of your meals, blood glucose tests, and the amount of insulin you are taking (if any). Show this to your caregiver at every prenatal visit.  If you have GDM, you may have problems with hypoglycemia (low blood glucose).  You may suspect this if you become suddenly dizzy, feel shaky, and/or weak. If you think this is happening and you have a glucose meter, try to test your blood glucose level. Follow your caregiver's advice for when and how to treat your low blood glucose. Generally, the 15:15 rule is followed: Treat by consuming 15 grams of carbohydrates, wait 15 minutes, and recheck blood glucose. Examples of 15 grams of carbohydrates are:  1 cup skim or low-fat milk.   cup juice.  3-4 glucose tablets.  5-6 hard candies.  1 small box raisins.   cup regular soda pop.  Practice good hygiene, to avoid infections.  Do not smoke. SEEK MEDICAL CARE IF:   You develop abnormal vaginal discharge, with or without itching.  You become weak and tired more than expected.  You seem to sweat a lot.  You have a sudden increase in weight, 5 pounds or more in one week.  You are losing weight, 3 pounds or more in a week.  Your blood glucose level is high, and you need instructions on what to do about it. SEEK IMMEDIATE MEDICAL CARE IF:   You develop a severe headache.  You faint or pass out.  You develop nausea and vomiting.  You become disoriented or confused.  You have a convulsion.  You develop vision problems.  You develop stomach pain.  You develop vaginal bleeding.  You develop uterine contractions.  You have leaking or a gush of fluid from the vagina. AFTER YOU HAVE THE BABY:  Go to all of your follow-up appointments, and have blood tests as advised by your caregiver.  Maintain a healthy lifestyle, to prevent diabetes in the future. This includes:  Following a healthy meal plan.  Controlling your weight.  Getting enough exercise and proper rest.  Do not smoke.  Breastfeed your baby if you can. This will lower the chance of you and your baby developing diabetes later in life. For more information about diabetes, go to the American Diabetes Association at:  www.americandiabetesassociation.org. For more information about gestational diabetes, go to the American Congress of Obstetricians and Gynecologists at: www.acog.org. Document Released: 10/01/2000 Document Revised: 09/17/2011 Document Reviewed: 04/25/2009 [employer] Patient Information 2013 [employer].  Pregnancy - Third Trimester The third trimester of pregnancy (the last 3 months) is a period of the most rapid growth for you and your baby. The baby approaches a length of 20 inches and a weight of 6 to 10 pounds. The baby is adding on fat and getting ready for life outside your body. While inside, babies have periods of sleeping and waking, suck their thumbs, and hiccups. You can often feel small contractions of the uterus. This is false labor. It is also called Braxton-Hicks contractions. This is like a practice for labor. The usual problems in this stage of pregnancy include more difficulty breathing, swelling of the hands and feet from water retention, and having to urinate more often because of the uterus and baby pressing on your bladder.  PRENATAL EXAMS  Blood work may continue to be done during prenatal exams. These tests are done to check on your health and the probable health of your baby. Blood work is used to follow your blood levels (hemoglobin). Anemia (  low hemoglobin) is common during pregnancy. Iron and vitamins are given to help prevent this. You may also continue to be checked for diabetes. Some of the past blood tests may be done again.  The size of the uterus is measured during each visit. This makes sure your baby is growing properly according to your pregnancy dates.  Your blood pressure is checked every prenatal visit. This is to make sure you are not getting toxemia.  Your urine is checked every prenatal visit for infection, diabetes and protein.  Your weight is checked at each visit. This is done to make sure gains are happening at the suggested rate and that you and your  baby are growing normally.  Sometimes, an ultrasound is performed to confirm the position and the proper growth and development of the baby. This is a test done that bounces harmless sound waves off the baby so your caregiver can more accurately determine due dates.  Discuss the type of pain medication and anesthesia you will have during your labor and delivery.  Discuss the possibility and anesthesia if a Cesarean Section might be necessary.  Inform your caregiver if there is any mental or physical violence at home. Sometimes, a specialized non-stress test, contraction stress test and biophysical profile are done to make sure the baby is not having a problem. Checking the amniotic fluid surrounding the baby is called an amniocentesis. The amniotic fluid is removed by sticking a needle into the belly (abdomen). This is sometimes done near the end of pregnancy if an early delivery is required. In this case, it is done to help make sure the baby's lungs are mature enough for the baby to live outside of the womb. If the lungs are not mature and it is unsafe to deliver the baby, an injection of cortisone medication is given to the mother 1 to 2 days before the delivery. This helps the baby's lungs mature and makes it safer to deliver the baby. CHANGES OCCURING IN THE THIRD TRIMESTER OF PREGNANCY Your body goes through many changes during pregnancy. They vary from person to person. Talk to your caregiver about changes you notice and are concerned about.  During the last trimester, you have probably had an increase in your appetite. It is normal to have cravings for certain foods. This varies from person to person and pregnancy to pregnancy.  You may begin to get stretch marks on your hips, abdomen, and breasts. These are normal changes in the body during pregnancy. There are no exercises or medications to take which prevent this change.  Constipation may be treated with a stool softener or adding bulk to  your diet. Drinking lots of fluids, fiber in vegetables, fruits, and whole grains are helpful.  Exercising is also helpful. If you have been very active up until your pregnancy, most of these activities can be continued during your pregnancy. If you have been less active, it is helpful to start an exercise program such as walking. Consult your caregiver before starting exercise programs.  Avoid all smoking, alcohol, un-prescribed drugs, herbs and "street drugs" during your pregnancy. These chemicals affect the formation and growth of the baby. Avoid chemicals throughout the pregnancy to ensure the delivery of a healthy infant.  Backache, varicose veins and hemorrhoids may develop or get worse.  You will tire more easily in the third trimester, which is normal.  The baby's movements may be stronger and more often.  You may become short of breath easily.  Your belly   button may stick out.  A yellow discharge may leak from your breasts called colostrum.  You may have a bloody mucus discharge. This usually occurs a few days to a week before labor begins. HOME CARE INSTRUCTIONS   Keep your caregiver's appointments. Follow your caregiver's instructions regarding medication use, exercise, and diet.  During pregnancy, you are providing food for you and your baby. Continue to eat regular, well-balanced meals. Choose foods such as meat, fish, milk and other low fat dairy products, vegetables, fruits, and whole-grain breads and cereals. Your caregiver will tell you of the ideal weight gain.  A physical sexual relationship may be continued throughout pregnancy if there are no other problems such as early (premature) leaking of amniotic fluid from the membranes, vaginal bleeding, or belly (abdominal) pain.  Exercise regularly if there are no restrictions. Check with your caregiver if you are unsure of the safety of your exercises. Greater weight gain will occur in the last 2 trimesters of pregnancy.  Exercising helps:  Control your weight.  Get you in shape for labor and delivery.  You lose weight after you deliver.  Rest a lot with legs elevated, or as needed for leg cramps or low back pain.  Wear a good support or jogging bra for breast tenderness during pregnancy. This may help if worn during sleep. Pads or tissues may be used in the bra if you are leaking colostrum.  Do not use hot tubs, steam rooms, or saunas.  Wear your seat belt when driving. This protects you and your baby if you are in an accident.  Avoid raw meat, cat litter boxes and soil used by cats. These carry germs that can cause birth defects in the baby.  It is easier to loose urine during pregnancy. Tightening up and strengthening the pelvic muscles will help with this problem. You can practice stopping your urination while you are going to the bathroom. These are the same muscles you need to strengthen. It is also the muscles you would use if you were trying to stop from passing gas. You can practice tightening these muscles up 10 times a set and repeating this about 3 times per day. Once you know what muscles to tighten up, do not perform these exercises during urination. It is more likely to cause an infection by backing up the urine.  Ask for help if you have financial, counseling or nutritional needs during pregnancy. Your caregiver will be able to offer counseling for these needs as well as refer you for other special needs.  Make a list of emergency phone numbers and have them available.  Plan on getting help from family or friends when you go home from the hospital.  Make a trial run to the hospital.  Take prenatal classes with the father to understand, practice and ask questions about the labor and delivery.  Prepare the baby's room/nursery.  Do not travel out of the city unless it is absolutely necessary and with the advice of your caregiver.  Wear only low or no heal shoes to have better balance and  prevent falling. MEDICATIONS AND DRUG USE IN PREGNANCY  Take prenatal vitamins as directed. The vitamin should contain 1 milligram of folic acid. Keep all vitamins out of reach of children. Only a couple vitamins or tablets containing iron may be fatal to a baby or young child when ingested.  Avoid use of all medications, including herbs, over-the-counter medications, not prescribed or suggested by your caregiver. Only take over-the-counter or   prescription medicines for pain, discomfort, or fever as directed by your caregiver. Do not use aspirin, ibuprofen (Motrin, Advil, Nuprin) or naproxen (Aleve) unless OK'd by your caregiver.  Let your caregiver also know about herbs you may be using.  Alcohol is related to a number of birth defects. This includes fetal alcohol syndrome. All alcohol, in any form, should be avoided completely. Smoking will cause low birth rate and premature babies.  Street/illegal drugs are very harmful to the baby. They are absolutely forbidden. A baby born to an addicted mother will be addicted at birth. The baby will go through the same withdrawal an adult does. SEEK MEDICAL CARE IF: You have any concerns or worries during your pregnancy. It is better to call with your questions if you feel they cannot wait, rather than worry about them. DECISIONS ABOUT CIRCUMCISION You may or may not know the sex of your baby. If you know your baby is a boy, it may be time to think about circumcision. Circumcision is the removal of the foreskin of the penis. This is the skin that covers the sensitive end of the penis. There is no proven medical need for this. Often this decision is made on what is popular at the time or based upon religious beliefs and social issues. You can discuss these issues with your caregiver or pediatrician. SEEK IMMEDIATE MEDICAL CARE IF:   An unexplained oral temperature above 102 F (38.9 C) develops, or as your caregiver suggests.  You have leaking of fluid  from the vagina (birth canal). If leaking membranes are suspected, take your temperature and tell your caregiver of this when you call.  There is vaginal spotting, bleeding or passing clots. Tell your caregiver of the amount and how many pads are used.  You develop a bad smelling vaginal discharge with a change in the color from clear to white.  You develop vomiting that lasts more than 24 hours.  You develop chills or fever.  You develop shortness of breath.  You develop burning on urination.  You loose more than 2 pounds of weight or gain more than 2 pounds of weight or as suggested by your caregiver.  You notice sudden swelling of your face, hands, and feet or legs.  You develop belly (abdominal) pain. Round ligament discomfort is a common non-cancerous (benign) cause of abdominal pain in pregnancy. Your caregiver still must evaluate you.  You develop a severe headache that does not go away.  You develop visual problems, blurred or double vision.  If you have not felt your baby move for more than 1 hour. If you think the baby is not moving as much as usual, eat something with sugar in it and lie down on your left side for an hour. The baby should move at least 4 to 5 times per hour. Call right away if your baby moves less than that.  You fall, are in a car accident or any kind of trauma.  There is mental or physical violence at home. Document Released: 06/19/2001 Document Revised: 09/17/2011 Document Reviewed: 12/22/2008 [employer] Patient Information 2013 [employer].  Contraception Choices Contraception (birth control) is the use of any methods or devices to prevent pregnancy. Below are some methods to help avoid pregnancy. HORMONAL METHODS   Contraceptive implant. This is a thin, plastic tube containing progesterone hormone. It does not contain estrogen hormone. Your caregiver inserts the tube in the inner part of the upper arm. The tube can remain in place for up to   3  years. After 3 years, the implant must be removed. The implant prevents the ovaries from releasing an egg (ovulation), thickens the cervical mucus which prevents sperm from entering the uterus, and thins the lining of the inside of the uterus.  Progesterone-only injections. These injections are given every 3 months by your caregiver to prevent pregnancy. This synthetic progesterone hormone stops the ovaries from releasing eggs. It also thickens cervical mucus and changes the uterine lining. This makes it harder for sperm to survive in the uterus.  Birth control pills. These pills contain estrogen and progesterone hormone. They work by stopping the egg from forming in the ovary (ovulation). Birth control pills are prescribed by a caregiver.Birth control pills can also be used to treat heavy periods.  Minipill. This type of birth control pill contains only the progesterone hormone. They are taken every day of each month and must be prescribed by your caregiver.  Birth control patch. The patch contains hormones similar to those in birth control pills. It must be changed once a week and is prescribed by a caregiver.  Vaginal ring. The ring contains hormones similar to those in birth control pills. It is left in the vagina for 3 weeks, removed for 1 week, and then a new one is put back in place. The patient must be comfortable inserting and removing the ring from the vagina.A caregiver's prescription is necessary.  Emergency contraception. Emergency contraceptives prevent pregnancy after unprotected sexual intercourse. This pill can be taken right after sex or up to 5 days after unprotected sex. It is most effective the sooner you take the pills after having sexual intercourse. Emergency contraceptive pills are available without a prescription. Check with your pharmacist. Do not use emergency contraception as your only form of birth control. BARRIER METHODS   Female condom. This is a thin sheath (latex or  rubber) that is worn over the penis during sexual intercourse. It can be used with spermicide to increase effectiveness.  Female condom. This is a soft, loose-fitting sheath that is put into the vagina before sexual intercourse.  Diaphragm. This is a soft, latex, dome-shaped barrier that must be fitted by a caregiver. It is inserted into the vagina, along with a spermicidal jelly. It is inserted before intercourse. The diaphragm should be left in the vagina for 6 to 8 hours after intercourse.  Cervical cap. This is a round, soft, latex or plastic cup that fits over the cervix and must be fitted by a caregiver. The cap can be left in place for up to 48 hours after intercourse.  Sponge. This is a soft, circular piece of polyurethane foam. The sponge has spermicide in it. It is inserted into the vagina after wetting it and before sexual intercourse.  Spermicides. These are chemicals that kill or block sperm from entering the cervix and uterus. They come in the form of creams, jellies, suppositories, foam, or tablets. They do not require a prescription. They are inserted into the vagina with an applicator before having sexual intercourse. The process must be repeated every time you have sexual intercourse. INTRAUTERINE CONTRACEPTION  Intrauterine device (IUD). This is a T-shaped device that is put in a woman's uterus during a menstrual period to prevent pregnancy. There are 2 types:  Copper IUD. This type of IUD is wrapped in copper wire and is placed inside the uterus. Copper makes the uterus and fallopian tubes produce a fluid that kills sperm. It can stay in place for 10 years.  Hormone IUD.   This type of IUD contains the hormone progestin (synthetic progesterone). The hormone thickens the cervical mucus and prevents sperm from entering the uterus, and it also thins the uterine lining to prevent implantation of a fertilized egg. The hormone can weaken or kill the sperm that get into the uterus. It can  stay in place for 5 years. PERMANENT METHODS OF CONTRACEPTION  Female tubal ligation. This is when the woman's fallopian tubes are surgically sealed, tied, or blocked to prevent the egg from traveling to the uterus.  Female sterilization. This is when the female has the tubes that carry sperm tied off (vasectomy).This blocks sperm from entering the vagina during sexual intercourse. After the procedure, the man can still ejaculate fluid (semen). NATURAL PLANNING METHODS  Natural family planning. This is not having sexual intercourse or using a barrier method (condom, diaphragm, cervical cap) on days the woman could become pregnant.  Calendar method. This is keeping track of the length of each menstrual cycle and identifying when you are fertile.  Ovulation method. This is avoiding sexual intercourse during ovulation.  Symptothermal method. This is avoiding sexual intercourse during ovulation, using a thermometer and ovulation symptoms.  Post-ovulation method. This is timing sexual intercourse after you have ovulated. Regardless of which type or method of contraception you choose, it is important that you use condoms to protect against the transmission of sexually transmitted diseases (STDs). Talk with your caregiver about which form of contraception is most appropriate for you. Document Released: 06/25/2005 Document Revised: 09/17/2011 Document Reviewed: 11/01/2010 [employer] Patient Information 2013 [employer].  Breastfeeding Deciding to breastfeed is one of the best choices you can make for you and your baby. The information that follows gives a brief overview of the benefits of breastfeeding as well as common topics surrounding breastfeeding. BENEFITS OF BREASTFEEDING For the baby  The first milk (colostrum) helps the baby's digestive system function better.   There are antibodies in the mother's milk that help the baby fight off infections.   The baby has a lower incidence of  asthma, allergies, and sudden infant death syndrome (SIDS).   The nutrients in breast milk are better for the baby than infant formulas, and breast milk helps the baby's brain grow better.   Babies who breastfeed have less gas, colic, and constipation.  For the mother  Breastfeeding helps develop a very special bond between the mother and her baby.   Breastfeeding is convenient, always available at the correct temperature, and costs nothing.   Breastfeeding burns calories in the mother and helps her lose weight that was gained during pregnancy.   Breastfeeding makes the uterus contract back down to normal size faster and slows bleeding following delivery.   Breastfeeding mothers have a lower risk of developing breast cancer.  BREASTFEEDING FREQUENCY  A healthy, full-term baby may breastfeed as often as every hour or space his or her feedings to every 3 hours.   Watch your baby for signs of hunger. Nurse your baby if he or she shows signs of hunger. How often you nurse will vary from baby to baby.   Nurse as often as the baby requests, or when you feel the need to reduce the fullness of your breasts.   Awaken the baby if it has been 3 4 hours since the last feeding.   Frequent feeding will help the mother make more milk and will help prevent problems, such as sore nipples and engorgement of the breasts.  BABY'S POSITION AT THE BREAST  Whether   lying down or sitting, be sure that the baby's tummy is facing your tummy.   Support the breast with 4 fingers underneath the breast and the thumb above. Make sure your fingers are well away from the nipple and baby's mouth.   Stroke the baby's lips gently with your finger or nipple.   When the baby's mouth is open wide enough, place all of your nipple and as much of the areola as possible into your baby's mouth.   Pull the baby in close so the tip of the nose and the baby's cheeks touch the breast during the feeding.   FEEDINGS AND SUCTION  The length of each feeding varies from baby to baby and from feeding to feeding.   The baby must suck about 2 3 minutes for your milk to get to him or her. This is called a "let down." For this reason, allow the baby to feed on each breast as long as he or she wants. Your baby will end the feeding when he or she has received the right balance of nutrients.   To break the suction, put your finger into the corner of the baby's mouth and slide it between his or her gums before removing your breast from his or her mouth. This will help prevent sore nipples.  HOW TO TELL WHETHER YOUR BABY IS GETTING ENOUGH BREAST MILK. Wondering whether or not your baby is getting enough milk is a common concern among mothers. You can be assured that your baby is getting enough milk if:   Your baby is actively sucking and you hear swallowing.   Your baby seems relaxed and satisfied after a feeding.   Your baby nurses at least 8 12 times in a 24 hour time period. Nurse your baby until he or she unlatches or falls asleep at the first breast (at least 10 20 minutes), then offer the second side.   Your baby is wetting 5 6 disposable diapers (6 8 cloth diapers) in a 24 hour period by 5 6 days of age.   Your baby is having at least 3 4 stools every 24 hours for the first 6 weeks. The stool should be soft and yellow.   Your baby should gain 4 7 ounces per week after he or she is 4 days old.   Your breasts feel softer after nursing.  REDUCING BREAST ENGORGEMENT  In the first week after your baby is born, you may experience signs of breast engorgement. When breasts are engorged, they feel heavy, warm, full, and may be tender to the touch. You can reduce engorgement if you:   Nurse frequently, every 2 3 hours. Mothers who breastfeed early and often have fewer problems with engorgement.   Place light ice packs on your breasts for 10 20 minutes between feedings. This reduces swelling.  Wrap the ice packs in a lightweight towel to protect your skin. Bags of frozen vegetables work well for this purpose.   Take a warm shower or apply warm, moist heat to your breast for 5 10 minutes just before each feeding. This increases circulation and helps the milk flow.   Gently massage your breast before and during the feeding. Using your finger tips, massage from the chest wall towards your nipple in a circular motion.   Make sure that the baby empties at least one breast at every feeding before switching sides.   Use a breast pump to empty the breasts if your baby is sleepy or not nursing   well. You may also want to pump if you are returning to work oryou feel you are getting engorged.   Avoid bottle feeds, pacifiers, or supplemental feedings of water or juice in place of breastfeeding. Breast milk is all the food your baby needs. It is not necessary for your baby to have water or formula. In fact, to help your breasts make more milk, it is best not to give your baby supplemental feedings during the early weeks.   Be sure the baby is latched on and positioned properly while breastfeeding.   Wear a supportive bra, avoiding underwire styles.   Eat a balanced diet with enough fluids.   Rest often, relax, and take your prenatal vitamins to prevent fatigue, stress, and anemia.  If you follow these suggestions, your engorgement should improve in 24 48 hours. If you are still experiencing difficulty, call your lactation consultant or caregiver.  CARING FOR YOURSELF Take care of your breasts  Bathe or shower daily.   Avoid using soap on your nipples.   Start feedings on your left breast at one feeding and on your right breast at the next feeding.   You will notice an increase in your milk supply 2 5 days after delivery. You may feel some discomfort from engorgement, which makes your breasts very firm and often tender. Engorgement "peaks" out within 24 48 hours. In the  meantime, apply warm moist towels to your breasts for 5 10 minutes before feeding. Gentle massage and expression of some milk before feeding will soften your breasts, making it easier for your baby to latch on.   Wear a well-fitting nursing bra, and air dry your nipples for a 3 4minutes after each feeding.   Only use cotton bra pads.   Only use pure lanolin on your nipples after nursing. You do not need to wash it off before feeding the baby again. Another option is to express a few drops of breast milk and gently massage it into your nipples.  Take care of yourself  Eat well-balanced meals and nutritious snacks.   Drinking milk, fruit juice, and water to satisfy your thirst (about 8 glasses a day).   Get plenty of rest.  Avoid foods that you notice affect the baby in a bad way.  SEEK MEDICAL CARE IF:   You have difficulty with breastfeeding and need help.   You have a hard, red, sore area on your breast that is accompanied by a fever.   Your baby is too sleepy to eat well or is having trouble sleeping.   Your baby is wetting less than 6 diapers a day, by 5 days of age.   Your baby's skin or white part of his or her eyes is more yellow than it was in the hospital.   You feel depressed.  Document Released: 06/25/2005 Document Revised: 12/25/2011 Document Reviewed: 09/23/2011 [employer] Patient Information 2013 [employer].  

## 2012-09-29 NOTE — Progress Notes (Signed)
Pulse 98  Edema trace in feet.

## 2012-09-29 NOTE — Progress Notes (Signed)
NST reviewed and reactive. FBS 91-144 ,none in range 2 hr pp 57-200--will increase evening NPH to 26 u and am and ac lunch Reg to 22 u.

## 2012-09-29 NOTE — Progress Notes (Signed)
[place] growth @ [facility]FM tomorrow

## 2012-09-30 ENCOUNTER — Ambulatory Visit (HOSPITAL_COMMUNITY)
Admission: RE | Admit: 2012-09-30 | Discharge: 2012-09-30 | Disposition: A | Discharge status: Home / Self Care | Payer: Medicaid Other | Source: Ambulatory Visit | Attending: Family Medicine | Admitting: Family Medicine

## 2012-09-30 DIAGNOSIS — O24919 Unspecified diabetes mellitus in pregnancy, unspecified trimester: Secondary | ICD-10-CM | POA: Insufficient documentation

## 2012-09-30 DIAGNOSIS — Z283 Underimmunization status: Secondary | ICD-10-CM

## 2012-09-30 DIAGNOSIS — O0990 Supervision of high risk pregnancy, unspecified, unspecified trimester: Secondary | ICD-10-CM

## 2012-09-30 DIAGNOSIS — O09299 Supervision of pregnancy with other poor reproductive or obstetric history, unspecified trimester: Secondary | ICD-10-CM | POA: Insufficient documentation

## 2012-09-30 DIAGNOSIS — O24319 Unspecified pre-existing diabetes mellitus in pregnancy, unspecified trimester: Secondary | ICD-10-CM

## 2012-09-30 NOTE — Progress Notes (Signed)
Christie [name]  was seen today for an ultrasound appointment.  See full report in [facility].  Impression: Single IUP at 33 1/7 weeks Pregestational diabetes on insulin Normal interval anatomy Interval growth is appropriate (87th %tile); the [facility] measures > 97th %tile Normal amniotic fluid volume  Recommendations: Continue 2x weekly NSTs Recommend follow-up ultrasound examination in 4 weeks Induction of labor at 39 weeks due to pregestational diabetes  Alpha Gula, MD

## 2012-10-03 ENCOUNTER — Other Ambulatory Visit: Payer: Medicaid Other

## 2012-10-06 ENCOUNTER — Encounter: Payer: Self-pay | Admitting: Obstetrics and Gynecology

## 2012-10-06 ENCOUNTER — Ambulatory Visit (INDEPENDENT_AMBULATORY_CARE_PROVIDER_SITE_OTHER): Payer: Medicaid Other | Admitting: Obstetrics and Gynecology

## 2012-10-06 VITALS — BP 105/74 | Wt 220.0 lb

## 2012-10-06 DIAGNOSIS — O24919 Unspecified diabetes mellitus in pregnancy, unspecified trimester: Secondary | ICD-10-CM

## 2012-10-06 DIAGNOSIS — J45909 Unspecified asthma, uncomplicated: Secondary | ICD-10-CM

## 2012-10-06 DIAGNOSIS — O0990 Supervision of high risk pregnancy, unspecified, unspecified trimester: Secondary | ICD-10-CM

## 2012-10-06 DIAGNOSIS — O24913 Unspecified diabetes mellitus in pregnancy, third trimester: Secondary | ICD-10-CM

## 2012-10-06 DIAGNOSIS — E119 Type 2 diabetes mellitus without complications: Secondary | ICD-10-CM

## 2012-10-06 NOTE — Progress Notes (Signed)
Patient did not bring CBG log but reports fasting and post breakfast and lunch CBG mostly within range. She states that the post dinner values are elevated but cannot remember any numbers. She also admits to not consuming good snacks while she is out during the day shopping for the arrival of her baby. FM/PTL precautions reviewed. NST today. Patient scheduled for growth ultrasound on 4/22 NST reviewed and reactive

## 2012-10-06 NOTE — Progress Notes (Signed)
Pulse: 108

## 2012-10-10 ENCOUNTER — Encounter: Payer: Self-pay | Admitting: Obstetrics & Gynecology

## 2012-10-10 ENCOUNTER — Ambulatory Visit (INDEPENDENT_AMBULATORY_CARE_PROVIDER_SITE_OTHER): Payer: Medicaid Other | Admitting: *Deleted

## 2012-10-10 ENCOUNTER — Other Ambulatory Visit: Payer: Self-pay

## 2012-10-10 ENCOUNTER — Encounter (HOSPITAL_COMMUNITY): Payer: Self-pay | Admitting: *Deleted

## 2012-10-10 ENCOUNTER — Inpatient Hospital Stay (HOSPITAL_COMMUNITY)
Admission: AD | Admit: 2012-10-10 | Discharge: 2012-10-10 | Disposition: A | Discharge status: Home / Self Care | Payer: Medicaid Other | Source: Ambulatory Visit | Attending: Obstetrics and Gynecology | Admitting: Obstetrics and Gynecology

## 2012-10-10 VITALS — BP 95/65

## 2012-10-10 DIAGNOSIS — O0990 Supervision of high risk pregnancy, unspecified, unspecified trimester: Secondary | ICD-10-CM | POA: Insufficient documentation

## 2012-10-10 DIAGNOSIS — O99019 Anemia complicating pregnancy, unspecified trimester: Secondary | ICD-10-CM | POA: Insufficient documentation

## 2012-10-10 DIAGNOSIS — I498 Other specified cardiac arrhythmias: Secondary | ICD-10-CM | POA: Insufficient documentation

## 2012-10-10 DIAGNOSIS — O99013 Anemia complicating pregnancy, third trimester: Secondary | ICD-10-CM

## 2012-10-10 DIAGNOSIS — O24919 Unspecified diabetes mellitus in pregnancy, unspecified trimester: Secondary | ICD-10-CM

## 2012-10-10 DIAGNOSIS — O24913 Unspecified diabetes mellitus in pregnancy, third trimester: Secondary | ICD-10-CM

## 2012-10-10 DIAGNOSIS — O99891 Other specified diseases and conditions complicating pregnancy: Secondary | ICD-10-CM | POA: Insufficient documentation

## 2012-10-10 DIAGNOSIS — R000 Tachycardia, unspecified: Secondary | ICD-10-CM

## 2012-10-10 DIAGNOSIS — D649 Anemia, unspecified: Secondary | ICD-10-CM | POA: Insufficient documentation

## 2012-10-10 LAB — URINALYSIS, ROUTINE W REFLEX MICROSCOPIC
Bilirubin Urine: NEGATIVE
Ketones, ur: NEGATIVE mg/dL
Nitrite: NEGATIVE
Specific Gravity, Urine: 1.015 (ref 1.005–1.030)
Urobilinogen, UA: 0.2 mg/dL (ref 0.0–1.0)

## 2012-10-10 LAB — COMPREHENSIVE METABOLIC PANEL
ALT: 13 U/L (ref 0–35)
AST: 22 U/L (ref 0–37)
Albumin: 2.4 g/dL — ABNORMAL LOW (ref 3.5–5.2)
Calcium: 8.7 mg/dL (ref 8.4–10.5)
Sodium: 132 mEq/L — ABNORMAL LOW (ref 135–145)
Total Protein: 6.2 g/dL (ref 6.0–8.3)

## 2012-10-10 LAB — CBC
MCH: 21.8 pg — ABNORMAL LOW (ref 26.0–34.0)
MCHC: 30.8 g/dL (ref 30.0–36.0)
Platelets: 193 10*3/uL (ref 150–400)

## 2012-10-10 LAB — URINE MICROSCOPIC-ADD ON

## 2012-10-10 MED ORDER — FERROUS SULFATE 325 (65 FE) MG PO TABS: 325.0000 mg | ORAL_TABLET | Freq: Two times a day (BID) | ORAL | Status: DC

## 2012-10-10 MED ORDER — FLINTSTONES COMPLETE 60 MG PO CHEW: 1.0000 | CHEWABLE_TABLET | Freq: Every day | ORAL | Status: DC

## 2012-10-10 NOTE — MAU Provider Note (Signed)
Attestation of Attending Supervision of Advanced Practitioner (CNM/NP): Evaluation and management procedures were performed by the Advanced Practitioner under my supervision and collaboration.  I have reviewed the Advanced Practitioner's note and chart, and I agree with the management and plan.  Kevia Zaucha 10/10/2012 7:35 PM   

## 2012-10-10 NOTE — MAU Note (Signed)
Pt states she did not she had a fast heart rate, states she has been feeling tired for several weeks.  Denies any chest pain.

## 2012-10-10 NOTE — Progress Notes (Signed)
P = 142   Pulse ox during NST showed maternal HR 85-148. Results reported to Dr. Jolayne Panther- pt sent to MAU for EKG and further evaluation.

## 2012-10-10 NOTE — MAU Note (Signed)
Patient is not in the lobby when called to triage.  

## 2012-10-10 NOTE — MAU Note (Signed)
Patient states she was seen in the office today for a regular appointment. Sent to MAU for evaluation of elevated pulse. Having some mild contractions. Denies bleeding or leaking and reports good fetal movement.

## 2012-10-10 NOTE — MAU Provider Note (Signed)
Chief Complaint:  Tachycardia   None     HPI: Christie [name] is a 27 y.o. G3P1011 at [redacted]w[redacted]d who presents to maternity admissions from clinic for tachycardia.  She was found during clinic visit to have pulse of 120, and when pulse oximeter on continuously for a few minutes, HR fluctuated between 90 and 140.  Pt denies any new symptoms today, but reports ongoing mild nausea and blurred vision occasionally when standing up, indicating that these symptoms have each been present for >2 weeks.  She reports good fetal movement, denies LOF, vaginal bleeding, vaginal itching/burning, urinary symptoms, h/a, dizziness, vomiting, or fever/chills.     Past Medical History: Past Medical History  Diagnosis Date  . Asthma   . Hx of migraines   . Anemia   . Depression   . Fx ankle     history of left ankle fx at age 59  . Pregnancy induced hypertension   . Diabetes mellitus     type 1 on insulin  . Headache   . Urinary tract infection   . Chlamydia   . Abnormal Pap smear     colpo scheduled  . Anxiety     Past obstetric history: OB History   Grav Para Term Preterm Abortions TAB SAB Ect Mult Living   3 1 1  0 1 1 0 0 0 1     # Outc Date GA Lbr Len/2nd Wgt Sex Del Anes PTL Lv   1 TRM 3/09   4.252kg(9lb6oz) M SVD EPI No Yes   Comments: pre eclampsia   2 TAB 6/13 [redacted]w[redacted]d          Comments: no complications   3 CUR               Past Surgical History: Past Surgical History  Procedure Laterality Date  . Wisdom tooth extraction    . Colposcopy      Family History: Family History  Problem Relation Age of Onset  . Asthma Mother   . Diabetes Mother   . Hypertension Mother   . Asthma Father   . Anesthesia problems Neg Hx   . Hypertension Sister     Social History: History  Substance Use Topics  . Smoking status: Never Smoker   . Smokeless tobacco: Never Used  . Alcohol Use: No     Comment: occasional (once a month)- not (+) UPT    Allergies:  Allergies  Allergen Reactions   . Banana Anaphylaxis  . Citrus Other (See Comments)    Gums bleed  . Adhesive (Tape) Rash    Meds:  Prescriptions prior to admission  Medication Sig Dispense Refill  . butalbital-acetaminophen-caffeine (FIORICET) 50-325-40 MG per tablet Take 1-2 tablets by mouth every 6 (six) hours as needed for headache.  20 tablet  0  . insulin NPH (HUMULIN N,NOVOLIN N) 100 UNIT/ML injection Inject 26-34 Units into the skin 2 (two) times daily. Inject 34 units sq q am, 26 units sq q hs  1 vial  3  . insulin regular (NOVOLIN R,HUMULIN R) 100 units/mL injection Inject 0.2-0.22 mLs (20-22 Units total) into the skin 3 (three) times daily before meals. 22 units before breakfast and lunch, 20 units prior to dinner.  10 mL  3  . ondansetron (ZOFRAN ODT) 4 MG disintegrating tablet Take 1 tablet (4 mg total) by mouth every 8 (eight) hours as needed for nausea.  20 tablet  0  . Prenatal Vit-Fe Fumarate-FA (PRENATAL MULTIVITAMIN) TABS Take 1 tablet by  mouth every morning.      Marland Kitchen glucose blood (ACCU-CHEK SMARTVIEW) test strip Use as instructed to check blood sugars  100 each  12    ROS: Pertinent findings in history of present illness.  Physical Exam  Blood pressure 101/72, pulse 119, temperature 98 F (36.7 C), resp. rate 18, last menstrual period 02/11/2012, SpO2 99.00%.  GENERAL: Well-developed, well-nourished female in no acute distress.  HEENT: normocephalic HEART: normal rate, rhythm, heart sounds RESP: normal effort, lung sounds clear bilaterally in all lobes ABDOMEN: Soft, non-tender, gravid appropriate for gestational age EXTREMITIES: Nontender, no edema NEURO: alert and oriented SPECULUM EXAM: Deferred     FHT:  Baseline 140 , moderate variability, accelerations present, no decelerations Contractions: occasional, mild to p   Labs: Results for orders placed during the hospital encounter of 10/10/12 (from the past 24 hour(s))  CBC     Status: Abnormal   Collection Time    10/10/12  1:25 PM       Result Value Range   WBC 6.9  4.0 - 10.5 K/uL   RBC 4.44  3.87 - 5.11 MIL/uL   Hemoglobin 9.7 (*) 12.0 - 15.0 g/dL   HCT 19.1 (*) 47.8 - 29.5 %   MCV 70.9 (*) 78.0 - 100.0 fL   MCH 21.8 (*) 26.0 - 34.0 pg   MCHC 30.8  30.0 - 36.0 g/dL   RDW 62.1 (*) 30.8 - 65.7 %   Platelets 193  150 - 400 K/uL  COMPREHENSIVE METABOLIC PANEL     Status: Abnormal   Collection Time    10/10/12  1:25 PM      Result Value Range   Sodium 132 (*) 135 - 145 mEq/L   Potassium 3.9  3.5 - 5.1 mEq/L   Chloride 100  96 - 112 mEq/L   CO2 24  19 - 32 mEq/L   Glucose, Bld 126 (*) 70 - 99 mg/dL   BUN 7  6 - 23 mg/dL   Creatinine, Ser 8.46  0.50 - 1.10 mg/dL   Calcium 8.7  8.4 - 96.2 mg/dL   Total Protein 6.2  6.0 - 8.3 g/dL   Albumin 2.4 (*) 3.5 - 5.2 g/dL   AST 22  0 - 37 U/L   ALT 13  0 - 35 U/L   Alkaline Phosphatase 119 (*) 39 - 117 U/L   Total Bilirubin 0.4  0.3 - 1.2 mg/dL   GFR calc non Af Amer >90  >90 mL/min   GFR calc Af Amer >90  >90 mL/min  URINALYSIS, ROUTINE W REFLEX MICROSCOPIC     Status: Abnormal   Collection Time    10/10/12  1:47 PM      Result Value Range   Color, Urine YELLOW  YELLOW   APPearance HAZY (*) CLEAR   Specific Gravity, Urine 1.015  1.005 - 1.030   pH 6.0  5.0 - 8.0   Glucose, UA NEGATIVE  NEGATIVE mg/dL   Hgb urine dipstick NEGATIVE  NEGATIVE   Bilirubin Urine NEGATIVE  NEGATIVE   Ketones, ur NEGATIVE  NEGATIVE mg/dL   Protein, ur NEGATIVE  NEGATIVE mg/dL   Urobilinogen, UA 0.2  0.0 - 1.0 mg/dL   Nitrite NEGATIVE  NEGATIVE   Leukocytes, UA LARGE (*) NEGATIVE  URINE MICROSCOPIC-ADD ON     Status: Abnormal   Collection Time    10/10/12  1:47 PM      Result Value Range   Squamous Epithelial / LPF MANY (*) RARE  WBC, UA 11-20  <3 WBC/hpf   RBC / HPF 0-2  <3 RBC/hpf      Assessment: 1. Unspecified high-risk pregnancy   2. Sinus tachycardia   3. Anemia complicating pregnancy in third trimester     Plan: EKG reviewed by Dr Venetia Night  Cardiology Discharge home F/U outpatient with [facility] F/U as scheduled in OB clinic Pt is taking gummy prenatal vitamins with no iron--change to chewable flintstones and add ferrous sulfate BID Return to MAU as needed    Medication List    ASK your doctor about these medications       butalbital-acetaminophen-caffeine 50-325-40 MG per tablet  Commonly known as:  FIORICET  Take 1-2 tablets by mouth every 6 (six) hours as needed for headache.     glucose blood test strip  Commonly known as:  ACCU-CHEK SMARTVIEW  Use as instructed to check blood sugars     insulin NPH 100 UNIT/ML injection  Commonly known as:  HUMULIN N,NOVOLIN N  Inject 26-34 Units into the skin 2 (two) times daily. Inject 34 units sq q am, 26 units sq q hs     insulin regular 100 units/mL injection  Commonly known as:  NOVOLIN R,HUMULIN R  Inject 0.2-0.22 mLs (20-22 Units total) into the skin 3 (three) times daily before meals. 22 units before breakfast and lunch, 20 units prior to dinner.     ondansetron 4 MG disintegrating tablet  Commonly known as:  ZOFRAN ODT  Take 1 tablet (4 mg total) by mouth every 8 (eight) hours as needed for nausea.     prenatal multivitamin Tabs  Take 1 tablet by mouth every morning.        Sharen Counter Certified Nurse-Midwife 10/10/2012 12:59 PM

## 2012-10-11 NOTE — Progress Notes (Signed)
4/4 NST reviewed and reactive 

## 2012-10-13 ENCOUNTER — Other Ambulatory Visit: Payer: Self-pay | Admitting: Family Medicine

## 2012-10-13 ENCOUNTER — Ambulatory Visit (INDEPENDENT_AMBULATORY_CARE_PROVIDER_SITE_OTHER): Payer: Medicaid Other | Admitting: Family Medicine

## 2012-10-13 VITALS — BP 124/79 | Temp 97.4°F | Wt 222.0 lb

## 2012-10-13 DIAGNOSIS — O24919 Unspecified diabetes mellitus in pregnancy, unspecified trimester: Secondary | ICD-10-CM

## 2012-10-13 DIAGNOSIS — O24913 Unspecified diabetes mellitus in pregnancy, third trimester: Secondary | ICD-10-CM

## 2012-10-13 LAB — POCT URINALYSIS DIP (DEVICE)
Glucose, UA: 500 mg/dL — AB
Ketones, ur: NEGATIVE mg/dL
Specific Gravity, Urine: >=1.03 (ref 1.005–1.030)
Urobilinogen, UA: 1 mg/dL (ref 0.0–1.0)

## 2012-10-13 NOTE — Progress Notes (Signed)
Pulse: 77

## 2012-10-13 NOTE — Progress Notes (Signed)
FBS 77-100 (2 out of range) 2 hr 55-132 (2 of 21 out of range) NST reviewed and reactive. Cultures next week

## 2012-10-13 NOTE — Patient Instructions (Signed)
Gestational Diabetes Mellitus Gestational diabetes mellitus (GDM) is diabetes that occurs only during pregnancy. This happens when the body cannot properly handle the glucose (sugar) that increases in the blood after eating. During pregnancy, insulin resistance (reduced sensitivity to insulin) occurs because of the release of hormones from the placenta. Usually, the pancreas of pregnant women produces enough insulin to overcome the resistance that occurs. However, in gestational diabetes, the insulin is there but it does not work effectively. If the resistance is severe enough that the pancreas does not produce enough insulin, extra glucose builds up in the blood.  WHO IS AT RISK FOR DEVELOPING GESTATIONAL DIABETES?  Women with a history of diabetes in the family.  Women over age 25.  Women who are overweight.  Women in certain ethnic groups (Hispanic, African American, Native American, Asian and Pacific Islander). WHAT CAN HAPPEN TO THE BABY? If the mother's blood glucose is too high while she is pregnant, the extra sugar will travel through the umbilical cord to the baby. Some of the problems the baby may have are:  Large Baby - If the baby receives too much sugar, the baby will gain more weight. This may cause the baby to be too large to be born normally (vaginally) and a Cesarean section (C-section) may be needed.  Low Blood Glucose (hypoglycemia)  The baby makes extra insulin, in response to the extra sugar its gets from its mother. When the baby is born and no longer needs this extra insulin, the baby's blood glucose level may drop.  Jaundice (yellow coloring of the skin and eyes)  This is fairly common in babies. It is caused from a build-up of the chemical called bilirubin. This is rarely serious, but is seen more often in babies whose mothers had gestational diabetes. RISKS TO THE MOTHER Women who have had gestational diabetes may be at higher risk for some problems,  including:  Preeclampsia or toxemia, which includes problems with high blood pressure. Blood pressure and protein levels in the urine must be checked frequently.  Infections.  Cesarean section (C-section) for delivery.  Developing Type 2 diabetes later in life. About 30-50% will develop diabetes later, especially if obese. DIAGNOSIS  The hormones that cause insulin resistance are highest at about 24-28 weeks of pregnancy. If symptoms are experienced, they are much like symptoms you would normally expect during pregnancy.  GDM is often diagnosed using a two part method: 1. After 24-28 weeks of pregnancy, the woman drinks a glucose solution and takes a blood test. If the glucose level is high, a second test will be given. 2. Oral Glucose Tolerance Test (OGTT) which is 3 hours long  After not eating overnight, the blood glucose is checked. The woman drinks a glucose solution, and hourly blood glucose tests are taken. If the woman has risk factors for GDM, the caregiver may test earlier than 24 weeks of pregnancy. TREATMENT  Treatment of GDM is directed at keeping the mother's blood glucose level normal, and may include:  Meal planning.  Taking insulin or other medicine to control your blood glucose level.  Exercise.  Keeping a daily record of the foods you eat.  Blood glucose monitoring and keeping a record of your blood glucose levels.  May monitor ketone levels in the urine, although this is no longer considered necessary in most pregnancies. HOME CARE INSTRUCTIONS  While you are pregnant:  Follow your caregiver's advice regarding your prenatal appointments, meal planning, exercise, medicines, vitamins, blood and other tests, and physical   activities.  Keep a record of your meals, blood glucose tests, and the amount of insulin you are taking (if any). Show this to your caregiver at every prenatal visit.  If you have GDM, you may have problems with hypoglycemia (low blood glucose).  You may suspect this if you become suddenly dizzy, feel shaky, and/or weak. If you think this is happening and you have a glucose meter, try to test your blood glucose level. Follow your caregiver's advice for when and how to treat your low blood glucose. Generally, the 15:15 rule is followed: Treat by consuming 15 grams of carbohydrates, wait 15 minutes, and recheck blood glucose. Examples of 15 grams of carbohydrates are:  1 cup skim or low-fat milk.   cup juice.  3-4 glucose tablets.  5-6 hard candies.  1 small box raisins.   cup regular soda pop.  Practice good hygiene, to avoid infections.  Do not smoke. SEEK MEDICAL CARE IF:   You develop abnormal vaginal discharge, with or without itching.  You become weak and tired more than expected.  You seem to sweat a lot.  You have a sudden increase in weight, 5 pounds or more in one week.  You are losing weight, 3 pounds or more in a week.  Your blood glucose level is high, and you need instructions on what to do about it. SEEK IMMEDIATE MEDICAL CARE IF:   You develop a severe headache.  You faint or pass out.  You develop nausea and vomiting.  You become disoriented or confused.  You have a convulsion.  You develop vision problems.  You develop stomach pain.  You develop vaginal bleeding.  You develop uterine contractions.  You have leaking or a gush of fluid from the vagina. AFTER YOU HAVE THE BABY:  Go to all of your follow-up appointments, and have blood tests as advised by your caregiver.  Maintain a healthy lifestyle, to prevent diabetes in the future. This includes:  Following a healthy meal plan.  Controlling your weight.  Getting enough exercise and proper rest.  Do not smoke.  Breastfeed your baby if you can. This will lower the chance of you and your baby developing diabetes later in life. For more information about diabetes, go to the American Diabetes Association at:  PMFashions.com.cy. For more information about gestational diabetes, go to the [employer] at: RentRule.com.au. Document Released: 10/01/2000 Document Revised: 09/17/2011 Document Reviewed: 04/25/2009 [facility] Patient Information 2013 [place], [place].  Postpartum Depression and Baby Blues The postpartum period begins right after the birth of a baby. During this time, there is often a great amount of joy and excitement. It is also a time of considerable changes in the life of the parent(s). Regardless of how many times a mother gives birth, each child brings new challenges and dynamics to the family. It is not unusual to have feelings of excitement accompanied by confusing shifts in moods, emotions, and thoughts. All mothers are at risk of developing postpartum depression or the "baby blues." These mood changes can occur right after giving birth, or they may occur many months after giving birth. The baby blues or postpartum depression can be mild or severe. Additionally, postpartum depression can resolve rather quickly, or it can be a long-term condition. CAUSES Elevated hormones and their rapid decline are thought to be a main cause of postpartum depression and the baby blues. There are a number of hormones that radically change during and after pregnancy. Estrogen and progesterone usually  decrease immediately after delivering your baby. The level of thyroid hormone and various cortisol steroids also rapidly drop. Other factors that play a major role in these changes include major life events and genetics.  RISK FACTORS If you have any of the following risks for the baby blues or postpartum depression, know what symptoms to watch out for during the postpartum period. Risk factors that may increase the likelihood of getting the baby blues or postpartum depression include:  Havinga personal or family history of depression.  Having depression while  being pregnant.  Having premenstrual or oral contraceptive-associated mood issues.  Having exceptional life stress.  Having marital conflict.  Lacking a social support network.  Having a baby with special needs.  Having health problems such as diabetes. SYMPTOMS Baby blues symptoms include:  Brief fluctuations in mood, such as going from extreme happiness to sadness.  Decreased concentration.  Difficulty sleeping.  Crying spells, tearfulness.  Irritability.  Anxiety. Postpartum depression symptoms typically begin within the first month after giving birth. These symptoms include:  Difficulty sleeping or excessive sleepiness.  Marked weight loss.  Agitation.  Feelings of worthlessness.  Lack of interest in activity or food. Postpartum psychosis is a very concerning condition and can be dangerous. Fortunately, it is rare. Displaying any of the following symptoms is cause for immediate medical attention. Postpartum psychosis symptoms include:  Hallucinations and delusions.  Bizarre or disorganized behavior.  Confusion or disorientation. DIAGNOSIS  A diagnosis is made by an evaluation of your symptoms. There are no medical or lab tests that lead to a diagnosis, but there are various questionnaires that a caregiver may use to identify those with the baby blues, postpartum depression, or psychosis. Often times, a screening tool called the [place] Postnatal Depression Scale is used to diagnose depression in the postpartum period.  TREATMENT The baby blues usually goes away on its own in 1 to 2 weeks. Social support is often all that is needed. You should be encouraged to get adequate sleep and rest. Occasionally, you may be given medicines to help you sleep.  Postpartum depression requires treatment as it can last several months or longer if it is not treated. Treatment may include individual or group therapy, medicine, or both to address any social, physiological, and  psychological factors that may play a role in the depression. Regular exercise, a healthy diet, rest, and social support may also be strongly recommended.  Postpartum psychosis is more serious and needs treatment right away. Hospitalization is often needed. HOME CARE INSTRUCTIONS  Get as much rest as you can. Nap when the baby sleeps.  Exercise regularly. Some women find yoga and walking to be beneficial.  Eat a balanced and nourishing diet.  Do little things that you enjoy. Have a cup of tea, take a bubble bath, read your favorite magazine, or listen to your favorite music.  Avoid alcohol.  Ask for help with household chores, cooking, grocery shopping, or running errands as needed. Do not try to do everything.  Talk to people close to you about how you are feeling. Get support from your partner, family members, friends, or other new moms.  Try to stay positive in how you think. Think about the things you are grateful for.  Do not spend a lot of time alone.  Only take medicine as directed by your caregiver.  Keep all your postpartum appointments.  Let your caregiver know if you have any concerns. SEEK MEDICAL CARE IF: You are having a reaction or problems  with your medicine. SEEK IMMEDIATE MEDICAL CARE IF:  You have suicidal feelings.  You feel you may harm the baby or someone else. Document Released: 03/29/2004 Document Revised: 09/17/2011 Document Reviewed: 05/01/2011 [facility] Patient Information 2013 [place], [place].  Breastfeeding Deciding to breastfeed is one of the best choices you can make for you and your baby. The information that follows gives a brief overview of the benefits of breastfeeding as well as common topics surrounding breastfeeding. BENEFITS OF BREASTFEEDING For the baby  The first milk (colostrum) helps the baby's digestive system function better.   There are antibodies in the mother's milk that help the baby fight off infections.   The baby has a  lower incidence of asthma, allergies, and sudden infant death syndrome (SIDS).   The nutrients in breast milk are better for the baby than infant formulas, and breast milk helps the baby's brain grow better.   Babies who breastfeed have less gas, colic, and constipation.  For the mother  Breastfeeding helps develop a very special bond between the mother and her baby.   Breastfeeding is convenient, always available at the correct temperature, and costs nothing.   Breastfeeding burns calories in the mother and helps her lose weight that was gained during pregnancy.   Breastfeeding makes the uterus contract back down to normal size faster and slows bleeding following delivery.   Breastfeeding mothers have a lower risk of developing breast cancer.  BREASTFEEDING FREQUENCY  A healthy, full-term baby may breastfeed as often as every hour or space his or her feedings to every 3 hours.   Watch your baby for signs of hunger. Nurse your baby if he or she shows signs of hunger. How often you nurse will vary from baby to baby.   Nurse as often as the baby requests, or when you feel the need to reduce the fullness of your breasts.   Awaken the baby if it has been 3 4 hours since the last feeding.   Frequent feeding will help the mother make more milk and will help prevent problems, such as sore nipples and engorgement of the breasts.  BABY'S POSITION AT THE BREAST  Whether lying down or sitting, be sure that the baby's tummy is facing your tummy.   Support the breast with 4 fingers underneath the breast and the thumb above. Make sure your fingers are well away from the nipple and baby's mouth.   Stroke the baby's lips gently with your finger or nipple.   When the baby's mouth is open wide enough, place all of your nipple and as much of the areola as possible into your baby's mouth.   Pull the baby in close so the tip of the nose and the baby's cheeks touch the breast during  the feeding.  FEEDINGS AND SUCTION  The length of each feeding varies from baby to baby and from feeding to feeding.   The baby must suck about 2 3 minutes for your milk to get to him or her. This is called a "let down." For this reason, allow the baby to feed on each breast as long as he or she wants. Your baby will end the feeding when he or she has received the right balance of nutrients.   To break the suction, put your finger into the corner of the baby's mouth and slide it between his or her gums before removing your breast from his or her mouth. This will help prevent sore nipples.  HOW TO TELL WHETHER  YOUR BABY IS GETTING ENOUGH BREAST MILK. Wondering whether or not your baby is getting enough milk is a common concern among mothers. You can be assured that your baby is getting enough milk if:   Your baby is actively sucking and you hear swallowing.   Your baby seems relaxed and satisfied after a feeding.   Your baby nurses at least 8 12 times in a 24 hour time period. Nurse your baby until he or she unlatches or falls asleep at the first breast (at least 10 20 minutes), then offer the second side.   Your baby is wetting 5 6 disposable diapers (6 8 cloth diapers) in a 24 hour period by 70 28 days of age.   Your baby is having at least 3 4 stools every 24 hours for the first 6 weeks. The stool should be soft and yellow.   Your baby should gain 4 7 ounces per week after he or she is 67 days old.   Your breasts feel softer after nursing.  REDUCING BREAST ENGORGEMENT  In the first week after your baby is born, you may experience signs of breast engorgement. When breasts are engorged, they feel heavy, warm, full, and may be tender to the touch. You can reduce engorgement if you:   Nurse frequently, every 2 3 hours. Mothers who breastfeed early and often have fewer problems with engorgement.   Place light ice packs on your breasts for 10 20 minutes between feedings. This  reduces swelling. Wrap the ice packs in a lightweight towel to protect your skin. Bags of frozen vegetables work well for this purpose.   Take a warm shower or apply warm, moist heat to your breast for 5 10 minutes just before each feeding. This increases circulation and helps the milk flow.   Gently massage your breast before and during the feeding. Using your finger tips, massage from the chest wall towards your nipple in a circular motion.   Make sure that the baby empties at least one breast at every feeding before switching sides.   Use a breast pump to empty the breasts if your baby is sleepy or not nursing well. You may also want to pump if you are returning to work oryou feel you are getting engorged.   Avoid bottle feeds, pacifiers, or supplemental feedings of water or juice in place of breastfeeding. Breast milk is all the food your baby needs. It is not necessary for your baby to have water or formula. In fact, to help your breasts make more milk, it is best not to give your baby supplemental feedings during the early weeks.   Be sure the baby is latched on and positioned properly while breastfeeding.   Wear a supportive bra, avoiding underwire styles.   Eat a balanced diet with enough fluids.   Rest often, relax, and take your prenatal vitamins to prevent fatigue, stress, and anemia.  If you follow these suggestions, your engorgement should improve in 24 48 hours. If you are still experiencing difficulty, call your lactation consultant or caregiver.  CARING FOR YOURSELF Take care of your breasts  Bathe or shower daily.   Avoid using soap on your nipples.   Start feedings on your left breast at one feeding and on your right breast at the next feeding.   You will notice an increase in your milk supply 2 5 days after delivery. You may feel some discomfort from engorgement, which makes your breasts very firm and often tender. Engorgement "  peaks" out within 24 48  hours. In the meantime, apply warm moist towels to your breasts for 5 10 minutes before feeding. Gentle massage and expression of some milk before feeding will soften your breasts, making it easier for your baby to latch on.   Wear a well-fitting nursing bra, and air dry your nipples for a 3 after each feeding.   Only use cotton bra pads.   Only use pure lanolin on your nipples after nursing. You do not need to wash it off before feeding the baby again. Another option is to express a few drops of breast milk and gently massage it into your nipples.  Take care of yourself  Eat well-balanced meals and nutritious snacks.   Drinking milk, fruit juice, and water to satisfy your thirst (about 8 glasses a day).   Get plenty of rest.  Avoid foods that you notice affect the baby in a bad way.  SEEK MEDICAL CARE IF:   You have difficulty with breastfeeding and need help.   You have a hard, red, sore area on your breast that is accompanied by a fever.   Your baby is too sleepy to eat well or is having trouble sleeping.   Your baby is wetting less than 6 diapers a day, by 52 days of age.   Your baby's skin or white part of his or her eyes is more yellow than it was in the hospital.   You feel depressed.  Document Released: 06/25/2005 Document Revised: 12/25/2011 Document Reviewed: 09/23/2011 [facility] Patient Information 2013 [place], [place].

## 2012-10-17 ENCOUNTER — Ambulatory Visit (INDEPENDENT_AMBULATORY_CARE_PROVIDER_SITE_OTHER): Payer: Medicaid Other | Admitting: *Deleted

## 2012-10-17 VITALS — BP 113/68

## 2012-10-17 DIAGNOSIS — O24913 Unspecified diabetes mellitus in pregnancy, third trimester: Secondary | ICD-10-CM

## 2012-10-17 DIAGNOSIS — O24919 Unspecified diabetes mellitus in pregnancy, unspecified trimester: Secondary | ICD-10-CM

## 2012-10-17 NOTE — Progress Notes (Signed)
NST reviewed and reactive.  Zonya Gudger L. Harraway-Smith, M.D., FACOG    

## 2012-10-17 NOTE — Progress Notes (Signed)
P-84  

## 2012-10-20 ENCOUNTER — Other Ambulatory Visit: Payer: Self-pay | Admitting: Obstetrics and Gynecology

## 2012-10-20 ENCOUNTER — Encounter: Payer: Self-pay | Admitting: Obstetrics and Gynecology

## 2012-10-20 ENCOUNTER — Ambulatory Visit (INDEPENDENT_AMBULATORY_CARE_PROVIDER_SITE_OTHER): Payer: Medicaid Other | Admitting: Obstetrics and Gynecology

## 2012-10-20 VITALS — BP 117/78 | Temp 97.6°F | Wt 221.0 lb

## 2012-10-20 DIAGNOSIS — O09293 Supervision of pregnancy with other poor reproductive or obstetric history, third trimester: Secondary | ICD-10-CM

## 2012-10-20 DIAGNOSIS — E119 Type 2 diabetes mellitus without complications: Secondary | ICD-10-CM

## 2012-10-20 DIAGNOSIS — Z3490 Encounter for supervision of normal pregnancy, unspecified, unspecified trimester: Secondary | ICD-10-CM

## 2012-10-20 DIAGNOSIS — O9989 Other specified diseases and conditions complicating pregnancy, childbirth and the puerperium: Secondary | ICD-10-CM

## 2012-10-20 DIAGNOSIS — J45909 Unspecified asthma, uncomplicated: Secondary | ICD-10-CM

## 2012-10-20 DIAGNOSIS — O24913 Unspecified diabetes mellitus in pregnancy, third trimester: Secondary | ICD-10-CM

## 2012-10-20 DIAGNOSIS — O09299 Supervision of pregnancy with other poor reproductive or obstetric history, unspecified trimester: Secondary | ICD-10-CM

## 2012-10-20 DIAGNOSIS — Z283 Underimmunization status: Secondary | ICD-10-CM

## 2012-10-20 DIAGNOSIS — O24919 Unspecified diabetes mellitus in pregnancy, unspecified trimester: Secondary | ICD-10-CM

## 2012-10-20 LAB — POCT URINALYSIS DIP (DEVICE)
Bilirubin Urine: NEGATIVE
Glucose, UA: NEGATIVE mg/dL
Hgb urine dipstick: NEGATIVE
Nitrite: NEGATIVE
Specific Gravity, Urine: 1.02 (ref 1.005–1.030)

## 2012-10-20 NOTE — Progress Notes (Signed)
Pulse: 86

## 2012-10-20 NOTE — Progress Notes (Signed)
[place] for growth on 4/22 @ [facility]

## 2012-10-20 NOTE — Progress Notes (Signed)
NST reviewed and reactive. FM/PTL precautions discussed. CBG majority within range. Cultures collected

## 2012-10-21 LAB — GC/CHLAMYDIA PROBE AMP
CT Probe RNA: NEGATIVE
GC Probe RNA: NEGATIVE

## 2012-10-23 ENCOUNTER — Encounter: Payer: Self-pay | Admitting: Obstetrics and Gynecology

## 2012-10-23 ENCOUNTER — Ambulatory Visit (INDEPENDENT_AMBULATORY_CARE_PROVIDER_SITE_OTHER): Payer: Medicaid Other | Admitting: *Deleted

## 2012-10-23 VITALS — BP 113/75

## 2012-10-23 DIAGNOSIS — O24913 Unspecified diabetes mellitus in pregnancy, third trimester: Secondary | ICD-10-CM

## 2012-10-23 DIAGNOSIS — O24919 Unspecified diabetes mellitus in pregnancy, unspecified trimester: Secondary | ICD-10-CM

## 2012-10-23 NOTE — Progress Notes (Signed)
NST performed today was reviewed and was found to be reactive.  Continue recommended antenatal testing and prenatal care.  

## 2012-10-23 NOTE — Progress Notes (Signed)
P-90 

## 2012-10-27 ENCOUNTER — Ambulatory Visit (INDEPENDENT_AMBULATORY_CARE_PROVIDER_SITE_OTHER): Payer: Medicaid Other | Admitting: Obstetrics & Gynecology

## 2012-10-27 ENCOUNTER — Encounter: Payer: Self-pay | Admitting: *Deleted

## 2012-10-27 VITALS — BP 124/80 | Temp 97.5°F | Wt 225.8 lb

## 2012-10-27 DIAGNOSIS — O24913 Unspecified diabetes mellitus in pregnancy, third trimester: Secondary | ICD-10-CM

## 2012-10-27 DIAGNOSIS — E119 Type 2 diabetes mellitus without complications: Secondary | ICD-10-CM

## 2012-10-27 DIAGNOSIS — O0990 Supervision of high risk pregnancy, unspecified, unspecified trimester: Secondary | ICD-10-CM

## 2012-10-27 DIAGNOSIS — O24919 Unspecified diabetes mellitus in pregnancy, unspecified trimester: Secondary | ICD-10-CM

## 2012-10-27 LAB — POCT URINALYSIS DIP (DEVICE)
Ketones, ur: NEGATIVE mg/dL
Nitrite: NEGATIVE
Protein, ur: 30 mg/dL — AB

## 2012-10-27 NOTE — Patient Instructions (Signed)
Return to clinic for any obstetric concerns or go to [facility] for evaluation  

## 2012-10-27 NOTE — Progress Notes (Signed)
[place] for growth @ [facility] tomorrow

## 2012-10-27 NOTE — Progress Notes (Signed)
Overall CBGs within range except for a few values. Emphasized adherence to diet.  Recheck in one week, no change to insulin regimen. Growth scan scheduled tomorrow 10/28/12.  NST performed today was reviewed and was found to be reactive.  Continue recommended antenatal testing and prenatal care. No other complaints or concerns. Cervix was 3/thick/high/posterior.  Fetal movement and labor precautions reviewed.

## 2012-10-27 NOTE — Progress Notes (Signed)
Pulse- 82  Pain/pressure- "when sit to stand"  Edema-feet   Contractions after urinate

## 2012-10-28 ENCOUNTER — Ambulatory Visit (HOSPITAL_COMMUNITY)
Admission: RE | Admit: 2012-10-28 | Discharge: 2012-10-28 | Disposition: A | Discharge status: Home / Self Care | Payer: Medicaid Other | Source: Ambulatory Visit | Attending: Obstetrics & Gynecology | Admitting: Obstetrics & Gynecology

## 2012-10-28 DIAGNOSIS — O09299 Supervision of pregnancy with other poor reproductive or obstetric history, unspecified trimester: Secondary | ICD-10-CM | POA: Insufficient documentation

## 2012-10-28 DIAGNOSIS — O24919 Unspecified diabetes mellitus in pregnancy, unspecified trimester: Secondary | ICD-10-CM | POA: Insufficient documentation

## 2012-10-28 DIAGNOSIS — O24319 Unspecified pre-existing diabetes mellitus in pregnancy, unspecified trimester: Secondary | ICD-10-CM

## 2012-10-29 ENCOUNTER — Telehealth: Payer: Self-pay | Admitting: *Deleted

## 2012-10-29 DIAGNOSIS — O24913 Unspecified diabetes mellitus in pregnancy, third trimester: Secondary | ICD-10-CM

## 2012-10-29 MED ORDER — INSULIN REGULAR HUMAN 100 UNIT/ML IJ SOLN: 20.0000 [IU] | Freq: Three times a day (TID) | INTRAMUSCULAR | Status: DC

## 2012-10-29 NOTE — Telephone Encounter (Signed)
Patient called because she is having trouble picking up her insulin the Novolin R because the instructions have changed and [facility] won't fill it because they say she is requesting refills too soon. I told her that I would send in a new prescription with corrected dosages.

## 2012-10-30 ENCOUNTER — Ambulatory Visit (INDEPENDENT_AMBULATORY_CARE_PROVIDER_SITE_OTHER): Payer: Medicaid Other | Admitting: *Deleted

## 2012-10-30 VITALS — BP 115/78

## 2012-10-30 DIAGNOSIS — O24913 Unspecified diabetes mellitus in pregnancy, third trimester: Secondary | ICD-10-CM

## 2012-10-30 DIAGNOSIS — O24919 Unspecified diabetes mellitus in pregnancy, unspecified trimester: Secondary | ICD-10-CM

## 2012-10-30 NOTE — Progress Notes (Signed)
P = 92 

## 2012-10-30 NOTE — Progress Notes (Signed)
NST 10/30/12 reactive 

## 2012-11-03 ENCOUNTER — Inpatient Hospital Stay (HOSPITAL_COMMUNITY)
Admission: AD | Admit: 2012-11-03 | Discharge: 2012-11-06 | DRG: 774 | Disposition: A | Discharge status: Home / Self Care | Payer: Medicaid Other | Source: Ambulatory Visit | Attending: Obstetrics and Gynecology | Admitting: Obstetrics and Gynecology

## 2012-11-03 ENCOUNTER — Encounter: Payer: Medicaid Other | Admitting: Obstetrics & Gynecology

## 2012-11-03 ENCOUNTER — Ambulatory Visit (INDEPENDENT_AMBULATORY_CARE_PROVIDER_SITE_OTHER): Payer: Medicaid Other | Admitting: Obstetrics & Gynecology

## 2012-11-03 ENCOUNTER — Telehealth (HOSPITAL_COMMUNITY): Payer: Self-pay | Admitting: *Deleted

## 2012-11-03 ENCOUNTER — Encounter (HOSPITAL_COMMUNITY): Payer: Self-pay | Admitting: *Deleted

## 2012-11-03 ENCOUNTER — Other Ambulatory Visit: Payer: Self-pay | Admitting: Obstetrics & Gynecology

## 2012-11-03 VITALS — BP 121/72

## 2012-11-03 DIAGNOSIS — O24919 Unspecified diabetes mellitus in pregnancy, unspecified trimester: Secondary | ICD-10-CM

## 2012-11-03 DIAGNOSIS — O139 Gestational [pregnancy-induced] hypertension without significant proteinuria, unspecified trimester: Secondary | ICD-10-CM | POA: Diagnosis present

## 2012-11-03 DIAGNOSIS — Z283 Underimmunization status: Secondary | ICD-10-CM

## 2012-11-03 DIAGNOSIS — Z794 Long term (current) use of insulin: Secondary | ICD-10-CM

## 2012-11-03 DIAGNOSIS — E119 Type 2 diabetes mellitus without complications: Secondary | ICD-10-CM

## 2012-11-03 DIAGNOSIS — O24913 Unspecified diabetes mellitus in pregnancy, third trimester: Secondary | ICD-10-CM

## 2012-11-03 DIAGNOSIS — O09293 Supervision of pregnancy with other poor reproductive or obstetric history, third trimester: Secondary | ICD-10-CM

## 2012-11-03 DIAGNOSIS — O0990 Supervision of high risk pregnancy, unspecified, unspecified trimester: Secondary | ICD-10-CM

## 2012-11-03 DIAGNOSIS — O1493 Unspecified pre-eclampsia, third trimester: Secondary | ICD-10-CM

## 2012-11-03 DIAGNOSIS — E109 Type 1 diabetes mellitus without complications: Secondary | ICD-10-CM | POA: Diagnosis present

## 2012-11-03 DIAGNOSIS — O2432 Unspecified pre-existing diabetes mellitus in childbirth: Principal | ICD-10-CM | POA: Diagnosis present

## 2012-11-03 DIAGNOSIS — D689 Coagulation defect, unspecified: Secondary | ICD-10-CM | POA: Diagnosis present

## 2012-11-03 DIAGNOSIS — D696 Thrombocytopenia, unspecified: Secondary | ICD-10-CM | POA: Diagnosis present

## 2012-11-03 DIAGNOSIS — Z3490 Encounter for supervision of normal pregnancy, unspecified, unspecified trimester: Secondary | ICD-10-CM

## 2012-11-03 LAB — POCT URINALYSIS DIP (DEVICE)
Hgb urine dipstick: NEGATIVE
Nitrite: NEGATIVE
Protein, ur: 100 mg/dL — AB
pH: 7 (ref 5.0–8.0)

## 2012-11-03 NOTE — Progress Notes (Signed)
P = 62   IOL scheduled 11/10/12 @ 0630

## 2012-11-03 NOTE — Telephone Encounter (Signed)
Preadmission screen  

## 2012-11-03 NOTE — Progress Notes (Signed)
Good CBGs.   4/26 EFW 3871 g (8 lb 9 oz)/>90%; normal AFV; cephalic.  Continue insulin regimen. No other complaints or concerns.  Fetal movement and labor precautions reviewed. NST performed today was reviewed and was found to be reactive, AFI 11.2 cm.  Continue recommended antenatal testing and prenatal care.

## 2012-11-03 NOTE — MAU Note (Signed)
Contractions 5 minutes for last hour. No leaking of fluid or vaginal bleeding

## 2012-11-03 NOTE — Patient Instructions (Signed)
Return to clinic for any obstetric concerns or go to [facility] for evaluation  

## 2012-11-04 ENCOUNTER — Encounter (HOSPITAL_COMMUNITY): Payer: Self-pay | Admitting: Anesthesiology

## 2012-11-04 ENCOUNTER — Inpatient Hospital Stay (HOSPITAL_COMMUNITY): Payer: Medicaid Other | Admitting: Anesthesiology

## 2012-11-04 DIAGNOSIS — D689 Coagulation defect, unspecified: Secondary | ICD-10-CM

## 2012-11-04 DIAGNOSIS — E109 Type 1 diabetes mellitus without complications: Secondary | ICD-10-CM

## 2012-11-04 DIAGNOSIS — O139 Gestational [pregnancy-induced] hypertension without significant proteinuria, unspecified trimester: Secondary | ICD-10-CM

## 2012-11-04 DIAGNOSIS — O9912 Other diseases of the blood and blood-forming organs and certain disorders involving the immune mechanism complicating childbirth: Secondary | ICD-10-CM

## 2012-11-04 DIAGNOSIS — O2432 Unspecified pre-existing diabetes mellitus in childbirth: Secondary | ICD-10-CM

## 2012-11-04 LAB — TYPE AND SCREEN: Antibody Screen: NEGATIVE

## 2012-11-04 LAB — COMPREHENSIVE METABOLIC PANEL
Albumin: 2.2 g/dL — ABNORMAL LOW (ref 3.5–5.2)
Alkaline Phosphatase: 145 U/L — ABNORMAL HIGH (ref 39–117)
BUN: 6 mg/dL (ref 6–23)
Chloride: 103 mEq/L (ref 96–112)
Creatinine, Ser: 0.67 mg/dL (ref 0.50–1.10)
GFR calc Af Amer: >90 mL/min (ref >90)
Glucose, Bld: 169 mg/dL — ABNORMAL HIGH (ref 70–99)
Potassium: 4.4 mEq/L (ref 3.5–5.1)
Total Bilirubin: 0.3 mg/dL (ref 0.3–1.2)

## 2012-11-04 LAB — GLUCOSE, CAPILLARY: Glucose-Capillary: 101 mg/dL — ABNORMAL HIGH (ref 70–99)

## 2012-11-04 LAB — CBC
HCT: 32.3 % — ABNORMAL LOW (ref 36.0–46.0)
Hemoglobin: 9.9 g/dL — ABNORMAL LOW (ref 12.0–15.0)
MCH: 21.7 pg — ABNORMAL LOW (ref 26.0–34.0)
MCHC: 30.7 g/dL (ref 30.0–36.0)
MCV: 70.8 fL — ABNORMAL LOW (ref 78.0–100.0)

## 2012-11-04 MED ORDER — EPHEDRINE 5 MG/ML INJ
10.0000 mg | INTRAVENOUS | Status: DC | PRN
  Filled 2012-11-04: qty 2

## 2012-11-04 MED ORDER — OXYTOCIN BOLUS FROM INFUSION: 500.0000 mL | INTRAVENOUS | Status: DC

## 2012-11-04 MED ORDER — ONDANSETRON HCL 4 MG/2ML IJ SOLN: 4.0000 mg | INTRAMUSCULAR | Status: DC | PRN

## 2012-11-04 MED ORDER — SIMETHICONE 80 MG PO CHEW: 80.0000 mg | CHEWABLE_TABLET | ORAL | Status: DC | PRN

## 2012-11-04 MED ORDER — LACTATED RINGERS IV SOLN
INTRAVENOUS | Status: DC
  Administered 2012-11-04: 11:00:00 via INTRAUTERINE

## 2012-11-04 MED ORDER — INSULIN ASPART 100 UNIT/ML ~~LOC~~ SOLN
3.0000 [IU] | Freq: Three times a day (TID) | SUBCUTANEOUS | Status: DC | PRN
  Administered 2012-11-05: 5 [IU] via SUBCUTANEOUS

## 2012-11-04 MED ORDER — SODIUM CHLORIDE 0.9 % IV SOLN
INTRAVENOUS | Status: DC
  Administered 2012-11-04: 11:00:00 via EPIDURAL
  Filled 2012-11-04 (×2): qty 20

## 2012-11-04 MED ORDER — PHENYLEPHRINE 40 MCG/ML (10ML) SYRINGE FOR IV PUSH (FOR BLOOD PRESSURE SUPPORT)
80.0000 ug | PREFILLED_SYRINGE | INTRAVENOUS | Status: DC | PRN
  Filled 2012-11-04: qty 2

## 2012-11-04 MED ORDER — OXYTOCIN 40 UNITS IN LACTATED RINGERS INFUSION - SIMPLE MED
62.5000 mL/h | INTRAVENOUS | Status: DC
  Filled 2012-11-04: qty 1000

## 2012-11-04 MED ORDER — FENTANYL 2.5 MCG/ML BUPIVACAINE 1/10 % EPIDURAL INFUSION (WH - ANES)
INTRAMUSCULAR | Status: DC | PRN
  Administered 2012-11-04: 14 mL/h via EPIDURAL

## 2012-11-04 MED ORDER — BUPIVACAINE HCL (PF) 0.25 % IJ SOLN
INTRAMUSCULAR | Status: DC | PRN
  Administered 2012-11-04 (×2): 5 mL via EPIDURAL

## 2012-11-04 MED ORDER — TETANUS-DIPHTH-ACELL PERTUSSIS 5-2.5-18.5 LF-MCG/0.5 IM SUSP
0.5000 mL | Freq: Once | INTRAMUSCULAR | Status: AC
  Administered 2012-11-05: 0.5 mL via INTRAMUSCULAR
  Filled 2012-11-04: qty 0.5

## 2012-11-04 MED ORDER — SODIUM CHLORIDE 0.9 % IV SOLN: INTRAVENOUS | Status: DC

## 2012-11-04 MED ORDER — ONDANSETRON HCL 4 MG/2ML IJ SOLN
4.0000 mg | Freq: Four times a day (QID) | INTRAMUSCULAR | Status: DC | PRN
  Administered 2012-11-04 (×2): 4 mg via INTRAVENOUS
  Filled 2012-11-04 (×2): qty 2

## 2012-11-04 MED ORDER — LACTATED RINGERS IV SOLN
INTRAVENOUS | Status: DC
  Administered 2012-11-05: 03:00:00 via INTRAVENOUS

## 2012-11-04 MED ORDER — FENTANYL 2.5 MCG/ML BUPIVACAINE 1/10 % EPIDURAL INFUSION (WH - ANES)
14.0000 mL/h | INTRAMUSCULAR | Status: DC | PRN
  Filled 2012-11-04: qty 125

## 2012-11-04 MED ORDER — OXYCODONE-ACETAMINOPHEN 5-325 MG PO TABS: 1.0000 | ORAL_TABLET | ORAL | Status: DC | PRN

## 2012-11-04 MED ORDER — OXYTOCIN 40 UNITS IN LACTATED RINGERS INFUSION - SIMPLE MED
1.0000 m[IU]/min | INTRAVENOUS | Status: DC
  Administered 2012-11-04: 666 m[IU]/min via INTRAVENOUS
  Administered 2012-11-04: 2 m[IU]/min via INTRAVENOUS

## 2012-11-04 MED ORDER — NALBUPHINE SYRINGE 5 MG/0.5 ML
10.0000 mg | INJECTION | INTRAMUSCULAR | Status: DC | PRN
  Administered 2012-11-04: 10 mg via INTRAMUSCULAR
  Filled 2012-11-04: qty 1

## 2012-11-04 MED ORDER — FENTANYL CITRATE 0.05 MG/ML IJ SOLN
INTRAMUSCULAR | Status: DC | PRN
  Administered 2012-11-04: 100 ug via EPIDURAL

## 2012-11-04 MED ORDER — BENZOCAINE-MENTHOL 20-0.5 % EX AERO
1.0000 "application " | INHALATION_SPRAY | CUTANEOUS | Status: DC | PRN
  Administered 2012-11-04 – 2012-11-06 (×2): 1 via TOPICAL
  Filled 2012-11-04 (×2): qty 56

## 2012-11-04 MED ORDER — MAGNESIUM SULFATE 40 G IN LACTATED RINGERS - SIMPLE
2.0000 g/h | INTRAVENOUS | Status: DC
  Filled 2012-11-04: qty 500

## 2012-11-04 MED ORDER — DIPHENHYDRAMINE HCL 50 MG/ML IJ SOLN: 12.5000 mg | INTRAMUSCULAR | Status: DC | PRN

## 2012-11-04 MED ORDER — LACTATED RINGERS IV SOLN
500.0000 mL | Freq: Once | INTRAVENOUS | Status: AC
  Administered 2012-11-04: 500 mL via INTRAVENOUS

## 2012-11-04 MED ORDER — ACETAMINOPHEN 325 MG PO TABS: 650.0000 mg | ORAL_TABLET | ORAL | Status: DC | PRN

## 2012-11-04 MED ORDER — LIDOCAINE HCL (PF) 1 % IJ SOLN
30.0000 mL | INTRAMUSCULAR | Status: DC | PRN
  Filled 2012-11-04 (×2): qty 30

## 2012-11-04 MED ORDER — PHENYLEPHRINE 40 MCG/ML (10ML) SYRINGE FOR IV PUSH (FOR BLOOD PRESSURE SUPPORT)
PREFILLED_SYRINGE | INTRAVENOUS | Status: AC
  Filled 2012-11-04: qty 5

## 2012-11-04 MED ORDER — NALBUPHINE SYRINGE 5 MG/0.5 ML
10.0000 mg | INJECTION | INTRAMUSCULAR | Status: DC | PRN
  Filled 2012-11-04: qty 1

## 2012-11-04 MED ORDER — DIPHENHYDRAMINE HCL 25 MG PO CAPS: 25.0000 mg | ORAL_CAPSULE | Freq: Four times a day (QID) | ORAL | Status: DC | PRN

## 2012-11-04 MED ORDER — ONDANSETRON HCL 4 MG PO TABS: 4.0000 mg | ORAL_TABLET | ORAL | Status: DC | PRN

## 2012-11-04 MED ORDER — TERBUTALINE SULFATE 1 MG/ML IJ SOLN: 0.2500 mg | Freq: Once | INTRAMUSCULAR | Status: DC | PRN

## 2012-11-04 MED ORDER — OXYCODONE-ACETAMINOPHEN 5-325 MG PO TABS
1.0000 | ORAL_TABLET | ORAL | Status: DC | PRN
  Administered 2012-11-04 – 2012-11-06 (×5): 1 via ORAL
  Filled 2012-11-04 (×5): qty 1

## 2012-11-04 MED ORDER — MEASLES, MUMPS & RUBELLA VAC ~~LOC~~ INJ
0.5000 mL | INJECTION | Freq: Once | SUBCUTANEOUS | Status: DC
  Filled 2012-11-04: qty 0.5

## 2012-11-04 MED ORDER — LANOLIN HYDROUS EX OINT: TOPICAL_OINTMENT | CUTANEOUS | Status: DC | PRN

## 2012-11-04 MED ORDER — DIBUCAINE 1 % RE OINT: 1.0000 "application " | TOPICAL_OINTMENT | RECTAL | Status: DC | PRN

## 2012-11-04 MED ORDER — LIDOCAINE HCL (PF) 1 % IJ SOLN
INTRAMUSCULAR | Status: DC | PRN
  Administered 2012-11-04 (×2): 4 mL

## 2012-11-04 MED ORDER — MAGNESIUM SULFATE BOLUS VIA INFUSION
4.0000 g | Freq: Once | INTRAVENOUS | Status: AC
  Administered 2012-11-04: 4 g via INTRAVENOUS
  Filled 2012-11-04: qty 500

## 2012-11-04 MED ORDER — IBUPROFEN 600 MG PO TABS: 600.0000 mg | ORAL_TABLET | Freq: Four times a day (QID) | ORAL | Status: DC | PRN

## 2012-11-04 MED ORDER — MAGNESIUM SULFATE 40 G IN LACTATED RINGERS - SIMPLE
2.0000 g/h | INTRAVENOUS | Status: DC
  Administered 2012-11-05: 2 g/h via INTRAVENOUS
  Filled 2012-11-04 (×2): qty 500

## 2012-11-04 MED ORDER — PRENATAL MULTIVITAMIN CH
1.0000 | ORAL_TABLET | Freq: Every day | ORAL | Status: DC
  Administered 2012-11-04 – 2012-11-06 (×3): 1 via ORAL
  Filled 2012-11-04 (×3): qty 1

## 2012-11-04 MED ORDER — EPHEDRINE 5 MG/ML INJ
INTRAVENOUS | Status: AC
  Filled 2012-11-04: qty 4

## 2012-11-04 MED ORDER — FENTANYL CITRATE 0.05 MG/ML IJ SOLN
INTRAMUSCULAR | Status: AC
  Filled 2012-11-04: qty 2

## 2012-11-04 MED ORDER — SODIUM BICARBONATE 8.4 % IV SOLN
INTRAVENOUS | Status: DC | PRN
  Administered 2012-11-04: 5 mL via EPIDURAL

## 2012-11-04 MED ORDER — CITRIC ACID-SODIUM CITRATE 334-500 MG/5ML PO SOLN: 30.0000 mL | ORAL | Status: DC | PRN

## 2012-11-04 MED ORDER — LACTATED RINGERS IV SOLN
500.0000 mL | INTRAVENOUS | Status: DC | PRN
  Administered 2012-11-04: 200 mL via INTRAVENOUS

## 2012-11-04 MED ORDER — LACTATED RINGERS IV SOLN
INTRAVENOUS | Status: DC
  Administered 2012-11-04 (×4): via INTRAVENOUS

## 2012-11-04 MED ORDER — FENTANYL 2.5 MCG/ML BUPIVACAINE 1/10 % EPIDURAL INFUSION (WH - ANES)
INTRAMUSCULAR | Status: AC
  Administered 2012-11-04: 14 mL/h via EPIDURAL
  Filled 2012-11-04: qty 125

## 2012-11-04 MED ORDER — IBUPROFEN 600 MG PO TABS
600.0000 mg | ORAL_TABLET | Freq: Four times a day (QID) | ORAL | Status: DC
  Administered 2012-11-04 – 2012-11-06 (×9): 600 mg via ORAL
  Filled 2012-11-04 (×9): qty 1

## 2012-11-04 MED ORDER — SENNOSIDES-DOCUSATE SODIUM 8.6-50 MG PO TABS
2.0000 | ORAL_TABLET | Freq: Every day | ORAL | Status: DC
  Administered 2012-11-04 – 2012-11-05 (×2): 2 via ORAL

## 2012-11-04 MED ORDER — WITCH HAZEL-GLYCERIN EX PADS: 1.0000 "application " | MEDICATED_PAD | CUTANEOUS | Status: DC | PRN

## 2012-11-04 MED ORDER — ZOLPIDEM TARTRATE 5 MG PO TABS: 5.0000 mg | ORAL_TABLET | Freq: Every evening | ORAL | Status: DC | PRN

## 2012-11-04 NOTE — H&P (Signed)
Christie [name] is a 27 y.o. female presenting for contractions starting several hours ago. No loss of fluid or bleeding. Not sure about fetal movement - has not been aware of it with contractions.  Mild headache earlier today, no vision changes. No RUQ pain.   Seen in [facility] for Class B GDM on insulin prior to onset of pregnancy (early A1C of 8). No prior high blood pressures. But had to be induced for preeclampsia in prior pregnancy and was on magnesium. Prior vaginal delivery 9 lb, 6 oz, likely GDM.   Maternal Medical History:  Reason for admission: Contractions.   Contractions: Onset was 6-12 hours ago.   Frequency: regular.   Perceived severity is strong.    Fetal activity: Perceived fetal activity is decreased.    Prenatal complications: PIH.     OB History   Grav Para Term Preterm Abortions TAB SAB Ect Mult Living   3 1 1  0 1 1 0 0 0 1     Past Medical History  Diagnosis Date  . Asthma   . Hx of migraines   . Anemia   . Depression   . Fx ankle     history of left ankle fx at age 57  . Pregnancy induced hypertension   . Diabetes mellitus     type 1 on insulin  . Headache   . Urinary tract infection   . Chlamydia   . Abnormal Pap smear     colpo scheduled  . Anxiety    Past Surgical History  Procedure Laterality Date  . Wisdom tooth extraction    . Colposcopy     Family History: family history includes Asthma in her father and mother; Cancer in her maternal grandmother; Diabetes in her maternal grandmother and mother; and Hypertension in her mother and sister.  There is no history of Anesthesia problems. Social History:  reports that she has never smoked. She has never used smokeless tobacco. She reports that she does not drink alcohol or use illicit drugs.  Results for orders placed during the hospital encounter of 11/03/12 (from the past 24 hour(s))  COMPREHENSIVE METABOLIC PANEL     Status: Abnormal   Collection Time    11/04/12 12:25 AM      Result Value  Range   Sodium 134 (*) 135 - 145 mEq/L   Potassium 4.4  3.5 - 5.1 mEq/L   Chloride 103  96 - 112 mEq/L   CO2 23  19 - 32 mEq/L   Glucose, Bld 169 (*) 70 - 99 mg/dL   BUN 6  6 - 23 mg/dL   Creatinine, Ser 4.09  0.50 - 1.10 mg/dL   Calcium 9.4  8.4 - 81.1 mg/dL   Total Protein 5.9 (*) 6.0 - 8.3 g/dL   Albumin 2.2 (*) 3.5 - 5.2 g/dL   AST 24  0 - 37 U/L   ALT 12  0 - 35 U/L   Alkaline Phosphatase 145 (*) 39 - 117 U/L   Total Bilirubin 0.3  0.3 - 1.2 mg/dL   GFR calc non Af Amer >90  >90 mL/min   GFR calc Af Amer >90  >90 mL/min  CBC     Status: Abnormal   Collection Time    11/04/12 12:25 AM      Result Value Range   WBC 6.0  4.0 - 10.5 K/uL   RBC 4.56  3.87 - 5.11 MIL/uL   Hemoglobin 9.9 (*) 12.0 - 15.0 g/dL   HCT  32.3 (*) 36.0 - 46.0 %   MCV 70.8 (*) 78.0 - 100.0 fL   MCH 21.7 (*) 26.0 - 34.0 pg   MCHC 30.7  30.0 - 36.0 g/dL   RDW 95.2 (*) 84.1 - 32.4 %   Platelets 148 (*) 150 - 400 K/uL      Prenatal Transfer Tool  Maternal Diabetes: Yes:  Diabetes Type:  Insulin/Medication controlled Genetic Screening: Normal Maternal Ultrasounds/Referrals: Normal Fetal Ultrasounds or other Referrals:  Fetal echo normal Maternal Substance Abuse:  No Significant Maternal Medications:  None Significant Maternal Lab Results:  Lab values include: Group B Strep negative Other Comments:  None  ROS Pertinent pos and neg stated in HPI  Dilation: 4 Effacement (%): 50 Station: -3 Exam by:: Thad Ranger MD Blood pressure 146/95, pulse 65, temperature 97 F (36.1 C), temperature source Oral, resp. rate 18, last menstrual period 02/11/2012. Maternal Exam:  Uterine Assessment: Contraction strength is firm.  Contraction frequency is regular.   Abdomen: Patient reports generalized tenderness.  Fetal presentation: vertex  Introitus: Normal vulva. Normal vagina.  Ferning test: not done.   Pelvis: adequate for delivery.   Cervix: Cervix evaluated by digital exam.     Fetal Exam Fetal Monitor  Review: Mode: ultrasound.   Baseline rate: 145.  Variability: minimal (<5 bpm).   Pattern: no accelerations and late decelerations.    Fetal State Assessment: Category II - tracings are indeterminate.     Vitals:  BP 140-150s/90-100s, with some in 130/90s.  Physical Exam  Constitutional: She is oriented to person, place, and time. She appears well-developed and well-nourished. She appears distressed.  HENT:  Head: Normocephalic and atraumatic.  Eyes: Conjunctivae and EOM are normal.  Neck: Normal range of motion. Neck supple.  Cardiovascular: Normal rate, regular rhythm and normal heart sounds.   Respiratory: Effort normal and breath sounds normal. No respiratory distress.  GI: Soft. Bowel sounds are normal. There is generalized tenderness. There is no rebound and no guarding.  Genitourinary:  Normal external genitalia, normal vagina. Cervix 4-5/50/-3.  Musculoskeletal: Normal range of motion. She exhibits edema. She exhibits no tenderness (mild pitting edema lower extremities).  Neurological: She is alert and oriented to person, place, and time. She displays abnormal reflex (3+, no clonus).  Skin: Skin is warm and dry.  Psychiatric: She has a normal mood and affect.    Prenatal labs: ABO, Rh: --/--/A POS (09/08 1145) Antibody: NEG (10/03 1153) Rubella: 69.7 (06/10 1105) RPR: NON REAC (10/03 1153)  HBsAg: NEGATIVE (10/03 1153)  HIV: NON REACTIVE (10/03 1153)  GBS: Negative (04/14 0000)   Assessment/Plan: 27 y.o. G3P1011 at [redacted]w[redacted]d with  1.  Class B DM - on insulin - monitor CBG q 2 hours and start glucostabilizer if not controlled 2.  GHTN - worse than recent office visit - suspect preeclampsia - high risk due to prior preeclampsia, some headache. PIH labs:  Low platelets, more protein in urine today than previously. WIll get protein:creatinine ratio and start Mag. 3.  Labor - changing cervix, contracting regularly. Will admit to L&D, start pit if needed. 4.  FHTs cat II 5.   Anticipate SVD.  Napoleon Form 11/04/2012, 1:16 AM

## 2012-11-04 NOTE — Anesthesia Preprocedure Evaluation (Signed)
Anesthesia Evaluation    Airway Mallampati: III TM Distance: >3 FB Neck ROM: Full    Dental no notable dental hx. (+) Teeth Intact   Pulmonary asthma ,  breath sounds clear to auscultation  Pulmonary exam normal       Cardiovascular hypertension, Rhythm:Regular Rate:Normal     Neuro/Psych  Headaches, PSYCHIATRIC DISORDERS Anxiety Depression    GI/Hepatic   Endo/Other  diabetes, Well Controlled, Type 1, Insulin Dependentobesity  Renal/GU   negative genitourinary   Musculoskeletal negative musculoskeletal ROS (+)   Abdominal (+) + obese,   Peds  Hematology negative hematology ROS (+)   Anesthesia Other Findings   Reproductive/Obstetrics PIH                           Anesthesia Physical Anesthesia Plan  ASA: III  Anesthesia Plan: Epidural   Post-op Pain Management:    Induction:   Airway Management Planned: Natural Airway  Additional Equipment:   Intra-op Plan:   Post-operative Plan:   Informed Consent: I have reviewed the patients History and Physical, chart, labs and discussed the procedure including the risks, benefits and alternatives for the proposed anesthesia with the patient or authorized representative who has indicated his/her understanding and acceptance.   Dental advisory given  Plan Discussed with: Anesthesiologist  Anesthesia Plan Comments:         Anesthesia Quick Evaluation

## 2012-11-04 NOTE — Progress Notes (Signed)
1010  Christie [name], cnm called and notified of sve, pt's pain with epidural redosed x 2, and decreased urine output and color.  iupc requested.  Will come for placement after delivery in another room.

## 2012-11-04 NOTE — Progress Notes (Signed)
Patient ID: Christie [name], female   DOB: Jan 11, 1986, 27 y.o.   MRN: [id number]  S: Pt comfortable with epidural  O:   Filed Vitals:   11/04/12 0305 11/04/12 0310 11/04/12 0330 11/04/12 0400  BP: 113/86 125/77 117/79 119/78  Pulse: 78 60 57 68  Temp:      TempSrc:      Resp: 18 18 18 18   Height:      Weight:      SpO2:        CERV:  6.5/90/-2 AROM:  Light meconium  FHTs: 130, mod var, accels present, no decels TOCO:  q 3-6 min  A/P 26 y.o. G3P1011 at [redacted]w[redacted]d with GHTN, low platelets, headache, Class B DM - On mag - BP controlled.  - FHTs reassuring - Making cervical change - AROM, will start pit if not progress - Anticipate SVD  Napoleon Form, MD

## 2012-11-04 NOTE — Anesthesia Postprocedure Evaluation (Signed)
  Anesthesia Post-op Note  Patient: Christie [name]  Procedure(s) Performed: * No procedures listed *  Patient Location: PACU and Mother/Baby  Anesthesia Type:Epidural  Level of Consciousness: awake, alert  and oriented  Airway and Oxygen Therapy: Patient Spontanous Breathing  Post-op Pain: none  Post-op Assessment: Post-op Vital signs reviewed, Patient's Cardiovascular Status Stable, No headache, No backache, No residual numbness and No residual motor weakness  Post-op Vital Signs: Reviewed and stable  Complications: No apparent anesthesia complications

## 2012-11-04 NOTE — H&P (Signed)
Attestation of Attending Supervision of Advanced Practitioner (CNM/NP): Evaluation and management procedures were performed by the Advanced Practitioner under my supervision and collaboration.  I have reviewed the Advanced Practitioner's note and chart, and I agree with the management and plan.  Coulson Wehner 11/04/2012 4:23 AM

## 2012-11-04 NOTE — Progress Notes (Signed)
Patient ID: Christie [name], female   DOB: June 22, 1986, 28 y.o.   MRN: [id number]  S:  Pt feeling pressure   O:   Filed Vitals:   11/04/12 0501 11/04/12 0531 11/04/12 0601 11/04/12 0631  BP: 119/75 120/101 120/75 128/84  Pulse: 61 94 62 67  Temp:      TempSrc:      Resp: 18 18 18 18   Height:      Weight:      SpO2:        Cervix:  8/80-90/-1  FHTs:  145, min-mod var, few accels present, occasional decel - subtle lates TOCO:  q 4 minutes  A/P 26 y.o. G3P1011 at [redacted]w[redacted]d here with SOL, Class B  DM, GHTN/preeclampsia - BP controlled, on mag, platelets low, CMP normal, prot:creat pending - CBG 101, 120 - monitor. May need insulin vs glucostabilizer - Progressing without intervention - Anticipate SVD soon  Napoleon Form, MD

## 2012-11-04 NOTE — Anesthesia Procedure Notes (Signed)
Epidural Patient location during procedure: OB Start time: 11/04/2012 2:30 AM  Staffing Anesthesiologist: Mortimer Bair A. Performed by: anesthesiologist   Preanesthetic Checklist Completed: patient identified, site marked, surgical consent, pre-op evaluation, timeout performed, IV checked, risks and benefits discussed and monitors and equipment checked  Epidural Patient position: sitting Prep: site prepped and draped and DuraPrep Patient monitoring: continuous pulse ox and blood pressure Approach: midline Injection technique: LOR air  Needle:  Needle type: Tuohy  Needle gauge: 17 G Needle length: 9 cm and 9 Needle insertion depth: 7 cm Catheter type: closed end flexible Catheter size: 19 Gauge Catheter at skin depth: 12 cm Test dose: negative and Other  Assessment Events: blood not aspirated, injection not painful, no injection resistance, negative IV test and no paresthesia  Additional Notes Patient identified. Risks and benefits discussed including failed block, incomplete  Pain control, post dural puncture headache, nerve damage, paralysis, blood pressure Changes, nausea, vomiting, reactions to medications-both toxic and allergic and post Partum back pain. All questions were answered. Patient expressed understanding and wished to proceed. Sterile technique was used throughout procedure. Epidural site was Dressed with sterile barrier dressing. No paresthesias, signs of intravascular injection Or signs of intrathecal spread were encountered.  Patient was more comfortable after the epidural was dosed. Please see RN's note for documentation of vital signs and FHR which are stable.

## 2012-11-04 NOTE — Progress Notes (Signed)
Patient ID: Christie [name], female   DOB: 05/07/1986, 27 y.o.   MRN: [id number]   S:  Pt with epidural, more comfortable but still feeling pain  O:   Filed Vitals:   11/04/12 0245 11/04/12 0250 11/04/12 0255 11/04/12 0300  BP: 113/78 115/78 120/72 125/78  Pulse: 83 69 64 75  Temp:      TempSrc:      Resp: 18 18 18 18   Height:      Weight:      SpO2: 100%       CERV:  6/90/-2 Intact  FHTs:  135, min variability, accels present, occasional subtle decel that appears late. TOCO:  q 10 min  A/P 27 y.o. G3P1011 at [redacted]w[redacted]d with GHTN/preeclampsia, Class B DM. - Epidural in place - BP controlled on mag, low platelets, protein:creatine ratio pending - Progressing in dilation - Anticipate SVD  Napoleon Form, MD

## 2012-11-05 DIAGNOSIS — IMO0002 Reserved for concepts with insufficient information to code with codable children: Secondary | ICD-10-CM

## 2012-11-05 LAB — GLUCOSE, CAPILLARY
Glucose-Capillary: 209 mg/dL — ABNORMAL HIGH (ref 70–99)
Glucose-Capillary: 94 mg/dL (ref 70–99)
Glucose-Capillary: 187 mg/dL — ABNORMAL HIGH (ref 70–99)

## 2012-11-05 MED ORDER — INSULIN ASPART 100 UNIT/ML ~~LOC~~ SOLN
5.0000 [IU] | Freq: Once | SUBCUTANEOUS | Status: AC
  Administered 2012-11-05: 5 [IU] via SUBCUTANEOUS

## 2012-11-05 MED ORDER — INSULIN ASPART 100 UNIT/ML ~~LOC~~ SOLN
3.0000 [IU] | Freq: Once | SUBCUTANEOUS | Status: AC
  Administered 2012-11-05: 3 [IU] via SUBCUTANEOUS

## 2012-11-05 MED ORDER — SODIUM CHLORIDE 0.9 % IJ SOLN
3.0000 mL | Freq: Two times a day (BID) | INTRAMUSCULAR | Status: DC
  Administered 2012-11-05: 3 mL via INTRAVENOUS

## 2012-11-05 MED ORDER — SODIUM CHLORIDE 0.9 % IJ SOLN: 3.0000 mL | INTRAMUSCULAR | Status: DC | PRN

## 2012-11-05 NOTE — Progress Notes (Signed)
UR chart review completed.  

## 2012-11-05 NOTE — Progress Notes (Signed)
Patient was referred for history of depression/anxiety.  * Referral screened out by Clinical Social Worker because none of the following criteria appear to apply:  ~ History of anxiety/depression during this pregnancy, or of post-partum depression.  ~ Diagnosis of anxiety and/or depression within last 5 years  ~ History of depression due to pregnancy loss/loss of child  OR  * Patient's symptoms currently being treated with medication and/or therapy.  Please contact the Clinical Social Worker if needs arise, or by the patient's request.

## 2012-11-05 NOTE — Progress Notes (Signed)
Post Partum Day 1 Subjective: no complaints, up ad lib, voiding, tolerating PO and + flatus Denies headache or visual changes  Objective: Blood pressure 103/62, pulse 69, temperature 98.5 F (36.9 C), temperature source Oral, resp. rate 18, height 5\' 9"  (1.753 m), weight 234 lb (106.142 kg), last menstrual period 02/11/2012, SpO2 100.00%. Filed Vitals:   11/05/12 0800 11/05/12 0900 11/05/12 1008 11/05/12 1100  BP: 106/67 106/66 111/70 103/62  Pulse: 65 92 73 69  Temp:      TempSrc:      Resp:      Height:      Weight:      SpO2: 98% 97% 100% 100%    Physical Exam:  General: alert, cooperative and no distress Lochia: appropriate Uterine Fundus: firm Incision: n/a DVT Evaluation: No evidence of DVT seen on physical exam. 1+ edema DTRs normal with no clonus  Recent Labs  11/04/12 0025  HGB 9.9*  HCT 32.3*   Results for orders placed during the hospital encounter of 11/03/12 (from the past 24 hour(s))  MRSA PCR SCREENING     Status: None   Collection Time    11/04/12  1:30 PM      Result Value Range   MRSA by PCR NEGATIVE  NEGATIVE  GLUCOSE, CAPILLARY     Status: Abnormal   Collection Time    11/04/12  4:29 PM      Result Value Range   Glucose-Capillary 161 (*) 70 - 99 mg/dL  GLUCOSE, CAPILLARY     Status: Abnormal   Collection Time    11/05/12  1:02 AM      Result Value Range   Glucose-Capillary 209 (*) 70 - 99 mg/dL  GLUCOSE, CAPILLARY     Status: None   Collection Time    11/05/12  7:34 AM      Result Value Range   Glucose-Capillary 94  70 - 99 mg/dL  GLUCOSE, CAPILLARY     Status: Abnormal   Collection Time    11/05/12 10:53 AM      Result Value Range   Glucose-Capillary 187 (*) 70 - 99 mg/dL   Comment 1 Documented in Chart     Comment 2 Notify RN       Assessment/Plan: Plan for discharge tomorrow Discontinue Magnesium sulfate Transfer to floor Will give 3 units Insulin for latest blood sugar 187   LOS: 2 days   [facility] 11/05/2012, 1:20  PM

## 2012-11-06 ENCOUNTER — Other Ambulatory Visit: Payer: Medicaid Other

## 2012-11-06 LAB — GLUCOSE, CAPILLARY: Glucose-Capillary: 104 mg/dL — ABNORMAL HIGH (ref 70–99)

## 2012-11-06 MED ORDER — INSULIN ASPART 100 UNIT/ML ~~LOC~~ SOLN
0.0000 [IU] | Freq: Three times a day (TID) | SUBCUTANEOUS | Status: DC
  Administered 2012-11-06: 1 [IU] via SUBCUTANEOUS

## 2012-11-06 MED ORDER — INSULIN GLARGINE 100 UNIT/ML ~~LOC~~ SOLN
15.0000 [IU] | Freq: Every day | SUBCUTANEOUS | Status: DC
  Administered 2012-11-06: 15 [IU] via SUBCUTANEOUS
  Filled 2012-11-06 (×2): qty 0.15

## 2012-11-06 MED ORDER — INSULIN GLARGINE 100 UNIT/ML ~~LOC~~ SOLN: 15.0000 [IU] | Freq: Every day | SUBCUTANEOUS | Status: DC

## 2012-11-06 MED ORDER — OXYCODONE-ACETAMINOPHEN 5-325 MG PO TABS: 1.0000 | ORAL_TABLET | ORAL | Status: DC | PRN

## 2012-11-06 MED ORDER — IBUPROFEN 600 MG PO TABS: 600.0000 mg | ORAL_TABLET | Freq: Four times a day (QID) | ORAL | Status: DC

## 2012-11-06 MED ORDER — INSULIN ASPART 100 UNIT/ML ~~LOC~~ SOLN: 0.0000 [IU] | Freq: Three times a day (TID) | SUBCUTANEOUS | Status: DC

## 2012-11-06 NOTE — Discharge Summary (Signed)
Obstetric Discharge Summary Reason for Admission: onset of labor, Class B DM (reportedly type 1) Prenatal Procedures: Normal ultrasound, and normal Fetal ECHO Intrapartum Procedures: spontaneous vaginal delivery Postpartum Procedures: none Complications-Operative and Postpartum: none Hemoglobin  Date Value Range Status  11/04/2012 9.9* 12.0 - 15.0 g/dL Final     HCT  Date Value Range Status  11/04/2012 32.3* 36.0 - 46.0 % Final    Physical Exam:  General: alert, cooperative, appears stated age and no distress Lochia: appropriate Uterine Fundus: firm Incision:  DVT Evaluation: No evidence of DVT seen on physical exam. Negative Homan's sign. No cords or calf tenderness. No significant calf/ankle edema.  Discharge Diagnoses: Term Pregnancy-delivered, Preelampsia and Pre-eclampsia, DM - Class B (type 1)   Discharge Information: Date: 11/06/2012 Activity: pelvic rest Diet: routine Medications: PNV, Ibuprofen and Colace, Lantus 15Units (previously on 20 prior to preg) & Novolog sliding scale Condition: stable Instructions: refer to practice specific booklet Discharge to: home   Pt had SROM, with Pre-ecclampsia requiring Magnesium.  Otherwise uneventful hospital course.  Day of discharge CBGs still elevated, restarted on reduced Lantus dose and Novolog.  Pt to follow up with Dr. Shawnie Pons for Primary Care (Diabetes management).    Pt will need Lantus dose adjusted once over immediate antepartum period.      Medication List    STOP taking these medications       insulin NPH 100 UNIT/ML injection  Commonly known as:  HUMULIN N,NOVOLIN N     insulin regular 100 units/mL injection  Commonly known as:  NOVOLIN R,HUMULIN R     ondansetron 4 MG disintegrating tablet  Commonly known as:  ZOFRAN ODT     promethazine 12.5 MG tablet  Commonly known as:  PHENERGAN      TAKE these medications       butalbital-acetaminophen-caffeine 50-325-40 MG per tablet  Commonly known as:   FIORICET  Take 1-2 tablets by mouth every 6 (six) hours as needed for headache.     ferrous sulfate 325 (65 FE) MG tablet  Commonly known as:  FERROUSUL  Take 1 tablet (325 mg total) by mouth 2 (two) times daily.     flintstones complete 60 MG chewable tablet  Chew 1 tablet by mouth daily.     glucose blood test strip  Commonly known as:  ACCU-CHEK SMARTVIEW  Use as instructed to check blood sugars     ibuprofen 600 MG tablet  Commonly known as:  ADVIL,MOTRIN  Take 1 tablet (600 mg total) by mouth every 6 (six) hours.     insulin aspart 100 UNIT/ML injection  Commonly known as:  novoLOG  Inject 0-9 Units into the skin 3 (three) times daily with meals.     insulin glargine 100 UNIT/ML injection  Commonly known as:  LANTUS  Inject 0.15 mLs (15 Units total) into the skin daily with breakfast.            Discharge Orders   Future Appointments Provider Department Dept Phone   11/14/2012 10:15 AM Christie Bores, Christie [name] [facility] [phone]   12/03/2012 2:45 PM Christie [place], Christie [name] [facility] [phone]   Future Orders Complete By Expires     Discharge patient  As directed         Newborn Data: Live born female  Birth Weight: 9 lb 8 oz (4310 g) APGAR: 5, 6 Home with mother.    Christie Mews, Christie [name] [facility] Resident - PGY-2 11/06/2012 9:32  AM I have seen and examined this patient and agree the above assessment. Christie [name],Christie [name] 11/09/2012 2:44 PM

## 2012-11-10 ENCOUNTER — Inpatient Hospital Stay (HOSPITAL_COMMUNITY): Admission: RE | Admit: 2012-11-10 | Payer: Medicaid Other | Source: Ambulatory Visit

## 2012-11-11 NOTE — Discharge Summary (Signed)
Attestation of Attending Supervision of Advanced Practitioner (CNM/NP): Evaluation and management procedures were performed by the Advanced Practitioner under my supervision and collaboration.  I have reviewed the Advanced Practitioner's note and chart, and I agree with the management and plan.  Christie [name] 11/11/2012 9:41 AM

## 2012-11-14 ENCOUNTER — Inpatient Hospital Stay: Payer: Medicaid Other | Admitting: Family Medicine

## 2012-11-17 ENCOUNTER — Encounter: Payer: Self-pay | Admitting: *Deleted

## 2012-12-01 ENCOUNTER — Encounter (HOSPITAL_COMMUNITY): Payer: Self-pay

## 2012-12-01 ENCOUNTER — Inpatient Hospital Stay (HOSPITAL_COMMUNITY)
Admission: AD | Admit: 2012-12-01 | Discharge: 2012-12-01 | Disposition: A | Discharge status: Home / Self Care | Payer: Medicaid Other | Source: Ambulatory Visit | Attending: Obstetrics & Gynecology | Admitting: Obstetrics & Gynecology

## 2012-12-01 DIAGNOSIS — O9279 Other disorders of lactation: Secondary | ICD-10-CM | POA: Insufficient documentation

## 2012-12-01 DIAGNOSIS — N644 Mastodynia: Secondary | ICD-10-CM | POA: Insufficient documentation

## 2012-12-01 MED ORDER — IBUPROFEN 800 MG PO TABS
800.0000 mg | ORAL_TABLET | Freq: Once | ORAL | Status: AC
  Administered 2012-12-01: 800 mg via ORAL
  Filled 2012-12-01: qty 1

## 2012-12-01 MED ORDER — IBUPROFEN 800 MG PO TABS: 800.0000 mg | ORAL_TABLET | Freq: Three times a day (TID) | ORAL | Status: DC | PRN

## 2012-12-01 NOTE — MAU Note (Signed)
Patient states she had a vaginal delivery on 4-29 and has been pumping breast mild to feed with a bottle. States about two weeks ago the left breast started becoming sore, now having pain in both breasts and nipples hurt.

## 2012-12-01 NOTE — MAU Provider Note (Signed)
History     CSN: [id number]  Arrival date and time: 12/01/12 1120   First Provider Initiated Contact with Patient 12/01/12 1205      Chief Complaint  Patient presents with  . Breast Pain   HPI  Christie [name] is a 27 y.o. G3P1011 who is 4 weeks PP. She is here today with c/o breast pain. She denies fever, chills or myalgias. She states that the baby does not go to the breast, but she tries to pump every 3-4 hours. She states that she gets variable amounts of milk when she pumps. Sometimes 1 oz and sometimes 5 ozs.  She has not tried any motrin for the pain. She has not seen the [facility] since d/c home.   Past Medical History  Diagnosis Date  . Asthma   . Hx of migraines   . Anemia   . Depression   . Fx ankle     history of left ankle fx at age 79  . Pregnancy induced hypertension   . Diabetes mellitus     type 1 on insulin  . Headache   . Urinary tract infection   . Chlamydia   . Abnormal Pap smear     colpo scheduled  . Anxiety     Past Surgical History  Procedure Laterality Date  . Wisdom tooth extraction    . Colposcopy      Family History  Problem Relation Age of Onset  . Asthma Mother   . Diabetes Mother   . Hypertension Mother   . Asthma Father   . Anesthesia problems Neg Hx   . Hypertension Sister   . Diabetes Maternal Grandmother   . Cancer Maternal Grandmother     breast    History  Substance Use Topics  . Smoking status: Never Smoker   . Smokeless tobacco: Never Used  . Alcohol Use: No     Comment: occasional (once a month)- not (+) UPT    Allergies:  Allergies  Allergen Reactions  . Banana Anaphylaxis  . Citrus Other (See Comments)    Gums bleed  . Adhesive (Tape) Rash    Prescriptions prior to admission  Medication Sig Dispense Refill  . ferrous sulfate (FERROUSUL) 325 (65 FE) MG tablet Take 1 tablet (325 mg total) by mouth 2 (two) times daily.  60 tablet  2  . flintstones complete (FLINTSTONES) 60 MG chewable tablet Chew 1 tablet  by mouth daily.  30 tablet  5  . glucose blood (ACCU-CHEK SMARTVIEW) test strip Use as instructed to check blood sugars  100 each  12  . ibuprofen (ADVIL,MOTRIN) 600 MG tablet Take 1 tablet (600 mg total) by mouth every 6 (six) hours.  30 tablet  0  . insulin aspart (NOVOLOG) 100 UNIT/ML injection Inject 0-9 Units into the skin 3 (three) times daily with meals.  1 vial  1  . insulin glargine (LANTUS) 100 UNIT/ML injection Inject 0.15 mLs (15 Units total) into the skin daily with breakfast.  10 mL  1  . oxyCODONE-acetaminophen (PERCOCET) 5-325 MG per tablet Take 1 tablet by mouth every 4 (four) hours as needed for pain.  20 tablet  0    Review of Systems  Constitutional: Negative for fever and chills.  Musculoskeletal: Negative for myalgias.   Physical Exam   Blood pressure 115/78, pulse 68, temperature 98.2 F (36.8 C), temperature source Oral, resp. rate 16, height 5\' 9"  (1.753 m), weight 89.812 kg (198 lb), last menstrual period 02/11/2012, SpO2 98.00%,  unknown if currently breastfeeding.  Physical Exam  Nursing note and vitals reviewed. Constitutional: She appears well-developed. No distress.  Respiratory:   Breasts: soft, full, no erythema, no masses or lumps.   Skin: Skin is warm and dry.    MAU Course  Procedures    Assessment and Plan   1. Engorgement of breast associated with childbirth, postpartum     Pump q 2-3 hours Ice packs to the breasts Ibuprofen for the discomfort FU with [facility] department.   Tawnya Crook 12/01/2012, 12:13 PM

## 2012-12-03 ENCOUNTER — Ambulatory Visit: Payer: Medicaid Other | Admitting: Obstetrics and Gynecology

## 2012-12-19 ENCOUNTER — Ambulatory Visit (INDEPENDENT_AMBULATORY_CARE_PROVIDER_SITE_OTHER): Payer: Medicaid Other | Admitting: Medical

## 2012-12-19 ENCOUNTER — Encounter: Payer: Self-pay | Admitting: Medical

## 2012-12-19 VITALS — BP 127/83 | HR 86 | Temp 98.9°F | Ht 69.0 in | Wt 198.0 lb

## 2012-12-19 DIAGNOSIS — Z30430 Encounter for insertion of intrauterine contraceptive device: Secondary | ICD-10-CM

## 2012-12-19 MED ORDER — LEVONORGESTREL 20 MCG/24HR IU IUD
INTRAUTERINE_SYSTEM | Freq: Once | INTRAUTERINE | Status: AC
  Administered 2012-12-19: 1 via INTRAUTERINE

## 2012-12-19 NOTE — Patient Instructions (Signed)

## 2012-12-19 NOTE — Progress Notes (Signed)
Patient ID: AMRUTHA AVERA, female   DOB: 1985-09-01, 27 y.o.   MRN: [id number] Subjective:     Christie [name] is a 27 y.o. female who presents for a postpartum visit. She is 6 weeks postpartum following a spontaneous vaginal delivery. I have fully reviewed the prenatal and intrapartum course. The delivery was at 38.1 gestational weeks. Outcome: spontaneous vaginal delivery. Anesthesia: epidural. Postpartum course has been abnormal. Patient was on Mg immediately following delivery. Since discharge from the hospital her course has been normal. Baby's course has been normal. Baby is feeding by bottle - Similac Sensitive RS. Bleeding no bleeding. Bowel function is normal. Bladder function is normal. Patient is not sexually active. Contraception method is IUD. Postpartum depression screening: negative. Last pap smear was 12/2011 and showed ASCUS. Patient had colposcopy and results were "normal" per patient.   The following portions of the patient's history were reviewed and updated as appropriate: allergies, current medications, past family history, past medical history, past social history, past surgical history and problem list.  Review of Systems Pertinent items are noted in HPI.   Objective:    BP 127/83  Pulse 86  Temp(Src) 98.9 F (37.2 C)  Ht 5\' 9"  (1.753 m)  Wt 198 lb (89.812 kg)  BMI 29.23 kg/m2  LMP 12/08/2012  Breastfeeding? No  General:  alert and cooperative   Breasts:  not performed  Lungs: clear to auscultation bilaterally  Heart:  regular rate and rhythm, S1, S2 normal, no murmur, click, rub or gallop  Abdomen: soft, non-tender; bowel sounds normal; no masses,  no organomegaly   Vulva:  normal  Vagina: normal vagina, no discharge, exudate, lesion, or erythema  Cervix:  no lesions and normal contour, minimla friability  Corpus: not performed  Adnexa:  not evaluated  Rectal Exam: Not performed.         GYNECOLOGY CLINIC PROCEDURE NOTE  IUD Insertion Procedure  Note Patient identified, informed consent performed.  Discussed risks of irregular bleeding, cramping, infection, malpositioning or misplacement of the IUD outside the uterus which may require further procedures. Time out was performed.  Urine pregnancy test negative.  Speculum placed in the vagina.  Cervix visualized.  Cleaned with Betadine x 2.  Grasped anteriorly with a single tooth tenaculum.  Uterus sounded to 7 cm.  Mirena IUD placed per manufacturer's recommendations.  Strings trimmed to 3 cm. Tenaculum was removed, good hemostasis noted.  Patient tolerated procedure well.   Patient was given post-procedure instructions.  Patient was also asked to check IUD strings periodically and follow up in 4 weeks for IUD check.   Assessment:     Normal postpartum exam. Pap smear not done at today's visit.   Plan:    1. Contraception: IUD 2. Patient will return in 4 weeks for string check and pap smear.  3. Follow up in: 4 weeks or as needed.

## 2012-12-19 NOTE — Addendum Note (Signed)
Addended by: Sherre Lain A on: 12/19/2012 09:29 AM   Modules accepted: Orders

## 2013-01-23 ENCOUNTER — Ambulatory Visit: Payer: Medicaid Other | Admitting: Advanced Practice Midwife

## 2013-02-11 ENCOUNTER — Ambulatory Visit: Payer: Medicaid Other | Admitting: Family Medicine

## 2013-02-16 ENCOUNTER — Emergency Department (HOSPITAL_BASED_OUTPATIENT_CLINIC_OR_DEPARTMENT_OTHER)
Admission: EM | Admit: 2013-02-16 | Discharge: 2013-02-16 | Disposition: A | Discharge status: Home / Self Care | Payer: Medicaid Other | Attending: Emergency Medicine | Admitting: Emergency Medicine

## 2013-02-16 ENCOUNTER — Encounter (HOSPITAL_BASED_OUTPATIENT_CLINIC_OR_DEPARTMENT_OTHER): Payer: Self-pay | Admitting: *Deleted

## 2013-02-16 DIAGNOSIS — E109 Type 1 diabetes mellitus without complications: Secondary | ICD-10-CM | POA: Insufficient documentation

## 2013-02-16 DIAGNOSIS — R739 Hyperglycemia, unspecified: Secondary | ICD-10-CM

## 2013-02-16 DIAGNOSIS — Z8619 Personal history of other infectious and parasitic diseases: Secondary | ICD-10-CM | POA: Insufficient documentation

## 2013-02-16 DIAGNOSIS — E1169 Type 2 diabetes mellitus with other specified complication: Secondary | ICD-10-CM | POA: Insufficient documentation

## 2013-02-16 DIAGNOSIS — R5381 Other malaise: Secondary | ICD-10-CM | POA: Insufficient documentation

## 2013-02-16 DIAGNOSIS — Z3202 Encounter for pregnancy test, result negative: Secondary | ICD-10-CM | POA: Insufficient documentation

## 2013-02-16 DIAGNOSIS — Z8679 Personal history of other diseases of the circulatory system: Secondary | ICD-10-CM | POA: Insufficient documentation

## 2013-02-16 DIAGNOSIS — Z8659 Personal history of other mental and behavioral disorders: Secondary | ICD-10-CM | POA: Insufficient documentation

## 2013-02-16 DIAGNOSIS — Z794 Long term (current) use of insulin: Secondary | ICD-10-CM | POA: Insufficient documentation

## 2013-02-16 DIAGNOSIS — Z8781 Personal history of (healed) traumatic fracture: Secondary | ICD-10-CM | POA: Insufficient documentation

## 2013-02-16 DIAGNOSIS — Z862 Personal history of diseases of the blood and blood-forming organs and certain disorders involving the immune mechanism: Secondary | ICD-10-CM | POA: Insufficient documentation

## 2013-02-16 DIAGNOSIS — J45909 Unspecified asthma, uncomplicated: Secondary | ICD-10-CM | POA: Insufficient documentation

## 2013-02-16 DIAGNOSIS — H538 Other visual disturbances: Secondary | ICD-10-CM | POA: Insufficient documentation

## 2013-02-16 DIAGNOSIS — R51 Headache: Secondary | ICD-10-CM | POA: Insufficient documentation

## 2013-02-16 DIAGNOSIS — R5383 Other fatigue: Secondary | ICD-10-CM | POA: Insufficient documentation

## 2013-02-16 DIAGNOSIS — Z87440 Personal history of urinary (tract) infections: Secondary | ICD-10-CM | POA: Insufficient documentation

## 2013-02-16 DIAGNOSIS — N390 Urinary tract infection, site not specified: Secondary | ICD-10-CM | POA: Insufficient documentation

## 2013-02-16 LAB — CBC WITH DIFFERENTIAL/PLATELET
Basophils Absolute: 0 10*3/uL (ref 0.0–0.1)
Eosinophils Absolute: 0 10*3/uL (ref 0.0–0.7)
Hemoglobin: 11.8 g/dL — ABNORMAL LOW (ref 12.0–15.0)
Lymphocytes Relative: 49 % — ABNORMAL HIGH (ref 12–46)
MCH: 25.1 pg — ABNORMAL LOW (ref 26.0–34.0)
MCHC: 33.6 g/dL (ref 30.0–36.0)
Monocytes Absolute: 0.2 10*3/uL (ref 0.1–1.0)
Neutrophils Relative %: 42 % — ABNORMAL LOW (ref 43–77)
Platelets: 216 10*3/uL (ref 150–400)
RDW: 13.8 % (ref 11.5–15.5)

## 2013-02-16 LAB — GLUCOSE, CAPILLARY
Glucose-Capillary: 447 mg/dL — ABNORMAL HIGH (ref 70–99)
Glucose-Capillary: 64 mg/dL — ABNORMAL LOW (ref 70–99)
Glucose-Capillary: 67 mg/dL — ABNORMAL LOW (ref 70–99)
Glucose-Capillary: 235 mg/dL — ABNORMAL HIGH (ref 70–99)

## 2013-02-16 LAB — POCT I-STAT 3, VENOUS BLOOD GAS (G3P V)
Bicarbonate: 26.7 mEq/L — ABNORMAL HIGH (ref 20.0–24.0)
O2 Saturation: 46 %
TCO2: 28 mmol/L (ref 0–100)
pCO2, Ven: 46.4 mmHg (ref 45.0–50.0)
pH, Ven: 7.369 — ABNORMAL HIGH (ref 7.250–7.300)
pO2, Ven: 26 mmHg — CL (ref 30.0–45.0)

## 2013-02-16 LAB — URINE MICROSCOPIC-ADD ON

## 2013-02-16 LAB — URINALYSIS, ROUTINE W REFLEX MICROSCOPIC
Glucose, UA: >1000 mg/dL — AB
Ketones, ur: NEGATIVE mg/dL
Leukocytes, UA: NEGATIVE
Nitrite: NEGATIVE
Protein, ur: NEGATIVE mg/dL
Urobilinogen, UA: 0.2 mg/dL (ref 0.0–1.0)

## 2013-02-16 LAB — PREGNANCY, URINE: Preg Test, Ur: NEGATIVE

## 2013-02-16 LAB — BASIC METABOLIC PANEL
BUN: 11 mg/dL (ref 6–23)
Chloride: 95 mEq/L — ABNORMAL LOW (ref 96–112)
Creatinine, Ser: 0.8 mg/dL (ref 0.50–1.10)
GFR calc Af Amer: >90 mL/min (ref >90)
Glucose, Bld: 464 mg/dL — ABNORMAL HIGH (ref 70–99)

## 2013-02-16 MED ORDER — KETOROLAC TROMETHAMINE 60 MG/2ML IM SOLN
INTRAMUSCULAR | Status: AC
  Filled 2013-02-16: qty 2

## 2013-02-16 MED ORDER — NITROFURANTOIN MONOHYD MACRO 100 MG PO CAPS: 100.0000 mg | ORAL_CAPSULE | Freq: Two times a day (BID) | ORAL | Status: DC

## 2013-02-16 MED ORDER — INSULIN REGULAR HUMAN 100 UNIT/ML IJ SOLN: 10.0000 [IU] | Freq: Once | INTRAMUSCULAR | Status: AC

## 2013-02-16 MED ORDER — SODIUM CHLORIDE 0.9 % IV BOLUS (SEPSIS)
1000.0000 mL | Freq: Once | INTRAVENOUS | Status: AC
  Administered 2013-02-16: 1000 mL via INTRAVENOUS

## 2013-02-16 MED ORDER — KETOROLAC TROMETHAMINE 60 MG/2ML IM SOLN: 60.0000 mg | Freq: Once | INTRAMUSCULAR | Status: DC

## 2013-02-16 MED ORDER — INSULIN REGULAR HUMAN 100 UNIT/ML IJ SOLN
INTRAMUSCULAR | Status: AC
  Administered 2013-02-16: 10 [IU] via INTRAVENOUS
  Filled 2013-02-16: qty 1

## 2013-02-16 NOTE — ED Provider Notes (Signed)
  Physical Exam  BP 102/67  Pulse 75  Temp(Src) 98.6 F (37 C) (Oral)  Resp 16  Ht 5\' 9"  (1.753 m)  Wt 200 lb (90.719 kg)  BMI 29.52 kg/m2  SpO2 100%  LMP 02/11/2013  Physical Exam  ED Course  Procedures  Patient's blood sugar has stabilized. will be advised to followup with the [facility].  She is told to return here for any worsening in her condition.     Carlyle Dolly, PA-C 02/16/13 2022

## 2013-02-16 NOTE — ED Provider Notes (Signed)
Medical screening examination/treatment/procedure(s) were performed by non-physician practitioner and as supervising physician I was immediately available for consultation/collaboration.   Glynn Octave, MD 02/16/13 [phone]

## 2013-02-16 NOTE — ED Notes (Addendum)
Pt c/o increased BS at home 600, BS in triage 447 , pt also c/o dull h/a and blurred vision

## 2013-02-16 NOTE — ED Provider Notes (Signed)
CSN: [id number]     Arrival date & time 02/16/13  1453 History     First MD Initiated Contact with Patient 02/16/13 1509     Chief Complaint  Patient presents with  . Hyperglycemia   (Consider location/radiation/quality/duration/timing/severity/associated sxs/prior Treatment) HPI Comments: Pt states that she has had an elevated blood sugar for the last week:pt states that she has been taking her medications like she is supposed to but she started a new job and her schedule is off:pt states that she has been having a headache, blurred vision and fatigue over the last weak:pt denies n/vd, fever, or dysuria  The history is provided by the patient. No language interpreter was used.    Past Medical History  Diagnosis Date  . Asthma   . Hx of migraines   . Anemia   . Depression   . Fx ankle     history of left ankle fx at age 89  . Pregnancy induced hypertension   . Diabetes mellitus     type 1 on insulin  . Headache(784.0)   . Urinary tract infection   . Chlamydia   . Abnormal Pap smear     colpo scheduled  . Anxiety    Past Surgical History  Procedure Laterality Date  . Wisdom tooth extraction    . Colposcopy     Family History  Problem Relation Age of Onset  . Asthma Mother   . Diabetes Mother   . Hypertension Mother   . Asthma Father   . Anesthesia problems Neg Hx   . Hypertension Sister   . Diabetes Maternal Grandmother   . Cancer Maternal Grandmother     breast   History  Substance Use Topics  . Smoking status: Never Smoker   . Smokeless tobacco: Never Used  . Alcohol Use: No     Comment: occasional (once a month)- not (+) UPT   OB History   Grav Para Term Preterm Abortions TAB SAB Ect Mult Living   3 2 2  0 1 1 0 0 0 2     Review of Systems  Constitutional: Negative.   Respiratory: Negative.   Cardiovascular: Negative.     Allergies  Banana; Citrus; and Adhesive  Home Medications   Current Outpatient Rx  Name  Route  Sig  Dispense  Refill  .  ibuprofen (ADVIL,MOTRIN) 800 MG tablet   Oral   Take 1 tablet (800 mg total) by mouth every 8 (eight) hours as needed for pain.   30 tablet   0   . insulin aspart (NOVOLOG) 100 UNIT/ML injection   Subcutaneous   Inject 0-9 Units into the skin 3 (three) times daily with meals.   1 vial   1     CBG < 70: implement hypoglycemia protocol; CBG 70  ...   . insulin glargine (LANTUS) 100 UNIT/ML injection   Subcutaneous   Inject 0.15 mLs (15 Units total) into the skin daily with breakfast.   10 mL   1    BP 124/85  Pulse 85  Temp(Src) 98.6 F (37 C) (Oral)  Resp 16  Ht 5\' 9"  (1.753 m)  Wt 200 lb (90.719 kg)  BMI 29.52 kg/m2  SpO2 98%  LMP 02/11/2013 Physical Exam  Nursing note and vitals reviewed. Constitutional: She is oriented to person, place, and time. She appears well-developed and well-nourished.  HENT:  Head: Normocephalic and atraumatic.  Eyes: Conjunctivae and EOM are normal.  Neck: Normal range of motion. Neck supple.  Cardiovascular: Normal rate and regular rhythm.   Pulmonary/Chest: Effort normal and breath sounds normal.  Abdominal: Soft. Bowel sounds are normal.  Musculoskeletal: Normal range of motion.  Neurological: She is alert and oriented to person, place, and time.  Skin: Skin is warm and dry.    ED Course   Procedures (including critical care time)  Labs Reviewed  GLUCOSE, CAPILLARY - Abnormal; Notable for the following:    Glucose-Capillary 447 (*)    All other components within normal limits  URINALYSIS, ROUTINE W REFLEX MICROSCOPIC - Abnormal; Notable for the following:    Specific Gravity, Urine 1.040 (*)    Glucose, UA >1000 (*)    Hgb urine dipstick MODERATE (*)    All other components within normal limits  CBC WITH DIFFERENTIAL - Abnormal; Notable for the following:    WBC 3.4 (*)    Hemoglobin 11.8 (*)    HCT 35.1 (*)    MCV 74.5 (*)    MCH 25.1 (*)    Neutrophils Relative % 42 (*)    Lymphocytes Relative 49 (*)    Neutro Abs 1.4  (*)    All other components within normal limits  BASIC METABOLIC PANEL - Abnormal; Notable for the following:    Sodium 131 (*)    Chloride 95 (*)    Glucose, Bld 464 (*)    All other components within normal limits  GLUCOSE, CAPILLARY - Abnormal; Notable for the following:    Glucose-Capillary 436 (*)    All other components within normal limits  URINE MICROSCOPIC-ADD ON - Abnormal; Notable for the following:    Squamous Epithelial / LPF FEW (*)    Bacteria, UA FEW (*)    All other components within normal limits  POCT I-STAT 3, BLOOD GAS (G3P V) - Abnormal; Notable for the following:    pH, Ven 7.369 (*)    pO2, Ven 26.0 (*)    Bicarbonate 26.7 (*)    All other components within normal limits  URINE CULTURE  PREGNANCY, URINE  BLOOD GAS, VENOUS   No results found. No diagnosis found.  MDM  Anion gap is 10:pt to be follow up with [facility] PA  Teressa Lower, NP 02/16/13 1655

## 2013-02-16 NOTE — ED Provider Notes (Signed)
Medical screening examination/treatment/procedure(s) were performed by non-physician practitioner and as supervising physician I was immediately available for consultation/collaboration.   Sarabeth Benton, MD 02/16/13 1754 

## 2013-02-16 NOTE — ED Notes (Signed)
Pt requested pain medication for HA. PA made aware and order was received for toradol IM. Pt refused injection stating, "my head don't hurt that bad". PA made aware.

## 2013-02-17 LAB — URINE CULTURE
Colony Count: NO GROWTH
Culture: NO GROWTH

## 2013-02-25 ENCOUNTER — Ambulatory Visit: Payer: Medicaid Other

## 2013-02-27 ENCOUNTER — Ambulatory Visit (INDEPENDENT_AMBULATORY_CARE_PROVIDER_SITE_OTHER): Payer: Medicaid Other | Admitting: Obstetrics and Gynecology

## 2013-02-27 ENCOUNTER — Encounter: Payer: Self-pay | Admitting: Obstetrics and Gynecology

## 2013-02-27 ENCOUNTER — Other Ambulatory Visit (HOSPITAL_COMMUNITY)
Admission: RE | Admit: 2013-02-27 | Discharge: 2013-02-27 | Disposition: A | Discharge status: Home / Self Care | Payer: Medicaid Other | Source: Ambulatory Visit | Attending: Obstetrics and Gynecology | Admitting: Obstetrics and Gynecology

## 2013-02-27 VITALS — BP 116/74 | HR 80 | Temp 98.4°F | Ht 69.0 in | Wt 194.3 lb

## 2013-02-27 DIAGNOSIS — Z30431 Encounter for routine checking of intrauterine contraceptive device: Secondary | ICD-10-CM

## 2013-02-27 DIAGNOSIS — Z113 Encounter for screening for infections with a predominantly sexual mode of transmission: Secondary | ICD-10-CM

## 2013-02-27 DIAGNOSIS — Z124 Encounter for screening for malignant neoplasm of cervix: Secondary | ICD-10-CM

## 2013-02-27 DIAGNOSIS — Z01419 Encounter for gynecological examination (general) (routine) without abnormal findings: Secondary | ICD-10-CM | POA: Insufficient documentation

## 2013-02-27 LAB — POCT URINALYSIS DIP (DEVICE): Protein, ur: 30 mg/dL — AB

## 2013-02-27 LAB — HIV ANTIBODY (ROUTINE TESTING W REFLEX): HIV: NONREACTIVE

## 2013-02-27 LAB — RPR

## 2013-02-27 LAB — HEPATITIS B SURFACE ANTIGEN: Hepatitis B Surface Ag: NEGATIVE

## 2013-02-27 NOTE — Progress Notes (Signed)
Patient ID: Christie [name], female   DOB: 07/16/1985, 27 y.o.   MRN: [id number] 27 yo [id number] presenting today for string check. Patient had IUD placed in 12/2012. Patient is also due for repeat pap smear (ASCUS +HPV during pregnancy, never had repeat pap postpartum) and desires STD testing as well.  GENERAL: Well-developed, well-nourished female in no acute distress.  ABDOMEN: Soft, nontender, nondistended. No organomegaly. PELVIC: Normal external female genitalia. Vagina is pink and rugated.  Normal discharge. Normal appearing cervix. IUD strings visualized at os. Uterus is normal in size. No adnexal mass or tenderness. EXTREMITIES: No cyanosis, clubbing, or edema, 2+ distal pulses.  A/P 27 yo here for IUD check - pap smear collected - STI testing performed - patient will be contacted with any abnormal results

## 2013-03-03 ENCOUNTER — Telehealth: Payer: Self-pay | Admitting: *Deleted

## 2013-03-03 NOTE — Telephone Encounter (Signed)
Colpo scheduled for Oct 20 @ 1:15pm. Pt informed.

## 2013-03-03 NOTE — Telephone Encounter (Signed)
Message copied by Mannie Stabile on Tue Mar 03, 2013  8:17 AM ------      Message from: Catalina Antigua      Created: Tue Mar 03, 2013  7:53 AM       Please inform patient of abnormal pap smear and need for colposcopy appointment ------

## 2013-04-02 ENCOUNTER — Ambulatory Visit (INDEPENDENT_AMBULATORY_CARE_PROVIDER_SITE_OTHER): Payer: Medicaid Other | Admitting: Advanced Practice Midwife

## 2013-04-02 VITALS — BP 115/80 | HR 82 | Temp 96.5°F | Ht 69.0 in | Wt 190.0 lb

## 2013-04-02 DIAGNOSIS — N898 Other specified noninflammatory disorders of vagina: Secondary | ICD-10-CM

## 2013-04-02 DIAGNOSIS — Z113 Encounter for screening for infections with a predominantly sexual mode of transmission: Secondary | ICD-10-CM

## 2013-04-02 NOTE — Progress Notes (Signed)
Subjective:     Patient ID: Christie [name], female   DOB: Apr 01, 1986, 27 y.o.   MRN: [id number]  HPI [id number] presents to clinic for problem visit reporting yellow vaginal discharge.  She has had frequent BV per pt and reports this is similar other than the color.  She has new sexual partner x1 month.  She denies odor with the discharge. She denies vaginal bleeding, vaginal itching/burning, urinary symptoms, h/a, dizziness, n/v, or fever/chills.    Review of Systems  Constitutional: Negative for fever, chills and fatigue.  Respiratory: Negative for shortness of breath.   Cardiovascular: Negative for chest pain.  Genitourinary: Positive for vaginal discharge. Negative for dysuria, flank pain, vaginal bleeding, difficulty urinating, vaginal pain and pelvic pain.  Neurological: Negative for dizziness and headaches.  Psychiatric/Behavioral: Negative.        Objective:   Physical Exam  Constitutional: She is oriented to person, place, and time. She appears well-developed and well-nourished.  Neck: Normal range of motion.  Cardiovascular: Normal rate, regular rhythm and normal heart sounds.   Pulmonary/Chest: Effort normal and breath sounds normal.  Abdominal: Soft. Bowel sounds are normal.  Genitourinary:  Pelvic exam: Cervix pink, visually closed, without lesion, moderate amount white creamy discharge, vaginal walls and external genitalia normal Bimanual exam: Cervix 0/long/high, firm, anterior, neg CMT, uterus nontender, nonenlarged, adnexa without tenderness, enlargement, or mass  Musculoskeletal: Normal range of motion.  Neurological: She is alert and oriented to person, place, and time.  Skin: Skin is warm.  Psychiatric: She has a normal mood and affect. Her behavior is normal. Judgment and thought content normal.       Assessment:     1. Vaginal discharge   2. Routine screening for STI (sexually transmitted infection)        Plan:     Recommend [facility] pending Return to clinic PRN

## 2013-04-02 NOTE — Addendum Note (Signed)
Addended by: Toula Moos on: 04/02/2013 05:04 PM   Modules accepted: Orders

## 2013-04-02 NOTE — Addendum Note (Signed)
Addended by: Franchot Mimes on: 04/02/2013 05:05 PM   Modules accepted: Orders

## 2013-04-03 LAB — WET PREP, GENITAL: Trich, Wet Prep: NONE SEEN

## 2013-04-13 ENCOUNTER — Telehealth: Payer: Self-pay | Admitting: *Deleted

## 2013-04-13 ENCOUNTER — Inpatient Hospital Stay (HOSPITAL_COMMUNITY)
Admission: AD | Admit: 2013-04-13 | Discharge: 2013-04-14 | Disposition: A | Discharge status: Home / Self Care | Payer: Medicaid Other | Source: Ambulatory Visit | Attending: Obstetrics & Gynecology | Admitting: Obstetrics & Gynecology

## 2013-04-13 ENCOUNTER — Encounter (HOSPITAL_COMMUNITY): Payer: Self-pay | Admitting: *Deleted

## 2013-04-13 DIAGNOSIS — E109 Type 1 diabetes mellitus without complications: Secondary | ICD-10-CM | POA: Insufficient documentation

## 2013-04-13 DIAGNOSIS — B9689 Other specified bacterial agents as the cause of diseases classified elsewhere: Secondary | ICD-10-CM

## 2013-04-13 DIAGNOSIS — Z30431 Encounter for routine checking of intrauterine contraceptive device: Secondary | ICD-10-CM | POA: Insufficient documentation

## 2013-04-13 MED ORDER — METRONIDAZOLE 500 MG PO TABS: 500.0000 mg | ORAL_TABLET | Freq: Two times a day (BID) | ORAL | Status: DC

## 2013-04-13 NOTE — MAU Note (Signed)
PT SAYS  IUD IN FROM  GYN CLINIC-    THINKS  3  MTH AGO .  PT SAYS  SHE CAN FEEL  THE  IUD- FELT WHEN SHE WAS SHOWERING- STRING WAS OUT TOO FAR.     LAST SEX-  10-3.   Marland Kitchen   DENIES ANY TRAVEL- RECENTLY.  STATE- 2013.

## 2013-04-13 NOTE — Telephone Encounter (Signed)
Patient complaining of symptoms of BV. Rx sent in per protocol.

## 2013-04-14 ENCOUNTER — Inpatient Hospital Stay (EMERGENCY_DEPARTMENT_HOSPITAL)
Admission: AD | Admit: 2013-04-14 | Discharge: 2013-04-14 | Disposition: A | Discharge status: Home / Self Care | Payer: Medicaid Other | Source: Ambulatory Visit | Attending: Obstetrics & Gynecology | Admitting: Obstetrics & Gynecology

## 2013-04-14 ENCOUNTER — Encounter (HOSPITAL_COMMUNITY): Payer: Self-pay | Admitting: *Deleted

## 2013-04-14 DIAGNOSIS — Z30432 Encounter for removal of intrauterine contraceptive device: Secondary | ICD-10-CM

## 2013-04-14 LAB — URINALYSIS, ROUTINE W REFLEX MICROSCOPIC
Glucose, UA: >1000 mg/dL — AB
Leukocytes, UA: NEGATIVE
Nitrite: NEGATIVE
Protein, ur: NEGATIVE mg/dL

## 2013-04-14 LAB — URINE MICROSCOPIC-ADD ON

## 2013-04-14 LAB — POCT PREGNANCY, URINE: Preg Test, Ur: NEGATIVE

## 2013-04-14 NOTE — MAU Note (Signed)
NOT IN LOBBY 

## 2013-04-14 NOTE — MAU Provider Note (Signed)
History     CSN: [id number]  Arrival date and time: 04/14/13 0751   None     Chief Complaint  Patient presents with  . Vaginal Bleeding  . "IUD falling out"    HPI  Ms. Christie [name] is a 27 y.o. female [id number] who presents with problems with her IUD. She feels her IUD strings are hanging lower in her vagina than normal and feels it may be coming out. She is experiencing some mild abdominal cramping that she rates a 5/10. She is also having some vaginal bleeding; mild currently, was heavy the past few days.   OB History   Grav Para Term Preterm Abortions TAB SAB Ect Mult Living   3 2 2  0 1 1 0 0 0 2      Past Medical History  Diagnosis Date  . Asthma   . Hx of migraines   . Anemia   . Depression   . Fx ankle     history of left ankle fx at age 9  . Pregnancy induced hypertension   . Diabetes mellitus     type 1 on insulin  . Headache(784.0)   . Urinary tract infection   . Chlamydia   . Abnormal Pap smear     colpo scheduled  . Anxiety     Past Surgical History  Procedure Laterality Date  . Wisdom tooth extraction    . Colposcopy      Family History  Problem Relation Age of Onset  . Asthma Mother   . Diabetes Mother   . Hypertension Mother   . Asthma Father   . Anesthesia problems Neg Hx   . Hypertension Sister   . Diabetes Maternal Grandmother   . Cancer Maternal Grandmother     breast    History  Substance Use Topics  . Smoking status: Never Smoker   . Smokeless tobacco: Never Used  . Alcohol Use: No     Comment: occasional (once a month)- not (+) UPT    Allergies:  Allergies  Allergen Reactions  . Banana Anaphylaxis  . Citrus Other (See Comments)    Gums bleed  . Adhesive [Tape] Rash    Prescriptions prior to admission  Medication Sig Dispense Refill  . insulin aspart (NOVOLOG) 100 UNIT/ML injection Inject 1-9 Units into the skin 3 (three) times daily with meals.      . insulin glargine (LANTUS) 100 UNIT/ML injection Inject  20 Units into the skin daily with breakfast.      . [DISCONTINUED] insulin aspart (NOVOLOG) 100 UNIT/ML injection Inject 0-9 Units into the skin 3 (three) times daily with meals.  1 vial  1  . [DISCONTINUED] insulin glargine (LANTUS) 100 UNIT/ML injection Inject 0.15 mLs (15 Units total) into the skin daily with breakfast.  10 mL  1   Results for orders placed during the hospital encounter of 04/14/13 (from the past 24 hour(s))  URINALYSIS, ROUTINE W REFLEX MICROSCOPIC     Status: Abnormal   Collection Time    04/14/13  8:08 AM      Result Value Range   Color, Urine YELLOW  YELLOW   APPearance CLEAR  CLEAR   Specific Gravity, Urine 1.020  1.005 - 1.030   pH 6.0  5.0 - 8.0   Glucose, UA >1000 (*) NEGATIVE mg/dL   Hgb urine dipstick LARGE (*) NEGATIVE   Bilirubin Urine NEGATIVE  NEGATIVE   Ketones, ur 15 (*) NEGATIVE mg/dL   Protein, ur NEGATIVE  NEGATIVE  mg/dL   Urobilinogen, UA 0.2  0.0 - 1.0 mg/dL   Nitrite NEGATIVE  NEGATIVE   Leukocytes, UA NEGATIVE  NEGATIVE  URINE MICROSCOPIC-ADD ON     Status: Abnormal   Collection Time    04/14/13  8:08 AM      Result Value Range   Squamous Epithelial / LPF FEW (*) RARE   WBC, UA 0-2  <3 WBC/hpf   RBC / HPF TOO NUMEROUS TO COUNT  <3 RBC/hpf  POCT PREGNANCY, URINE     Status: None   Collection Time    04/14/13  8:15 AM      Result Value Range   Preg Test, Ur NEGATIVE  NEGATIVE   Review of Systems  Constitutional: Negative for fever and chills.  Gastrointestinal: Negative for nausea, vomiting, diarrhea and constipation.  Genitourinary: Negative for dysuria, urgency, frequency and hematuria.       No vaginal discharge. + vaginal bleeding. No dysuria.   Neurological: Negative for headaches.   Physical Exam   Blood pressure 111/70, pulse 85, temperature 98.4 F (36.9 C), temperature source Oral, resp. rate 18, height 5\' 9"  (1.753 m), weight 187 lb (84.823 kg), last menstrual period 04/10/2013.  Physical Exam  Constitutional: She is  oriented to person, place, and time. She appears well-developed and well-nourished. No distress.  Eyes: Pupils are equal, round, and reactive to light.  Neck: Neck supple.  Respiratory: Effort normal.  GI: Soft. She exhibits no distension. There is no tenderness. There is no rebound and no guarding.  Genitourinary:  Speculum exam: Vagina - Small amount of bright red blood in vaginal canal, no odor, IUD strings observed well in the vaginal canal and strings appear to be hanging out of the vagina.  Cervix - small contact bleeding from cervical os, IUD visualized out of the cervical os.  Bimanual exam: Cervix FT, IUD palpated at cervical os Uterus non tender, normal size Adnexa non tender, no masses bilaterally GC/Chlam, wet prep done Chaperone present for exam. IUD pulled out using a small ring forcep without difficulty, IUD came out intact, minimal bleeding following removal.    Neurological: She is alert and oriented to person, place, and time.  Skin: Skin is warm. She is not diaphoretic.  Psychiatric: Her behavior is normal.    MAU Course  Procedures None  MDM UA UPT- negative Removal of IUD Pt called the clinic and scheduled an Appointment for this Thursday to have another IUD placed.  Pt declines the need for pain medication at this time >1000 glucose in urine, pt encouraged to check her BS when she gets home and to administer Insulin as neccessary.  Assessment and Plan  A: IUD removal     P:  Discharge home Follow up in the clinic on Thursday Oct 9 for IUD insertion Back up method for intercourse discussed Bleeding precautions discussed Return to MAU if symptoms worsen Ok to take ibuprofen 600 mg PO as directed on the bottle.   Devetta Hagenow IRENE FNP-C 04/14/2013, 8:40 AM

## 2013-04-14 NOTE — MAU Note (Signed)
Came to MAU yesterday, but did not wait.  Started bleeding about 4 days ago. States IUD strings lower than usual, then felt farther up and could feel hardness of IUD. [facility] about 3 months ago.

## 2013-04-16 ENCOUNTER — Ambulatory Visit (INDEPENDENT_AMBULATORY_CARE_PROVIDER_SITE_OTHER): Payer: Medicaid Other | Admitting: Obstetrics & Gynecology

## 2013-04-16 ENCOUNTER — Encounter: Payer: Self-pay | Admitting: Obstetrics & Gynecology

## 2013-04-16 VITALS — BP 129/82 | HR 71 | Temp 98.2°F | Ht 69.0 in | Wt 185.7 lb

## 2013-04-16 DIAGNOSIS — Z30430 Encounter for insertion of intrauterine contraceptive device: Secondary | ICD-10-CM

## 2013-04-16 MED ORDER — LEVONORGESTREL 20 MCG/24HR IU IUD
INTRAUTERINE_SYSTEM | Freq: Once | INTRAUTERINE | Status: AC
  Administered 2013-04-16: 1 via INTRAUTERINE

## 2013-04-16 NOTE — Patient Instructions (Signed)
Intrauterine Device Insertion Care After Refer to this sheet in the next few weeks. These instructions provide you with information on caring for yourself after your procedure. Your caregiver may also give you more specific instructions. Your treatment has been planned according to current medical practices, but problems sometimes occur. Call your caregiver if you have any problems or questions after your procedure. HOME CARE INSTRUCTIONS   Only take over-the-counter or prescription medicines for pain, discomfort, or fever as directed by your caregiver. Do not use aspirin. This may increase bleeding.  Check your IUD to make sure it is in place before you resume sexual activity. You should be able to feel the strings. If you cannot feel the strings, something may be wrong. The IUD may have fallen out of the uterus, or the uterus may have been punctured (perforated) during placement. Also, if the strings are getting longer, it may mean that the IUD is being forced out of the uterus. You no longer have full protection from pregnancy if any of these problems occur.  You may resume sexual intercourse if you are not having problems with the IUD. The IUD is considered immediately effective.  You may resume normal activities.  Keep all follow-up appointments to be sure your IUD has remained in place. After the first exam, yearly exams are advised, unless you cannot feel the strings of your IUD.  Continue to check that the IUD is still in place by feeling for the strings after every menstrual period. SEEK MEDICAL CARE IF:   You have bleeding that is heavier or lasts longer than a normal menstrual cycle.  You have a fever.  You have increasing cramps or abdominal pain not relieved with medicine.  You have abdominal pain that does not seem to be related to the same area of earlier cramping and pain.  You are lightheaded, unusually weak, or faint.  You have abnormal vaginal discharge or  smells.  You have pain during sexual intercourse.  You cannot feel the IUD strings, or the IUD string has gotten longer.  You feel the IUD at the opening of the cervix in the vagina.  You think you are pregnant, or you miss your menstrual period.  The IUD string is hurting your sex partner. Document Released: 02/21/2011 Document Revised: 09/17/2011 Document Reviewed: 02/21/2011 [employer] Patient Information 2014 [employer].  

## 2013-04-16 NOTE — Progress Notes (Signed)
GYNECOLOGY CLINIC PROCEDURE NOTE  Christie [name] is a 27 y.o. [phone] here for Mirena IUD re-insertion.  Her last IUD was placed in 12/2012, expelled on 106/14. No GYN concerns.  Last pap smear was on 02/27/13 and showed LGSIL, colposcopy scheduled on 04/25/13.  IUD Insertion Procedure Note Patient identified, informed consent performed.  Discussed risks of irregular bleeding, cramping, infection, malpositioning or misplacement of the IUD outside the uterus which may require further procedures. Time out was performed.  Urine pregnancy test negative.  Speculum placed in the vagina.  Cervix visualized.  Cleaned with Betadine x 2.  Grasped anteriorly with a single tooth tenaculum.  Uterus sounded to 9 cm.  Mirena IUD placed per manufacturer's recommendations.  Strings trimmed to 3 cm. Tenaculum was removed, good hemostasis noted.  Patient tolerated procedure well.   Patient was given post-procedure instructions.  Patient was also asked to check IUD strings periodically and follow up in 4 weeks for IUD check. She will follow up with colposcopy as scheduled.  Christie Other, DO   Attestation of Attending Supervision of Resident: Evaluation and management procedures were performed by the [facility] Medicine Resident under my supervision.  I have seen and examined the patient, reviewed the resident's note and chart, and I agree with the management and plan.  [name], MD, FACOG Attending Obstetrician & Gynecologist Faculty Practice, [facility] [place]

## 2013-04-24 ENCOUNTER — Telehealth: Payer: Self-pay | Admitting: Obstetrics and Gynecology

## 2013-04-24 DIAGNOSIS — B373 Candidiasis of vulva and vagina: Secondary | ICD-10-CM

## 2013-04-24 MED ORDER — FLUCONAZOLE 150 MG PO TABS: 150.0000 mg | ORAL_TABLET | Freq: Once | ORAL | Status: DC

## 2013-04-24 NOTE — Telephone Encounter (Signed)
Patient c/o yeast infection (yellow d/c, vaginal itch). Per protocol and patient request, Diflucan sent to pharmacy on file.

## 2013-04-27 ENCOUNTER — Encounter: Payer: Medicaid Other | Admitting: Obstetrics and Gynecology

## 2013-04-29 ENCOUNTER — Encounter: Payer: Self-pay | Admitting: *Deleted

## 2013-04-30 ENCOUNTER — Encounter: Payer: Self-pay | Admitting: Medical

## 2013-04-30 ENCOUNTER — Ambulatory Visit (INDEPENDENT_AMBULATORY_CARE_PROVIDER_SITE_OTHER): Payer: Medicaid Other | Admitting: Medical

## 2013-04-30 VITALS — BP 111/75 | HR 86 | Temp 99.2°F | Ht 69.0 in | Wt 184.2 lb

## 2013-04-30 DIAGNOSIS — Z9189 Other specified personal risk factors, not elsewhere classified: Secondary | ICD-10-CM

## 2013-04-30 DIAGNOSIS — Z202 Contact with and (suspected) exposure to infections with a predominantly sexual mode of transmission: Secondary | ICD-10-CM

## 2013-04-30 DIAGNOSIS — B373 Candidiasis of vulva and vagina: Secondary | ICD-10-CM

## 2013-04-30 LAB — POCT URINALYSIS DIP (DEVICE)
Ketones, ur: NEGATIVE mg/dL
Protein, ur: NEGATIVE mg/dL
Specific Gravity, Urine: 1.015 (ref 1.005–1.030)
Urobilinogen, UA: 0.2 mg/dL (ref 0.0–1.0)
pH: 7 (ref 5.0–8.0)

## 2013-04-30 LAB — POCT PREGNANCY, URINE: Preg Test, Ur: NEGATIVE

## 2013-04-30 MED ORDER — TRIAMCINOLONE ACETONIDE 0.025 % EX OINT: TOPICAL_OINTMENT | Freq: Two times a day (BID) | CUTANEOUS | Status: DC

## 2013-04-30 MED ORDER — NYSTATIN 100000 UNIT/GM EX CREA: TOPICAL_CREAM | Freq: Two times a day (BID) | CUTANEOUS | Status: DC

## 2013-04-30 MED ORDER — FLUCONAZOLE 150 MG PO TABS: ORAL_TABLET | ORAL | Status: DC

## 2013-04-30 NOTE — Progress Notes (Signed)
Took diflucan 10/17- no improvement; has gone through 2 boxes of monistat; yeast infection still not any better, very painful and has been very swollen but it has started to go down now.

## 2013-04-30 NOTE — Patient Instructions (Signed)
Monilial Vaginitis Vaginitis in a soreness, swelling and redness (inflammation) of the vagina and vulva. Monilial vaginitis is not a sexually transmitted infection. CAUSES  Yeast vaginitis is caused by yeast (candida) that is normally found in your vagina. With a yeast infection, the candida has overgrown in number to a point that upsets the chemical balance. SYMPTOMS   White, thick vaginal discharge.  Swelling, itching, redness and irritation of the vagina and possibly the lips of the vagina (vulva).  Burning or painful urination.  Painful intercourse. DIAGNOSIS  Things that may contribute to monilial vaginitis are:  Postmenopausal and virginal states.  Pregnancy.  Infections.  Being tired, sick or stressed, especially if you had monilial vaginitis in the past.  Diabetes. Good control will help lower the chance.  Birth control pills.  Tight fitting garments.  Using bubble bath, feminine sprays, douches or deodorant tampons.  Taking certain medications that kill germs (antibiotics).  Sporadic recurrence can occur if you become ill. TREATMENT  Your caregiver will give you medication.  There are several kinds of anti monilial vaginal creams and suppositories specific for monilial vaginitis. For recurrent yeast infections, use a suppository or cream in the vagina 2 times a week, or as directed.  Anti-monilial or steroid cream for the itching or irritation of the vulva may also be used. Get your caregiver's permission.  Painting the vagina with methylene blue solution may help if the monilial cream does not work.  Eating yogurt may help prevent monilial vaginitis. HOME CARE INSTRUCTIONS   Finish all medication as prescribed.  Do not have sex until treatment is completed or after your caregiver tells you it is okay.  Take warm sitz baths.  Do not douche.  Do not use tampons, especially scented ones.  Wear cotton underwear.  Avoid tight pants and panty  hose.  Tell your sexual partner that you have a yeast infection. They should go to their caregiver if they have symptoms such as mild rash or itching.  Your sexual partner should be treated as well if your infection is difficult to eliminate.  Practice safer sex. Use condoms.  Some vaginal medications cause latex condoms to fail. Vaginal medications that harm condoms are:  Cleocin cream.  Butoconazole (Femstat).  Terconazole (Terazol) vaginal suppository.  Miconazole (Monistat) (may be purchased over the counter). SEEK MEDICAL CARE IF:   You have a temperature by mouth above 102 F (38.9 C).  The infection is getting worse after 2 days of treatment.  The infection is not getting better after 3 days of treatment.  You develop blisters in or around your vagina.  You develop vaginal bleeding, and it is not your menstrual period.  You have pain when you urinate.  You develop intestinal problems.  You have pain with sexual intercourse. Document Released: 04/04/2005 Document Revised: 09/17/2011 Document Reviewed: 12/17/2008 ExitCare Patient Information 2014 [employer]. Yeast Infection of the Skin Some yeast on the skin is normal, but sometimes it causes an infection. If you have a yeast infection, it shows up as white or light brown patches on brown skin. You can see it better in the summer on tan skin. It causes light-colored holes in your suntan. It can happen on any area of the body. This cannot be passed from person to person. HOME CARE  Scrub your skin daily with a dandruff shampoo. Your rash may take a couple weeks to get well.  Do not scratch or itch the rash. GET HELP RIGHT AWAY IF:   You   get another infection from scratching. The skin may get warm, red, and may ooze fluid.  The infection does not seem to be getting better. MAKE SURE YOU:  Understand these instructions.  Will watch your condition.  Will get help right away if you are not doing well or  get worse. Document Released: 06/07/2008 Document Revised: 09/17/2011 Document Reviewed: 06/07/2008 ExitCare Patient Information 2014 [employer].  
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
## 2013-05-01 LAB — HIV ANTIBODY (ROUTINE TESTING W REFLEX): HIV: NONREACTIVE

## 2013-05-01 LAB — GC/CHLAMYDIA PROBE AMP
CT Probe RNA: NEGATIVE
GC Probe RNA: NEGATIVE

## 2013-05-01 LAB — HEPATITIS C ANTIBODY: HCV Ab: NEGATIVE

## 2013-05-01 LAB — HEPATITIS B SURFACE ANTIGEN: Hepatitis B Surface Ag: NEGATIVE

## 2013-05-01 LAB — RPR

## 2013-05-01 LAB — WET PREP, GENITAL: Trich, Wet Prep: NONE SEEN

## 2013-05-04 ENCOUNTER — Telehealth: Payer: Self-pay | Admitting: *Deleted

## 2013-05-04 DIAGNOSIS — B373 Candidiasis of vulva and vagina: Secondary | ICD-10-CM

## 2013-05-04 MED ORDER — TRIAMCINOLONE ACETONIDE 0.025 % EX OINT: TOPICAL_OINTMENT | Freq: Two times a day (BID) | CUTANEOUS | Status: DC

## 2013-05-04 NOTE — Telephone Encounter (Signed)
Pt called and stated that she did not get her Kenalog. The pharmacy did not receive it. Rx sent to [facility] per patient request.

## 2013-05-14 ENCOUNTER — Other Ambulatory Visit: Payer: Self-pay

## 2013-05-14 ENCOUNTER — Ambulatory Visit: Payer: Medicaid Other | Admitting: Obstetrics & Gynecology

## 2013-05-15 ENCOUNTER — Ambulatory Visit (INDEPENDENT_AMBULATORY_CARE_PROVIDER_SITE_OTHER): Payer: Medicaid Other | Admitting: Obstetrics & Gynecology

## 2013-05-15 ENCOUNTER — Encounter: Payer: Self-pay | Admitting: Obstetrics & Gynecology

## 2013-05-15 VITALS — BP 114/78 | HR 81 | Temp 98.2°F | Wt 184.8 lb

## 2013-05-15 DIAGNOSIS — Z30431 Encounter for routine checking of intrauterine contraceptive device: Secondary | ICD-10-CM

## 2013-05-15 NOTE — Progress Notes (Signed)
Pt. States she believes her yeast infection has finally cleared up but she still has some itching/irritation. Would like to be checked to make sure she no longer has a yeast infection along with checking IUD.

## 2013-05-15 NOTE — Progress Notes (Signed)
GYNECOLOGY CLINIC ENCOUNTER NOTE  History:  27 y.o. [id number] here today for Mirena IUD check up after placement on 04/16/13.  Also has resolving yeast infection.  The following portions of the patient's history were reviewed and updated as appropriate: allergies, current medications, past family history, past medical history, past social history, past surgical history and problem list. 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   Review of Systems:  Pertinent items are noted in HPI.  Objective:  Physical Exam BP 114/78  Pulse 81  Temp(Src) 98.2 F (36.8 C) (Oral)  Wt 184 lb 12.8 oz (83.825 kg)  LMP 05/09/2013 Gen: NAD Abd: Soft, nontender and nondistended Pelvic: Normal appearing external genitalia; normal appearing vaginal mucosa and cervix. IUD strings seen. Clear discharge seen.  Small uterus, no other palpable masses, no uterine or adnexal tenderness  Assessment & Plan:  IUD in place Patient needs to come back for colposcopy

## 2013-05-28 ENCOUNTER — Telehealth: Payer: Self-pay | Admitting: *Deleted

## 2013-05-28 DIAGNOSIS — B9689 Other specified bacterial agents as the cause of diseases classified elsewhere: Secondary | ICD-10-CM

## 2013-05-28 DIAGNOSIS — B373 Candidiasis of vulva and vagina: Secondary | ICD-10-CM

## 2013-05-28 MED ORDER — FLUCONAZOLE 150 MG PO TABS: ORAL_TABLET | ORAL | Status: DC

## 2013-05-28 MED ORDER — METRONIDAZOLE 500 MG PO TABS: 500.0000 mg | ORAL_TABLET | Freq: Two times a day (BID) | ORAL | Status: AC

## 2013-05-28 NOTE — Telephone Encounter (Signed)
Patient called with complaints of yeast and bv. She has itching as well as thin discharge with odor.

## 2013-06-19 ENCOUNTER — Encounter: Payer: Medicaid Other | Admitting: Obstetrics & Gynecology

## 2013-07-06 DIAGNOSIS — J4520 Mild intermittent asthma, uncomplicated: Secondary | ICD-10-CM | POA: Insufficient documentation

## 2013-07-30 ENCOUNTER — Ambulatory Visit (INDEPENDENT_AMBULATORY_CARE_PROVIDER_SITE_OTHER): Payer: Medicaid Other | Admitting: Obstetrics & Gynecology

## 2013-07-30 ENCOUNTER — Other Ambulatory Visit (HOSPITAL_COMMUNITY)
Admission: RE | Admit: 2013-07-30 | Discharge: 2013-07-30 | Disposition: A | Discharge status: Home / Self Care | Payer: Medicaid Other | Source: Ambulatory Visit | Attending: Obstetrics & Gynecology | Admitting: Obstetrics & Gynecology

## 2013-07-30 ENCOUNTER — Encounter: Payer: Self-pay | Admitting: Obstetrics & Gynecology

## 2013-07-30 VITALS — BP 114/75 | HR 78 | Temp 97.6°F | Ht 69.0 in | Wt 183.8 lb

## 2013-07-30 DIAGNOSIS — R87612 Low grade squamous intraepithelial lesion on cytologic smear of cervix (LGSIL): Secondary | ICD-10-CM

## 2013-07-30 DIAGNOSIS — R87619 Unspecified abnormal cytological findings in specimens from cervix uteri: Secondary | ICD-10-CM

## 2013-07-30 DIAGNOSIS — Z30433 Encounter for removal and reinsertion of intrauterine contraceptive device: Secondary | ICD-10-CM

## 2013-07-30 DIAGNOSIS — T8332XA Displacement of intrauterine contraceptive device, initial encounter: Secondary | ICD-10-CM

## 2013-07-30 DIAGNOSIS — T8389XA Other specified complication of genitourinary prosthetic devices, implants and grafts, initial encounter: Secondary | ICD-10-CM

## 2013-07-30 DIAGNOSIS — N870 Mild cervical dysplasia: Secondary | ICD-10-CM | POA: Insufficient documentation

## 2013-07-30 LAB — POCT PREGNANCY, URINE: PREG TEST UR: NEGATIVE

## 2013-07-30 MED ORDER — LEVONORGESTREL 20 MCG/24HR IU IUD
INTRAUTERINE_SYSTEM | Freq: Once | INTRAUTERINE | Status: AC
  Administered 2013-07-30: 14:00:00 1 via INTRAUTERINE

## 2013-07-30 NOTE — Patient Instructions (Signed)
Colposcopy, Care After  Refer to this sheet in the next few weeks. These instructions provide you with information on caring for yourself after your procedure. Your health care provider may also give you more specific instructions. Your treatment has been planned according to current medical practices, but problems sometimes occur. Call your health care provider if you have any problems or questions after your procedure.  WHAT TO EXPECT AFTER THE PROCEDURE   After your procedure, it is typical to have the following:  · Cramping. This often goes away in a few minutes.  · Soreness. This may last for 2 days.  · Lightheadedness. Lie down for a few minutes if this occurs.  You may also have some bleeding or dark discharge for a few days. You may need to wear a sanitary pad during this time.  HOME CARE INSTRUCTIONS  · Avoid sex, douching, and using tampons for 3 days or as directed by your health care provider.  · Only take over-the-counter or prescription medicines as directed by your health care provider. Do not take aspirin because it can cause bleeding.  · Continue to take birth control pills if you are on them.  · Not all test results are available during your visit. If your test results are not back during the visit, make an appointment with your health care provider to find out the results. Do not assume everything is normal if you have not heard from your health care provider or the medical facility. It is important for you to follow up on all of your test results.  · Follow your health care provider's advice regarding activity, follow-up visits, and follow-up Pap tests.  SEEK MEDICAL CARE IF:  · You develop a rash.  · You have problems with your medicine.  SEEK IMMEDIATE MEDICAL CARE IF:  · You are bleeding heavily or are passing blood clots.  · You have a fever.  · You have abnormal vaginal discharge.  · You are having cramps that do not go away after taking your pain medicine.  · You feel lightheaded, dizzy, or  faint.  · You have stomach pain.  Document Released: 04/15/2013 Document Reviewed: 01/22/2013  ExitCare® Patient Information ©2014 [employer].

## 2013-07-30 NOTE — Progress Notes (Signed)
    GYNECOLOGY CLINIC PROCEDURE NOTE  Colposocopy Christie [name] is a 28 y.o. [id number] here for colposcopy for low-grade squamous intraepithelial neoplasia (LGSIL - encompassing HPV,mild dysplasia,CIN I) pap smear on 02/27/13. Discussed role for HPV in cervical dysplasia, need for surveillance.  Patient given informed consent, signed copy in the chart, time out was performed.  Placed in lithotomy position. Cervix viewed with speculum and colposcope after application of acetic acid.   Colposcopy adequate? Yes Acetowhite lesion with possible punctuation noted at 11 o'clock ; biopsy obtained.  ECC specimen obtained. All specimens were labelled and sent to pathology; will follow up results and manage accordingly.   During the pelvic exam, patient's IUD was noted to be extruding from the cervical os.  This is the second expulsion of a Mirena IUD for this patient. She denies the use of any toys or sexual aids, does not pull the strings. She desires removal and reinsertion.    IUD Removal and Reinsertion  Patient identified, informed consent performed. Discussed risks of irregular bleeding, cramping, infection, expulsion of IUD, malpositioning or misplacement of the IUD outside the uterus which may require further procedures.  Time out was performed. Speculum placed in the vagina. The strings of the IUD were grasped and pulled using ring forceps. The IUD was successfully removed in its entirety. The cervix was cleaned with Betadine x 2 and grasped anteriorly with a single tooth tenaculum.  The new Mirena IUD insertion apparatus was used to sound the uterus to 9 cm;  the IUD was then placed per manufacturer's recommendations. Strings trimmed to 3 cm. Tenaculum was removed, good hemostasis noted. Patient tolerated procedure well.   Patient was given post-procedure instructions after the colposcopy and IUD reinsertion.   Patient will follow up in 4 weeks for IUD check and she will called with colposcopy  results.    Jaynie CollinsUGONNA  Christie Delaughter, MD, FACOG Attending Obstetrician & Gynecologist Faculty Practice, [facility]

## 2013-08-03 ENCOUNTER — Encounter: Payer: Self-pay | Admitting: Obstetrics & Gynecology

## 2013-08-03 DIAGNOSIS — N871 Moderate cervical dysplasia: Secondary | ICD-10-CM | POA: Insufficient documentation

## 2013-08-06 ENCOUNTER — Encounter: Payer: Self-pay | Admitting: *Deleted

## 2013-08-21 ENCOUNTER — Other Ambulatory Visit: Payer: Self-pay | Admitting: Family

## 2013-08-21 MED ORDER — METRONIDAZOLE 500 MG PO TABS: 500.0000 mg | ORAL_TABLET | Freq: Two times a day (BID) | ORAL | Status: DC

## 2013-08-24 ENCOUNTER — Telehealth: Payer: Self-pay | Admitting: *Deleted

## 2013-08-24 DIAGNOSIS — B3731 Acute candidiasis of vulva and vagina: Secondary | ICD-10-CM

## 2013-08-24 DIAGNOSIS — B373 Candidiasis of vulva and vagina: Secondary | ICD-10-CM

## 2013-08-24 MED ORDER — FLUCONAZOLE 150 MG PO TABS: 150.0000 mg | ORAL_TABLET | Freq: Once | ORAL | Status: DC

## 2013-08-24 NOTE — Telephone Encounter (Signed)
Christie SaucierKinyetta called requesting a refill on diflucan for recurrent yeast- states has same burning feeling as when she has had yeast before- states always takes 2 tabs to clear yeast. Refill for 2 tabs approved by provider and sent to her pharmacy.

## 2013-08-31 ENCOUNTER — Encounter: Payer: Self-pay | Admitting: Obstetrics & Gynecology

## 2013-08-31 ENCOUNTER — Ambulatory Visit (INDEPENDENT_AMBULATORY_CARE_PROVIDER_SITE_OTHER): Payer: Medicaid Other | Admitting: Obstetrics & Gynecology

## 2013-08-31 VITALS — BP 106/74 | HR 67 | Temp 97.9°F | Ht 69.0 in | Wt 189.7 lb

## 2013-08-31 DIAGNOSIS — T8332XA Displacement of intrauterine contraceptive device, initial encounter: Secondary | ICD-10-CM

## 2013-08-31 DIAGNOSIS — Z30432 Encounter for removal of intrauterine contraceptive device: Secondary | ICD-10-CM

## 2013-08-31 DIAGNOSIS — Z309 Encounter for contraceptive management, unspecified: Secondary | ICD-10-CM

## 2013-08-31 DIAGNOSIS — Z30431 Encounter for routine checking of intrauterine contraceptive device: Secondary | ICD-10-CM

## 2013-08-31 LAB — POCT PREGNANCY, URINE: PREG TEST UR: NEGATIVE

## 2013-08-31 MED ORDER — NORETHINDRONE-ETH ESTRADIOL 1-35 MG-MCG PO TABS: 1.0000 | ORAL_TABLET | Freq: Every day | ORAL | Status: DC

## 2013-08-31 NOTE — Progress Notes (Signed)
   CLINIC ENCOUNTER NOTE  History:  28 y.o. [id number] here today for IUD check after placement of Mirena on 07/30/13.  She also had colposcopy the same day for LGSIL pap smear, pathology showed CIN I. She thinks she is expelling the IUD for the third time, noted lengthening of strings.  Of note, patient had expelled Mirena IUD twice prior to this replacement.  The following portions of the patient's history were reviewed and updated as appropriate: allergies, current medications, past family history, past medical history, past social history, past surgical history and problem list. LGSIL pap on 02/27/13.  Review of Systems:  Pertinent items are noted in HPI.  Objective:  Physical Exam BP 106/74  Pulse 67  Temp(Src) 97.9 F (36.6 C) (Oral)  Ht 5\' 9"  (1.753 m)  Wt 189 lb 11.2 oz (86.047 kg)  BMI 28.00 kg/m2  LMP 07/31/2013 Gen: NAD Abd: Soft, nontender and nondistended Pelvic: Normal appearing external genitalia; normal appearing vaginal mucosa and cervix. IUD strings visualized, total length of strings outside the cervix. IUD noted in cervix, this was removed after grasping the strings with a Kelly forceps.  Colposcopy Pathology (07/30/13) 1. Cervix, biopsy, 11 o'clock - LOW GRADE SQUAMOUS INTRAEPITHELIAL LESION, CIN-I (MILD DYSPLASIA). 2. Endocervix, curettage - BENIGN ENDOCERVICAL MUCOSA.  Assessment & Plan:  IUD expelled for the third time, removed from cervix today.  Other BCM methods discussed; patient does not want Nexplanon, Nuvaring or Depo Provera. Prefers to try pills.  OCPs prescribed. Will repeat pap smear in one year (07/2014), for surveillance of CIN I Routine preventative health maintenance measures emphasized   Jaynie CollinsUGONNA  Ryler Laskowski, MD, FACOG Attending Obstetrician & Gynecologist Faculty Practice, [facility]

## 2013-08-31 NOTE — Patient Instructions (Signed)
Return to clinic for any scheduled appointments or for any gynecologic concerns as needed.   

## 2013-09-21 ENCOUNTER — Telehealth: Payer: Self-pay | Admitting: *Deleted

## 2013-09-21 DIAGNOSIS — B373 Candidiasis of vulva and vagina: Secondary | ICD-10-CM

## 2013-09-21 DIAGNOSIS — B3731 Acute candidiasis of vulva and vagina: Secondary | ICD-10-CM

## 2013-09-21 MED ORDER — FLUCONAZOLE 150 MG PO TABS: 150.0000 mg | ORAL_TABLET | Freq: Once | ORAL | Status: DC

## 2013-09-21 NOTE — Telephone Encounter (Signed)
Patient states that she is having symptoms of yeast infection again itching discharge and irritation. Patient has recurrent yeast infections. I advised her that I can call in diflucan again and that she needs to be seen prior to any more medicine. Patient agreeable to this.

## 2013-10-05 ENCOUNTER — Ambulatory Visit (INDEPENDENT_AMBULATORY_CARE_PROVIDER_SITE_OTHER): Payer: Medicaid Other | Admitting: Obstetrics & Gynecology

## 2013-10-05 ENCOUNTER — Encounter: Payer: Self-pay | Admitting: Obstetrics & Gynecology

## 2013-10-05 VITALS — BP 103/76 | HR 96 | Ht 69.0 in | Wt 185.4 lb

## 2013-10-05 DIAGNOSIS — B373 Candidiasis of vulva and vagina: Secondary | ICD-10-CM

## 2013-10-05 DIAGNOSIS — B3731 Acute candidiasis of vulva and vagina: Secondary | ICD-10-CM

## 2013-10-05 DIAGNOSIS — Z309 Encounter for contraceptive management, unspecified: Secondary | ICD-10-CM

## 2013-10-05 MED ORDER — FLUCONAZOLE 150 MG PO TABS: 150.0000 mg | ORAL_TABLET | Freq: Once | ORAL | Status: DC

## 2013-10-05 MED ORDER — ETONOGESTREL-ETHINYL ESTRADIOL 0.12-0.015 MG/24HR VA RING: VAGINAL_RING | VAGINAL | Status: DC

## 2013-10-05 NOTE — Progress Notes (Signed)
   CLINIC ENCOUNTER NOTE  History:  28 y.o. [id number] here today for treatment of vaginal discharge. Reports symptoms similar to previous episodes of yeast infections; gets these occasionally and she attributes this to her Type I DM. Currently on menstrual period.   Also wants to try Nuvaring for contraception; currently on OCPs.  Reports increased volume and duration of bleeding on the OCPs.  No anemia symptoms.  The following portions of the patient's history were reviewed and updated as appropriate: allergies, current medications, past family history, past medical history, past social history, past surgical history and problem list. LGSIL pap smear in 02/2013, colposcopy pathology showed CIN I, needs repeat pap in 07/2014.   Review of Systems:  Pertinent items are noted in HPI.  Objective:  Physical Exam BP 103/76  Pulse 96  Ht 5\' 9"  (1.753 m)  Wt 185 lb 6.4 oz (84.097 kg)  BMI 27.37 kg/m2  LMP 09/25/2013 Gen: NAD Abd: Soft, nontender and nondistended Pelvic: Normal appearing external genitalia; normal appearing vaginal mucosa and cervix.  Moderate amount of blood seen in vault, no discharge.  Small uterus, no other palpable masses, no uterine or adnexal tenderness   Assessment & Plan:  Diflucan presumptively prescribed Nuvaring prescribed; to be used after OCP taper for control of current increased bleeding Routine preventative health maintenance measures emphasized.   Jaynie CollinsUGONNA  Timmya Blazier, MD, FACOG Attending Obstetrician & Gynecologist Faculty Practice, [facility]

## 2013-10-05 NOTE — Patient Instructions (Signed)
Return to clinic for any scheduled appointments or for any gynecologic concerns as needed.   

## 2013-10-05 NOTE — Progress Notes (Signed)
She is certain that she has a yeast infection, she has them often. She also has been on her cycle for 2 weeks and is passing clots. She reports that she has missed a few pills but no major gaps in the pack of pills.  She is interested in nuva ring

## 2013-10-16 ENCOUNTER — Ambulatory Visit: Payer: Medicaid Other | Admitting: Medical

## 2013-11-25 ENCOUNTER — Ambulatory Visit: Payer: Medicaid Other | Admitting: Obstetrics & Gynecology

## 2013-11-27 ENCOUNTER — Encounter (HOSPITAL_COMMUNITY): Payer: Self-pay | Admitting: *Deleted

## 2013-11-27 ENCOUNTER — Inpatient Hospital Stay (HOSPITAL_COMMUNITY)
Admission: AD | Admit: 2013-11-27 | Discharge: 2013-11-27 | Disposition: A | Discharge status: Home / Self Care | Payer: Medicaid Other | Source: Ambulatory Visit | Attending: Obstetrics & Gynecology | Admitting: Obstetrics & Gynecology

## 2013-11-27 DIAGNOSIS — Z794 Long term (current) use of insulin: Secondary | ICD-10-CM | POA: Insufficient documentation

## 2013-11-27 DIAGNOSIS — E109 Type 1 diabetes mellitus without complications: Secondary | ICD-10-CM | POA: Insufficient documentation

## 2013-11-27 DIAGNOSIS — N946 Dysmenorrhea, unspecified: Secondary | ICD-10-CM

## 2013-11-27 DIAGNOSIS — R109 Unspecified abdominal pain: Secondary | ICD-10-CM | POA: Insufficient documentation

## 2013-11-27 LAB — URINALYSIS, ROUTINE W REFLEX MICROSCOPIC
BILIRUBIN URINE: NEGATIVE
Glucose, UA: NEGATIVE mg/dL
KETONES UR: 15 mg/dL — AB
Leukocytes, UA: NEGATIVE
NITRITE: NEGATIVE
PROTEIN: NEGATIVE mg/dL
Specific Gravity, Urine: >1.03 — ABNORMAL HIGH (ref 1.005–1.030)
UROBILINOGEN UA: 0.2 mg/dL (ref 0.0–1.0)
pH: 6 (ref 5.0–8.0)

## 2013-11-27 LAB — POCT PREGNANCY, URINE: Preg Test, Ur: NEGATIVE

## 2013-11-27 LAB — URINE MICROSCOPIC-ADD ON

## 2013-11-27 NOTE — MAU Provider Note (Signed)
Attestation of Attending Supervision of Advanced Practitioner (CNM/NP): Evaluation and management procedures were performed by the Advanced Practitioner under my supervision and collaboration.  I have reviewed the Advanced Practitioner's note and chart, and I agree with the management and plan.  Jayceion Lisenby Harraway-Smith 8:46 PM     

## 2013-11-27 NOTE — MAU Provider Note (Signed)
CC: Abdominal Pain    First Provider Initiated Contact with Patient 11/27/13 1922      HPI Christie [name] is a 28 y.o. [phone]  who placed Nuvaring in vagina 2 weeks ago and has not been able to feel it this week. Did not notice it falling out. Using Nuvaring since March 2015. Had intercourse once with it in place. Cramping since 11/22/13 and onset menses 11/25/13. Took one Aleve this am for cramps with relief. Mild cramping now, but declines pain med. Also declines STI testing as she is in mutually monogamous relationship for past several years. Has hx dysmenorrhea and irregular cycle interval (few days variation each month). Hx LGSIL 2014 and CIN on colpo, needs Pap in 07/2014.  Reports improved control of DM since Lantus increased 2 wks ago.   Past Medical History  Diagnosis Date  . Asthma   . Hx of migraines   . Anemia   . Depression   . Fx ankle     history of left ankle fx at age 28  . Pregnancy induced hypertension   . Diabetes mellitus     type 1 on insulin  . Headache(784.0)   . Urinary tract infection   . Chlamydia   . Abnormal Pap smear     colpo scheduled  . Anxiety     OB History  Gravida Para Term Preterm AB SAB TAB Ectopic Multiple Living  3 2 2  0 1 0 1 0 0 2    # Outcome Date GA Lbr Len/2nd Weight Sex Delivery Anes PTL Lv  3 TAB 12/27/11 [id number]            Comments: no complications  2 TRM 09/12/07   4.252 kg (9 lb 6 oz) M SVD EPI N Y     Comments: pre eclampsia  1 TRM     M SVD   Y      Past Surgical History  Procedure Laterality Date  . Wisdom tooth extraction    . Colposcopy      History   Social History  . Marital Status: Single    Spouse Name: N/A    Number of Children: N/A  . Years of Education: N/A   Occupational History  . Not on file.   Social History Main Topics  . Smoking status: Never Smoker   . Smokeless tobacco: Never Used  . Alcohol Use: Yes     Comment: social drinker  . Drug Use: No     Comment: last used 1 years ago   . Sexual Activity: Yes    Birth Control/ Protection: IUD   Other Topics Concern  . Not on file   Social History Narrative  . No narrative on file    No current facility-administered medications on file prior to encounter.   Current Outpatient Prescriptions on File Prior to Encounter  Medication Sig Dispense Refill  . etonogestrel-ethinyl estradiol (NUVARING) 0.12-0.015 MG/24HR vaginal ring Insert vaginally and leave in place for 3 consecutive weeks, then remove for 1 week.  1 each  12  . fluconazole (DIFLUCAN) 150 MG tablet Take 1 tablet (150 mg total) by mouth once.  3 tablet  3  . insulin aspart (NOVOLOG) 100 UNIT/ML injection Inject 1-9 Units into the skin 3 (three) times daily with meals.      . insulin glargine (LANTUS) 100 UNIT/ML injection Inject 20 Units into the skin daily with breakfast.        Allergies  Allergen Reactions  .  Banana Anaphylaxis  . Citrus Other (See Comments)    Gums bleed  . Adhesive [Tape] Rash    ROS Pertinent items in HPI  PHYSICAL EXAM Filed Vitals:   11/27/13 1905  BP: 116/73  Pulse: 85  Temp: 98.5 F (36.9 C)  Resp: 18   General: Well nourished, well developed female in no acute distress Cardiovascular: Normal rate Respiratory: Normal effort Abdomen: Soft, nontender throughout Back: No CVAT Extremities: No edema Neurologic: Alert and oriented Speculum exam: NEFG; vagina with physiologic discharge, menstrual blood; cervix clean, no Nuvaring Bimanual exam: no CMT; uterus NSSP and NT; no Nuvaring present; no adnexal tenderness or masses   LAB RESULTS Results for orders placed during the hospital encounter of 11/27/13 (from the past 24 hour(s))  URINALYSIS, ROUTINE W REFLEX MICROSCOPIC     Status: Abnormal   Collection Time    11/27/13  6:55 PM      Result Value Ref Range   Color, Urine YELLOW  YELLOW   APPearance HAZY (*) CLEAR   Specific Gravity, Urine >1.030 (*) 1.005 - 1.030   pH 6.0  5.0 - 8.0   Glucose, UA NEGATIVE   NEGATIVE mg/dL   Hgb urine dipstick LARGE (*) NEGATIVE   Bilirubin Urine NEGATIVE  NEGATIVE   Ketones, ur 15 (*) NEGATIVE mg/dL   Protein, ur NEGATIVE  NEGATIVE mg/dL   Urobilinogen, UA 0.2  0.0 - 1.0 mg/dL   Nitrite NEGATIVE  NEGATIVE   Leukocytes, UA NEGATIVE  NEGATIVE  URINE MICROSCOPIC-ADD ON     Status: Abnormal   Collection Time    11/27/13  6:55 PM      Result Value Ref Range   Squamous Epithelial / LPF FEW (*) RARE   WBC, UA 3-6  <3 WBC/hpf   RBC / HPF TOO NUMEROUS TO COUNT  <3 RBC/hpf   Bacteria, UA FEW (*) RARE  POCT PREGNANCY, URINE     Status: None   Collection Time    11/27/13  7:00 PM      Result Value Ref Range   Preg Test, Ur NEGATIVE  NEGATIVE    IMAGING No results found.  MAU COURSE   ASSESSMENT  1. Dysmenorrhea   2. Diabetes type 1, controlled     PLAN Discharge home with reassurance that Nuvaring was expelled. Reviewed placement technique. If this recurs may need alternate contraceptive method .  See AVS for patient education.    Medication List    STOP taking these medications       fluconazole 150 MG tablet  Commonly known as:  DIFLUCAN      TAKE these medications       etonogestrel-ethinyl estradiol 0.12-0.015 MG/24HR vaginal ring  Commonly known as:  NUVARING  Insert vaginally and leave in place for 3 consecutive weeks, then remove for 1 week.     insulin aspart 100 UNIT/ML injection  Commonly known as:  novoLOG  Inject 1-9 Units into the skin 3 (three) times daily with meals.     insulin glargine 100 UNIT/ML injection  Commonly known as:  LANTUS  Inject 20 Units into the skin daily with breakfast.       Follow-up Information   Follow up with [facility]. (Keep your scheduled appointment)    Contact information:   [address] [place] [address] [phone]        Danae Orleans, CNM 11/27/2013 7:33 PM

## 2013-11-27 NOTE — MAU Note (Signed)
Patient states she lost her Nuvaring on 5-17 Not sure if it came out or just can't find it. States she started cramping on 5-17.

## 2013-11-27 NOTE — Discharge Instructions (Signed)

## 2013-12-24 ENCOUNTER — Telehealth: Payer: Self-pay

## 2013-12-24 DIAGNOSIS — B373 Candidiasis of vulva and vagina: Secondary | ICD-10-CM

## 2013-12-24 DIAGNOSIS — B3731 Acute candidiasis of vulva and vagina: Secondary | ICD-10-CM

## 2013-12-24 MED ORDER — FLUCONAZOLE 150 MG PO TABS: 150.0000 mg | ORAL_TABLET | Freq: Once | ORAL | Status: DC

## 2013-12-24 NOTE — Telephone Encounter (Signed)
Patient called stating she has a yeast infection-- white itchy discharge, and has them often. Informed patient I can prescribe Diflucan and for her to call if symptoms persist for longer than 3 days after treatment. Patient verbalized understanding.

## 2014-01-12 ENCOUNTER — Telehealth: Payer: Self-pay

## 2014-01-12 DIAGNOSIS — B379 Candidiasis, unspecified: Secondary | ICD-10-CM

## 2014-01-12 MED ORDER — FLUCONAZOLE 150 MG PO TABS: 150.0000 mg | ORAL_TABLET | Freq: Once | ORAL | Status: DC

## 2014-01-12 NOTE — Telephone Encounter (Signed)
Patient called stating she has another yeast infection and would like Diflucan prescribed. Informed patient I would prescribe Diflucan but that she should make an appointment to be seen for having frequent yeast infections. Front desk informed to schedule patient for appointment. Diflucan RX.

## 2014-01-19 ENCOUNTER — Encounter (HOSPITAL_BASED_OUTPATIENT_CLINIC_OR_DEPARTMENT_OTHER): Payer: Self-pay | Admitting: Emergency Medicine

## 2014-01-19 ENCOUNTER — Inpatient Hospital Stay (HOSPITAL_BASED_OUTPATIENT_CLINIC_OR_DEPARTMENT_OTHER)
Admission: EM | Admit: 2014-01-19 | Discharge: 2014-01-21 | DRG: 639 | Disposition: A | Discharge status: Home / Self Care | Payer: Medicaid Other | Attending: Family Medicine | Admitting: Family Medicine

## 2014-01-19 DIAGNOSIS — Z833 Family history of diabetes mellitus: Secondary | ICD-10-CM

## 2014-01-19 DIAGNOSIS — R110 Nausea: Secondary | ICD-10-CM | POA: Diagnosis present

## 2014-01-19 DIAGNOSIS — R109 Unspecified abdominal pain: Secondary | ICD-10-CM | POA: Diagnosis present

## 2014-01-19 DIAGNOSIS — F329 Major depressive disorder, single episode, unspecified: Secondary | ICD-10-CM | POA: Diagnosis present

## 2014-01-19 DIAGNOSIS — Z794 Long term (current) use of insulin: Secondary | ICD-10-CM | POA: Diagnosis not present

## 2014-01-19 DIAGNOSIS — E1310 Other specified diabetes mellitus with ketoacidosis without coma: Secondary | ICD-10-CM

## 2014-01-19 DIAGNOSIS — J45909 Unspecified asthma, uncomplicated: Secondary | ICD-10-CM | POA: Diagnosis present

## 2014-01-19 DIAGNOSIS — N870 Mild cervical dysplasia: Secondary | ICD-10-CM

## 2014-01-19 DIAGNOSIS — E1010 Type 1 diabetes mellitus with ketoacidosis without coma: Principal | ICD-10-CM | POA: Diagnosis present

## 2014-01-19 DIAGNOSIS — F3289 Other specified depressive episodes: Secondary | ICD-10-CM | POA: Diagnosis present

## 2014-01-19 DIAGNOSIS — D649 Anemia, unspecified: Secondary | ICD-10-CM | POA: Diagnosis present

## 2014-01-19 DIAGNOSIS — E0810 Diabetes mellitus due to underlying condition with ketoacidosis without coma: Secondary | ICD-10-CM

## 2014-01-19 DIAGNOSIS — R87612 Low grade squamous intraepithelial lesion on cytologic smear of cervix (LGSIL): Secondary | ICD-10-CM

## 2014-01-19 LAB — CBC WITH DIFFERENTIAL/PLATELET
Basophils Absolute: 0 10*3/uL (ref 0.0–0.1)
Basophils Relative: 0 % (ref 0–1)
EOS PCT: 2 % (ref 0–5)
Eosinophils Absolute: 0.1 10*3/uL (ref 0.0–0.7)
HCT: 38.5 % (ref 36.0–46.0)
HEMOGLOBIN: 12.4 g/dL (ref 12.0–15.0)
LYMPHS PCT: 49 % — AB (ref 12–46)
Lymphs Abs: 2.1 10*3/uL (ref 0.7–4.0)
MCH: 22.2 pg — ABNORMAL LOW (ref 26.0–34.0)
MCHC: 32.2 g/dL (ref 30.0–36.0)
MCV: 68.9 fL — ABNORMAL LOW (ref 78.0–100.0)
MONO ABS: 0.4 10*3/uL (ref 0.1–1.0)
Monocytes Relative: 10 % (ref 3–12)
NEUTROS PCT: 39 % — AB (ref 43–77)
Neutro Abs: 1.6 10*3/uL — ABNORMAL LOW (ref 1.7–7.7)
Platelets: 204 10*3/uL (ref 150–400)
RBC: 5.59 MIL/uL — AB (ref 3.87–5.11)
RDW: 17.6 % — ABNORMAL HIGH (ref 11.5–15.5)
WBC: 4.2 10*3/uL (ref 4.0–10.5)

## 2014-01-19 LAB — URINALYSIS, ROUTINE W REFLEX MICROSCOPIC
Bilirubin Urine: NEGATIVE
Hgb urine dipstick: NEGATIVE
Ketones, ur: NEGATIVE mg/dL
LEUKOCYTES UA: NEGATIVE
Nitrite: NEGATIVE
PH: 5.5 (ref 5.0–8.0)
Protein, ur: NEGATIVE mg/dL
SPECIFIC GRAVITY, URINE: 1.033 — AB (ref 1.005–1.030)
Urobilinogen, UA: 0.2 mg/dL (ref 0.0–1.0)

## 2014-01-19 LAB — HEPATIC FUNCTION PANEL
ALT: 10 U/L (ref 0–35)
AST: 16 U/L (ref 0–37)
Albumin: 3.8 g/dL (ref 3.5–5.2)
Alkaline Phosphatase: 71 U/L (ref 39–117)
Bilirubin, Direct: <0.2 mg/dL (ref 0.0–0.3)
TOTAL PROTEIN: 8.2 g/dL (ref 6.0–8.3)
Total Bilirubin: 0.3 mg/dL (ref 0.3–1.2)

## 2014-01-19 LAB — I-STAT VENOUS BLOOD GAS, ED
ACID-BASE DEFICIT: 4 mmol/L — AB (ref 0.0–2.0)
Bicarbonate: 21.4 mEq/L (ref 20.0–24.0)
O2 Saturation: 70 %
PCO2 VEN: 39.8 mmHg — AB (ref 45.0–50.0)
PO2 VEN: 39 mmHg (ref 30.0–45.0)
Patient temperature: 98.3
TCO2: 23 mmol/L (ref 0–100)
pH, Ven: 7.338 — ABNORMAL HIGH (ref 7.250–7.300)

## 2014-01-19 LAB — BASIC METABOLIC PANEL
Anion gap: 20 — ABNORMAL HIGH (ref 5–15)
BUN: 13 mg/dL (ref 6–23)
CHLORIDE: 84 meq/L — AB (ref 96–112)
CO2: 20 meq/L (ref 19–32)
Calcium: 10.2 mg/dL (ref 8.4–10.5)
Creatinine, Ser: 0.8 mg/dL (ref 0.50–1.10)
GFR calc Af Amer: >90 mL/min (ref >90)
GFR calc non Af Amer: >90 mL/min (ref >90)
GLUCOSE: 859 mg/dL — AB (ref 70–99)
Potassium: 4.6 mEq/L (ref 3.7–5.3)
Sodium: 124 mEq/L — ABNORMAL LOW (ref 137–147)

## 2014-01-19 LAB — CBG MONITORING, ED
GLUCOSE-CAPILLARY: 574 mg/dL — AB (ref 70–99)
Glucose-Capillary: >600 mg/dL (ref 70–99)

## 2014-01-19 LAB — URINE MICROSCOPIC-ADD ON

## 2014-01-19 LAB — PREGNANCY, URINE: PREG TEST UR: NEGATIVE

## 2014-01-19 LAB — GLUCOSE, CAPILLARY: Glucose-Capillary: 379 mg/dL — ABNORMAL HIGH (ref 70–99)

## 2014-01-19 MED ORDER — INSULIN REGULAR HUMAN 100 UNIT/ML IJ SOLN
INTRAMUSCULAR | Status: AC
  Filled 2014-01-19: qty 1

## 2014-01-19 MED ORDER — DEXTROSE 50 % IV SOLN: 25.0000 mL | INTRAVENOUS | Status: DC | PRN

## 2014-01-19 MED ORDER — SODIUM CHLORIDE 0.9 % IV BOLUS (SEPSIS)
1000.0000 mL | Freq: Once | INTRAVENOUS | Status: AC
  Administered 2014-01-19: 1000 mL via INTRAVENOUS

## 2014-01-19 MED ORDER — SODIUM CHLORIDE 0.9 % IV SOLN
INTRAVENOUS | Status: DC
Start: 2014-01-19 — End: 2014-01-21
  Administered 2014-01-19: 150 mL/h via INTRAVENOUS

## 2014-01-19 MED ORDER — ENOXAPARIN SODIUM 40 MG/0.4ML ~~LOC~~ SOLN
40.0000 mg | SUBCUTANEOUS | Status: DC
  Administered 2014-01-20: 40 mg via SUBCUTANEOUS
  Filled 2014-01-19 (×3): qty 0.4

## 2014-01-19 MED ORDER — SODIUM CHLORIDE 0.9 % IV SOLN: INTRAVENOUS | Status: DC

## 2014-01-19 MED ORDER — DEXTROSE-NACL 5-0.45 % IV SOLN
INTRAVENOUS | Status: DC
Start: 2014-01-19 — End: 2014-01-21
  Administered 2014-01-19: 100 mL/h via INTRAVENOUS

## 2014-01-19 MED ORDER — DEXTROSE-NACL 5-0.45 % IV SOLN
INTRAVENOUS | Status: DC
  Administered 2014-01-19: 20:00:00 via INTRAVENOUS

## 2014-01-19 MED ORDER — INSULIN GLARGINE 100 UNIT/ML ~~LOC~~ SOLN
30.0000 [IU] | Freq: Once | SUBCUTANEOUS | Status: AC
  Administered 2014-01-19: 30 [IU] via SUBCUTANEOUS

## 2014-01-19 MED ORDER — INSULIN GLARGINE 100 UNIT/ML ~~LOC~~ SOLN
SUBCUTANEOUS | Status: AC
  Filled 2014-01-19: qty 1

## 2014-01-19 MED ORDER — POTASSIUM CHLORIDE 10 MEQ/100ML IV SOLN
10.0000 meq | INTRAVENOUS | Status: AC
  Administered 2014-01-19 – 2014-01-20 (×2): 10 meq via INTRAVENOUS
  Filled 2014-01-19 (×2): qty 100

## 2014-01-19 MED ORDER — SODIUM CHLORIDE 0.9 % IV SOLN
INTRAVENOUS | Status: DC
  Administered 2014-01-19: 5.4 [IU]/h via INTRAVENOUS

## 2014-01-19 NOTE — ED Provider Notes (Signed)
CSN: [id number]     Arrival date & time 01/19/14  1753 History  This chart was scribed for Shon Baton, MD by Evon Slack, ED Scribe. This patient was seen in room MH04/MH04 and the patient's care was started at 6:13 PM.    Chief Complaint  Patient presents with  . Diabetes  . Dysuria   HPI HPI Comments: Christie [name] is a 28 y.o. female who presents to the Emergency Department complaining of back pain after urinating onset 7 days prior. She also complains of sharp abdominal pain. She states she has associated nausea as well. She states the pain comes and goes. Currently, pain is 7/10. She states she is type 1 diabetic and not able to afford her insulin at this time. She states she hasn't taken her insulin medication for 2 days. She states her medicaid usually pays for it but her PCP is making a change so she is unable to get it. She states she has a history of DKA. She states she takes Novolog and Lantus for her diabetes. She denies fever, vomiting, vaginal discharge, or dysuria.   She states she is unsure if she could be pregnant. She states that she uses the nuva ring but had forget to replace it after having sex last time.  She states her last period was 12/29/13  Past Medical History  Diagnosis Date  . Asthma   . Hx of migraines   . Anemia   . Depression   . Fx ankle     history of left ankle fx at age 89  . Pregnancy induced hypertension   . Diabetes mellitus     type 1 on insulin  . Headache(784.0)   . Urinary tract infection   . Chlamydia   . Abnormal Pap smear     colpo scheduled  . Anxiety    Past Surgical History  Procedure Laterality Date  . Wisdom tooth extraction    . Colposcopy     Family History  Problem Relation Age of Onset  . Asthma Mother   . Diabetes Mother   . Hypertension Mother   . Asthma Father   . Anesthesia problems Neg Hx   . Hypertension Sister   . Diabetes Maternal Grandmother   . Cancer Maternal Grandmother     breast    History  Substance Use Topics  . Smoking status: Never Smoker   . Smokeless tobacco: Never Used  . Alcohol Use: Yes     Comment: social drinker   OB History   Grav Para Term Preterm Abortions TAB SAB Ect Mult Living   3 2 2  0 1 1 0 0 0 2     Review of Systems  Constitutional: Negative for fever.  Respiratory: Negative for cough, chest tightness and shortness of breath.   Cardiovascular: Negative for chest pain.  Gastrointestinal: Positive for nausea and abdominal pain. Negative for vomiting.  Genitourinary: Positive for urgency and flank pain. Negative for dysuria, frequency and decreased urine volume.  Musculoskeletal: Negative for back pain.  Neurological: Negative for headaches.  Psychiatric/Behavioral: Negative for confusion.  All other systems reviewed and are negative.     Allergies  Banana; Citrus; and Adhesive  Home Medications   Prior to Admission medications   Medication Sig Start Date End Date Taking? Authorizing Provider  cetirizine (ZYRTEC) 10 MG tablet Take 10 mg by mouth daily as needed for allergies.    Historical Provider, MD  etonogestrel-ethinyl estradiol (NUVARING) 0.12-0.015 MG/24HR vaginal ring Insert  vaginally and leave in place for 3 consecutive weeks, then remove for 1 week. 10/05/13   Tereso NewcomerUgonna A Anyanwu, MD  fluconazole (DIFLUCAN) 150 MG tablet Take 1 tablet (150 mg total) by mouth once. 12/24/13   Tereso NewcomerUgonna A Anyanwu, MD  fluconazole (DIFLUCAN) 150 MG tablet Take 1 tablet (150 mg total) by mouth once. 01/12/14   Adam PhenixJames G Arnold, MD  insulin aspart (NOVOLOG) 100 UNIT/ML injection Inject 1-15 Units into the skin 3 (three) times daily with meals.  11/06/12   Andrena MewsMichael D Rigby, DO  insulin glargine (LANTUS) 100 UNIT/ML injection Inject 30 Units into the skin daily with breakfast.  11/06/12   Andrena MewsMichael D Rigby, DO   Triage Vitals: BP 133/83  Pulse 92  Temp(Src) 98.3 F (36.8 C) (Oral)  Resp 16  Ht 5\' 9"  (1.753 m)  Wt 192 lb (87.091 kg)  BMI 28.34 kg/m2  SpO2  100%  LMP 12/29/2013  Physical Exam  Nursing note and vitals reviewed. Constitutional: She is oriented to person, place, and time. She appears well-developed and well-nourished. No distress.  HENT:  Head: Normocephalic and atraumatic.  Mouth/Throat: Oropharynx is clear and moist.  Eyes: Pupils are equal, round, and reactive to light.  Neck: Neck supple.  Cardiovascular: Normal rate, regular rhythm and normal heart sounds.   No murmur heard. Pulmonary/Chest: Effort normal and breath sounds normal. No respiratory distress. She has no wheezes.  Abdominal: Soft. Bowel sounds are normal. There is no tenderness. There is no rebound and no guarding.  Musculoskeletal: She exhibits no edema.  Neurological: She is alert and oriented to person, place, and time.  Skin: Skin is warm and dry.  Psychiatric: She has a normal mood and affect.    ED Course  Procedures (including critical care time)  CRITICAL CARE Performed by: Ross MarcusHORTON, COURTNEY, F   Total critical care time: 45 min  Critical care time was exclusive of separately billable procedures and treating other patients.  Critical care was necessary to treat or prevent imminent or life-threatening deterioration.  Critical care was time spent personally by me on the following activities: development of treatment plan with patient and/or surrogate as well as nursing, discussions with consultants, evaluation of patient's response to treatment, examination of patient, obtaining history from patient or surrogate, ordering and performing treatments and interventions, ordering and review of laboratory studies, ordering and review of radiographic studies, pulse oximetry and re-evaluation of patient's condition.  DIAGNOSTIC STUDIES: Oxygen Saturation is 100% on RA, normal by my interpretation.    COORDINATION OF CARE:    Labs Review Labs Reviewed  URINALYSIS, ROUTINE W REFLEX MICROSCOPIC - Abnormal; Notable for the following:    Specific  Gravity, Urine 1.033 (*)    Glucose, UA >1000 (*)    All other components within normal limits  CBC WITH DIFFERENTIAL - Abnormal; Notable for the following:    RBC 5.59 (*)    MCV 68.9 (*)    MCH 22.2 (*)    RDW 17.6 (*)    Neutrophils Relative % 39 (*)    Lymphocytes Relative 49 (*)    Neutro Abs 1.6 (*)    All other components within normal limits  BASIC METABOLIC PANEL - Abnormal; Notable for the following:    Sodium 124 (*)    Chloride 84 (*)    Glucose, Bld 859 (*)    Anion gap 20 (*)    All other components within normal limits  CBG MONITORING, ED - Abnormal; Notable for the following:  Glucose-Capillary >600 (*)    All other components within normal limits  CBG MONITORING, ED - Abnormal; Notable for the following:    Glucose-Capillary >600 (*)    All other components within normal limits  I-STAT VENOUS BLOOD GAS, ED - Abnormal; Notable for the following:    pH, Ven 7.338 (*)    pCO2, Ven 39.8 (*)    Acid-base deficit 4.0 (*)    All other components within normal limits  PREGNANCY, URINE  URINE MICROSCOPIC-ADD ON  HEPATIC FUNCTION PANEL  BLOOD GAS, VENOUS    Imaging Review No results found.   EKG Interpretation None      MDM   Final diagnoses:  Diabetic ketoacidosis without coma associated with diabetes mellitus due to underlying condition    Patient presents with back pain with urination as well as abdominal pain. Also noted to have a glucose of greater than 600 in triage which likely reflects 48 hours without insulin. She is nontoxic on exam. Abdominal exam is benign.  Patient was given fluids and 30 units of Lantus. Labwork notable for pseudohyponatremia likely secondary to hyperglycemia. Glucoses 859. After a liter and a half of fluids, repeat CBG still greater than 600. Patient was placed on glucose stabilizer. Anion gap is 20. While she has no evidence of ketones in her urine, will treat for DKA. This is likely secondary to not having insulin in the last  2 days. Infectious workup and abdominal labs are reassuring.  Will admit patient to the step down unit with Dr. Lovell Sheehan.  I personally performed the services described in this documentation, which was scribed in my presence. The recorded information has been reviewed and is accurate.      Shon Baton, MD 01/19/14 2007

## 2014-01-19 NOTE — ED Notes (Signed)
RN Maralyn SagoSarah informed of Pts CBG reading

## 2014-01-19 NOTE — ED Notes (Signed)
Dr. Wilkie AyeHorton notified of glucose level of 859

## 2014-01-19 NOTE — H&P (Signed)
[facility] Admission History and Physical       Christie [name] ZOX:[id number] DOB: 06-20-1986 DOA: 01/19/2014  Referring physician:  EDP PCP:  Dr. Providence Lanius of [facility]:   Chief Complaint:   Increased Blood Sugar and ABD Pain and Nausea  HPI: Christie [name] is a 28 y.o. female with a history of Type 1 DM who presented to the [facility] ED with complaints of critically high Blood sugars that were unmeasurable on her glucometer along with diffuse ABD pain, Nausea and Malaise.  She reports that she hs had multiple changes in her lnsulin rx, and ran out of her monthly supply of insulin 2 days ago, and her Insurance will not cover a refill since it is too early.     She reports that she take her medications as prescribed and is compliant with her therapy.     Review of Systems:  Constitutional: No Weight Loss, No Weight Gain, Night Sweats, Fevers, Chills, Dizziness, Fatigue, +Malaise, or Generalized Weakness HEENT: No Headaches, Difficulty Swallowing,Tooth/Dental Problems,Sore Throat,  No Sneezing, Rhinitis, Ear Ache, Nasal Congestion, or Post Nasal Drip,  Cardio-vascular:  No Chest pain, Orthopnea, PND, Edema in Lower Extremities, Anasarca, Dizziness, Palpitations  Resp: No Dyspnea, No DOE, No Cough, No Hemoptysis, No Wheezing.    GI: No Heartburn, Indigestion, +Abdominal Pain, +Nausea, Vomiting, Diarrhea, Hematemesis, Hematochezia, Melena, Change in Bowel Habits,  Loss of Appetite  GU: No Dysuria, Change in Color of Urine, No Urgency or Frequency, No Flank pain.  Musculoskeletal: No Joint Pain or Swelling, No Decreased Range of Motion, No Back Pain.  Neurologic: No Syncope, No Seizures, Muscle Weakness, Paresthesia, Vision Disturbance or Loss, No Diplopia, No Vertigo, No Difficulty Walking,  Skin: No Rash or Lesions. Psych: No Change in Mood or Affect, No Depression or Anxiety, No Memory loss, No Confusion, or Hallucinations   Past Medical History  Diagnosis Date  .  Asthma   . Hx of migraines   . Anemia   . Depression   . Fx ankle     history of left ankle fx at age 69  . Pregnancy induced hypertension   . Diabetes mellitus     type 1 on insulin  . Headache(784.0)   . Urinary tract infection   . Chlamydia   . Abnormal Pap smear     colpo scheduled  . Anxiety     Past Surgical History  Procedure Laterality Date  . Wisdom tooth extraction    . Colposcopy       Prior to Admission medications   Medication Sig Start Date End Date Taking? Authorizing Provider  cetirizine (ZYRTEC) 10 MG tablet Take 10 mg by mouth daily as needed for allergies.    Historical Provider, MD  etonogestrel-ethinyl estradiol (NUVARING) 0.12-0.015 MG/24HR vaginal ring Insert vaginally and leave in place for 3 consecutive weeks, then remove for 1 week. 10/05/13   Tereso Newcomer, MD  fluconazole (DIFLUCAN) 150 MG tablet Take 1 tablet (150 mg total) by mouth once. 12/24/13   Tereso Newcomer, MD  fluconazole (DIFLUCAN) 150 MG tablet Take 1 tablet (150 mg total) by mouth once. 01/12/14   Adam Phenix, MD  insulin aspart (NOVOLOG) 100 UNIT/ML injection Inject 1-15 Units into the skin 3 (three) times daily with meals.  11/06/12   Andrena Mews, DO  insulin glargine (LANTUS) 100 UNIT/ML injection Inject 30 Units into the skin daily with breakfast.  11/06/12   Andrena Mews, DO    Allergies  Allergen  Reactions  . Banana Anaphylaxis  . Citrus Other (See Comments)    Gums bleed  . Adhesive [Tape] Rash    Social History:  reports that she has never smoked. She has never used smokeless tobacco. She reports that she drinks alcohol. She reports that she does not use illicit drugs.     Family History  Problem Relation Age of Onset  . Asthma Mother   . Diabetes Mother   . Hypertension Mother   . Asthma Father   . Anesthesia problems Neg Hx   . Hypertension Sister   . Diabetes Maternal Grandmother   . Cancer Maternal Grandmother     breast      Physical Exam:  GEN:  Pleasant Well Nourished and Well Developed  27 y.o. African American female examined and in no acute distress; cooperative with exam Filed Vitals:   01/19/14 1801 01/19/14 2200  BP: 133/83 121/70  Pulse: 92   Temp: 98.3 F (36.8 C)   TempSrc: Oral   Resp: 16 16  Height: 5\' 9"  (1.753 m)   Weight: 87.091 kg (192 lb)   SpO2: 100%    Blood pressure 121/70, pulse 92, temperature 98.3 F (36.8 C), temperature source Oral, resp. rate 16, height 5\' 9"  (1.753 m), weight 87.091 kg (192 lb), last menstrual period 12/29/2013, SpO2 100.00%. PSYCH: She is alert and oriented x4; does not appear anxious does not appear depressed; affect is normal HEENT: Normocephalic and Atraumatic, Mucous membranes pink; PERRLA; EOM intact; Fundi:  Benign;  No scleral icterus, Nares: Patent, Oropharynx: Clear, Fair Dentition, Neck:  FROM, No Cervical Lymphadenopathy nor Thyromegaly or Carotid Bruit; No JVD; Breasts:: Not examined CHEST WALL: No tenderness CHEST: Normal respiration, clear to auscultation bilaterally HEART: Regular rate and rhythm; no murmurs rubs or gallops BACK: No kyphosis or scoliosis; No CVA tenderness ABDOMEN: Positive Bowel Sounds, Soft Non-Tender; No Masses, No Organomegaly. Rectal Exam: Not done EXTREMITIES: No Cyanosis, Clubbing, or Edema; No Ulcerations. Genitalia: not examined PULSES: 2+ and symmetric SKIN: Normal hydration no rash or ulceration CNS:  Alert and Oriented x 4, No Focal Deficits.     Vascular: pulses palpable throughout    Labs on Admission:  Basic Metabolic Panel:  Recent Labs Lab 01/19/14 1828  NA 124*  K 4.6  CL 84*  CO2 20  GLUCOSE 859*  BUN 13  CREATININE 0.80  CALCIUM 10.2   Liver Function Tests:  Recent Labs Lab 01/19/14 1835  AST 16  ALT 10  ALKPHOS 71  BILITOT 0.3  PROT 8.2  ALBUMIN 3.8   No results found for this basename: LIPASE, AMYLASE,  in the last 168 hours No results found for this basename: AMMONIA,  in the last 168  hours CBC:  Recent Labs Lab 01/19/14 1828  WBC 4.2  NEUTROABS 1.6*  HGB 12.4  HCT 38.5  MCV 68.9*  PLT 204   Cardiac Enzymes: No results found for this basename: CKTOTAL, CKMB, CKMBINDEX, TROPONINI,  in the last 168 hours  BNP (last 3 results) No results found for this basename: PROBNP,  in the last 8760 hours CBG:  Recent Labs Lab 01/19/14 1804 01/19/14 1919 01/19/14 2055 01/19/14 2205  GLUCAP >600* >600* 574* 379*    Radiological Exams on Admission: No results found.     Assessment/Plan:   28 y.o. female with  Principal Problem: 1.   Diabetic Ketoacidosis without coma associated with diabetes mellitus due to underlying condition-  Placed on the DKA protocol with IV Insulin,  in addition also given Lantus 30 Units by EDP  Prior to leaving [facility],  Monitor for hypoglycemia.  Monitor Electrolytes.     Active Problems: 2.   Asthma in adult-   Stable.    3.   Abdominal pain, unspecified site-  Due to #1.   Pain control PRN.    4.   Nausea alone-    Due to #1,  Anti-Emetic PRN,   5.   DVT Prophylaxis with Lovenox.       Code Status:    FULL CODE   Family Communication:    No Family Present Disposition Plan:     Inpatient Stepdown  Time spent:  84 Minutes  Ron Parker [facility] Pager [phone]   If 7AM -7PM Please Contact the Day Rounding Team MD for [facility]  If 7PM-7AM, Please Contact night-coverage  [email or web address] Password [facility] 01/19/2014, 10:34 PM

## 2014-01-19 NOTE — ED Notes (Signed)
C/o pain to back after urinating x 1 week-also states she is Type I DM and out of insulin x 48 hrs

## 2014-01-20 DIAGNOSIS — E1010 Type 1 diabetes mellitus with ketoacidosis without coma: Principal | ICD-10-CM

## 2014-01-20 LAB — GLUCOSE, CAPILLARY
GLUCOSE-CAPILLARY: 139 mg/dL — AB (ref 70–99)
GLUCOSE-CAPILLARY: 160 mg/dL — AB (ref 70–99)
GLUCOSE-CAPILLARY: 181 mg/dL — AB (ref 70–99)
GLUCOSE-CAPILLARY: 187 mg/dL — AB (ref 70–99)
GLUCOSE-CAPILLARY: 188 mg/dL — AB (ref 70–99)
GLUCOSE-CAPILLARY: 198 mg/dL — AB (ref 70–99)
GLUCOSE-CAPILLARY: 235 mg/dL — AB (ref 70–99)
GLUCOSE-CAPILLARY: 260 mg/dL — AB (ref 70–99)
GLUCOSE-CAPILLARY: 308 mg/dL — AB (ref 70–99)
Glucose-Capillary: 150 mg/dL — ABNORMAL HIGH (ref 70–99)
Glucose-Capillary: 110 mg/dL — ABNORMAL HIGH (ref 70–99)
Glucose-Capillary: 97 mg/dL (ref 70–99)
Glucose-Capillary: 119 mg/dL — ABNORMAL HIGH (ref 70–99)
Glucose-Capillary: 141 mg/dL — ABNORMAL HIGH (ref 70–99)
Glucose-Capillary: 172 mg/dL — ABNORMAL HIGH (ref 70–99)
Glucose-Capillary: 178 mg/dL — ABNORMAL HIGH (ref 70–99)
Glucose-Capillary: 194 mg/dL — ABNORMAL HIGH (ref 70–99)
Glucose-Capillary: 245 mg/dL — ABNORMAL HIGH (ref 70–99)
Glucose-Capillary: 123 mg/dL — ABNORMAL HIGH (ref 70–99)

## 2014-01-20 LAB — BASIC METABOLIC PANEL
ANION GAP: 14 (ref 5–15)
ANION GAP: 18 — AB (ref 5–15)
Anion gap: 17 — ABNORMAL HIGH (ref 5–15)
Anion gap: 16 — ABNORMAL HIGH (ref 5–15)
Anion gap: 14 (ref 5–15)
BUN: 10 mg/dL (ref 6–23)
BUN: 8 mg/dL (ref 6–23)
BUN: 9 mg/dL (ref 6–23)
BUN: 11 mg/dL (ref 6–23)
BUN: 10 mg/dL (ref 6–23)
CALCIUM: 9.6 mg/dL (ref 8.4–10.5)
CHLORIDE: 98 meq/L (ref 96–112)
CHLORIDE: 99 meq/L (ref 96–112)
CO2: 20 mEq/L (ref 19–32)
CO2: 20 mEq/L (ref 19–32)
CO2: 19 meq/L (ref 19–32)
CO2: 21 mEq/L (ref 19–32)
CO2: 20 mEq/L (ref 19–32)
Calcium: 8.9 mg/dL (ref 8.4–10.5)
Calcium: 8.7 mg/dL (ref 8.4–10.5)
Calcium: 8.5 mg/dL (ref 8.4–10.5)
Calcium: 8.4 mg/dL (ref 8.4–10.5)
Chloride: 99 mEq/L (ref 96–112)
Chloride: 102 mEq/L (ref 96–112)
Chloride: 101 mEq/L (ref 96–112)
Creatinine, Ser: 0.64 mg/dL (ref 0.50–1.10)
Creatinine, Ser: 0.63 mg/dL (ref 0.50–1.10)
Creatinine, Ser: 0.62 mg/dL (ref 0.50–1.10)
Creatinine, Ser: 0.75 mg/dL (ref 0.50–1.10)
Creatinine, Ser: 0.87 mg/dL (ref 0.50–1.10)
GFR calc Af Amer: >90 mL/min (ref >90)
GFR calc Af Amer: >90 mL/min (ref >90)
GFR calc Af Amer: >90 mL/min (ref >90)
GFR calc non Af Amer: >90 mL/min (ref >90)
GLUCOSE: 185 mg/dL — AB (ref 70–99)
GLUCOSE: 293 mg/dL — AB (ref 70–99)
Glucose, Bld: 171 mg/dL — ABNORMAL HIGH (ref 70–99)
Glucose, Bld: 181 mg/dL — ABNORMAL HIGH (ref 70–99)
Glucose, Bld: 334 mg/dL — ABNORMAL HIGH (ref 70–99)
POTASSIUM: 3.9 meq/L (ref 3.7–5.3)
POTASSIUM: 4.3 meq/L (ref 3.7–5.3)
Potassium: 3.9 mEq/L (ref 3.7–5.3)
Potassium: 3.6 mEq/L — ABNORMAL LOW (ref 3.7–5.3)
Potassium: 3.9 mEq/L (ref 3.7–5.3)
SODIUM: 133 meq/L — AB (ref 137–147)
Sodium: 137 mEq/L (ref 137–147)
Sodium: 135 mEq/L — ABNORMAL LOW (ref 137–147)
Sodium: 137 mEq/L (ref 137–147)
Sodium: 136 mEq/L — ABNORMAL LOW (ref 137–147)

## 2014-01-20 LAB — CBC
HEMATOCRIT: 37 % (ref 36.0–46.0)
Hemoglobin: 11.9 g/dL — ABNORMAL LOW (ref 12.0–15.0)
MCH: 21.9 pg — AB (ref 26.0–34.0)
MCHC: 32.2 g/dL (ref 30.0–36.0)
MCV: 68.1 fL — AB (ref 78.0–100.0)
PLATELETS: 226 10*3/uL (ref 150–400)
RBC: 5.43 MIL/uL — ABNORMAL HIGH (ref 3.87–5.11)
RDW: 16.2 % — ABNORMAL HIGH (ref 11.5–15.5)
WBC: 6.2 10*3/uL (ref 4.0–10.5)

## 2014-01-20 LAB — MRSA PCR SCREENING: MRSA by PCR: NEGATIVE

## 2014-01-20 MED ORDER — ACETAMINOPHEN 325 MG PO TABS: 650.0000 mg | ORAL_TABLET | Freq: Four times a day (QID) | ORAL | Status: DC | PRN

## 2014-01-20 MED ORDER — INSULIN ASPART 100 UNIT/ML ~~LOC~~ SOLN
4.0000 [IU] | Freq: Once | SUBCUTANEOUS | Status: AC
  Administered 2014-01-20: 4 [IU] via SUBCUTANEOUS

## 2014-01-20 MED ORDER — INSULIN ASPART 100 UNIT/ML ~~LOC~~ SOLN
0.0000 [IU] | Freq: Three times a day (TID) | SUBCUTANEOUS | Status: DC
  Administered 2014-01-20: 1 [IU] via SUBCUTANEOUS
  Administered 2014-01-21: 2 [IU] via SUBCUTANEOUS

## 2014-01-20 MED ORDER — INSULIN GLARGINE 100 UNIT/ML ~~LOC~~ SOLN
30.0000 [IU] | Freq: Once | SUBCUTANEOUS | Status: AC
  Administered 2014-01-20: 30 [IU] via SUBCUTANEOUS
  Filled 2014-01-20 (×2): qty 0.3

## 2014-01-20 NOTE — Progress Notes (Signed)
Pt 2200 CBG 308. Pt only has insulin meal coverage. MD notified and order for 4 units of insulin given. Will continue to monitor pt.

## 2014-01-20 NOTE — Progress Notes (Signed)
Gap anion-16. MD aware gave to repeat BMP at 2:40pm today

## 2014-01-20 NOTE — Progress Notes (Signed)
Utilization Review Completed.Christie [name] T7/15/2015  

## 2014-01-20 NOTE — Progress Notes (Signed)
Gap anion- 14 , co2 21- md aware with order.

## 2014-01-20 NOTE — Progress Notes (Signed)
[facility] PROGRESS NOTE  Christie [name] ZOX:[id number] DOB: [birth year] DOA: 01/19/2014 PCP: No primary provider on file.  Assessment/Plan: 1. Diabetic ketoacidosis- AG is slowly improving, continue with IV insulin as per DKA protocol. Will follow next BMP, and possibly switch to Lantus and sliding scale insulin with NovoLog. 2. Diabetes mellitus- patient takes Lantus 30 units daily along with sliding scale insulin with NovoLog. Will check hemoglobin A1c. 3. Asthma- stable. 4. DVT prophylaxis-Lovenox  Code Status: Full code Family Communication: *No family at bedside Disposition Plan: Home when stable    Consultants:  None  Procedures:  None  Antibiotics:  *None  HPI/Subjective: 28 y.o. female with a history of Type 1 DM who [facility] with complaints of critically high Blood sugars that were unmeasurable on her glucometer along with diffuse ABD pain, Nausea and Malaise. She reports that she hs had multiple changes in her lnsulin rx, and ran out of her monthly supply of insulin 2 days ago, and her Insurance will not cover a refill since it is too early. She reports that she take her medications as prescribed and is compliant with her therapy.  Patient denies any complaints this morning, hungry and wants to eat. Blood glucose have been consistently less than 200 x 3 times. Anion gap is still 16     Objective: Filed Vitals:   01/20/14 1200  BP: 110/75  Pulse: 74  Temp:   Resp: 18    Intake/Output Summary (Last 24 hours) at 01/20/14 1244 Last data filed at 01/20/14 1200  Gross per 24 hour  Intake   1781 ml  Output      0 ml  Net   1781 ml   Filed Weights   01/19/14 1801  Weight: 87.091 kg (192 lb)    Exam:  Physical Exam: Head: Normocephalic, atraumatic.  Eyes: No signs of jaundice, EOMI Lungs: Normal respiratory effort. B/L Clear to auscultation, no crackles or wheezes.  Heart: Regular RR. S1 and S2 normal  Abdomen: BS normoactive.  Soft, Nondistended, non-tender.  Extremities: No pretibial edema, no erythema   Data Reviewed: Basic Metabolic Panel:  Recent Labs Lab 01/19/14 1828 01/20/14 0040 01/20/14 0220 01/20/14 1040  NA 124* 137 135* 137  K 4.6 3.9 3.6* 3.9  CL 84* 99 98 102  CO2 20 20 20 19   GLUCOSE 859* 171* 181* 185*  BUN 13 10 10 8   CREATININE 0.80 0.64 0.63 0.62  CALCIUM 10.2 9.6 8.9 8.7   Liver Function Tests:  Recent Labs Lab 01/19/14 1835  AST 16  ALT 10  ALKPHOS 71  BILITOT 0.3  PROT 8.2  ALBUMIN 3.8   No results found for this basename: LIPASE, AMYLASE,  in the last 168 hours No results found for this basename: AMMONIA,  in the last 168 hours CBC:  Recent Labs Lab 01/19/14 1828 01/20/14 0040  WBC 4.2 6.2  NEUTROABS 1.6*  --   HGB 12.4 11.9*  HCT 38.5 37.0  MCV 68.9* 68.1*  PLT 204 226   Cardiac Enzymes: No results found for this basename: CKTOTAL, CKMB, CKMBINDEX, TROPONINI,  in the last 168 hours BNP (last 3 results) No results found for this basename: PROBNP,  in the last 8760 hours CBG:  Recent Labs Lab 01/20/14 0201 01/20/14 0300 01/20/14 0359 01/20/14 0517 01/20/14 0603  GLUCAP 198* 160* 110* 97 119*    Recent Results (from the past 240 hour(s))  MRSA PCR SCREENING     Status: None   Collection  Time    01/19/14  9:59 PM      Result Value Ref Range Status   MRSA by PCR NEGATIVE  NEGATIVE Final   Comment:            The GeneXpert MRSA Assay (FDA     approved for NASAL specimens     only), is one component of a     comprehensive MRSA colonization     surveillance program. It is not     intended to diagnose MRSA     infection nor to guide or     monitor treatment for     MRSA infections.     Studies: No results found.  Scheduled Meds: . enoxaparin (LOVENOX) injection  40 mg Subcutaneous Q24H   Continuous Infusions: . sodium chloride 150 mL/hr (01/19/14 2347)  . dextrose 5 % and 0.45% NaCl 125 mL/hr at 01/19/14 1954  . dextrose 5 % and  0.45% NaCl 100 mL/hr (01/19/14 2347)  . insulin (NOVOLIN-R) infusion 10.3 Units/hr (01/19/14 2100)  . insulin (NOVOLIN-R) infusion      Principal Problem:   Diabetic ketoacidosis without coma associated with diabetes mellitus due to underlying condition Active Problems:   Asthma in adult   Abdominal pain, unspecified site   Nausea alone   DKA, type 1    Time spent: 25 minutes    [facility]  [facility] Pager [phone]*. If 7PM-7AM, please contact night-coverage at [email or web address], password [facility] 01/20/2014, 12:44 PM  LOS: 1 day

## 2014-01-20 NOTE — Progress Notes (Signed)
Lab called that bmp drawn was hemolyzed and it needs to be redrawn. Explained to the pt.

## 2014-01-21 DIAGNOSIS — E1310 Other specified diabetes mellitus with ketoacidosis without coma: Secondary | ICD-10-CM

## 2014-01-21 LAB — BASIC METABOLIC PANEL
ANION GAP: 14 (ref 5–15)
BUN: 9 mg/dL (ref 6–23)
CHLORIDE: 104 meq/L (ref 96–112)
CO2: 23 mEq/L (ref 19–32)
Calcium: 8.6 mg/dL (ref 8.4–10.5)
Creatinine, Ser: 0.72 mg/dL (ref 0.50–1.10)
GFR calc Af Amer: >90 mL/min (ref >90)
GFR calc non Af Amer: >90 mL/min (ref >90)
Glucose, Bld: 164 mg/dL — ABNORMAL HIGH (ref 70–99)
Potassium: 3.6 mEq/L — ABNORMAL LOW (ref 3.7–5.3)
SODIUM: 141 meq/L (ref 137–147)

## 2014-01-21 LAB — GLUCOSE, CAPILLARY: GLUCOSE-CAPILLARY: 177 mg/dL — AB (ref 70–99)

## 2014-01-21 MED ORDER — INSULIN ASPART 100 UNIT/ML ~~LOC~~ SOLN: 1.0000 [IU] | Freq: Three times a day (TID) | SUBCUTANEOUS | Status: DC

## 2014-01-21 NOTE — Care Management Note (Signed)
    Page 1 of 1   01/21/2014     10:58:11 AM CARE MANAGEMENT NOTE 01/21/2014  Patient:  Christie [name],Christie [name]   Account Number:  [id number]  Date Initiated:  01/21/2014  Documentation initiated by:  Irma Roulhac  Subjective/Objective Assessment:   dx DKA    PCP  Dr Devra Doppamieka Howell with [facility], phone [phone]     DC Planning Services  CM consult      Status of service:  Completed, signed off  Discharge Disposition:  HOME/SELF CARE  Per UR Regulation:  Reviewed for med. necessity/level of care/duration of stay  Comments:  01/21/14 1050 Rea Kalama RN MSN BSN CCM Per pt, she was unable to get insulin refill as she used more than was prescribed and had not gone 30 days since last fill.  Novolog and Lantus are both on [facility] preferred list and her copay is $3.00 for each - she can afford copay.  She is interested in insulin pump and will discuss referral to endocrinologist with PCP.  Per pt's request, history and physical and discharge summary [facility] @ [phone].

## 2014-01-21 NOTE — Discharge Summary (Signed)
Physician Discharge Summary  Christie [name] ZOX:[id number] DOB: Oct 22, 1985 DOA: 01/19/2014  PCP: No primary provider on file.  Admit date: 01/19/2014 Discharge date: 01/21/2014  Time spent: *50 minutes  Recommendations for Outpatient Follow-up:  1. Follow up PCP in 2 weeks  Discharge Diagnoses:  Principal Problem:   Diabetic ketoacidosis without coma associated with diabetes mellitus due to underlying condition Active Problems:   Asthma in adult   Abdominal pain, unspecified site   Nausea alone   DKA, type 1   Discharge Condition: Stable  Diet recommendation: Carb modified diet  Filed Weights   01/19/14 1801  Weight: 87.091 kg (192 lb)    History of present illness:  28 y.o. female with a history of Type 1 DM who [facility] with complaints of critically high Blood sugars that were unmeasurable on her glucometer along with diffuse ABD pain, Nausea and Malaise. She reports that she hs had multiple changes in her lnsulin rx, and ran out of her monthly supply of insulin 2 days ago, and her Insurance will not cover a refill since it is too early. She reports that she take her medications as prescribed and is compliant with her therapy.   Hospital Course:  Diabetic ketoacidosis- Patient was admitted with DKA, was started on IV insulin per DKA protocol, now resolved. Blood glucose is well controlled.   Diabetes mellitus- patient takes Lantus 30 units daily along with sliding scale insulin with NovoLog. Will give the prescription for Novolog.  Asthma- stable.   Procedures:  None)  Consultations:  None  Discharge Exam: Filed Vitals:   01/21/14 0336  BP: 104/59  Pulse: 84  Temp: 98.4 F (36.9 C)  Resp:     General: Appear in no acute distress Cardiovascular: S1S2 RRR Respiratory: Clear bilaterally  Discharge Instructions You were cared for by a hospitalist during your hospital stay. If you have any questions about your discharge medications or the  care you received while you were in the hospital after you are discharged, you can call the unit and asked to speak with the hospitalist on call if the hospitalist that took care of you is not available. Once you are discharged, your primary care physician will handle any further medical issues. Please note that NO REFILLS for any discharge medications will be authorized once you are discharged, as it is imperative that you return to your primary care physician (or establish a relationship with a primary care physician if you do not have one) for your aftercare needs so that they can reassess your need for medications and monitor your lab values.  Discharge Instructions   Diet - low sodium heart healthy    Complete by:  As directed      Increase activity slowly    Complete by:  As directed             Medication List    STOP taking these medications       fluconazole 150 MG tablet  Commonly known as:  DIFLUCAN      TAKE these medications       cetirizine 10 MG tablet  Commonly known as:  ZYRTEC  Take 10 mg by mouth daily as needed for allergies.     etonogestrel-ethinyl estradiol 0.12-0.015 MG/24HR vaginal ring  Commonly known as:  NUVARING  Insert vaginally and leave in place for 3 consecutive weeks, then remove for 1 week.     insulin aspart 100 UNIT/ML injection  Commonly known as:  novoLOG  Inject 1-15 Units into the skin 3 (three) times daily with meals.     insulin glargine 100 UNIT/ML injection  Commonly known as:  LANTUS  Inject 30 Units into the skin daily with breakfast.       Allergies  Allergen Reactions  . Banana Anaphylaxis  . Citrus Other (See Comments)    Gums bleed  . Adhesive [Tape] Rash       Follow-up Information   Follow up with [facility], [place] In 2 weeks.   Specialty:  Family Medicine   Contact information:   [address] [place] [address] [phone]        The results of significant diagnostics from this  hospitalization (including imaging, microbiology, ancillary and laboratory) are listed below for reference.    Significant Diagnostic Studies: No results found.  Microbiology: Recent Results (from the past 240 hour(s))  MRSA PCR SCREENING     Status: None   Collection Time    01/19/14  9:59 PM      Result Value Ref Range Status   MRSA by PCR NEGATIVE  NEGATIVE Final   Comment:            The GeneXpert MRSA Assay (FDA     approved for NASAL specimens     only), is one component of a     comprehensive MRSA colonization     surveillance program. It is not     intended to diagnose MRSA     infection nor to guide or     monitor treatment for     MRSA infections.     Labs: Basic Metabolic Panel:  Recent Labs Lab 01/20/14 0220 01/20/14 1040 01/20/14 1415 01/20/14 2109 01/21/14 0335  NA 135* 137 136* 133* 141  K 3.6* 3.9 3.9 4.3 3.6*  CL 98 102 101 99 104  CO2 20 19 21 20 23   GLUCOSE 181* 185* 293* 334* 164*  BUN 10 8 9 11 9   CREATININE 0.63 0.62 0.75 0.87 0.72  CALCIUM 8.9 8.7 8.5 8.4 8.6   Liver Function Tests:  Recent Labs Lab 01/19/14 1835  AST 16  ALT 10  ALKPHOS 71  BILITOT 0.3  PROT 8.2  ALBUMIN 3.8   No results found for this basename: LIPASE, AMYLASE,  in the last 168 hours No results found for this basename: AMMONIA,  in the last 168 hours CBC:  Recent Labs Lab 01/19/14 1828 01/20/14 0040  WBC 4.2 6.2  NEUTROABS 1.6*  --   HGB 12.4 11.9*  HCT 38.5 37.0  MCV 68.9* 68.1*  PLT 204 226   Cardiac Enzymes: No results found for this basename: CKTOTAL, CKMB, CKMBINDEX, TROPONINI,  in the last 168 hours BNP: BNP (last 3 results) No results found for this basename: PROBNP,  in the last 8760 hours CBG:  Recent Labs Lab 01/20/14 1409 01/20/14 1514 01/20/14 1628 01/20/14 1709 01/20/14 2125  GLUCAP 260* 245* 187* 123* 308*       Signed:  Meshawn Oconnor S  [facility] 01/21/2014, 7:48 AM

## 2014-01-21 NOTE — Progress Notes (Signed)
Inpatient Diabetes Program Recommendations  AACE/ADA: New Consensus Statement on Inpatient Glycemic Control (2013)  Target Ranges:  Prepandial:   less than 140 mg/dL      Peak postprandial:   less than 180 mg/dL (1-2 hours)      Critically ill patients:  140 - 180 mg/dL   This coordinator met with patient to discuss DKA admission.  Pt reports she sometimes misses doses of insulin and that she has never had any formal diabetes education.   Pt uses the Lantus SoloStar pen and prefers that her Novolog also be a pen.  RX for Novolog has been changed to Flexpen at discharge.  Pt has never been seen by an endocrinologist and would like to establish care with one so that she can possibly pursue an insulin pump in the future.  Encouraged pt to keep meticulous records of all CBGs, insulin doses and meals.  An appointment has been made for her with Dr. Philemon Kingdom February 04, 2014 at 2:00.  An outpatient referral has been made for patient to attend the [facility]) for formal education.  No questions/concerns at the end of our visit. Thank you  Raoul Pitch BSN, RN,CDE Inpatient Diabetes Coordinator [phone] (team pager)

## 2014-01-25 ENCOUNTER — Other Ambulatory Visit (HOSPITAL_COMMUNITY)
Admission: RE | Admit: 2014-01-25 | Discharge: 2014-01-25 | Disposition: A | Discharge status: Home / Self Care | Payer: Medicaid Other | Source: Ambulatory Visit | Attending: Obstetrics & Gynecology | Admitting: Obstetrics & Gynecology

## 2014-01-25 ENCOUNTER — Ambulatory Visit (INDEPENDENT_AMBULATORY_CARE_PROVIDER_SITE_OTHER): Payer: Medicaid Other | Admitting: Obstetrics & Gynecology

## 2014-01-25 ENCOUNTER — Encounter: Payer: Self-pay | Admitting: Obstetrics & Gynecology

## 2014-01-25 VITALS — BP 120/73 | HR 81 | Temp 98.6°F | Ht 69.0 in | Wt 194.4 lb

## 2014-01-25 DIAGNOSIS — Z113 Encounter for screening for infections with a predominantly sexual mode of transmission: Secondary | ICD-10-CM | POA: Insufficient documentation

## 2014-01-25 DIAGNOSIS — Z1151 Encounter for screening for human papillomavirus (HPV): Secondary | ICD-10-CM | POA: Diagnosis present

## 2014-01-25 DIAGNOSIS — R87810 Cervical high risk human papillomavirus (HPV) DNA test positive: Secondary | ICD-10-CM | POA: Insufficient documentation

## 2014-01-25 DIAGNOSIS — IMO0002 Reserved for concepts with insufficient information to code with codable children: Secondary | ICD-10-CM

## 2014-01-25 DIAGNOSIS — Z124 Encounter for screening for malignant neoplasm of cervix: Secondary | ICD-10-CM | POA: Insufficient documentation

## 2014-01-25 DIAGNOSIS — R87612 Low grade squamous intraepithelial lesion on cytologic smear of cervix (LGSIL): Secondary | ICD-10-CM

## 2014-01-25 DIAGNOSIS — R6889 Other general symptoms and signs: Secondary | ICD-10-CM

## 2014-01-25 NOTE — Progress Notes (Signed)
   Subjective:    Patient ID: Christie [name], female    DOB: 07-Jan-1986, 28 y.o.   MRN: [id number]  HPI 28 yo P2 here today for a repeat pap. She had a LGSIL pap 8/14 and needs a follow up pap. She is starting a new job with [employer] soon and will not be able to miss work in August. She has had some yeast infections so we have discussed that she should not use Dial soap on her vulva. I have recommended yogourt daily.  She uses Nuvaring for contraception and is happy with that method.   Review of Systems     Objective:   Physical Exam Normal appearing vulva and vagina and cervix       Assessment & Plan:  LGISL- repeat pap today

## 2014-01-26 LAB — CYTOLOGY - PAP

## 2014-01-28 ENCOUNTER — Encounter: Payer: Medicaid Other | Attending: Family Medicine

## 2014-01-28 VITALS — Ht 69.0 in | Wt 198.7 lb

## 2014-01-28 DIAGNOSIS — IMO0002 Reserved for concepts with insufficient information to code with codable children: Secondary | ICD-10-CM | POA: Diagnosis not present

## 2014-01-28 DIAGNOSIS — E108 Type 1 diabetes mellitus with unspecified complications: Secondary | ICD-10-CM

## 2014-01-28 DIAGNOSIS — Z713 Dietary counseling and surveillance: Secondary | ICD-10-CM | POA: Diagnosis present

## 2014-01-28 DIAGNOSIS — E1065 Type 1 diabetes mellitus with hyperglycemia: Secondary | ICD-10-CM

## 2014-01-28 DIAGNOSIS — Z794 Long term (current) use of insulin: Secondary | ICD-10-CM | POA: Diagnosis not present

## 2014-01-29 LAB — HM DIABETES EYE EXAM

## 2014-01-30 NOTE — Progress Notes (Signed)
Patient was seen on 01/28/14 for the first of a series of three diabetes self-management courses at the [facility].  Patient Education Plan per assessed needs and concerns is to attend four course education program for Diabetes Self Management Education.Patient noted that she would soon be seeing an endocrinologist. Lavena Stanford and I discussed insulin pump therapy.  Current HbA1c: Not available  The following learning objectives were met by the patient during this class:  Describe diabetes  State some common risk factors for diabetes  Defines the role of glucose and insulin  Identifies type of diabetes and pathophysiology  Describe the relationship between diabetes and cardiovascular risk  State the members of the Healthcare Team  States the rationale for glucose monitoring  State when to test glucose  State their individual Target Range  State the importance of logging glucose readings  Describe how to interpret glucose readings  Identifies A1C target  Explain the correlation between A1c and eAG values  State symptoms and treatment of high blood glucose  State symptoms and treatment of low blood glucose  Explain proper technique for glucose testing  Identifies proper sharps disposal  Handouts given during class include:  Living Well with Diabetes book  Carb Counting and Meal Planning book  Meal Plan Card  Carbohydrate guide  Meal planning worksheet  Low Sodium Flavoring Tips  The diabetes portion plate  G2X to eAG Conversion Chart  Diabetes Medications  Diabetes Recommended Care Schedule  Support Group  Diabetes Success Plan  Core Class Satisfaction Survey  Follow-Up Plan:  Attend core 2

## 2014-02-02 ENCOUNTER — Telehealth: Payer: Self-pay

## 2014-02-02 NOTE — Telephone Encounter (Signed)
Message copied by Louanna RawAMPBELL, Miosha Behe M on Tue Feb 02, 2014  8:34 AM ------      Message from: Allie BossierVE, MYRA C      Created: Mon Feb 01, 2014  2:05 PM       Pap in 1 year ------

## 2014-02-02 NOTE — Telephone Encounter (Signed)
Called patient and informed her of pap results and need to repeat pap in 1 year. Advised patient to call 2 months prior to being due to schedule pap. Patient verbalized understanding and gratitude. No further questions or concerns.

## 2014-02-04 ENCOUNTER — Encounter: Payer: Self-pay | Admitting: Internal Medicine

## 2014-02-04 ENCOUNTER — Ambulatory Visit (INDEPENDENT_AMBULATORY_CARE_PROVIDER_SITE_OTHER): Payer: Medicaid Other | Admitting: Internal Medicine

## 2014-02-04 VITALS — BP 106/78 | HR 108 | Temp 99.0°F | Resp 12 | Ht 69.0 in | Wt 195.8 lb

## 2014-02-04 DIAGNOSIS — E1065 Type 1 diabetes mellitus with hyperglycemia: Secondary | ICD-10-CM

## 2014-02-04 DIAGNOSIS — IMO0002 Reserved for concepts with insufficient information to code with codable children: Secondary | ICD-10-CM | POA: Diagnosis not present

## 2014-02-04 DIAGNOSIS — E119 Type 2 diabetes mellitus without complications: Secondary | ICD-10-CM

## 2014-02-04 DIAGNOSIS — E108 Type 1 diabetes mellitus with unspecified complications: Principal | ICD-10-CM

## 2014-02-04 LAB — MICROALBUMIN / CREATININE URINE RATIO
Creatinine,U: 106.1 mg/dL
MICROALB UR: 0.2 mg/dL (ref 0.0–1.9)
Microalb Creat Ratio: 0.2 mg/g (ref 0.0–30.0)

## 2014-02-04 LAB — HEMOGLOBIN A1C: HEMOGLOBIN A1C: 13.3 % — AB (ref 4.6–6.5)

## 2014-02-04 MED ORDER — INSULIN ASPART 100 UNIT/ML ~~LOC~~ SOLN: 15.0000 [IU] | Freq: Three times a day (TID) | SUBCUTANEOUS | Status: DC

## 2014-02-04 MED ORDER — GLUCAGON (RDNA) 1 MG IJ KIT: 1.0000 mg | PACK | Freq: Once | INTRAMUSCULAR | Status: DC | PRN

## 2014-02-04 MED ORDER — INSULIN GLARGINE 100 UNIT/ML ~~LOC~~ SOLN: 35.0000 [IU] | Freq: Every day | SUBCUTANEOUS | Status: DC

## 2014-02-04 NOTE — Patient Instructions (Addendum)
Please increase Lantus to 35 units at bedtime. Please use the NovoLog as follows: - small meal: 15 units - medium meal: 20 units - large meal: 25 units Add the following Sliding scale of Novolog: - 150- 165: + 1 unit  - 166- 180: + 2 units  - 181- 195: + 3 units  - 196- 210: + 4 units  - 211- 225: + 5 units  - 225: + 6 units  Please return in 1 month with your sugar log. Try to schedule this appt in am, and come fasting.  Basic Rules for Patients with Type I Diabetes Mellitus  1. The American Diabetes Association (ADA) recommended targets: - fasting sugar <130 - after meal sugar <180 - HbA1C <7%  2. Engage in ?150 min moderate exercise per week  3. Make sure you have 7-8 h of sleep every night as this helps both blood sugars and your weight.  4. Always keep a sugar log (not only record in your meter) and bring it to all appointments with us.  5. "15-15 rule" for hypoglycemia: if sugars are low, take 15 g of carbs** ("fast sugar" - e.g. 4 glucose tablets, 4 oz orange juice), wait 15 min, then check sugars again. If still <80, repeat. Continue  until your sugars >80, then eat a normal meal.   6. Teach family members and coworkers to inject glucagon. Have a glucagon set at home and one at work. They should call 911 after using the set.  7. If you are on a pump, make sure the basal daily insulin dose is approximately equal (not larger) to the daily insulin you get from boluses, otherwise you are at risk for hypoglycemia.  8. Check sugar before driving. If <100, correct, and only start driving if sugars rise ?161100. Check sugar every hour when on a long drive.  9. Check sugar before exercising. If <100, correct, and only start exercising if sugars rise ?100. Check sugar every hour when on a long exercise routine and 1h after you finished exercising.   If >250, check urine for ketones. If you have moderate-large ketones in urine, do not start exercise. Hydrate yourself with clear  liquids and correct the high sugar. Recheck sugars and ketones before attempting to exercise.  Be aware that you might need less insulin when exercising.  *intense, short, exercise bursts can increase your sugars, but  *less intense, longer (>1h), exercise routines can decrease your sugars.   10. Make sure you have a MedAlert bracelet or pendant mentioning "Type I Diabetes Mellitus". If you have a prior episode of severe hypoglycemia or hypoglycemia unawareness, it should also mention this.  11. Please do not walk barefoot. Inspect your feet for sores/cuts and let us know if you have them.  12. Please call [facility] with any questions and concerns [phone]).   **E.g. of "fast carbs":   first choice (15 g):  1 tube glucose gel, GlucoPouch 15, 2 oz glucose liquid   second choice (15-16 g):  3 or 4 glucose tablets (best taken  with water), 15 Dextrose Bits chewable   third choice (15-20 g):   cup fruit juice,  cup regular soda, 1 cup skim milk,  1 cup sports drink   fourth choice (15-20 g):  1 small tube [place] gel (not frosting), 2 tbsp raisins, 1 tbsp table sugar,  candy, jelly beans, gum drops - check package for carb amount   (adapted from: Juluis RainierMcCall A.L. "Insulin therapy and hypoglycemia" Endocrinol Metab Clin  N Am 2012, 41: 57-87)

## 2014-02-04 NOTE — Progress Notes (Signed)

## 2014-02-04 NOTE — Progress Notes (Addendum)
Patient ID: LORAL CAMPI, female   DOB: [birth year], 28 y.o.   MRN: [id number]  HPI: Christie [name] is a 28 y.o.-year-old female, self-referred for management of DM1, uncontrolled, with complications (DKA admissions).  Patient has been diagnosed with diabetes in 2012; she started on insulin at dx.   She is interested in an insulin pump.  Last hemoglobin A1c was: Lab Results  Component Value Date   HGBA1C 8.0* 04/10/2012   HGBA1C 7.4* 12/17/2011   HGBA1C 10.2* 06/18/2011   Pt is on:  - Lantus 30 units at night - misses it 1-2x a week - Novolog 0-25 units tid ac  - Meter: ReliOn   Pt checks her sugars 2-4 a day and they are: - am: 191-311 - 2h after b'fast: 170-250 - before lunch: n/c - 2h after lunch: n/c - before dinner: 280-400 - 2h after dinner: n/c - bedtime: 200-300s No lows. Lowest sugar was 80; she has hypoglycemia awareness at 80-90. No previous hypoglycemia admission. Does not have a glucagon kit at home. Highest sugar was HI. + 2 previous DKA admissions - last 2 weeks ago.   She has group nutrition classes.   Pt's meals are: - Breakfast: eggs + sausage, yoghurt, grits - Lunch: [place] sandwich + crackers or cheese stick - Dinner: pasta or rice (20-25 units) - Snacks: Drinks a lot of Vitamin water.  - no CKD, last BUN/creatinine:  Lab Results  Component Value Date   BUN 9 01/21/2014   CREATININE 0.72 01/21/2014   - last eye exam was in 01/28/2014. No DR.  - no numbness and tingling in her feet.  Last TSH: Lab Results  Component Value Date   TSH 2.698 04/10/2012   Pt has FH of DM in mother and GM - type 2.   ROS: Constitutional: + both weight gain/loss, + fatigue, + both subjective hyperthermia/hypothermia, + poor sleep, + nocturia Eyes: no blurry vision, no xerophthalmia ENT: + sore throat, no nodules palpated in throat, no dysphagia/odynophagia, no hoarseness Cardiovascular: no CP/+ SOB/no palpitations/+ leg swelling Respiratory: no cough/+  SOB Gastrointestinal: no N/V/D/C Musculoskeletal: + muscle/no joint aches Skin: no rashes Neurological: no tremors/numbness/tingling/dizziness, + HA Psychiatric: no depression/anxiety  Past Medical History  Diagnosis Date  . Asthma   . Hx of migraines   . Anemia   . Depression   . Fx ankle     history of left ankle fx at age 65  . Pregnancy induced hypertension   . Diabetes mellitus     type 1 on insulin  . Headache(784.0)   . Urinary tract infection   . Chlamydia   . Abnormal Pap smear     colpo scheduled  . Anxiety    Past Surgical History  Procedure Laterality Date  . Wisdom tooth extraction    . Colposcopy     History   Social History  . Marital Status: Single    Spouse Name: N/A    Number of Children: 2   Occupational History  . n/a   Social History Main Topics  . Smoking status: Never Smoker   . Smokeless tobacco: Never Used  . Alcohol Use: Yes     Comment: social drinker - wine, liquor once a mo (3-5 drinks)  . Drug Use: No     Comment: last used 1 years ago   Current Outpatient Prescriptions on File Prior to Visit  Medication Sig Dispense Refill  . cetirizine (ZYRTEC) 10 MG tablet Take 10 mg by mouth daily as  needed for allergies.      Marland Kitchen etonogestrel-ethinyl estradiol (NUVARING) 0.12-0.015 MG/24HR vaginal ring Insert vaginally and leave in place for 3 consecutive weeks, then remove for 1 week.  1 each  12  . insulin aspart (NOVOLOG) 100 UNIT/ML injection Inject 1-15 Units into the skin 3 (three) times daily with meals.  10 mL  11  . insulin glargine (LANTUS) 100 UNIT/ML injection Inject 30 Units into the skin daily with breakfast.        No current facility-administered medications on file prior to visit.   Allergies  Allergen Reactions  . Banana Anaphylaxis  . Citrus Other (See Comments)    Gums bleed  . Adhesive [Tape] Rash   Family History  Problem Relation Age of Onset  . Asthma Mother   . Diabetes Mother   . Hypertension Mother   .  Asthma Father   . Anesthesia problems Neg Hx   . Hypertension Sister   . Diabetes Maternal Grandmother   . Cancer Maternal Grandmother     breast  . Obesity Other   . Sleep apnea Other    PE: BP 106/78  Pulse 108  Temp(Src) 99 F (37.2 C) (Oral)  Resp 12  Ht $R'5\' 9"'nF$  (1.753 m)  Wt 195 lb 12.8 oz (88.814 kg)  BMI 28.90 kg/m2  SpO2 99%  LMP 01/21/2014 Wt Readings from Last 3 Encounters:  02/04/14 195 lb 12.8 oz (88.814 kg)  01/30/14 198 lb 11.2 oz (90.13 kg)  01/25/14 194 lb 6.4 oz (88.179 kg)   Constitutional: overweight, in NAD Eyes: PERRLA, EOMI, no exophthalmos ENT: moist mucous membranes, no thyromegaly, no cervical lymphadenopathy Cardiovascular: RRR, No MRG Respiratory: CTA B Gastrointestinal: abdomen soft, NT, ND, BS+ Musculoskeletal: no deformities, strength intact in all 4 Skin: moist, warm, no rashes, multiple tatoos Neurological: no tremor with outstretched hands, DTR normal in all 4  ASSESSMENT: 1. DM1, uncontrolled, without complications - GAD Ab's >30 (06/18/2011)  PLAN:  1. Patient with long-standing, uncontrolled DM1, on insulin therapy. We discussed about an insulin pump, which is a good choice for her, but I will ned to document compliance with visits, meds and sugar checks before we can start this process. We also need a C-peptide but she needs to be <220 and she just ate lunch today. I advised to schedule the next visit fasting, in am. - We discussed about changes to his insulin regimen:  Patient Instructions  Please increase Lantus to 35 units at bedtime. Please use the NovoLog as follows: - small meal: 15 units - medium meal: 20 units - large meal: 25 units Add the following Sliding scale of Novolog: - 150- 165: + 1 unit  - 166- 180: + 2 units  - 181- 195: + 3 units  - 196- 210: + 4 units  - 211- 225: + 5 units  - 225: + 6 units  Please return in 1 month with your sugar log. Try to schedule this appt in am, and come fasting. - stressed the  importance of not skipping insulin doses - Strongly advised her to check sugars at different times of the day - check at least 3-4 times a day, rotating checks - given sugar log and advised how to fill it and to bring it at next appt  - given foot care handout and explained the principles  - given instructions for hypoglycemia management "15-15 rule"  - advised for yearly eye exams - sent glucagon kit Rx to pharmacy - advised to get  ketone strips - advised to always have Glu tablets with her - advised for a Med-alert bracelet mentioning "type 1 diabetes mellitus". - will need to refer to DM education for pre-pump visit. I am hoping she will have carb counting training during her nutrition visits - given instruction Re: exercising and driving in DM1 (pt instructions) - no signs of other autoimmune disorders - will check a TSH - check A1c, ACR - Return to clinic in 1 mo with sugar log   Office Visit on 02/04/2014  Component Date Value Ref Range Status  . HM Diabetic Eye Exam 01/29/2014 No Retinopathy  No Retinopathy Final  . TSH 02/04/2014 1.03  0.35 - 4.50 uIU/mL Final  . Hemoglobin A1C 02/04/2014 13.3* 4.6 - 6.5 % Final   Glycemic Control Guidelines for People with Diabetes:Non Diabetic:  <6%Goal of Therapy: <7%Additional Action Suggested:  >8%   . Microalb, Ur 02/04/2014 0.2  0.0 - 1.9 mg/dL Final  . Creatinine,U 02/04/2014 106.1   Final  . Microalb Creat Ratio 02/04/2014 0.2  0.0 - 30.0 mg/g Final   Msg sent: Dear Ms Mcnabb, The HbA1c is very high.  The thyroid test is normal, and you do not have proteins in your urine, which is great. Sincerely, Philemon Kingdom MD

## 2014-02-05 LAB — TSH: TSH: 1.03 u[IU]/mL (ref 0.35–4.50)

## 2014-02-11 ENCOUNTER — Ambulatory Visit: Payer: Medicaid Other

## 2014-02-25 ENCOUNTER — Other Ambulatory Visit: Payer: Self-pay | Admitting: Internal Medicine

## 2014-02-25 DIAGNOSIS — E1065 Type 1 diabetes mellitus with hyperglycemia: Secondary | ICD-10-CM

## 2014-02-25 DIAGNOSIS — E108 Type 1 diabetes mellitus with unspecified complications: Principal | ICD-10-CM

## 2014-02-25 DIAGNOSIS — IMO0002 Reserved for concepts with insufficient information to code with codable children: Secondary | ICD-10-CM

## 2014-03-23 ENCOUNTER — Ambulatory Visit: Payer: Medicaid Other | Admitting: Internal Medicine

## 2014-03-25 ENCOUNTER — Telehealth: Payer: Self-pay | Admitting: *Deleted

## 2014-03-25 NOTE — Telephone Encounter (Signed)
Patient called and stated that she has been having a period for close to a month on and off. I asked her about how she is using the nuva ring and she stated that she wears it for 3 weeks and puts a new one in. She doesn't allow one week for her period. Advised patient on appropriate use of nuva ring to wear it for 3 weeks and to take out for one week. Patient voiced understanding. Spoke with Eino Farber about this as well and she advised patient give her body time and also to begin taking ring out for a week after wearing it for 3 weeks. If bleeding not better by next week to call us and we may need to give her pills to help get her past the bleeding. Patient is agreeable to this.

## 2014-05-10 ENCOUNTER — Encounter: Payer: Self-pay | Admitting: Internal Medicine

## 2014-05-14 ENCOUNTER — Encounter (HOSPITAL_BASED_OUTPATIENT_CLINIC_OR_DEPARTMENT_OTHER): Payer: Self-pay | Admitting: *Deleted

## 2014-05-14 ENCOUNTER — Emergency Department (HOSPITAL_BASED_OUTPATIENT_CLINIC_OR_DEPARTMENT_OTHER)
Admission: EM | Admit: 2014-05-14 | Discharge: 2014-05-14 | Disposition: A | Discharge status: Home / Self Care | Payer: Medicaid Other | Attending: Emergency Medicine | Admitting: Emergency Medicine

## 2014-05-14 ENCOUNTER — Emergency Department (HOSPITAL_BASED_OUTPATIENT_CLINIC_OR_DEPARTMENT_OTHER): Payer: Medicaid Other

## 2014-05-14 DIAGNOSIS — Z794 Long term (current) use of insulin: Secondary | ICD-10-CM | POA: Diagnosis not present

## 2014-05-14 DIAGNOSIS — R739 Hyperglycemia, unspecified: Secondary | ICD-10-CM

## 2014-05-14 DIAGNOSIS — Z8659 Personal history of other mental and behavioral disorders: Secondary | ICD-10-CM | POA: Diagnosis not present

## 2014-05-14 DIAGNOSIS — Z87440 Personal history of urinary (tract) infections: Secondary | ICD-10-CM | POA: Diagnosis not present

## 2014-05-14 DIAGNOSIS — Z8781 Personal history of (healed) traumatic fracture: Secondary | ICD-10-CM | POA: Diagnosis not present

## 2014-05-14 DIAGNOSIS — Z8619 Personal history of other infectious and parasitic diseases: Secondary | ICD-10-CM | POA: Diagnosis not present

## 2014-05-14 DIAGNOSIS — R52 Pain, unspecified: Secondary | ICD-10-CM

## 2014-05-14 DIAGNOSIS — Z8679 Personal history of other diseases of the circulatory system: Secondary | ICD-10-CM | POA: Diagnosis not present

## 2014-05-14 DIAGNOSIS — E1165 Type 2 diabetes mellitus with hyperglycemia: Secondary | ICD-10-CM | POA: Insufficient documentation

## 2014-05-14 DIAGNOSIS — J029 Acute pharyngitis, unspecified: Secondary | ICD-10-CM | POA: Diagnosis not present

## 2014-05-14 DIAGNOSIS — Z79899 Other long term (current) drug therapy: Secondary | ICD-10-CM | POA: Diagnosis not present

## 2014-05-14 DIAGNOSIS — J45909 Unspecified asthma, uncomplicated: Secondary | ICD-10-CM | POA: Insufficient documentation

## 2014-05-14 DIAGNOSIS — D649 Anemia, unspecified: Secondary | ICD-10-CM | POA: Diagnosis not present

## 2014-05-14 LAB — CBC WITH DIFFERENTIAL/PLATELET
BASOS PCT: 0 % (ref 0–1)
Basophils Absolute: 0 10*3/uL (ref 0.0–0.1)
EOS PCT: 1 % (ref 0–5)
Eosinophils Absolute: 0.1 10*3/uL (ref 0.0–0.7)
HEMATOCRIT: 32.3 % — AB (ref 36.0–46.0)
HEMOGLOBIN: 10.3 g/dL — AB (ref 12.0–15.0)
LYMPHS ABS: 2.2 10*3/uL (ref 0.7–4.0)
Lymphocytes Relative: 35 % (ref 12–46)
MCH: 21.5 pg — AB (ref 26.0–34.0)
MCHC: 31.9 g/dL (ref 30.0–36.0)
MCV: 67.4 fL — AB (ref 78.0–100.0)
MONOS PCT: 9 % (ref 3–12)
Monocytes Absolute: 0.6 10*3/uL (ref 0.1–1.0)
Neutro Abs: 3.5 10*3/uL (ref 1.7–7.7)
Neutrophils Relative %: 55 % (ref 43–77)
Platelets: 201 10*3/uL (ref 150–400)
RBC: 4.79 MIL/uL (ref 3.87–5.11)
RDW: 17 % — ABNORMAL HIGH (ref 11.5–15.5)
WBC: 6.4 10*3/uL (ref 4.0–10.5)

## 2014-05-14 LAB — CBG MONITORING, ED
GLUCOSE-CAPILLARY: 279 mg/dL — AB (ref 70–99)
Glucose-Capillary: 372 mg/dL — ABNORMAL HIGH (ref 70–99)

## 2014-05-14 LAB — RAPID STREP SCREEN (MED CTR MEBANE ONLY): STREPTOCOCCUS, GROUP A SCREEN (DIRECT): NEGATIVE

## 2014-05-14 LAB — BASIC METABOLIC PANEL
Anion gap: 13 (ref 5–15)
BUN: 8 mg/dL (ref 6–23)
CALCIUM: 9.2 mg/dL (ref 8.4–10.5)
CHLORIDE: 99 meq/L (ref 96–112)
CO2: 26 meq/L (ref 19–32)
Creatinine, Ser: 0.6 mg/dL (ref 0.50–1.10)
GFR calc Af Amer: >90 mL/min (ref >90)
GFR calc non Af Amer: >90 mL/min (ref >90)
GLUCOSE: 354 mg/dL — AB (ref 70–99)
Potassium: 4.1 mEq/L (ref 3.7–5.3)
Sodium: 138 mEq/L (ref 137–147)

## 2014-05-14 MED ORDER — SODIUM CHLORIDE 0.9 % IV BOLUS (SEPSIS)
1000.0000 mL | Freq: Once | INTRAVENOUS | Status: AC
Start: 2014-05-14 — End: 2014-05-14
  Administered 2014-05-14: 1000 mL via INTRAVENOUS

## 2014-05-14 MED ORDER — IOHEXOL 300 MG/ML  SOLN
75.0000 mL | Freq: Once | INTRAMUSCULAR | Status: AC | PRN
  Administered 2014-05-14: 75 mL via INTRAVENOUS

## 2014-05-14 MED ORDER — PENICILLIN V POTASSIUM 250 MG PO TABS
500.0000 mg | ORAL_TABLET | Freq: Once | ORAL | Status: AC
  Administered 2014-05-14: 500 mg via ORAL
  Filled 2014-05-14: qty 2

## 2014-05-14 MED ORDER — PENICILLIN V POTASSIUM 250 MG PO TABS: 500.0000 mg | ORAL_TABLET | Freq: Once | ORAL | Status: DC

## 2014-05-14 MED ORDER — PENICILLIN V POTASSIUM 500 MG PO TABS: 500.0000 mg | ORAL_TABLET | Freq: Three times a day (TID) | ORAL | Status: DC

## 2014-05-14 MED ORDER — ACETAMINOPHEN 325 MG PO TABS
650.0000 mg | ORAL_TABLET | Freq: Once | ORAL | Status: AC
  Administered 2014-05-14: 650 mg via ORAL
  Filled 2014-05-14: qty 2

## 2014-05-14 NOTE — ED Notes (Signed)
Waiting for creatine to result before CT scan.

## 2014-05-14 NOTE — ED Notes (Signed)
C/o sorethroat since 1 wk ago. No cough. No other sx.

## 2014-05-14 NOTE — Discharge Instructions (Signed)

## 2014-05-14 NOTE — ED Provider Notes (Signed)
CSN: [id number]     Arrival date & time 05/14/14  [id number] History   First MD Initiated Contact with Patient 05/14/14 [phone]     Chief Complaint  Patient presents with  . Sore Throat     (Consider location/radiation/quality/duration/timing/severity/associated sxs/prior Treatment) HPI  This is a 28 year old female with insulin-dependent diabetes who presents today complaining of sore throat for one week. She states that it was gradual in an onset. She has not noted fever or chills. She has some pain with swallowing. She states that her blood sugars are high because she has had pain with swallowing and has not been taking solid foods but has been using her normal insulin. She has a history of insulin dependent diabetes and DKA in the past. She is unable to do her blood sugars have been recently. She has not had any other symptoms besides the sore throat. He has been taking fluids without difficulty. She denies headache, vision change, nasal congestion, neck pain, abdominal pain, nausea, vomiting, or diarrhea.she has no known sick exposures.  Past Medical History  Diagnosis Date  . Asthma   . Hx of migraines   . Anemia   . Depression   . Fx ankle     history of left ankle fx at age 28  . Pregnancy induced hypertension   . Diabetes mellitus     type 1 on insulin  . Headache(784.0)   . Urinary tract infection   . Chlamydia   . Abnormal Pap smear     colpo scheduled  . Anxiety    Past Surgical History  Procedure Laterality Date  . Wisdom tooth extraction    . Colposcopy     Family History  Problem Relation Age of Onset  . Asthma Mother   . Diabetes Mother   . Hypertension Mother   . Asthma Father   . Anesthesia problems Neg Hx   . Hypertension Sister   . Diabetes Maternal Grandmother   . Cancer Maternal Grandmother     breast  . Obesity Other   . Sleep apnea Other    History  Substance Use Topics  . Smoking status: Never Smoker   . Smokeless tobacco: Never Used  . Alcohol  Use: Yes     Comment: social drinker   OB History    Gravida Para Term Preterm AB TAB SAB Ectopic Multiple Living   3 2 2  0 1 1 0 0 0 2     Review of Systems  All other systems reviewed and are negative.     Allergies  Banana; Citrus; and Adhesive  Home Medications   Prior to Admission medications   Medication Sig Start Date End Date Taking? Authorizing Provider  cetirizine (ZYRTEC) 10 MG tablet Take 10 mg by mouth daily as needed for allergies.   Yes Historical Provider, MD  etonogestrel-ethinyl estradiol (NUVARING) 0.12-0.015 MG/24HR vaginal ring Insert vaginally and leave in place for 3 consecutive weeks, then remove for 1 week. 10/05/13  Yes Tereso NewcomerUgonna A Anyanwu, MD  glucagon (GLUCAGON EMERGENCY) 1 MG injection Inject 1 mg into the muscle once as needed. 02/04/14  Yes Carlus Pavlovristina Gherghe, MD  insulin aspart (NOVOLOG) 100 UNIT/ML injection Inject 15-25 Units into the skin 3 (three) times daily with meals. 02/04/14  Yes Carlus Pavlovristina Gherghe, MD  insulin glargine (LANTUS) 100 UNIT/ML injection Inject 0.35 mLs (35 Units total) into the skin daily with breakfast. 02/04/14  Yes Carlus Pavlovristina Gherghe, MD  Insulin Pen Needle 31G X 8 MM MISC 1 Device by  Does not apply route. 07/06/13  Yes Historical Provider, MD   BP 117/83 mmHg  Pulse 75  Temp(Src) 99 F (37.2 C) (Oral)  Resp 18  Ht 5\' 9"  (1.753 m)  Wt 200 lb (90.719 kg)  BMI 29.52 kg/m2  SpO2 100%  LMP 05/10/2014 Physical Exam  Constitutional: She is oriented to person, place, and time. She appears well-developed and well-nourished.  HENT:  Head: Normocephalic and atraumatic.    Right Ear: External ear normal.  Left Ear: External ear normal.  Nose: Nose normal.  Mouth/Throat: Oropharynx is clear and moist.  Thyroid normal to palpation tenderness to the right of the trachea but no swelling or fluctuance or erythema noted.  Eyes: Conjunctivae and EOM are normal. Pupils are equal, round, and reactive to light.  Neck: Normal range of motion.  Neck supple.  Cardiovascular: Normal rate, regular rhythm, normal heart sounds and intact distal pulses.   Pulmonary/Chest: Effort normal and breath sounds normal.  Abdominal: Soft. Bowel sounds are normal.  Musculoskeletal: Normal range of motion.  Neurological: She is alert and oriented to person, place, and time. She has normal reflexes.  Skin: Skin is warm and dry.  Psychiatric: She has a normal mood and affect. Her behavior is normal. Judgment and thought content normal.  Nursing note and vitals reviewed.   ED Course  Procedures (including critical care time) Labs Review Labs Reviewed  CBC WITH DIFFERENTIAL - Abnormal; Notable for the following:    Hemoglobin 10.3 (*)    HCT 32.3 (*)    MCV 67.4 (*)    MCH 21.5 (*)    RDW 17.0 (*)    All other components within normal limits  BASIC METABOLIC PANEL - Abnormal; Notable for the following:    Glucose, Bld 354 (*)    All other components within normal limits  CBG MONITORING, ED - Abnormal; Notable for the following:    Glucose-Capillary 372 (*)    All other components within normal limits  CBG MONITORING, ED - Abnormal; Notable for the following:    Glucose-Capillary 279 (*)    All other components within normal limits  RAPID STREP SCREEN  CULTURE, GROUP A STREP    Imaging Review Ct Soft Tissue Neck W Contrast  05/14/2014   CLINICAL DATA:  Neck pain.  Painful swallowing.  EXAM: CT NECK WITH CONTRAST  TECHNIQUE: Multidetector CT imaging of the neck was performed using the standard protocol following the bolus administration of intravenous contrast.  CONTRAST:  75mL OMNIPAQUE IOHEXOL 300 MG/ML  SOLN  COMPARISON:  None.  FINDINGS: Mild enlargement of the adenoids bilaterally. Mild symmetric enlargement of the tonsils without evidence of acute inflammation or abscess. No mass lesion.  Parotid and submandibular glands are normal. Thyroid is normal. Larynx is normal.  Shoddy adenopathy in the neck. Largest node is a right level 2 node  measuring 13 mm. Multiple subcentimeter posterior lymph nodes bilaterally. Left level 2 lymph node 8 mm. Lung apices are clear.  IMPRESSION: Mild enlargement of the tonsils and adenoids bilaterally without abscess. Shoddy adenopathy in the neck. Findings are most consistent with pharyngitis.   Electronically Signed   By: Marlan Palau M.D.   On: 05/14/2014 11:32     EKG Interpretation None      MDM   Final diagnoses:  Pain  Pharyngitis  Hyperglycemia  Anemia, unspecified anemia type    This is an insulin-dependent diabetic with a sore throat. Strep screen is negative and cultures sent. Patient started here on penicillin.  IV fluids were infused due to initially elevated blood sugar at 372. After 1 L normal saline her blood sugars 279. Her labwork reveals no evidence of DKA. She is anemic and this is down 1 g from previous. 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 She is on birth control. I discussed need for follow-up with the patient and she voices understanding.   Hilario Quarryanielle S Dula Havlik, MD 05/14/14 [phone]

## 2014-05-17 LAB — CULTURE, GROUP A STREP

## 2014-05-18 ENCOUNTER — Telehealth: Payer: Self-pay

## 2014-05-18 DIAGNOSIS — B379 Candidiasis, unspecified: Secondary | ICD-10-CM

## 2014-05-18 MED ORDER — FLUCONAZOLE 150 MG PO TABS: 150.0000 mg | ORAL_TABLET | Freq: Once | ORAL | Status: DC

## 2014-05-18 NOTE — Telephone Encounter (Signed)
Patient called stating she was recently put on PCN for 2 week course (currently in second week). States she has developed a  Yeast infection and is experiencing vaginal itching and a thick white discharge-- explains she is very uncomfortable. Informed patient I would send Diflucan to pharmacy. Patient verbalized understanding and gratitude. No questions or concerns.

## 2014-06-08 ENCOUNTER — Telehealth: Payer: Self-pay | Admitting: *Deleted

## 2014-06-08 DIAGNOSIS — B379 Candidiasis, unspecified: Secondary | ICD-10-CM

## 2014-06-08 MED ORDER — FLUCONAZOLE 150 MG PO TABS: 150.0000 mg | ORAL_TABLET | Freq: Once | ORAL | Status: DC

## 2014-06-08 NOTE — Telephone Encounter (Signed)
Patient called requesting med for yeast infection. Rx sent in per protocol.

## 2014-07-06 ENCOUNTER — Other Ambulatory Visit: Payer: Self-pay | Admitting: *Deleted

## 2014-07-06 DIAGNOSIS — B379 Candidiasis, unspecified: Secondary | ICD-10-CM

## 2014-07-06 MED ORDER — FLUCONAZOLE 150 MG PO TABS: 150.0000 mg | ORAL_TABLET | Freq: Once | ORAL | Status: DC

## 2014-09-06 ENCOUNTER — Encounter: Payer: Self-pay | Admitting: Obstetrics & Gynecology

## 2014-09-06 ENCOUNTER — Ambulatory Visit (INDEPENDENT_AMBULATORY_CARE_PROVIDER_SITE_OTHER): Payer: Medicaid Other | Admitting: Obstetrics & Gynecology

## 2014-09-06 VITALS — BP 113/77 | HR 85 | Ht 69.0 in | Wt 206.3 lb

## 2014-09-06 DIAGNOSIS — Z113 Encounter for screening for infections with a predominantly sexual mode of transmission: Secondary | ICD-10-CM

## 2014-09-06 NOTE — Patient Instructions (Signed)
Safe Sex °Safe sex is about reducing the risk of giving or getting a sexually transmitted disease (STD). STDs are spread through sexual contact involving the genitals, mouth, or rectum. Some STDs can be cured and others cannot. Safe sex can also prevent unintended pregnancies.  °WHAT ARE SOME SAFE SEX PRACTICES? °· Limit your sexual activity to only one partner who is having sex with only you. °· Talk to your partner about his or her past partners, past STDs, and drug use. °· Use a condom every time you have sexual intercourse. This includes vaginal, oral, and anal sexual activity. Both females and males should wear condoms during oral sex. Only use latex or polyurethane condoms and water-based lubricants. Using petroleum-based lubricants or oils to lubricate a condom will weaken the condom and increase the chance that it will break. The condom should be in place from the beginning to the end of sexual activity. Wearing a condom reduces, but does not completely eliminate, your risk of getting or giving an STD. STDs can be spread by contact with infected body fluids and skin. °· Get vaccinated for hepatitis B and HPV. °· Avoid alcohol and recreational drugs, which can affect your judgment. You may forget to use a condom or participate in high-risk sex. °· For females, avoid douching after sexual intercourse. Douching can spread an infection farther into the reproductive tract. °· Check your body for signs of sores, blisters, rashes, or unusual discharge. See your health care provider if you notice any of these signs. °· Avoid sexual contact if you have symptoms of an infection or are being treated for an STD. If you or your partner has herpes, avoid sexual contact when blisters are present. Use condoms at all other times. °· If you are at risk of being infected with HIV, it is recommended that you take a prescription medicine daily to prevent HIV infection. This is called pre-exposure prophylaxis (PrEP). You are  considered at risk if: °¨ You are a man who has sex with other men (MSM). °¨ You are a heterosexual man or woman who is sexually active with more than one partner. °¨ You take drugs by injection. °¨ You are sexually active with a partner who has HIV. °· Talk with your health care provider about whether you are at high risk of being infected with HIV. If you choose to begin PrEP, you should first be tested for HIV. You should then be tested every 3 months for as long as you are taking PrEP. °· See your health care provider for regular screenings, exams, and tests for other STDs. Before having sex with a new partner, each of you should be screened for STDs and should talk about the results with each other. °WHAT ARE THE BENEFITS OF SAFE SEX?  °· There is less chance of getting or giving an STD. °· You can prevent unwanted or unintended pregnancies. °· By discussing safe sex concerns with your partner, you may increase feelings of intimacy, comfort, trust, and honesty between the two of you. °Document Released: 08/02/2004 Document Revised: 11/09/2013 Document Reviewed: 12/17/2011 °ExitCare® Patient Information ©2015 [employer]. This information is not intended to replace advice given to you by your health care provider. Make sure you discuss any questions you have with your health care provider. ° °Sexually Transmitted Disease °A sexually transmitted disease (STD) is a disease or infection that may be passed (transmitted) from person to person, usually during sexual activity. This may happen by way of saliva, semen, blood,   vaginal mucus, or urine. Common STDs include:  °· Gonorrhea.   °· Chlamydia.   °· Syphilis.   °· HIV and AIDS.   °· Genital herpes.   °· Hepatitis B and C.   °· Trichomonas.   °· Human papillomavirus (HPV).   °· Pubic lice.   °· Scabies. °· Mites. °· Bacterial vaginosis. °WHAT ARE CAUSES OF STDs? °An STD may be caused by bacteria, a virus, or parasites. STDs are often transmitted during sexual  activity if one person is infected. However, they may also be transmitted through nonsexual means. STDs may be transmitted after:  °· Sexual intercourse with an infected person.   °· Sharing sex toys with an infected person.   °· Sharing needles with an infected person or using unclean piercing or tattoo needles. °· Having intimate contact with the genitals, mouth, or rectal areas of an infected person.   °· Exposure to infected fluids during birth. °WHAT ARE THE SIGNS AND SYMPTOMS OF STDs? °Different STDs have different symptoms. Some people may not have any symptoms. If symptoms are present, they may include:  °· Painful or bloody urination.   °· Pain in the pelvis, abdomen, vagina, anus, throat, or eyes.   °· A skin rash, itching, or irritation. °· Growths, ulcerations, blisters, or sores in the genital and anal areas. °· Abnormal vaginal discharge with or without bad odor.   °· Penile discharge in men.   °· Fever.   °· Pain or bleeding during sexual intercourse.   °· Swollen glands in the groin area.   °· Yellow skin and eyes (jaundice). This is seen with hepatitis.   °· Swollen testicles. °· Infertility. °· Sores and blisters in the mouth. °HOW ARE STDs DIAGNOSED? °To make a diagnosis, your health care provider may:  °· Take a medical history.   °· Perform a physical exam.   °· Take a sample of any discharge to examine. °· Swab the throat, cervix, opening to the penis, rectum, or vagina for testing. °· Test a sample of your first morning urine.   °· Perform blood tests.   °· Perform a Pap test, if this applies.   °· Perform a colposcopy.   °· Perform a laparoscopy.   °HOW ARE STDs TREATED? ° Treatment depends on the STD. Some STDs may be treated but not cured.  °· Chlamydia, gonorrhea, trichomonas, and syphilis can be cured with antibiotic medicine.   °· Genital herpes, hepatitis, and HIV can be treated, but not cured, with prescribed medicines. The medicines lessen symptoms.   °· Genital warts from HPV can be  treated with medicine or by freezing, burning (electrocautery), or surgery. Warts may come back.   °· HPV cannot be cured with medicine or surgery. However, abnormal areas may be removed from the cervix, vagina, or vulva.   °· If your diagnosis is confirmed, your recent sexual partners need treatment. This is true even if they are symptom-free or have a negative culture or evaluation. They should not have sex until their health care providers say it is okay. °HOW CAN I REDUCE MY RISK OF GETTING AN STD? °Take these steps to reduce your risk of getting an STD: °· Use latex condoms, dental dams, and water-soluble lubricants during sexual activity. Do not use petroleum jelly or oils. °· Avoid having multiple sex partners. °· Do not have sex with someone who has other sex partners. °· Do not have sex with anyone you do not know or who is at high risk for an STD. °· Avoid risky sex practices that can break your skin. °· Do not have sex if you have open sores on your mouth or skin. °· Avoid drinking too   much alcohol or taking illegal drugs. Alcohol and drugs can affect your judgment and put you in a vulnerable position. °· Avoid engaging in oral and anal sex acts. °· Get vaccinated for HPV and hepatitis. If you have not received these vaccines in the past, talk to your health care provider about whether one or both might be right for you.   °· If you are at risk of being infected with HIV, it is recommended that you take a prescription medicine daily to prevent HIV infection. This is called pre-exposure prophylaxis (PrEP). You are considered at risk if: °¨ You are a man who has sex with other men (MSM). °¨ You are a heterosexual man or woman and are sexually active with more than one partner. °¨ You take drugs by injection. °¨ You are sexually active with a partner who has HIV. °· Talk with your health care provider about whether you are at high risk of being infected with HIV. If you choose to begin PrEP, you should first  be tested for HIV. You should then be tested every 3 months for as long as you are taking PrEP.   °WHAT SHOULD I DO IF I THINK I HAVE AN STD? °· See your health care provider.   °· Tell your sexual partner(s). They should be tested and treated for any STDs. °· Do not have sex until your health care provider says it is okay.  °WHEN SHOULD I GET IMMEDIATE MEDICAL CARE? °Contact your health care provider right away if:  °· You have severe abdominal pain. °· You are a man and notice swelling or pain in your testicles. °· You are a woman and notice swelling or pain in your vagina. °Document Released: 09/15/2002 Document Revised: 06/30/2013 Document Reviewed: 01/13/2013 °Exit[employer]® Patient Information ©2015 [employer]. This information is not intended to replace advice given to you by your health care provider. Make sure you discuss any questions you have with your health care provider. ° °

## 2014-09-06 NOTE — Progress Notes (Signed)
Subjective:     Patient ID: Christie [name], female   DOB: [birth year], 29 y.o.   MRN: [id number]  HPIPt present for STI screen.  She denies abnormal sx and denies contacts with any with an STI.     Review of Systems     Objective:   Physical Exam BP 113/77 mmHg  Pulse 85  Ht 5\' 9"  (1.753 m)  Wt 206 lb 4.8 oz (93.577 kg)  BMI 30.45 kg/m2  LMP 08/15/2014 (Exact Date) GU: EGBUS: no lesions Vagina: no blood in vault Cervix: no lesion; no mucopurulent d/c       Assessment:     STI screen      Plan:     Blood tests: HIV, RPR F/u GC and chlamydia F/u in 01/2015 for PAP

## 2014-09-07 ENCOUNTER — Other Ambulatory Visit: Payer: Self-pay | Admitting: Obstetrics & Gynecology

## 2014-09-07 ENCOUNTER — Encounter: Payer: Self-pay | Admitting: Obstetrics & Gynecology

## 2014-09-07 ENCOUNTER — Telehealth: Payer: Self-pay | Admitting: *Deleted

## 2014-09-07 DIAGNOSIS — N760 Acute vaginitis: Principal | ICD-10-CM

## 2014-09-07 DIAGNOSIS — B9689 Other specified bacterial agents as the cause of diseases classified elsewhere: Secondary | ICD-10-CM

## 2014-09-07 LAB — GC/CHLAMYDIA PROBE AMP
CT Probe RNA: NEGATIVE
GC Probe RNA: NEGATIVE

## 2014-09-07 LAB — WET PREP, GENITAL
CLUE CELLS WET PREP: NONE SEEN
Trich, Wet Prep: NONE SEEN
Yeast Wet Prep HPF POC: NONE SEEN

## 2014-09-07 LAB — RPR

## 2014-09-07 LAB — HIV ANTIBODY (ROUTINE TESTING W REFLEX): HIV 1&2 Ab, 4th Generation: NONREACTIVE

## 2014-09-07 MED ORDER — METRONIDAZOLE 500 MG PO TABS
500.0000 mg | ORAL_TABLET | Freq: Two times a day (BID) | ORAL | Status: AC
Start: 2014-09-07 — End: 2014-09-14

## 2014-09-07 NOTE — Telephone Encounter (Signed)
Contacted patient, results given, pt verbalizes understanding and has no further questions. 

## 2014-10-18 ENCOUNTER — Ambulatory Visit: Payer: Medicaid Other | Admitting: Family Medicine

## 2014-10-21 ENCOUNTER — Ambulatory Visit: Payer: Medicaid Other | Admitting: Family Medicine

## 2014-10-25 ENCOUNTER — Other Ambulatory Visit: Payer: Self-pay | Admitting: *Deleted

## 2014-10-25 DIAGNOSIS — Z309 Encounter for contraceptive management, unspecified: Secondary | ICD-10-CM

## 2014-10-25 MED ORDER — ETONOGESTREL-ETHINYL ESTRADIOL 0.12-0.015 MG/24HR VA RING: VAGINAL_RING | VAGINAL | Status: DC

## 2014-11-01 ENCOUNTER — Telehealth: Payer: Self-pay | Admitting: *Deleted

## 2014-11-01 DIAGNOSIS — B379 Candidiasis, unspecified: Secondary | ICD-10-CM

## 2014-11-01 MED ORDER — FLUCONAZOLE 150 MG PO TABS: 150.0000 mg | ORAL_TABLET | Freq: Once | ORAL | Status: DC

## 2014-11-01 NOTE — Telephone Encounter (Signed)
Patient called reporting symptoms of yeast. Called in rx per protocol for Diflucan.

## 2014-11-08 ENCOUNTER — Encounter (HOSPITAL_BASED_OUTPATIENT_CLINIC_OR_DEPARTMENT_OTHER): Payer: Self-pay

## 2014-11-08 ENCOUNTER — Emergency Department (HOSPITAL_BASED_OUTPATIENT_CLINIC_OR_DEPARTMENT_OTHER)
Admission: EM | Admit: 2014-11-08 | Discharge: 2014-11-08 | Disposition: A | Discharge status: Home / Self Care | Payer: Medicaid Other | Attending: Emergency Medicine | Admitting: Emergency Medicine

## 2014-11-08 DIAGNOSIS — R509 Fever, unspecified: Secondary | ICD-10-CM | POA: Diagnosis not present

## 2014-11-08 DIAGNOSIS — Z8679 Personal history of other diseases of the circulatory system: Secondary | ICD-10-CM | POA: Diagnosis not present

## 2014-11-08 DIAGNOSIS — Z794 Long term (current) use of insulin: Secondary | ICD-10-CM | POA: Diagnosis not present

## 2014-11-08 DIAGNOSIS — Z8781 Personal history of (healed) traumatic fracture: Secondary | ICD-10-CM | POA: Insufficient documentation

## 2014-11-08 DIAGNOSIS — R630 Anorexia: Secondary | ICD-10-CM | POA: Diagnosis not present

## 2014-11-08 DIAGNOSIS — Z8619 Personal history of other infectious and parasitic diseases: Secondary | ICD-10-CM | POA: Diagnosis not present

## 2014-11-08 DIAGNOSIS — R110 Nausea: Secondary | ICD-10-CM | POA: Diagnosis not present

## 2014-11-08 DIAGNOSIS — Z87440 Personal history of urinary (tract) infections: Secondary | ICD-10-CM | POA: Insufficient documentation

## 2014-11-08 DIAGNOSIS — Z862 Personal history of diseases of the blood and blood-forming organs and certain disorders involving the immune mechanism: Secondary | ICD-10-CM | POA: Diagnosis not present

## 2014-11-08 DIAGNOSIS — Z8659 Personal history of other mental and behavioral disorders: Secondary | ICD-10-CM | POA: Insufficient documentation

## 2014-11-08 DIAGNOSIS — R1084 Generalized abdominal pain: Secondary | ICD-10-CM | POA: Insufficient documentation

## 2014-11-08 DIAGNOSIS — Z3202 Encounter for pregnancy test, result negative: Secondary | ICD-10-CM | POA: Diagnosis not present

## 2014-11-08 DIAGNOSIS — R61 Generalized hyperhidrosis: Secondary | ICD-10-CM | POA: Diagnosis not present

## 2014-11-08 DIAGNOSIS — J45909 Unspecified asthma, uncomplicated: Secondary | ICD-10-CM | POA: Insufficient documentation

## 2014-11-08 DIAGNOSIS — E109 Type 1 diabetes mellitus without complications: Secondary | ICD-10-CM | POA: Diagnosis not present

## 2014-11-08 LAB — COMPREHENSIVE METABOLIC PANEL
ALT: 14 U/L (ref 14–54)
ANION GAP: 7 (ref 5–15)
AST: 22 U/L (ref 15–41)
Albumin: 3.7 g/dL (ref 3.5–5.0)
Alkaline Phosphatase: 56 U/L (ref 38–126)
BUN: 10 mg/dL (ref 6–20)
CO2: 24 mmol/L (ref 22–32)
Calcium: 9.2 mg/dL (ref 8.9–10.3)
Chloride: 102 mmol/L (ref 101–111)
Creatinine, Ser: 0.72 mg/dL (ref 0.44–1.00)
GFR calc Af Amer: >60 mL/min (ref >60)
Glucose, Bld: 305 mg/dL — ABNORMAL HIGH (ref 70–99)
Potassium: 3.7 mmol/L (ref 3.5–5.1)
SODIUM: 133 mmol/L — AB (ref 135–145)
Total Bilirubin: 0.3 mg/dL (ref 0.3–1.2)
Total Protein: 7.4 g/dL (ref 6.5–8.1)

## 2014-11-08 LAB — URINALYSIS, ROUTINE W REFLEX MICROSCOPIC
Bilirubin Urine: NEGATIVE
HGB URINE DIPSTICK: NEGATIVE
Ketones, ur: NEGATIVE mg/dL
LEUKOCYTES UA: NEGATIVE
Nitrite: NEGATIVE
PROTEIN: NEGATIVE mg/dL
Specific Gravity, Urine: >1.046 — ABNORMAL HIGH (ref 1.005–1.030)
Urobilinogen, UA: 0.2 mg/dL (ref 0.0–1.0)
pH: 5.5 (ref 5.0–8.0)

## 2014-11-08 LAB — CBC WITH DIFFERENTIAL/PLATELET
BASOS PCT: 0 % (ref 0–1)
Basophils Absolute: 0 10*3/uL (ref 0.0–0.1)
EOS ABS: 0 10*3/uL (ref 0.0–0.7)
EOS PCT: 1 % (ref 0–5)
HCT: 35 % — ABNORMAL LOW (ref 36.0–46.0)
HEMOGLOBIN: 11.3 g/dL — AB (ref 12.0–15.0)
Lymphocytes Relative: 46 % (ref 12–46)
Lymphs Abs: 2 10*3/uL (ref 0.7–4.0)
MCH: 22.8 pg — AB (ref 26.0–34.0)
MCHC: 32.3 g/dL (ref 30.0–36.0)
MCV: 70.7 fL — AB (ref 78.0–100.0)
Monocytes Absolute: 0.4 10*3/uL (ref 0.1–1.0)
Monocytes Relative: 8 % (ref 3–12)
NEUTROS ABS: 2 10*3/uL (ref 1.7–7.7)
NEUTROS PCT: 45 % (ref 43–77)
Platelets: 200 10*3/uL (ref 150–400)
RBC: 4.95 MIL/uL (ref 3.87–5.11)
RDW: 17 % — ABNORMAL HIGH (ref 11.5–15.5)
WBC: 4.3 10*3/uL (ref 4.0–10.5)

## 2014-11-08 LAB — PREGNANCY, URINE: Preg Test, Ur: NEGATIVE

## 2014-11-08 LAB — URINE MICROSCOPIC-ADD ON

## 2014-11-08 LAB — LIPASE, BLOOD: LIPASE: 36 U/L (ref 22–51)

## 2014-11-08 MED ORDER — ONDANSETRON HCL 4 MG/2ML IJ SOLN
4.0000 mg | Freq: Once | INTRAMUSCULAR | Status: AC
  Administered 2014-11-08: 4 mg via INTRAVENOUS
  Filled 2014-11-08: qty 2

## 2014-11-08 MED ORDER — SODIUM CHLORIDE 0.9 % IV BOLUS (SEPSIS)
1000.0000 mL | Freq: Once | INTRAVENOUS | Status: AC
  Administered 2014-11-08: 1000 mL via INTRAVENOUS

## 2014-11-08 MED ORDER — FENTANYL CITRATE (PF) 100 MCG/2ML IJ SOLN: 50.0000 ug | Freq: Once | INTRAMUSCULAR | Status: DC

## 2014-11-08 MED ORDER — ONDANSETRON 4 MG PO TBDP: ORAL_TABLET | ORAL | Status: DC

## 2014-11-08 NOTE — ED Notes (Signed)
Pt had no vomiting with PO fluid challenge.

## 2014-11-08 NOTE — ED Notes (Signed)
Pt unable to void at this time. 

## 2014-11-08 NOTE — ED Provider Notes (Signed)
CSN: [id number]     Arrival date & time 11/08/14  1450 History  This chart was scribed for Rolan BuccoMelanie Shadai Mcclane, MD by Richarda Overlieichard Holland, ED Scribe. This patient was seen in room MH12/MH12 and the patient's care was started 3:34 PM.    Chief Complaint  Patient presents with  . Abdominal Pain   The history is provided by the patient. No language interpreter was used.   HPI Comments: Christie [name] is a 29 y.o. female with a history of asthma, anemia, DM and UTI who presents to the Emergency Department complaining of generalized abdominal pain for the last 2 hours. Pt describes the pain as sharp and "gripping." She reports associated chills, subjective fever, sweating, nausea and states that she has not eaten today. Pt reports her LNMP was 4/8-4/16 and says that her current pain is different than her typical menstrual cramps. She reports no known sick contacts. She reports no modifying factors at this time. Pt denies vomiting, cough or any urinary symptoms.  No vaginal bleeding or discharge.  Past Medical History  Diagnosis Date  . Asthma   . Hx of migraines   . Anemia   . Depression   . Fx ankle     history of left ankle fx at age 29  . Pregnancy induced hypertension   . Diabetes mellitus     type 1 on insulin  . Headache(784.0)   . Urinary tract infection   . Chlamydia   . Abnormal Pap smear     colpo scheduled  . Anxiety    Past Surgical History  Procedure Laterality Date  . Wisdom tooth extraction    . Colposcopy     Family History  Problem Relation Age of Onset  . Asthma Mother   . Diabetes Mother   . Hypertension Mother   . Asthma Father   . Anesthesia problems Neg Hx   . Hypertension Sister   . Diabetes Maternal Grandmother   . Cancer Maternal Grandmother     breast  . Obesity Other   . Sleep apnea Other    History  Substance Use Topics  . Smoking status: Never Smoker   . Smokeless tobacco: Never Used  . Alcohol Use: Yes     Comment: social drinker   OB History     Gravida Para Term Preterm AB TAB SAB Ectopic Multiple Living   3 2 2  0 1 1 0 0 0 2     Review of Systems  Constitutional: Positive for fever, chills, diaphoresis and appetite change. Negative for fatigue.  HENT: Negative for congestion, rhinorrhea and sneezing.   Eyes: Negative.   Respiratory: Negative for cough, chest tightness and shortness of breath.   Cardiovascular: Negative for chest pain and leg swelling.  Gastrointestinal: Positive for nausea and abdominal pain. Negative for vomiting, diarrhea and blood in stool.  Genitourinary: Negative for dysuria, frequency, hematuria, flank pain, vaginal bleeding, vaginal discharge, difficulty urinating and pelvic pain.  Musculoskeletal: Negative for back pain and arthralgias.  Skin: Negative for rash.  Neurological: Negative for dizziness, speech difficulty, weakness, numbness and headaches.  All other systems reviewed and are negative.  Allergies  Banana; Citrus; and Adhesive  Home Medications   Prior to Admission medications   Medication Sig Start Date End Date Taking? Authorizing Provider  glucagon (GLUCAGON EMERGENCY) 1 MG injection Inject 1 mg into the muscle once as needed. 02/04/14   Carlus Pavlovristina Gherghe, MD  insulin aspart (NOVOLOG) 100 UNIT/ML injection Inject 15-25 Units into the skin  3 (three) times daily with meals. 02/04/14   Carlus Pavlov, MD  insulin glargine (LANTUS) 100 UNIT/ML injection Inject 0.35 mLs (35 Units total) into the skin daily with breakfast. 02/04/14   Carlus Pavlov, MD  Insulin Pen Needle 31G X 8 MM MISC 1 Device by Does not apply route. 07/06/13   Historical Provider, MD  ondansetron (ZOFRAN ODT) 4 MG disintegrating tablet  ODT q4 hours prn nausea/vomit 11/08/14   Rolan Bucco, MD   BP 112/68 mmHg  Pulse 77  Temp(Src) 99 F (37.2 C) (Oral)  Resp 18  Ht  (1.753 m)  Wt 197 lb (89.359 kg)  BMI 29.08 kg/m2  SpO2 100%  LMP 10/15/2014 Physical Exam  Constitutional: She is oriented to person,  place, and time. She appears well-developed and well-nourished.  HENT:  Head: Normocephalic and atraumatic.  Eyes: Pupils are equal, round, and reactive to light. Right eye exhibits no discharge. Left eye exhibits no discharge.  Neck: Normal range of motion. Neck supple. No tracheal deviation present.  Cardiovascular: Normal rate, regular rhythm and normal heart sounds.   Pulmonary/Chest: Effort normal and breath sounds normal. No respiratory distress. She has no wheezes. She has no rales. She exhibits no tenderness.  Abdominal: Soft. Bowel sounds are normal. She exhibits no distension. There is tenderness. There is no rebound and no guarding.  Mild diffuse tenderness on palpation of the abdomen  Musculoskeletal: Normal range of motion. She exhibits no edema.  Lymphadenopathy:    She has no cervical adenopathy.  Neurological: She is alert and oriented to person, place, and time.  Skin: Skin is warm and dry. No rash noted.  Psychiatric: She has a normal mood and affect. Her behavior is normal.  Nursing note and vitals reviewed.   ED Course  Procedures   DIAGNOSTIC STUDIES: Oxygen Saturation is 100% on RA, normal by my interpretation.    COORDINATION OF CARE: 3:38 PM Discussed treatment plan with pt at bedside and pt agreed to plan.   Labs Review Results for orders placed or performed during the hospital encounter of 11/08/14  Urinalysis, Routine w reflex microscopic  Result Value Ref Range   Color, Urine YELLOW YELLOW   APPearance CLEAR CLEAR   Specific Gravity, Urine >1.046 (H) 1.005 - 1.030   pH 5.5 5.0 - 8.0   Glucose, UA >1000 (A) NEGATIVE mg/dL   Hgb urine dipstick NEGATIVE NEGATIVE   Bilirubin Urine NEGATIVE NEGATIVE   Ketones, ur NEGATIVE NEGATIVE mg/dL   Protein, ur NEGATIVE NEGATIVE mg/dL   Urobilinogen, UA 0.2 0.0 - 1.0 mg/dL   Nitrite NEGATIVE NEGATIVE   Leukocytes, UA NEGATIVE NEGATIVE  Pregnancy, urine  Result Value Ref Range   Preg Test, Ur NEGATIVE NEGATIVE   Comprehensive metabolic panel  Result Value Ref Range   Sodium 133 (L) 135 - 145 mmol/L   Potassium 3.7 3.5 - 5.1 mmol/L   Chloride 102 101 - 111 mmol/L   CO2 24 22 - 32 mmol/L   Glucose, Bld 305 (H) 70 - 99 mg/dL   BUN 10 6 - 20 mg/dL   Creatinine, Ser 0.86 0.44 - 1.00 mg/dL   Calcium 9.2 8.9 - 57.8 mg/dL   Total Protein 7.4 6.5 - 8.1 g/dL   Albumin 3.7 3.5 - 5.0 g/dL   AST 22 15 - 41 U/L   ALT 14 14 - 54 U/L   Alkaline Phosphatase 56 38 - 126 U/L   Total Bilirubin 0.3 0.3 - 1.2 mg/dL   GFR calc non  Af Amer >60 >60 mL/min   GFR calc Af Amer >60 >60 mL/min   Anion gap 7 5 - 15  Lipase, blood  Result Value Ref Range   Lipase 36 22 - 51 U/L  CBC with Differential  Result Value Ref Range   WBC 4.3 4.0 - 10.5 K/uL   RBC 4.95 3.87 - 5.11 MIL/uL   Hemoglobin 11.3 (L) 12.0 - 15.0 g/dL   HCT 04.5 (L) 40.9 - 81.1 %   MCV 70.7 (L) 78.0 - 100.0 fL   MCH 22.8 (L) 26.0 - 34.0 pg   MCHC 32.3 30.0 - 36.0 g/dL   RDW 91.4 (H) 78.2 - 95.6 %   Platelets 200 150 - 400 K/uL   Neutrophils Relative % 45 43 - 77 %   Neutro Abs 2.0 1.7 - 7.7 K/uL   Lymphocytes Relative 46 12 - 46 %   Lymphs Abs 2.0 0.7 - 4.0 K/uL   Monocytes Relative 8 3 - 12 %   Monocytes Absolute 0.4 0.1 - 1.0 K/uL   Eosinophils Relative 1 0 - 5 %   Eosinophils Absolute 0.0 0.0 - 0.7 K/uL   Basophils Relative 0 0 - 1 %   Basophils Absolute 0.0 0.0 - 0.1 K/uL  Urine microscopic-add on  Result Value Ref Range   Squamous Epithelial / LPF FEW (A) RARE   Bacteria, UA FEW (A) RARE   No results found.    Imaging Review No results found.   EKG Interpretation None      MDM   Final diagnoses:  Generalized abdominal pain   PT was given IVFs and zofran.  Feeling much better.  No abd pain.  No pain on palpation of abdomen.  Tolerating PO fluids.  Likely viral.  No evidence of obstruction.  No pain over gallbladder/appendix.  Pt discharged in good condition. She was advised to return to the ED if she has worsening pain  vomiting or fevers. She was given a prescription for Zofran to use at home as needed for nausea. She was also advised that her blood sugar was high and she is to keep an eye on this at home.  I personally performed the services described in this documentation, which was scribed in my presence.  The recorded information has been reviewed and considered.      Rolan Bucco, MD 11/08/14 [phone]

## 2014-11-08 NOTE — ED Notes (Signed)
abd pain nausea x 1.5 hrs

## 2014-11-08 NOTE — Discharge Instructions (Signed)

## 2014-11-12 ENCOUNTER — Encounter (HOSPITAL_COMMUNITY): Payer: Self-pay

## 2014-11-12 ENCOUNTER — Inpatient Hospital Stay (HOSPITAL_COMMUNITY)
Admission: AD | Admit: 2014-11-12 | Discharge: 2014-11-12 | Disposition: A | Discharge status: Home / Self Care | Payer: Medicaid Other | Source: Ambulatory Visit | Attending: Obstetrics & Gynecology | Admitting: Obstetrics & Gynecology

## 2014-11-12 DIAGNOSIS — N898 Other specified noninflammatory disorders of vagina: Secondary | ICD-10-CM | POA: Insufficient documentation

## 2014-11-12 DIAGNOSIS — N3001 Acute cystitis with hematuria: Secondary | ICD-10-CM | POA: Diagnosis not present

## 2014-11-12 DIAGNOSIS — N390 Urinary tract infection, site not specified: Secondary | ICD-10-CM | POA: Insufficient documentation

## 2014-11-12 DIAGNOSIS — R109 Unspecified abdominal pain: Secondary | ICD-10-CM | POA: Insufficient documentation

## 2014-11-12 LAB — URINE MICROSCOPIC-ADD ON

## 2014-11-12 LAB — URINALYSIS, ROUTINE W REFLEX MICROSCOPIC
BILIRUBIN URINE: NEGATIVE
Glucose, UA: >1000 mg/dL — AB
Ketones, ur: 15 mg/dL — AB
LEUKOCYTES UA: NEGATIVE
NITRITE: NEGATIVE
Protein, ur: NEGATIVE mg/dL
SPECIFIC GRAVITY, URINE: 1.02 (ref 1.005–1.030)
UROBILINOGEN UA: 0.2 mg/dL (ref 0.0–1.0)
pH: 6 (ref 5.0–8.0)

## 2014-11-12 LAB — POCT PREGNANCY, URINE: PREG TEST UR: NEGATIVE

## 2014-11-12 LAB — WET PREP, GENITAL
Clue Cells Wet Prep HPF POC: NONE SEEN
Trich, Wet Prep: NONE SEEN
WBC, Wet Prep HPF POC: NONE SEEN
Yeast Wet Prep HPF POC: NONE SEEN

## 2014-11-12 MED ORDER — FLUCONAZOLE 100 MG PO TABS: 100.0000 mg | ORAL_TABLET | Freq: Every day | ORAL | Status: DC

## 2014-11-12 MED ORDER — NITROFURANTOIN MONOHYD MACRO 100 MG PO CAPS: 100.0000 mg | ORAL_CAPSULE | Freq: Two times a day (BID) | ORAL | Status: AC

## 2014-11-12 NOTE — MAU Note (Signed)
Pt presents complaining of generalized abdominal pain with cramping along the bottom that happens mostly when she has stress. Denies diarrhea or constipation. Denies dysuria. States she has a yeast infection. Denies vaginal discharge or bleeding. States she gets yeast infections regularly and knows when she has them. Hx of diabetes but forgets to take insulin.

## 2014-11-12 NOTE — Discharge Instructions (Signed)
Urinary Tract Infection A urinary tract infection (UTI) can occur any place along the urinary tract. The tract includes the kidneys, ureters, bladder, and urethra. A type of germ called bacteria often causes a UTI. UTIs are often helped with antibiotic medicine.  HOME CARE   If given, take antibiotics as told by your doctor. Finish them even if you start to feel better.  Drink enough fluids to keep your pee (urine) clear or pale yellow.  Avoid tea, drinks with caffeine, and bubbly (carbonated) drinks.  Pee often. Avoid holding your pee in for a long time.  Pee before and after having sex (intercourse).  Wipe from front to back after you poop (bowel movement) if you are a woman. Use each tissue only once. GET HELP RIGHT AWAY IF:   You have back pain.  You have lower belly (abdominal) pain.  You have chills.  You feel sick to your stomach (nauseous).  You throw up (vomit).  Your burning or discomfort with peeing does not go away.  You have a fever.  Your symptoms are not better in 3 days. MAKE SURE YOU:   Understand these instructions.  Will watch your condition.  Will get help right away if you are not doing well or get worse. Document Released: 12/12/2007 Document Revised: 03/19/2012 Document Reviewed: 01/24/2012 [facility] Patient Information 2015 [place], [place]. This information is not intended to replace advice given to you by your health care provider. Make sure you discuss any questions you have with your health care provider. Candida Infection A Candida infection (also called yeast, fungus, and Monilia infection) is an overgrowth of yeast that can occur anywhere on the body. A yeast infection commonly occurs in warm, moist body areas. Usually, the infection remains localized but can spread to become a systemic infection. A yeast infection may be a sign of a more severe disease such as diabetes, leukemia, or AIDS. A yeast infection can occur in both men and women. In  women, Candida vaginitis is a vaginal infection. It is one of the most common causes of vaginitis. Men usually do not have symptoms or know they have an infection until other problems develop. Men may find out they have a yeast infection because their sex partner has a yeast infection. Uncircumcised men are more likely to get a yeast infection than circumcised men. This is because the uncircumcised glans is not exposed to air and does not remain as dry as that of a circumcised glans. Older adults may develop yeast infections around dentures. CAUSES  Women  Antibiotics.  Steroid medication taken for a long time.  Being overweight (obese).  Diabetes.  Poor immune condition.  Certain serious medical conditions.  Immune suppressive medications for organ transplant patients.  Chemotherapy.  Pregnancy.  Menstruation.  Stress and fatigue.  Intravenous drug use.  Oral contraceptives.  Wearing tight-fitting clothes in the crotch area.  Catching it from a sex partner who has a yeast infection.  Spermicide.  Intravenous, urinary, or other catheters. Men  Catching it from a sex partner who has a yeast infection.  Having oral or anal sex with a person who has the infection.  Spermicide.  Diabetes.  Antibiotics.  Poor immune system.  Medications that suppress the immune system.  Intravenous drug use.  Intravenous, urinary, or other catheters. SYMPTOMS  Women  Thick, white vaginal discharge.  Vaginal itching.  Redness and swelling in and around the vagina.  Irritation of the lips of the vagina and perineum.  Blisters on the  vaginal lips and perineum.  Painful sexual intercourse.  Low blood sugar (hypoglycemia).  Painful urination.  Bladder infections.  Intestinal problems such as constipation, indigestion, bad breath, bloating, increase in gas, diarrhea, or loose stools. Men  Men may develop intestinal problems such as constipation, indigestion, bad  breath, bloating, increase in gas, diarrhea, or loose stools.  Dry, cracked skin on the penis with itching or discomfort.  Jock itch.  Dry, flaky skin.  Athlete's foot.  Hypoglycemia. DIAGNOSIS  Women  A history and an exam are performed.  The discharge may be examined under a microscope.  A culture may be taken of the discharge. Men  A history and an exam are performed.  Any discharge from the penis or areas of cracked skin will be looked at under the microscope and cultured.  Stool samples may be cultured. TREATMENT  Women  Vaginal antifungal suppositories and creams.  Medicated creams to decrease irritation and itching on the outside of the vagina.  Warm compresses to the perineal area to decrease swelling and discomfort.  Oral antifungal medications.  Medicated vaginal suppositories or cream for repeated or recurrent infections.  Wash and dry the irritation areas before applying the cream.  Eating yogurt with Lactobacillus may help with prevention and treatment.  Sometimes painting the vagina with gentian violet solution may help if creams and suppositories do not work. Men  Antifungal creams and oral antifungal medications.  Sometimes treatment must continue for 30 days after the symptoms go away to prevent recurrence. HOME CARE INSTRUCTIONS  Women  Use cotton underwear and avoid tight-fitting clothing.  Avoid colored, scented toilet paper and deodorant tampons or pads.  Do not douche.  Keep your diabetes under control.  Finish all the prescribed medications.  Keep your skin clean and dry.  Consume milk or yogurt with Lactobacillus-active culture regularly. If you get frequent yeast infections and think that is what the infection is, there are over-the-counter medications that you can get. If the infection does not show healing in 3 days, talk to your caregiver.  Tell your sex partner you have a yeast infection. Your partner may need treatment  also, especially if your infection does not clear up or recurs. Men  Keep your skin clean and dry.  Keep your diabetes under control.  Finish all prescribed medications.  Tell your sex partner that you have a yeast infection so he or she can be treated if necessary. SEEK MEDICAL CARE IF:   Your symptoms do not clear up or worsen in one week after treatment.  You have an oral temperature above 102 F (38.9 C).  You have trouble swallowing or eating for a prolonged time.  You develop blisters on and around your vagina.  You develop vaginal bleeding and it is not your menstrual period.  You develop abdominal pain.  You develop intestinal problems as mentioned above.  You get weak or light-headed.  You have painful or increased urination.  You have pain during sexual intercourse. MAKE SURE YOU:   Understand these instructions.  Will watch your condition.  Will get help right away if you are not doing well or get worse. Document Released: 08/02/2004 Document Revised: 11/09/2013 Document Reviewed: 11/14/2009 [facility] Patient Information 2015 [place], [place]. This information is not intended to replace advice given to you by your health care provider. Make sure you discuss any questions you have with your health care provider.

## 2014-11-12 NOTE — MAU Note (Signed)
Lab called regarding cultures being collected but not in process. Lab tech will run next

## 2014-11-12 NOTE — MAU Provider Note (Signed)
History     CSN: [id number]  Arrival date and time: 11/12/14 [id number]   First Provider Initiated Contact with Patient 11/12/14 1015      Chief Complaint  Patient presents with  . Abdominal Pain  . Vaginal Discharge   Abdominal Pain This is a new problem. The current episode started 1 to 4 weeks ago. The onset quality is undetermined. The problem occurs constantly. The problem has been unchanged. The quality of the pain is cramping. The abdominal pain radiates to the LLQ and RLQ. Nothing aggravates the pain. The pain is relieved by nothing. She has tried nothing for the symptoms. Type 1 Diabetes  Vaginal Discharge The patient's primary symptoms include vaginal discharge. Associated symptoms include abdominal pain. (Type 1 Diabetes)   29 y.o. [id number] presents to MAU with c/o of a yeast infection and abdominal cramping x 2 weeks.  Past Medical History  Diagnosis Date  . Asthma   . Hx of migraines   . Anemia   . Depression   . Fx ankle     history of left ankle fx at age 29  . Pregnancy induced hypertension   . Diabetes mellitus     type 1 on insulin  . Headache(784.0)   . Urinary tract infection   . Chlamydia   . Abnormal Pap smear     colpo scheduled  . Anxiety     Past Surgical History  Procedure Laterality Date  . Wisdom tooth extraction    . Colposcopy      Family History  Problem Relation Age of Onset  . Asthma Mother   . Diabetes Mother   . Hypertension Mother   . Asthma Father   . Anesthesia problems Neg Hx   . Hypertension Sister   . Diabetes Maternal Grandmother   . Cancer Maternal Grandmother     breast  . Obesity Other   . Sleep apnea Other     History  Substance Use Topics  . Smoking status: Never Smoker   . Smokeless tobacco: Never Used  . Alcohol Use: Yes     Comment: social drinker    Allergies:  Allergies  Allergen Reactions  . Banana Anaphylaxis  . Citrus Other (See Comments)    Gums bleed  . Adhesive [Tape] Rash    Prescriptions  prior to admission  Medication Sig Dispense Refill Last Dose  . glucagon (GLUCAGON EMERGENCY) 1 MG injection Inject 1 mg into the muscle once as needed. 1 each 12 Taking  . insulin aspart (NOVOLOG) 100 UNIT/ML injection Inject 15-25 Units into the skin 3 (three) times daily with meals. 10 mL 11 Taking  . insulin glargine (LANTUS) 100 UNIT/ML injection Inject 0.35 mLs (35 Units total) into the skin daily with breakfast. 10 mL 3 Taking  . Insulin Pen Needle 31G X 8 MM MISC 1 Device by Does not apply route.   Taking  . ondansetron (ZOFRAN ODT) 4 MG disintegrating tablet 4mg  ODT q4 hours prn nausea/vomit 4 tablet 0     Review of Systems  Gastrointestinal: Positive for abdominal pain.  Genitourinary: Positive for vaginal discharge.       White discharge  All other systems reviewed and are negative.  Physical Exam   Blood pressure 117/82, pulse 109, temperature 98.1 F (36.7 C), resp. rate 18, last menstrual period 10/15/2014.  Physical Exam  Nursing note and vitals reviewed. Constitutional: She is oriented to person, place, and time. She appears well-developed and well-nourished. No distress.  HENT:  Head: Normocephalic  and atraumatic.  Neck: Normal range of motion.  Cardiovascular: Normal rate and regular rhythm.   Respiratory: She is in respiratory distress.  GI: Soft.  Genitourinary: Cervix exhibits discharge. Cervix exhibits no motion tenderness and no friability. Right adnexum displays no mass, no tenderness and no fullness. Left adnexum displays no mass, no tenderness and no fullness.  nuvaring in place  Musculoskeletal: Normal range of motion.  Neurological: She is alert and oriented to person, place, and time.  Skin: Skin is warm and dry.  Psychiatric: She has a normal mood and affect. Her behavior is normal. Judgment and thought content normal.   Results for orders placed or performed during the hospital encounter of 11/12/14 (from the past 24 hour(s))  Urinalysis, Routine w  reflex microscopic     Status: Abnormal   Collection Time: 11/12/14 10:02 AM  Result Value Ref Range   Color, Urine YELLOW YELLOW   APPearance CLEAR CLEAR   Specific Gravity, Urine 1.020 1.005 - 1.030   pH 6.0 5.0 - 8.0   Glucose, UA >1000 (A) NEGATIVE mg/dL   Hgb urine dipstick SMALL (A) NEGATIVE   Bilirubin Urine NEGATIVE NEGATIVE   Ketones, ur 15 (A) NEGATIVE mg/dL   Protein, ur NEGATIVE NEGATIVE mg/dL   Urobilinogen, UA 0.2 0.0 - 1.0 mg/dL   Nitrite NEGATIVE NEGATIVE   Leukocytes, UA NEGATIVE NEGATIVE  Urine microscopic-add on     Status: Abnormal   Collection Time: 11/12/14 10:02 AM  Result Value Ref Range   Squamous Epithelial / LPF MANY (A) RARE   RBC / HPF 3-6 <3 RBC/hpf   Bacteria, UA FEW (A) RARE  Pregnancy, urine POC     Status: None   Collection Time: 11/12/14 10:17 AM  Result Value Ref Range   Preg Test, Ur NEGATIVE NEGATIVE  Wet prep, genital     Status: None   Collection Time: 11/12/14 10:20 AM  Result Value Ref Range   Yeast Wet Prep HPF POC NONE SEEN NONE SEEN   Trich, Wet Prep NONE SEEN NONE SEEN   Clue Cells Wet Prep HPF POC NONE SEEN NONE SEEN   WBC, Wet Prep HPF POC NONE SEEN NONE SEEN    MAU Course  Procedures  MDM Wet prep ua Gc/ch cx  Assessment and Plan  UTI Possible Yeast  Send urine for CX Diflucan Macrobid Discharge to home  MedtronicClemmons,Lori Grissett 11/12/2014, 10:23 AM

## 2014-11-15 LAB — GC/CHLAMYDIA PROBE AMP (~~LOC~~) NOT AT ARMC
Chlamydia: NEGATIVE
Neisseria Gonorrhea: NEGATIVE

## 2014-11-17 ENCOUNTER — Encounter: Payer: Medicaid Other | Admitting: Family Medicine

## 2014-11-18 NOTE — Progress Notes (Signed)
This encounter was created in error - please disregard.

## 2014-12-02 ENCOUNTER — Ambulatory Visit (INDEPENDENT_AMBULATORY_CARE_PROVIDER_SITE_OTHER): Payer: Medicaid Other | Admitting: Family Medicine

## 2014-12-02 ENCOUNTER — Encounter: Payer: Self-pay | Admitting: Family Medicine

## 2014-12-02 VITALS — BP 112/81 | HR 72 | Ht 69.0 in | Wt 197.6 lb

## 2014-12-02 DIAGNOSIS — N762 Acute vulvitis: Secondary | ICD-10-CM | POA: Diagnosis present

## 2014-12-02 MED ORDER — MICONAZOLE NITRATE 2 % EX CREA: 1.0000 "application " | TOPICAL_CREAM | Freq: Two times a day (BID) | CUTANEOUS | Status: DC

## 2014-12-02 NOTE — Progress Notes (Signed)
   Subjective:    Patient ID: Christie [name], female    DOB: 22-May-1986, 29 y.o.   MRN: [id number]  HPI Patient presents to the office with complaints of external genital itching that started approximately 5 weeks ago and has become worse over the past couple of weeks. She was seen in the [facility] and prescribed Diflucan, although this was never sent to the pharmacy. She presents for evaluation. No provoking or pain in factors. Has not spread.   Review of Systems  Constitutional: Negative for chills and fatigue.  Gastrointestinal: Negative for nausea, vomiting, abdominal pain and diarrhea.  Genitourinary: Negative for dysuria, vaginal bleeding, vaginal discharge, vaginal pain and dyspareunia.   I have reviewed the patients past medical history.  I have reviewed the patient's medication list and allergies.  Never smoked.     Objective:   Physical Exam  Constitutional: She appears well-developed and well-nourished.  Abdominal: Soft. Bowel sounds are normal. She exhibits no distension and no mass. There is no tenderness. There is no rebound and no guarding.  Genitourinary: There is rash (erythema between labia minora and labia majora) on the right labia. There is no tenderness, lesion or injury on the right labia. There is rash on the left labia. There is no tenderness, lesion or injury on the left labia. No erythema, tenderness or bleeding in the vagina. No foreign body around the vagina. No signs of injury around the vagina. No vaginal discharge found.  Skin: Skin is warm and dry. No rash noted. No erythema. No pallor.  Psychiatric: She has a normal mood and affect. Her behavior is normal. Judgment and thought content normal.      Assessment & Plan:   Problem List Items Addressed This Visit    None    Visit Diagnoses    Vulvitis    -  Primary      Miconazole twice a day 7 days. If not improved, patient is to call. Would recommend Diflucan at that time.

## 2014-12-07 ENCOUNTER — Telehealth: Payer: Self-pay | Admitting: *Deleted

## 2014-12-07 DIAGNOSIS — B379 Candidiasis, unspecified: Secondary | ICD-10-CM

## 2014-12-07 MED ORDER — FLUCONAZOLE 150 MG PO TABS: 150.0000 mg | ORAL_TABLET | Freq: Once | ORAL | Status: DC

## 2014-12-07 NOTE — Telephone Encounter (Signed)
Pt called and stated that the cream Dr. Adrian BlackwaterStinson gave her is not working for her yeast infection. Per Dr. Adrian BlackwaterStinson note okay to send in Diflucan. Rx to pharmacy.

## 2015-01-03 ENCOUNTER — Other Ambulatory Visit: Payer: Self-pay

## 2015-01-12 ENCOUNTER — Telehealth: Payer: Self-pay | Admitting: *Deleted

## 2015-01-12 DIAGNOSIS — B379 Candidiasis, unspecified: Secondary | ICD-10-CM

## 2015-01-12 MED ORDER — FLUCONAZOLE 150 MG PO TABS: 150.0000 mg | ORAL_TABLET | Freq: Once | ORAL | Status: DC

## 2015-01-12 NOTE — Telephone Encounter (Signed)
Patient reports itching and vaginal discharge. Rx for Diflucan called to pharmacy per protocol.

## 2015-05-04 ENCOUNTER — Encounter: Payer: Self-pay | Admitting: Obstetrics and Gynecology

## 2015-05-04 ENCOUNTER — Ambulatory Visit (INDEPENDENT_AMBULATORY_CARE_PROVIDER_SITE_OTHER): Payer: Medicaid Other | Admitting: *Deleted

## 2015-05-04 DIAGNOSIS — R300 Dysuria: Secondary | ICD-10-CM | POA: Diagnosis present

## 2015-05-04 LAB — POCT URINALYSIS DIP (DEVICE)
Bilirubin Urine: NEGATIVE
Hgb urine dipstick: NEGATIVE
Ketones, ur: NEGATIVE mg/dL
Leukocytes, UA: NEGATIVE
Nitrite: NEGATIVE
Protein, ur: NEGATIVE mg/dL
SPECIFIC GRAVITY, URINE: 1.01 (ref 1.005–1.030)
Urobilinogen, UA: 0.2 mg/dL (ref 0.0–1.0)
pH: 7 (ref 5.0–8.0)

## 2015-05-04 NOTE — Progress Notes (Signed)
Here to have a urine sample checked for UTI.  Patient left after collecting specimen. Urinalysis ran- negative except for glucose.  Called [facility] and informed her urine was negative for uti.  We discussed her symptoms which are pelvic pain - and pressure when urinating.  She elected to make an appointment for pelvic pain .  Transferred call to front desk.

## 2015-05-17 ENCOUNTER — Inpatient Hospital Stay (HOSPITAL_COMMUNITY)
Admission: AD | Admit: 2015-05-17 | Discharge: 2015-05-17 | Disposition: A | Discharge status: Home / Self Care | Payer: Medicaid Other | Source: Ambulatory Visit | Attending: Obstetrics and Gynecology | Admitting: Obstetrics and Gynecology

## 2015-05-17 ENCOUNTER — Encounter (HOSPITAL_COMMUNITY): Payer: Self-pay

## 2015-05-17 DIAGNOSIS — B373 Candidiasis of vulva and vagina: Secondary | ICD-10-CM | POA: Diagnosis not present

## 2015-05-17 DIAGNOSIS — E1065 Type 1 diabetes mellitus with hyperglycemia: Secondary | ICD-10-CM | POA: Diagnosis not present

## 2015-05-17 DIAGNOSIS — B3731 Acute candidiasis of vulva and vagina: Secondary | ICD-10-CM

## 2015-05-17 DIAGNOSIS — N898 Other specified noninflammatory disorders of vagina: Secondary | ICD-10-CM | POA: Diagnosis present

## 2015-05-17 LAB — URINALYSIS, ROUTINE W REFLEX MICROSCOPIC
Bilirubin Urine: NEGATIVE
Glucose, UA: >1000 mg/dL — AB
Hgb urine dipstick: NEGATIVE
Ketones, ur: NEGATIVE mg/dL
LEUKOCYTES UA: NEGATIVE
Nitrite: NEGATIVE
PROTEIN: NEGATIVE mg/dL
Specific Gravity, Urine: 1.02 (ref 1.005–1.030)
UROBILINOGEN UA: 0.2 mg/dL (ref 0.0–1.0)
pH: 5 (ref 5.0–8.0)

## 2015-05-17 LAB — URINE MICROSCOPIC-ADD ON

## 2015-05-17 LAB — GLUCOSE, CAPILLARY: Glucose-Capillary: 471 mg/dL — ABNORMAL HIGH (ref 65–99)

## 2015-05-17 LAB — WET PREP, GENITAL
Clue Cells Wet Prep HPF POC: NONE SEEN
TRICH WET PREP: NONE SEEN
YEAST WET PREP: NONE SEEN

## 2015-05-17 LAB — POCT PREGNANCY, URINE: PREG TEST UR: NEGATIVE

## 2015-05-17 MED ORDER — FLUCONAZOLE 150 MG PO TABS: 150.0000 mg | ORAL_TABLET | ORAL | Status: AC

## 2015-05-17 MED ORDER — INSULIN ASPART 100 UNIT/ML ~~LOC~~ SOLN: 15.0000 [IU] | Freq: Three times a day (TID) | SUBCUTANEOUS | Status: DC

## 2015-05-17 MED ORDER — INSULIN PEN NEEDLE 31G X 8 MM MISC: Status: DC

## 2015-05-17 MED ORDER — INSULIN GLARGINE 100 UNIT/ML ~~LOC~~ SOLN: 35.0000 [IU] | Freq: Every day | SUBCUTANEOUS | Status: DC

## 2015-05-17 NOTE — MAU Note (Addendum)
Pt here with c/o yeast infection symptoms starting 3 days ago. Pt states she gets them frequently and can tell when they are happening. Pt c/o intense itching with no discharge.

## 2015-05-17 NOTE — MAU Provider Note (Signed)
History     CSN: [id number]  Arrival date and time: 05/17/15 1018   First Provider Initiated Contact with Patient 05/17/15 1112       Chief Complaint  Patient presents with  . Vaginal Discharge   Christie [name] is a 29 y.o. female who presents for vaginal irritation.  Reports recurrent yeast infections.  Pt is uncontrolled type 1 diabetic. Pt states she is in between PCPs right now and is currently out of her insulin as of 2 days ago.   Vaginal Discharge The patient's primary symptoms include genital itching and a genital rash. The patient's pertinent negatives include no vaginal bleeding or vaginal discharge. This is a recurrent problem. The current episode started yesterday. The problem occurs constantly. The problem has been gradually worsening. The patient is experiencing no pain. She is not pregnant. Associated symptoms include frequency (pt states ongoing issue r/t her diabetes). Pertinent negatives include no abdominal pain, nausea, painful intercourse or vomiting. There has been no bleeding. Nothing aggravates the symptoms. She has tried nothing for the symptoms. She is sexually active. No, her partner does not have an STD. Contraceptive use: nuvaring. Her menstrual history has been regular. (Type 1 diabetes)    OB History    Gravida Para Term Preterm AB TAB SAB Ectopic Multiple Living   3 2 2  0 1 1 0 0 0 2      Past Medical History  Diagnosis Date  . Asthma   . Hx of migraines   . Anemia   . Depression   . Fx ankle     history of left ankle fx at age 29  . Pregnancy induced hypertension   . Diabetes mellitus     type 1 on insulin  . Headache(784.0)   . Urinary tract infection   . Chlamydia   . Abnormal Pap smear     colpo scheduled  . Anxiety     Past Surgical History  Procedure Laterality Date  . Wisdom tooth extraction    . Colposcopy      Family History  Problem Relation Age of Onset  . Asthma Mother   . Diabetes Mother   . Hypertension Mother    . Asthma Father   . Anesthesia problems Neg Hx   . Hypertension Sister   . Diabetes Maternal Grandmother   . Cancer Maternal Grandmother     breast  . Obesity Other   . Sleep apnea Other     Social History  Substance Use Topics  . Smoking status: Never Smoker   . Smokeless tobacco: Never Used  . Alcohol Use: Yes     Comment: social drinker    Allergies:  Allergies  Allergen Reactions  . Banana Anaphylaxis  . Citrus Other (See Comments)    Gums bleed  . Adhesive [Tape] Rash    Prescriptions prior to admission  Medication Sig Dispense Refill Last Dose  . insulin aspart (NOVOLOG) 100 UNIT/ML injection Inject 15-25 Units into the skin 3 (three) times daily with meals. 10 mL 11 05/16/2015 at Unknown time  . insulin glargine (LANTUS) 100 UNIT/ML injection Inject 0.35 mLs (35 Units total) into the skin daily with breakfast. 10 mL 3 Past Week at Unknown time  . Insulin Pen Needle 31G X 8 MM MISC 1 Device by Does not apply route.   05/16/2015 at Unknown time  . etonogestrel-ethinyl estradiol (NUVARING) 0.12-0.015 MG/24HR vaginal ring Place 1 each vaginally every 28 (twenty-eight) days. Insert vaginally and leave in place for  3 consecutive weeks, then remove for 1 week.   05/03/2015  . fluconazole (DIFLUCAN) 150 MG tablet Take 1 tablet (150 mg total) by mouth once. (Patient not taking: Reported on 05/17/2015) 2 tablet 0   . glucagon (GLUCAGON EMERGENCY) 1 MG injection Inject 1 mg into the muscle once as needed. 1 each 12 rescue  . miconazole (MICATIN) 2 % cream Apply 1 application topically 2 (two) times daily. (Patient not taking: Reported on 05/17/2015) 28.35 g 0     Review of Systems  Constitutional: Negative.  Negative for weight loss.  Eyes: Negative for blurred vision.  Respiratory: Negative.   Cardiovascular: Negative.   Gastrointestinal: Negative for nausea, vomiting and abdominal pain.  Genitourinary: Positive for frequency (pt states ongoing issue r/t her diabetes).  Negative for vaginal discharge.       + vaginal irritation No vaginal discharge, dyspareunia, or post coital bleeding  Endo/Heme/Allergies: Negative for polydipsia.   Physical Exam   Blood pressure 120/77, pulse 81, temperature 98.3 F (36.8 C), temperature source Oral, resp. rate 18, height  (1.753 m), weight 200 lb (90.719 kg).  Physical Exam  Nursing note and vitals reviewed. Constitutional: She is oriented to person, place, and time. She appears well-developed and well-nourished. No distress.  HENT:  Head: Normocephalic and atraumatic.  Eyes: Conjunctivae are normal. Right eye exhibits no discharge. Left eye exhibits no discharge. No scleral icterus.  Neck: Normal range of motion.  Cardiovascular: Normal rate, regular rhythm and normal heart sounds.   No murmur heard. Respiratory: Effort normal and breath sounds normal. No respiratory distress. She has no wheezes.  GI: Soft. Bowel sounds are normal. She exhibits no distension. There is no tenderness.  Genitourinary: Uterus normal. Cervix exhibits discharge. Cervix exhibits no motion tenderness and no friability. Right adnexum displays no mass, no tenderness and no fullness. Left adnexum displays no mass, no tenderness and no fullness. There is erythema in the vagina.  Erythema bilaterally between labia minora & majora.  Minimal amount of thick white discharge adherent to vaginal walls.   Neurological: She is alert and oriented to person, place, and time.  Skin: Skin is warm and dry. She is not diaphoretic.  Psychiatric: She has a normal mood and affect. Her behavior is normal. Judgment and thought content normal.    MAU Course  Procedures Results for orders placed or performed during the hospital encounter of 05/17/15 (from the past 24 hour(s))  Urinalysis, Routine w reflex microscopic (not at [facility])     Status: Abnormal   Collection Time: 05/17/15 10:45 AM  Result Value Ref Range   Color, Urine YELLOW YELLOW   APPearance  CLEAR CLEAR   Specific Gravity, Urine 1.020 1.005 - 1.030   pH 5.0 5.0 - 8.0   Glucose, UA >1000 (A) NEGATIVE mg/dL   Hgb urine dipstick NEGATIVE NEGATIVE   Bilirubin Urine NEGATIVE NEGATIVE   Ketones, ur NEGATIVE NEGATIVE mg/dL   Protein, ur NEGATIVE NEGATIVE mg/dL   Urobilinogen, UA 0.2 0.0 - 1.0 mg/dL   Nitrite NEGATIVE NEGATIVE   Leukocytes, UA NEGATIVE NEGATIVE  Urine microscopic-add on     Status: Abnormal   Collection Time: 05/17/15 10:45 AM  Result Value Ref Range   Squamous Epithelial / LPF FEW (A) RARE   WBC, UA 0-2 <3 WBC/hpf   RBC / HPF 0-2 <3 RBC/hpf   Bacteria, UA RARE RARE  Pregnancy, urine POC     Status: None   Collection Time: 05/17/15 10:54 AM  Result Value Ref  Range   Preg Test, Ur NEGATIVE NEGATIVE  Glucose, capillary     Status: Abnormal   Collection Time: 05/17/15 10:59 AM  Result Value Ref Range   Glucose-Capillary 471 (H) 65 - 99 mg/dL  Wet prep, genital     Status: Abnormal   Collection Time: 05/17/15 11:25 AM  Result Value Ref Range   Yeast Wet Prep HPF POC NONE SEEN NONE SEEN   Trich, Wet Prep NONE SEEN NONE SEEN   Clue Cells Wet Prep HPF POC NONE SEEN NONE SEEN   WBC, Wet Prep HPF POC FEW (A) NONE SEEN    MDM UPT negative CBG 471 - pt has known history of uncontrolled diabetes, ran out of insulin 2 days ago; last seen in July by PCP. States waiting on Medicaid to change medicaid home - verified that pt's medicaid home is correct prior to patient's discharge.  Urine negative for ketones Will refill insulin - same dosing as per previous prescription in July by PCP Assessment and Plan  A:  1. Vaginal yeast infection   2. Uncontrolled type 1 diabetes mellitus with hyperglycemia (HCC)    P: Discharge home Rx diabetic medications Rx diflucan dosing for recurrent yeast infections Schedule PCP appt ASAP for diabetes management Discussed reasons to return to MAU vs ED  Judeth Horn, NP  05/17/2015, 11:11 AM

## 2015-05-17 NOTE — Discharge Instructions (Signed)
Take 1 diflucan pill every 3 days for 3 pills; then take 1 diflucan pill per week for 6 months.  Call your primary care as soon as possible to make appointment for diabetes management!!      Monilial Vaginitis Vaginitis in a soreness, swelling and redness (inflammation) of the vagina and vulva. Monilial vaginitis is not a sexually transmitted infection. CAUSES  Yeast vaginitis is caused by yeast (candida) that is normally found in your vagina. With a yeast infection, the candida has overgrown in number to a point that upsets the chemical balance. SYMPTOMS   White, thick vaginal discharge.  Swelling, itching, redness and irritation of the vagina and possibly the lips of the vagina (vulva).  Burning or painful urination.  Painful intercourse. DIAGNOSIS  Things that may contribute to monilial vaginitis are:  Postmenopausal and virginal states.  Pregnancy.  Infections.  Being tired, sick or stressed, especially if you had monilial vaginitis in the past.  Diabetes. Good control will help lower the chance.  Birth control pills.  Tight fitting garments.  Using bubble bath, feminine sprays, douches or deodorant tampons.  Taking certain medications that kill germs (antibiotics).  Sporadic recurrence can occur if you become ill. TREATMENT  Your caregiver will give you medication.  There are several kinds of anti monilial vaginal creams and suppositories specific for monilial vaginitis. For recurrent yeast infections, use a suppository or cream in the vagina 2 times a week, or as directed.  Anti-monilial or steroid cream for the itching or irritation of the vulva may also be used. Get your caregiver's permission.  Painting the vagina with methylene blue solution may help if the monilial cream does not work.  Eating yogurt may help prevent monilial vaginitis. HOME CARE INSTRUCTIONS   Finish all medication as prescribed.  Do not have sex until treatment is completed or  after your caregiver tells you it is okay.  Take warm sitz baths.  Do not douche.  Do not use tampons, especially scented ones.  Wear cotton underwear.  Avoid tight pants and panty hose.  Tell your sexual partner that you have a yeast infection. They should go to their caregiver if they have symptoms such as mild rash or itching.  Your sexual partner should be treated as well if your infection is difficult to eliminate.  Practice safer sex. Use condoms.  Some vaginal medications cause latex condoms to fail. Vaginal medications that harm condoms are:  Cleocin cream.  Butoconazole (Femstat).  Terconazole (Terazol) vaginal suppository.  Miconazole (Monistat) (may be purchased over the counter). SEEK MEDICAL CARE IF:   You have a temperature by mouth above 102 F (38.9 C).  The infection is getting worse after 2 days of treatment.  The infection is not getting better after 3 days of treatment.  You develop blisters in or around your vagina.  You develop vaginal bleeding, and it is not your menstrual period.  You have pain when you urinate.  You develop intestinal problems.  You have pain with sexual intercourse.   This information is not intended to replace advice given to you by your health care provider. Make sure you discuss any questions you have with your health care provider.   Document Released: 04/04/2005 Document Revised: 09/17/2011 Document Reviewed: 12/27/2014 Elsevier Interactive Patient Education 2016 Elsevier Inc. Basic Carbohydrate Counting for Diabetes Mellitus Carbohydrate counting is a method for keeping track of the amount of carbohydrates you eat. Eating carbohydrates naturally increases the level of sugar (glucose) in your blood, so  it is important for you to know the amount that is okay for you to have in every meal. Carbohydrate counting helps keep the level of glucose in your blood within normal limits. The amount of carbohydrates allowed is  different for every person. A dietitian can help you calculate the amount that is right for you. Once you know the amount of carbohydrates you can have, you can count the carbohydrates in the foods you want to eat. Carbohydrates are found in the following foods:  Grains, such as breads and cereals.  Dried beans and soy products.  Starchy vegetables, such as potatoes, peas, and corn.  Fruit and fruit juices.  Milk and yogurt.  Sweets and snack foods, such as cake, cookies, candy, chips, soft drinks, and fruit drinks. CARBOHYDRATE COUNTING There are two ways to count the carbohydrates in your food. You can use either of the methods or a combination of both. Reading the "Nutrition Facts" on Packaged Food The "Nutrition Facts" is an area that is included on the labels of almost all packaged food and beverages in the [place]. It includes the serving size of that food or beverage and information about the nutrients in each serving of the food, including the grams (g) of carbohydrate per serving.  Decide the number of servings of this food or beverage that you will be able to eat or drink. Multiply that number of servings by the number of grams of carbohydrate that is listed on the label for that serving. The total will be the amount of carbohydrates you will be having when you eat or drink this food or beverage. Learning Standard Serving Sizes of Food When you eat food that is not packaged or does not include "Nutrition Facts" on the label, you need to measure the servings in order to count the amount of carbohydrates.A serving of most carbohydrate-rich foods contains about 15 g of carbohydrates. The following list includes serving sizes of carbohydrate-rich foods that provide 15 g ofcarbohydrate per serving:   1 slice of bread (1 oz) or 1 six-inch tortilla.    of a hamburger bun or English muffin.  4-6 crackers.   cup unsweetened dry cereal.    cup hot cereal.   cup rice or  pasta.    cup mashed potatoes or  of a large baked potato.  1 cup fresh fruit or one small piece of fruit.    cup canned or frozen fruit or fruit juice.  1 cup milk.   cup plain fat-free yogurt or yogurt sweetened with artificial sweeteners.   cup cooked dried beans or starchy vegetable, such as peas, corn, or potatoes.  Decide the number of standard-size servings that you will eat. Multiply that number of servings by 15 (the grams of carbohydrates in that serving). For example, if you eat 2 cups of strawberries, you will have eaten 2 servings and 30 g of carbohydrates (2 servings x 15 g = 30 g). For foods such as soups and casseroles, in which more than one food is mixed in, you will need to count the carbohydrates in each food that is included. EXAMPLE OF CARBOHYDRATE COUNTING Sample Dinner  3 oz chicken breast.   cup of brown rice.   cup of corn.  1 cup milk.   1 cup strawberries with sugar-free whipped topping.  Carbohydrate Calculation Step 1: Identify the foods that contain carbohydrates:   Rice.   Corn.   Milk.   Strawberries. Step 2:Calculate the number of servings eaten  of each:   2 servings of rice.   1 serving of corn.   1 serving of milk.   1 serving of strawberries. Step 3: Multiply each of those number of servings by 15 g:   2 servings of rice x 15 g = 30 g.   1 serving of corn x 15 g = 15 g.   1 serving of milk x 15 g = 15 g.   1 serving of strawberries x 15 g = 15 g. Step 4: Add together all of the amounts to find the total grams of carbohydrates eaten: 30 g + 15 g + 15 g + 15 g = 75 g.   This information is not intended to replace advice given to you by your health care provider. Make sure you discuss any questions you have with your health care provider.   Document Released: 06/25/2005 Document Revised: 07/16/2014 Document Reviewed: 05/22/2013 Elsevier Interactive Patient Education 2016 [employer]. How to Avoid  Diabetes Problems You can do a lot to prevent or slow down diabetes problems. Following your diabetes plan and taking care of yourself can reduce your risk of serious or life-threatening complications. Below, you will find certain things you can do to prevent diabetes problems. MANAGE YOUR DIABETES Follow your health care provider's, nurse educator's, and dietitian's instructions for managing your diabetes. They will teach you the basics of diabetes care. They can help answer questions you may have. Learn about diabetes and make healthy choices regarding eating and physical activity. Monitor your blood glucose level regularly. Your health care provider will help you decide how often to check your blood glucose level depending on your treatment goals and how well you are meeting them.  DO NOT USE NICOTINE Nicotine and diabetes are a dangerous combination. Nicotine raises your risk for diabetes problems. If you quit using nicotine, you will lower your risk for heart attack, stroke, nerve disease, and kidney disease. Your cholesterol and your blood pressure levels may improve. Your blood circulation will also improve. Do not use any tobacco products, including cigarettes, chewing tobacco, or electronic cigarettes. If you need help quitting, ask your health care provider. KEEP YOUR BLOOD PRESSURE UNDER CONTROL Your health care provider will determine your individualized target blood pressure based on your age, your medicines, how long you have had diabetes, and any other medical conditions you have. Blood pressure consists of two numbers. Generally, the goal is to keep your top number (systolic pressure) at or below 130, and your bottom number (diastolic pressure) at or below 80. Your health care provider may recommend a lower target blood pressure reading, if appropriate. Meal planning, medicines, and exercise can help you reach your target blood pressure. Make sure your health care provider checks your blood  pressure at every visit. KEEP YOUR CHOLESTEROL UNDER CONTROL Normal cholesterol levels will help prevent heart disease and stroke. These are the biggest health problems for people with diabetes. Keeping cholesterol levels under control can also help with blood flow. Have your cholesterol level checked at least once a year. Your health care provider may prescribe a medicine known as a statin. Statins lower your cholesterol. If you are not taking a statin, ask your health care provider if you should be. Meal planning, exercise, and medicines can help you reach your cholesterol targets.  SCHEDULE AND KEEP YOUR ANNUAL PHYSICAL EXAMS AND EYE EXAMS Your health care provider will tell you how often he or she wants to see you depending on your plan of  treatment. It is important that you keep these appointments so that possible problems can be identified early and complications can be avoided or treated.  Every visit with your health care provider should include your weight, blood pressure, and an evaluation of your blood glucose control.  Your hemoglobin A1c should be checked:  At least twice a year if you are at your goal.  Every 3 months if there are changes in treatment.  If you are not meeting your goals.  Your blood lipids should be checked yearly. You should also be checked yearly to see if you have protein in your urine (microalbumin).  Schedule a dilated eye exam within 5 years of your diagnosis if you have type 1 diabetes, and then yearly. Schedule a dilated eye exam at diagnosis if you have type 2 diabetes, and then yearly. All exams thereafter can be extended to every 2 to 3 years if one or more exams have been normal. KEEP YOUR VACCINES CURRENT It is recommended that you receive a flu (influenza) vaccine every year. It is also recommended that you receive a pneumonia (pneumococcal) vaccine. If you are 83 years of age or older and have never received a pneumonia vaccine, this vaccine may be  given as a series of two separate shots. Ask your health care provider which additional vaccines may be recommended. TAKE CARE OF YOUR FEET  Diabetes may cause you to have a poor blood supply (circulation) to your legs and feet. Because of this, the skin may be thinner, break easier, and heal more slowly. You also may have nerve damage in your legs and feet, causing decreased feeling. You may not notice minor injuries to your feet that could lead to serious problems or infections. Taking care of your feet is very important. Visual foot exams are performed at every routine medical visit. The exams check for cuts, injuries, or other problems with the feet. A comprehensive foot exam should be done yearly. This includes visual inspection as well as assessing foot pulses and testing for loss of sensation. You should also do the following:  Inspect your feet daily for cuts, calluses, blisters, ingrown toenails, and signs of infection, such as redness, swelling, or pus.  Wash and dry your feet thoroughly, especially between the toes.  Avoid soaking your feet regularly in hot water baths.  Moisturize dry skin with lotion, avoiding areas between your toes.  Cut toenails straight across and file the edges.  Avoid shoes that do not fit well or have areas that irritate your skin.  Avoid going barefooted or wearing only socks. Your feet need protection. TAKE CARE OF YOUR TEETH People with poorly controlled diabetes are more likely to have gum (periodontal) disease. These infections make diabetes harder to control. Periodontal diseases, if left untreated, can lead to tooth loss. Brush your teeth twice a day, floss, and see your dentist for checkups and cleaning every 6 months, or 2 times a year. ASK YOUR HEALTH CARE PROVIDER ABOUT TAKING ASPIRIN Taking aspirin daily is recommended to help prevent cardiovascular disease in people with and without diabetes. Ask your health care provider if this would benefit  you and what dose he or she would recommend. DRINK RESPONSIBLY Moderate amounts of alcohol (less than 1 drink per day for adult women and less than 2 drinks per day for adult men) have a minimal effect on blood glucose if ingested with food. It is important to eat food with alcohol to avoid hypoglycemia. People should avoid alcohol if  they have a history of alcohol abuse or dependence, if they are pregnant, and if they have liver disease, pancreatitis, advanced neuropathy, or severe hypertriglyceridemia. LESSEN STRESS Living with diabetes can be stressful. When you are under stress, your blood glucose may be affected in two ways:  Stress hormones may cause your blood glucose to rise.  You may be distracted from taking good care of yourself. It is a good idea to be aware of your stress level and make changes that are necessary to help you better manage challenging situations. Support groups, planned relaxation, a hobby you enjoy, meditation, healthy relationships, and exercise all work to lower your stress level. If your efforts do not seem to be helping, get help from your health care provider or a trained mental health professional.   This information is not intended to replace advice given to you by your health care provider. Make sure you discuss any questions you have with your health care provider.   Document Released: 03/13/2011 Document Revised: 07/16/2014 Document Reviewed: 08/19/2013 Elsevier Interactive Patient Education [employer]2016 Elsevier Inc.

## 2015-05-20 NOTE — Progress Notes (Signed)
Pt called in regarding problem filling prescription at [facility]. "provider not on medicaid list"

## 2015-08-03 ENCOUNTER — Ambulatory Visit (INDEPENDENT_AMBULATORY_CARE_PROVIDER_SITE_OTHER): Payer: Medicaid Other | Admitting: Family Medicine

## 2015-08-03 ENCOUNTER — Encounter: Payer: Self-pay | Admitting: Family Medicine

## 2015-08-03 VITALS — BP 116/83 | HR 95 | Temp 98.6°F | Ht 69.0 in | Wt 202.7 lb

## 2015-08-03 DIAGNOSIS — E1065 Type 1 diabetes mellitus with hyperglycemia: Secondary | ICD-10-CM | POA: Diagnosis not present

## 2015-08-03 DIAGNOSIS — R87612 Low grade squamous intraepithelial lesion on cytologic smear of cervix (LGSIL): Secondary | ICD-10-CM

## 2015-08-03 DIAGNOSIS — F4321 Adjustment disorder with depressed mood: Secondary | ICD-10-CM | POA: Diagnosis not present

## 2015-08-03 DIAGNOSIS — N870 Mild cervical dysplasia: Secondary | ICD-10-CM | POA: Diagnosis not present

## 2015-08-03 DIAGNOSIS — E108 Type 1 diabetes mellitus with unspecified complications: Secondary | ICD-10-CM

## 2015-08-03 DIAGNOSIS — IMO0002 Reserved for concepts with insufficient information to code with codable children: Secondary | ICD-10-CM

## 2015-08-03 LAB — POCT GLYCOSYLATED HEMOGLOBIN (HGB A1C): Hemoglobin A1C: 12.6

## 2015-08-03 MED ORDER — SERTRALINE HCL 50 MG PO TABS: 50.0000 mg | ORAL_TABLET | Freq: Every day | ORAL | Status: DC

## 2015-08-03 MED ORDER — INSULIN GLARGINE 100 UNIT/ML ~~LOC~~ SOLN: 38.0000 [IU] | Freq: Every day | SUBCUTANEOUS | Status: DC

## 2015-08-03 MED ORDER — INSULIN ASPART 100 UNIT/ML ~~LOC~~ SOLN: 25.0000 [IU] | Freq: Three times a day (TID) | SUBCUTANEOUS | Status: DC

## 2015-08-03 NOTE — Progress Notes (Signed)
    Subjective:    Patient ID: Christie [name] is a 30 y.o. female presenting with Establish Care  on 08/03/2015  HPI: She is a new transfer for diabetes. She is a type 1--diagnosis at 4 years ago. Previous care through [facility]. Has not had insulin Novolog for a while.  No idea of last Hgb A1C. In Care Everywhere her A1C was 13.2 in 01/2015. Previously discussed pump with Endocrinology but has not had her insurance straight. Reports feeling stressed and depressed.  PHQ-9 score is 18.  Review of Systems  Constitutional: Negative for fever and chills.  Respiratory: Negative for shortness of breath.   Cardiovascular: Negative for chest pain.  Gastrointestinal: Negative for nausea, vomiting and abdominal pain.  Genitourinary: Negative for dysuria.  Skin: Negative for rash.      Objective:    BP 116/83 mmHg  Pulse 95  Temp(Src) 98.6 F (37 C) (Oral)  Ht  (1.753 m)  Wt 202 lb 11.2 oz (91.944 kg)  BMI 29.92 kg/m2  LMP 07/27/2015 Physical Exam  Constitutional: She is oriented to person, place, and time. She appears well-developed and well-nourished. No distress.  HENT:  Head: Normocephalic and atraumatic.  Eyes: No scleral icterus.  Neck: Neck supple.  Cardiovascular: Normal rate.   Pulmonary/Chest: Effort normal.  Abdominal: Soft.  Neurological: She is alert and oriented to person, place, and time.  Skin: Skin is warm and dry.  Psychiatric: She has a normal mood and affect.        Assessment & Plan:   Problem List Items Addressed This Visit      Unprioritized   Type I diabetes mellitus with complication, uncontrolled (HCC) - Primary (Chronic)    I have increased all insulin with Lantus to 38 and novolog to 25 with meals--keep log and return in 4 wks to re-eval.  Long-term goal is for insulin pump.      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      Adjustment disorder with depressed mood    Trial of Zoloft, on this previously--begin 1/2 tab x 5-7 days.  At 4 wk f/u will adjust up if needed.      Relevant Medications   sertraline (ZOLOFT) 50 MG tablet      Total face-to-face time with patient: 30 minutes. Over 50% of encounter was spent on counseling and coordination of care. Return in about 3 months (around 11/01/2015).  PRATT,TANYA S 08/03/2015 1:56 PM

## 2015-08-03 NOTE — Patient Instructions (Addendum)
Diabetes and Standards of Medical Care Diabetes is complicated. You may find that your diabetes team includes a dietitian, nurse, diabetes educator, eye doctor, and more. To help everyone know what is going on and to help you get the care you deserve, the following schedule of care was developed to help keep you on track. Below are the tests, exams, vaccines, medicines, education, and plans you will need. HbA1c test This test shows how well you have controlled your glucose over the past 2-3 months. It is used to see if your diabetes management plan needs to be adjusted.   It is performed at least 2 times a year if you are meeting treatment goals.  It is performed 4 times a year if therapy has changed or if you are not meeting treatment goals. Blood pressure test  This test is performed at every routine medical visit. The goal is less than 140/90 mm Hg for most people, but 130/80 mm Hg in some cases. Ask your health care provider about your goal. Dental exam  Follow up with the dentist regularly. Eye exam  If you are diagnosed with type 1 diabetes as a child, get an exam upon reaching the age of 80 years or older and having had diabetes for 3-5 years. Yearly eye exams are recommended after that initial eye exam.  If you are diagnosed with type 1 diabetes as an adult, get an exam within 5 years of diagnosis and then yearly.  If you are diagnosed with type 2 diabetes, get an exam as soon as possible after the diagnosis and then yearly. Foot care exam  Visual foot exams are performed at every routine medical visit. The exams check for cuts, injuries, or other problems with the feet.  You should have a complete foot exam performed every year. This exam includes an inspection of the structure and skin of your feet, a check of the pulses in your feet, and a check of the sensation in your feet.  Type 1 diabetes: The first exam is performed 5 years after diagnosis.  Type 2 diabetes: The first  exam is performed at the time of diagnosis.  Check your feet nightly for cuts, injuries, or other problems with your feet. Tell your health care provider if anything is not healing. Kidney function test (urine microalbumin)  This test is performed once a year.  Type 1 diabetes: The first test is performed 5 years after diagnosis.  Type 2 diabetes: The first test is performed at the time of diagnosis.  A serum creatinine and estimated glomerular filtration rate (eGFR) test is done once a year to assess the level of chronic kidney disease (CKD), if present. Lipid profile (cholesterol, HDL, LDL, triglycerides)  Performed every 5 years for most people.  The goal for LDL is less than 100 mg/dL. If you are at high risk, the goal is less than 70 mg/dL.  The goal for HDL is 40 mg/dL-50 mg/dL for men and 50 mg/dL-60 mg/dL for women. An HDL cholesterol of 60 mg/dL or higher gives some protection against heart disease.  The goal for triglycerides is less than 150 mg/dL. Immunizations  The flu (influenza) vaccine is recommended yearly for every person 30 months of age or older who has diabetes.  The pneumonia (pneumococcal) vaccine is recommended for every person 38 years of age or older who has diabetes. Adults 57 years of age or older may receive the pneumonia vaccine as a series of two separate shots.  The hepatitis B  vaccine is recommended for adults shortly after they have been diagnosed with diabetes.  The Tdap (tetanus, diphtheria, and pertussis) vaccine should be given:  According to normal childhood vaccination schedules, for children.  Every 10 years, for adults who have diabetes. Diabetes self-management education  Education is recommended at diagnosis and ongoing as needed. Treatment plan  Your treatment plan is reviewed at every medical visit.   This information is not intended to replace advice given to you by your health care provider. Make sure you discuss any questions you  have with your health care provider.   Document Released: 04/22/2009 Document Revised: 07/16/2014 Document Reviewed: 11/25/2012 Elsevier Interactive Patient Education 2016 Elsevier Inc. Major Depressive Disorder Major depressive disorder is a mental illness. It also may be called clinical depression or unipolar depression. Major depressive disorder usually causes feelings of sadness, hopelessness, or helplessness. Some people with this disorder do not feel particularly sad but lose interest in doing things they used to enjoy (anhedonia). Major depressive disorder also can cause physical symptoms. It can interfere with work, school, relationships, and other normal everyday activities. The disorder varies in severity but is longer lasting and more serious than the sadness we all feel from time to time in our lives. Major depressive disorder often is triggered by stressful life events or major life changes. Examples of these triggers include divorce, loss of your job or home, a move, and the death of a family member or close friend. Sometimes this disorder occurs for no obvious reason at all. People who have family members with major depressive disorder or bipolar disorder are at higher risk for developing this disorder, with or without life stressors. Major depressive disorder can occur at any age. It may occur just once in your life (single episode major depressive disorder). It may occur multiple times (recurrent major depressive disorder). SYMPTOMS People with major depressive disorder have either anhedonia or depressed mood on nearly a daily basis for at least 2 weeks or longer. Symptoms of depressed mood include:  Feelings of sadness (blue or down in the dumps) or emptiness.  Feelings of hopelessness or helplessness.  Tearfulness or episodes of crying (may be observed by others).  Irritability (children and adolescents). In addition to depressed mood or anhedonia or both, people with this  disorder have at least four of the following symptoms:  Difficulty sleeping or sleeping too much.   Significant change (increase or decrease) in appetite or weight.   Lack of energy or motivation.  Feelings of guilt and worthlessness.   Difficulty concentrating, remembering, or making decisions.  Unusually slow movement (psychomotor retardation) or restlessness (as observed by others).   Recurrent wishes for death, recurrent thoughts of self-harm (suicide), or a suicide attempt. People with major depressive disorder commonly have persistent negative thoughts about themselves, other people, and the world. People with severe major depressive disorder may experiencedistorted beliefs or perceptions about the world (psychotic delusions). They also may see or hear things that are not real (psychotic hallucinations). DIAGNOSIS Major depressive disorder is diagnosed through an assessment by your health care provider. Your health care provider will ask aboutaspects of your daily life, such as mood,sleep, and appetite, to see if you have the diagnostic symptoms of major depressive disorder. Your health care provider may ask about your medical history and use of alcohol or drugs, including prescription medicines. Your health care provider also may do a physical exam and blood work. This is because certain medical conditions and the use of certain substances  can cause major depressive disorder-like symptoms (secondary depression). Your health care provider also may refer you to a mental health specialist for further evaluation and treatment. TREATMENT It is important to recognize the symptoms of major depressive disorder and seek treatment. The following treatments can be prescribed for this disorder:   Medicine. Antidepressant medicines usually are prescribed. Antidepressant medicines are thought to correct chemical imbalances in the brain that are commonly associated with major depressive  disorder. Other types of medicine may be added if the symptoms do not respond to antidepressant medicines alone or if psychotic delusions or hallucinations occur.  Talk therapy. Talk therapy can be helpful in treating major depressive disorder by providing support, education, and guidance. Certain types of talk therapy also can help with negative thinking (cognitive behavioral therapy) and with relationship issues that trigger this disorder (interpersonal therapy). A mental health specialist can help determine which treatment is best for you. Most people with major depressive disorder do well with a combination of medicine and talk therapy. Treatments involving electrical stimulation of the brain can be used in situations with extremely severe symptoms or when medicine and talk therapy do not work over time. These treatments include electroconvulsive therapy, transcranial magnetic stimulation, and vagal nerve stimulation.   This information is not intended to replace advice given to you by your health care provider. Make sure you discuss any questions you have with your health care provider.   Document Released: 10/20/2012 Document Revised: 07/16/2014 Document Reviewed: 10/20/2012 Elsevier Interactive Patient Education [employer].

## 2015-08-03 NOTE — Assessment & Plan Note (Signed)
Trial of Zoloft, on this previously--begin 1/2 tab x 5-7 days.  At 4 wk f/u will adjust up if needed.

## 2015-08-03 NOTE — Assessment & Plan Note (Signed)
Needs pap--will get to this in next few visits.

## 2015-08-03 NOTE — Assessment & Plan Note (Signed)
I have increased all insulin with Lantus to 38 and novolog to 25 with meals--keep log and return in 4 wks to re-eval.  Long-term goal is for insulin pump.

## 2015-09-02 ENCOUNTER — Encounter: Payer: Self-pay | Admitting: Family Medicine

## 2015-09-02 ENCOUNTER — Ambulatory Visit (INDEPENDENT_AMBULATORY_CARE_PROVIDER_SITE_OTHER): Payer: Medicaid Other | Admitting: Family Medicine

## 2015-09-02 ENCOUNTER — Other Ambulatory Visit (HOSPITAL_COMMUNITY)
Admission: RE | Admit: 2015-09-02 | Discharge: 2015-09-02 | Disposition: A | Discharge status: Home / Self Care | Payer: Medicaid Other | Source: Ambulatory Visit | Attending: Family Medicine | Admitting: Family Medicine

## 2015-09-02 VITALS — BP 110/68 | HR 88 | Temp 98.6°F | Ht 69.0 in | Wt 204.0 lb

## 2015-09-02 DIAGNOSIS — Z1151 Encounter for screening for human papillomavirus (HPV): Secondary | ICD-10-CM | POA: Insufficient documentation

## 2015-09-02 DIAGNOSIS — B977 Papillomavirus as the cause of diseases classified elsewhere: Secondary | ICD-10-CM

## 2015-09-02 DIAGNOSIS — Z20828 Contact with and (suspected) exposure to other viral communicable diseases: Secondary | ICD-10-CM

## 2015-09-02 DIAGNOSIS — R8789 Other abnormal findings in specimens from female genital organs: Secondary | ICD-10-CM | POA: Diagnosis not present

## 2015-09-02 DIAGNOSIS — Z202 Contact with and (suspected) exposure to infections with a predominantly sexual mode of transmission: Secondary | ICD-10-CM

## 2015-09-02 DIAGNOSIS — N870 Mild cervical dysplasia: Secondary | ICD-10-CM | POA: Diagnosis not present

## 2015-09-02 DIAGNOSIS — R87618 Other abnormal cytological findings on specimens from cervix uteri: Secondary | ICD-10-CM

## 2015-09-02 DIAGNOSIS — N898 Other specified noninflammatory disorders of vagina: Secondary | ICD-10-CM | POA: Diagnosis not present

## 2015-09-02 DIAGNOSIS — Z01411 Encounter for gynecological examination (general) (routine) with abnormal findings: Secondary | ICD-10-CM | POA: Insufficient documentation

## 2015-09-02 DIAGNOSIS — Z0000 Encounter for general adult medical examination without abnormal findings: Secondary | ICD-10-CM | POA: Diagnosis not present

## 2015-09-02 DIAGNOSIS — N760 Acute vaginitis: Secondary | ICD-10-CM

## 2015-09-02 DIAGNOSIS — Z124 Encounter for screening for malignant neoplasm of cervix: Secondary | ICD-10-CM

## 2015-09-02 DIAGNOSIS — Z113 Encounter for screening for infections with a predominantly sexual mode of transmission: Secondary | ICD-10-CM | POA: Diagnosis present

## 2015-09-02 LAB — POCT WET PREP (WET MOUNT): CLUE CELLS WET PREP WHIFF POC: NEGATIVE

## 2015-09-02 LAB — HIV ANTIBODY (ROUTINE TESTING W REFLEX): HIV 1&2 Ab, 4th Generation: NONREACTIVE

## 2015-09-02 NOTE — Progress Notes (Signed)
   CLINIC ENCOUNTER NOTE  History:  30 y.o. [id number] here today for STD testing/well woman exam. She denies any abnormal vaginal discharge, bleeding, pelvic pain or other concerns.   Past Medical History  Diagnosis Date  . Asthma   . Hx of migraines   . Anemia   . Depression   . Fx ankle     history of left ankle fx at age 69  . Pregnancy induced hypertension   . Diabetes mellitus     type 1 on insulin  . Headache(784.0)   . Urinary tract infection   . Chlamydia   . Abnormal Pap smear     colpo scheduled  . Anxiety     Past Surgical History  Procedure Laterality Date  . Wisdom tooth extraction    . Colposcopy      The following portions of the patient's history were reviewed and updated as appropriate: allergies, current medications, past family history, past medical history, past social history, past surgical history and problem list.   Health Maintenance:  Normal pap and negative HRHPV on 01/25/2014, ASCUS, HPV POS  With colpo that showed CIN I  Review of Systems:  Pertinent items noted in HPI and remainder of comprehensive ROS otherwise negative.  Objective:  Physical Exam BP 110/68 mmHg  Pulse 88  Temp(Src) 98.6 F (37 C) (Oral)  Ht  (1.753 m)  Wt 204 lb (92.534 kg)  BMI 30.11 kg/m2  SpO2 98%  LMP 08/26/2015 CONSTITUTIONAL: Well-developed, well-nourished female in no acute distress.  HENT:  Normocephalic, atraumatic. External right and left ear normal. Oropharynx is clear and moist EYES: Conjunctivae and EOM are normal. Pupils are equal, round, and reactive to light. No scleral icterus.  NECK: Normal range of motion, supple, no masses SKIN: Skin is warm and dry. No rash noted. Not diaphoretic. No erythema. No pallor. NEUROLGIC: Alert and oriented to person, place, and time. Normal reflexes, muscle tone coordination. No cranial nerve deficit noted. PSYCHIATRIC: Normal mood and affect. Normal behavior. Normal judgment and thought content. CARDIOVASCULAR:  Normal heart rate noted RESPIRATORY: Effort and breath sounds normal, no problems with respiration noted ABDOMEN: Soft, no distention noted.   PELVIC: Normal appearing external genitalia; normal appearing vaginal mucosa and cervix.  No abnormal discharge noted.  Normal uterine size, no other palpable masses, no uterine or adnexal tenderness. MUSCULOSKELETAL: Normal range of motion. No edema noted.  Labs and Imaging No results found.  Assessment & Plan:  1. Vaginal discharge - POCT Wet Prep ([place]et [place]) - GC/Chlamydia probe amp ([place])not at [facility]  2. Vaginitis and vulvovaginitis - POCT Wet Prep ([place]) - GC/Chlamydia probe amp ([place])not at [facility]  3. Screening for malignant neoplasm of cervix - Cytology - PAP - patient with h/o abnormal pap with colpo showing CIN 1 in 2015 but has not received follow up pap.   4. Contact with or exposure to viral disease - HIV antibody (with reflex)  5. Contact with or exposure to venereal disease - RPR   Routine preventative health maintenance measures emphasized. Please refer to After Visit Summary for other counseling recommendations.   Return in about 1 year (around 09/01/2016) for Annual exam.

## 2015-09-03 LAB — RPR

## 2015-09-06 LAB — CYTOLOGY - PAP

## 2015-09-06 LAB — GC/CHLAMYDIA PROBE AMP (~~LOC~~) NOT AT ARMC
Chlamydia: NEGATIVE
Neisseria Gonorrhea: NEGATIVE

## 2015-09-07 ENCOUNTER — Telehealth: Payer: Self-pay | Admitting: Family Medicine

## 2015-09-07 NOTE — Telephone Encounter (Signed)
All STI screenings were negative. She does not have an STIs. Her pap smear is still not resulted and will send a message to the pool when that result comes back and she will be called.

## 2015-09-07 NOTE — Telephone Encounter (Signed)
Would like test results. °

## 2015-09-07 NOTE — Telephone Encounter (Signed)
Will forward to Dr. Alvester Morin for test results. Jazlynn Nemetz,CMA

## 2015-09-08 ENCOUNTER — Telehealth: Payer: Self-pay | Admitting: *Deleted

## 2015-09-08 NOTE — Telephone Encounter (Signed)
Spoke with patient regarding results and she voiced understanding.  She will await call from obgyn clinic. Jazmin Hartsell,CMA

## 2015-09-08 NOTE — Telephone Encounter (Signed)
-----   Message from Federico Flake, MD sent at 09/08/2015  9:46 AM EST ----- Please call the patient ASAP and let her know her pap smear was abnormal. There is evidence that the changes that were noted on her last pap smear have progressed. We need to do a colposcopy.  I will message the OBGYN clinic to get her scheduled for a colposcopy. Dr. Alvester Morin

## 2015-09-08 NOTE — Telephone Encounter (Signed)
Received call from [facility] to inform about this pt PAP.  Saw previous notes that pt had been made aware of the message from Dr. Alvester Morin. [name], [name] D, [place]

## 2015-09-21 ENCOUNTER — Encounter: Payer: Medicaid Other | Admitting: Family Medicine

## 2015-09-22 ENCOUNTER — Encounter: Payer: Self-pay | Admitting: Family Medicine

## 2015-09-22 ENCOUNTER — Other Ambulatory Visit (HOSPITAL_COMMUNITY)
Admission: RE | Admit: 2015-09-22 | Discharge: 2015-09-22 | Disposition: A | Discharge status: Home / Self Care | Payer: Medicaid Other | Source: Ambulatory Visit | Attending: Family Medicine | Admitting: Family Medicine

## 2015-09-22 ENCOUNTER — Ambulatory Visit (INDEPENDENT_AMBULATORY_CARE_PROVIDER_SITE_OTHER): Payer: Medicaid Other | Admitting: Family Medicine

## 2015-09-22 VITALS — BP 139/90 | HR 86 | Temp 98.2°F | Ht 69.0 in | Wt 205.2 lb

## 2015-09-22 DIAGNOSIS — F526 Dyspareunia not due to a substance or known physiological condition: Secondary | ICD-10-CM

## 2015-09-22 DIAGNOSIS — N898 Other specified noninflammatory disorders of vagina: Secondary | ICD-10-CM | POA: Diagnosis present

## 2015-09-22 DIAGNOSIS — Z113 Encounter for screening for infections with a predominantly sexual mode of transmission: Secondary | ICD-10-CM

## 2015-09-22 DIAGNOSIS — N760 Acute vaginitis: Secondary | ICD-10-CM | POA: Insufficient documentation

## 2015-09-22 DIAGNOSIS — IMO0001 Reserved for inherently not codable concepts without codable children: Secondary | ICD-10-CM

## 2015-09-22 NOTE — Progress Notes (Signed)
   Subjective:    Patient ID: Christie GravesKinyetta D Lentz, female    DOB: [birth year], 30 y.o.   MRN: [id number]  HPI Presents with vaginal discharge has been intermittent. She has been frequently checked for BV. She is concerned about continued BV, as well as SCDs. She has had a recent HIV test done, which was negative.  She also complains of dyspareunia which started 2 days ago after having sex. She reports that she had some mild pain during sex and more afterwards. She states that her sex was not further usual. Denies vaginal bleeding. Her pain is gradually been decreasing. No peeling or provoking factors. She denies fevers, chills, dysuria, abdominal pain.   Review of Systems     Objective:   Physical Exam  Constitutional: She appears well-developed and well-nourished.  HENT:  Head: Normocephalic and atraumatic.  Genitourinary: There is no rash, tenderness, lesion or injury on the right labia. There is no rash, tenderness, lesion or injury on the left labia. Cervix exhibits no discharge and no friability. No erythema, tenderness or bleeding in the vagina. No foreign body around the vagina. No signs of injury around the vagina. Vaginal discharge found.      Assessment & Plan:  1. Dyspareunia (not due to a general medical condition) Likely due to trauma. We'll obtain wet prep and GC chlamydia.  2. Vaginal discharge Affirm test and GC chlamydia obtained. Will call patient with results. - Cervicovaginal ancillary only - GC/Chlamydia probe amp ([place])not at [facility]

## 2015-09-23 ENCOUNTER — Other Ambulatory Visit: Payer: Self-pay | Admitting: Family Medicine

## 2015-09-23 LAB — CERVICOVAGINAL ANCILLARY ONLY: Wet Prep (BD Affirm): POSITIVE — AB

## 2015-09-23 LAB — GC/CHLAMYDIA PROBE AMP (~~LOC~~) NOT AT ARMC
Chlamydia: NEGATIVE
NEISSERIA GONORRHEA: NEGATIVE

## 2015-09-23 MED ORDER — METRONIDAZOLE 500 MG PO TABS: 500.0000 mg | ORAL_TABLET | Freq: Two times a day (BID) | ORAL | Status: DC

## 2015-10-04 ENCOUNTER — Other Ambulatory Visit (HOSPITAL_COMMUNITY)
Admission: RE | Admit: 2015-10-04 | Discharge: 2015-10-04 | Disposition: A | Discharge status: Home / Self Care | Payer: Medicaid Other | Source: Ambulatory Visit | Attending: Family Medicine | Admitting: Family Medicine

## 2015-10-04 ENCOUNTER — Ambulatory Visit: Payer: Medicaid Other | Admitting: Family Medicine

## 2015-10-04 ENCOUNTER — Encounter: Payer: Self-pay | Admitting: Family Medicine

## 2015-10-04 ENCOUNTER — Encounter: Payer: Self-pay | Admitting: General Practice

## 2015-10-04 VITALS — BP 112/68 | HR 72 | Temp 98.5°F | Wt 206.7 lb

## 2015-10-04 DIAGNOSIS — R87613 High grade squamous intraepithelial lesion on cytologic smear of cervix (HGSIL): Secondary | ICD-10-CM | POA: Diagnosis present

## 2015-10-04 DIAGNOSIS — N871 Moderate cervical dysplasia: Secondary | ICD-10-CM

## 2015-10-04 DIAGNOSIS — D069 Carcinoma in situ of cervix, unspecified: Secondary | ICD-10-CM | POA: Diagnosis not present

## 2015-10-04 LAB — POCT PREGNANCY, URINE: PREG TEST UR: NEGATIVE

## 2015-10-04 LAB — GLUCOSE, CAPILLARY: Glucose-Capillary: 434 mg/dL — ABNORMAL HIGH (ref 65–99)

## 2015-10-04 NOTE — Progress Notes (Signed)
Patient ID: Christie GravesKinyetta D [name], female   DOB: 06-Nov-1985, 30 y.o.   MRN: [id number] Patient nauseated after procedure. BS checked and was 436. She took Lantus this AM but no novolog  Instructed to take 10u Novolog when she arrived at home and another 10u if > 250. Voiced understanding  Federico FlakeKimberly Niles Jaterrius Ricketson, MD

## 2015-10-04 NOTE — Progress Notes (Addendum)
Patient ID: Christie [name], female   DOB: 11-Jul-1985, 30 y.o.   MRN: [id number]    GYNECOLOGY CLINIC COLPOSCOPY PROCEDURE NOTE  30 y.o. [id number] here for colposcopy for high-grade squamous intraepithelial neoplasia  (HGSIL-encompassing moderate and severe dysplasia) pap smear on 09/02/2015. Discussed role for HPV in cervical dysplasia, need for surveillance.  Patient given informed consent, signed copy in the chart, time out was performed.  Placed in lithotomy position. Cervix viewed with speculum and colposcope after application of acetic acid.   Colposcopy adequate? No- unable to see the end of the lesion in the endocervix  acetowhite lesion(s) noted at 9-11 o'clock, punctation noted at 10 o'clock and mosaicism and punctation noted at 10 o'clock; corresponding biopsies obtained.  ECC specimen obtained. All specimens were labelled and sent to pathology.  Physical Exam  Genitourinary:       Patient was given post procedure instructions.  Will follow up pathology and manage accordingly.  Routine preventative health maintenance measures emphasized.  Federico FlakeKimberly Niles Latrish Mogel, MD , MPH, ABFM Family Medicine, OB Fellow [facility]

## 2015-10-11 ENCOUNTER — Telehealth: Payer: Self-pay | Admitting: *Deleted

## 2015-10-11 NOTE — Telephone Encounter (Signed)
Informed patient of results. Advised that we will be in touch with the appointment as soon as we hear back from Dr. Alvester MorinNewton.

## 2015-10-11 NOTE — Telephone Encounter (Signed)
Per Dr. 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 Called patient and heard message that her voicemail is full. Will try to call her again tomorrow.

## 2015-10-17 ENCOUNTER — Ambulatory Visit (INDEPENDENT_AMBULATORY_CARE_PROVIDER_SITE_OTHER): Payer: Medicaid Other | Admitting: Obstetrics & Gynecology

## 2015-10-17 ENCOUNTER — Other Ambulatory Visit (HOSPITAL_COMMUNITY)
Admission: RE | Admit: 2015-10-17 | Discharge: 2015-10-17 | Disposition: A | Discharge status: Home / Self Care | Payer: Medicaid Other | Source: Ambulatory Visit | Attending: Obstetrics & Gynecology | Admitting: Obstetrics & Gynecology

## 2015-10-17 ENCOUNTER — Encounter: Payer: Self-pay | Admitting: Obstetrics & Gynecology

## 2015-10-17 VITALS — BP 122/103 | HR 101 | Wt 219.0 lb

## 2015-10-17 DIAGNOSIS — R87613 High grade squamous intraepithelial lesion on cytologic smear of cervix (HGSIL): Secondary | ICD-10-CM

## 2015-10-17 DIAGNOSIS — D069 Carcinoma in situ of cervix, unspecified: Secondary | ICD-10-CM | POA: Diagnosis not present

## 2015-10-17 HISTORY — PX: LEEP: SHX91

## 2015-10-17 LAB — POCT PREGNANCY, URINE: Preg Test, Ur: NEGATIVE

## 2015-10-17 NOTE — Progress Notes (Signed)
Patient ID: Octavio GravesKinyetta D Wisby, female   DOB: December 08, 1985, 30 y.o.   MRN: [id number] Pap smear and colposcopy reviewed.   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 Colpo Biopsy/ECC 10/04/2015 Diagnosis 1. Cervix, biopsy, 10 o'clock - TRANSFORMATION ZONE MUCOSA WITH HIGH GRADE SQUAMOUS INTRAEPITHELIAL LESION, CIN-III (SEVERE DYSPLASIA/CIS), INVOLVING ENDOCERVICAL GLANDS. - BACKGROUND OF LOW GRADE SQUAMOUS INTRAEPITHELIAL LESION, CIN-I (MILD DYSPLASIA). 2. Endocervix, curettage - DETACHED MINUTE DYSPLASTIC SQUAMOUS FRAGMENTS SHOWING HIGH GRADE SQUAMOUS INTRAEPITHELIAL LESION (HIGH GRADE SQUAMOUS DYSPLASIA). - DETACHED BENIGN ENDOCERVICAL MUCOSA. - SEE COMMENT.  Risks, benefits, alternatives, and limitations of procedure explained to patient, including pain, bleeding, infection, failure to remove abnormal tissue and failure to cure dysplasia, need for repeat procedures, damage to pelvic organs, cervical incompetence.  Role of HPV,cervical dysplasia and need for close followup was empasized. Informed written consent was obtained. All questions were answered. Time out performed.  ??Procedure: The patient was placed in lithotomy position and the bivalved coated speculum was placed in the patient's vagina. A grounding pad placed on the patient. Local anesthesia was administered via an intracervical block using 10cc of 2% Lidocaine with epinephrine. The suction was turned on and the Large 1X Fisher Cone Biopsy Excisor on 50 Watts of cutting current was used to excise the area of decreased uptake and excise the entire transformation zone. Excellent hemostasis was achieved using roller ball coagulation set at 60 Watts coagulation current. Monsel's solution was then applied and excellent hemostasis was noted. The patient did have an excessive amount of bleeding that was controlled with cautery and the roller ball followed by Monsel's solution. The speculum was  removed from the vagina. Specimens were sent to pathology. ?The patient tolerated the procedure well. Post-operative instructions given to patient, including instruction to seek medical attention for persistent bright red bleeding, fever, abdominal/pelvic pain, dysuria, nausea or vomiting. She was also told about the possibility of having copious yellow to black tinged discharge. She was counseled to avoid anything in the vagina (sex/douching/tampons) for 4 weeks. She has a 4 week post-operative check to review results and assess wound healing. Follow up in 6 months for repeat pap or as needed.  Oney Tatlock L. Harraway-Smith, M.D., [facility] CoreFACOG

## 2015-10-17 NOTE — Patient Instructions (Signed)
Conization of the Cervix, Care After °Refer to this sheet in the next few weeks. These instructions provide you with information on caring for yourself after your procedure. Your health care provider may also give you more specific instructions. Your treatment has been planned according to current medical practices but problems sometimes occur. Call your health care provider if you have any problems or questions after your procedure. °WHAT TO EXPECT AFTER THE PROCEDURE °After your procedure, it is typical to have the following sensations: °· If you had a general anesthetic, you may be groggy for 2-3 hours after the procedure. °· You may have cramps (similar to menstrual cramps) for about 1 week.   °· You may have a bloody discharge or light to moderate bleeding for 1-2 weeks.  The bleeding should not be heavy (for example, it should not soak 1 pad in less than 1 hour). °· You may have a black vaginal discharge that looks similar to coffee grounds. This is from the paste that was applied to the cervix to control bleeding. This is normal. °Recovery may take up to 3 weeks.  °HOME CARE INSTRUCTIONS  °· Arrange for someone to drive you home after the procedure. °· Only take medicines as directed by your health care provider. Do not take aspirin. It can cause bleeding.   °· Take showers for the first week. Do not take baths, swim, or use hot tubs until your health care provider says it is okay.   °· Do not douche, use tampons, or have sexual intercourse until your health care provider says it is okay.   °· Avoid strenuous activities, exercises, and heavy lifting for at least 7-14 days. °· You may resume your normal diet unless your health care provider advises you differently.     °· If you are constipated, you may: °¨ Take a mild laxative as directed by your health care provider.   °¨ Add fruit and bran to your diet.   °¨ Make sure to drink enough fluids to keep your urine clear or pale yellow. °· Keep follow-up  appointments with your health care provider. °SEEK MEDICAL CARE IF:  °· You develop a rash.   °· You are dizzy or lightheaded.   °· You feel nauseous.   °· You develop a bad smelling vaginal discharge. °SEEK IMMEDIATE MEDICAL CARE IF:  °· You have blood clots or bleeding that is heavier than a normal menstrual period (for example, soaking a pad in less than 1 hour) or you develop bright red bleeding.   °· You have a fever over 101°F (38.3°C) or persistent symptoms for more than 2-3 days.   °· You have a fever over 101°F (38.3°C) and your symptoms suddenly get worse. °· You have increasing cramps.   °· You faint.   °· You have pain when urinating. °· You have bloody urine.   °· You start vomiting.   °· Your pain is not relieved with your medicine.   °· Your have severe or worsening pain. °MAKE SURE YOU: °· Understand these instructions. °· Will watch your condition. °· Will get help right away if you are not doing well or get worse. °  °This information is not intended to replace advice given to you by your health care provider. Make sure you discuss any questions you have with your health care provider. °  °Document Released: 06/25/2005 Document Revised: 06/30/2013 Document Reviewed: 12/19/2012 °Elsevier Interactive Patient Education ©2016 Elsevier Inc. ° °

## 2015-10-19 ENCOUNTER — Encounter: Payer: Self-pay | Admitting: *Deleted

## 2015-10-20 ENCOUNTER — Ambulatory Visit (INDEPENDENT_AMBULATORY_CARE_PROVIDER_SITE_OTHER): Payer: Medicaid Other | Admitting: Family Medicine

## 2015-10-20 ENCOUNTER — Telehealth: Payer: Self-pay | Admitting: *Deleted

## 2015-10-20 ENCOUNTER — Encounter: Payer: Self-pay | Admitting: Family Medicine

## 2015-10-20 VITALS — Temp 98.5°F | Ht 69.0 in | Wt 219.0 lb

## 2015-10-20 DIAGNOSIS — E1065 Type 1 diabetes mellitus with hyperglycemia: Secondary | ICD-10-CM

## 2015-10-20 DIAGNOSIS — E108 Type 1 diabetes mellitus with unspecified complications: Secondary | ICD-10-CM | POA: Diagnosis present

## 2015-10-20 DIAGNOSIS — IMO0002 Reserved for concepts with insufficient information to code with codable children: Secondary | ICD-10-CM

## 2015-10-20 LAB — POCT GLYCOSYLATED HEMOGLOBIN (HGB A1C): Hemoglobin A1C: 11.6

## 2015-10-20 MED ORDER — INSULIN GLARGINE 100 UNIT/ML ~~LOC~~ SOLN: 35.0000 [IU] | Freq: Every day | SUBCUTANEOUS | Status: DC

## 2015-10-20 NOTE — Telephone Encounter (Signed)
Pt informed of leep results.

## 2015-10-20 NOTE — Progress Notes (Signed)
    Subjective:    Patient ID: Christie [name] is a 30 y.o. female presenting with Annual Exam  on 10/20/2015  HPI: Here for f/u diabetes. She is not following a proper diet. She is trying to work and go to school and has junk food at the house. She has seen an eye MD who has cleared her eyes.  Review of Systems  Constitutional: Negative for fever and chills.  Respiratory: Negative for shortness of breath.   Cardiovascular: Negative for chest pain.  Gastrointestinal: Negative for nausea, vomiting and abdominal pain.  Genitourinary: Negative for dysuria.  Skin: Negative for rash.      Objective:    Temp(Src) 98.5 F (36.9 C) (Oral)  Ht 5\' 9"  (1.753 m)  Wt 219 lb (99.338 kg)  BMI 32.33 kg/m2  LMP 09/26/2015 (Exact Date) Physical Exam  Constitutional: She is oriented to person, place, and time. She appears well-developed and well-nourished. No distress.  HENT:  Head: Normocephalic and atraumatic.  Eyes: No scleral icterus.  Neck: Neck supple.  Cardiovascular: Normal rate.   Pulmonary/Chest: Effort normal.  Abdominal: Soft.  Neurological: She is alert and oriented to person, place, and time.  Skin: Skin is warm and dry.  Psychiatric: She has a normal mood and affect.        Assessment & Plan:   Problem List Items Addressed This Visit      Unprioritized   Type I diabetes mellitus with complication, uncontrolled (HCC) - Primary (Chronic)    To see nutrition--work on sliding scale with carb counting. Urine microalbumin and fasting lipid. Goal is Hgb A1C <7 before end of year.      Relevant Medications   insulin glargine (LANTUS) 100 UNIT/ML injection   Other Relevant Orders   POCT glycosylated hemoglobin (Hb A1C) (Completed)   Ambulatory referral to diabetic education   Lipid panel   Microalbumin / creatinine urine ratio      Total face-to-face time with patient: 25 minutes. Over 50% of encounter was spent on counseling and coordination of care. Return in  about 4 weeks (around 11/17/2015) for a follow-up.  Morrill Bomkamp S 10/20/2015 2:29 PM

## 2015-10-20 NOTE — Assessment & Plan Note (Addendum)
To see nutrition--work on sliding scale with carb counting. Urine microalbumin and fasting lipid. Goal is Hgb A1C <7 before end of year.

## 2015-10-20 NOTE — Patient Instructions (Signed)
Diabetes Mellitus and Food It is important for you to manage your blood sugar (glucose) level. Your blood glucose level can be greatly affected by what you eat. Eating healthier foods in the appropriate amounts throughout the day at about the same time each day will help you control your blood glucose level. It can also help slow or prevent worsening of your diabetes mellitus. Healthy eating may even help you improve the level of your blood pressure and reach or maintain a healthy weight.  General recommendations for healthful eating and cooking habits include:  Eating meals and snacks regularly. Avoid going long periods of time without eating to lose weight.  Eating a diet that consists mainly of plant-based foods, such as fruits, vegetables, nuts, legumes, and whole grains.  Using low-heat cooking methods, such as baking, instead of high-heat cooking methods, such as deep frying. Work with your dietitian to make sure you understand how to use the Nutrition Facts information on food labels. HOW CAN FOOD AFFECT ME? Carbohydrates Carbohydrates affect your blood glucose level more than any other type of food. Your dietitian will help you determine how many carbohydrates to eat at each meal and teach you how to count carbohydrates. Counting carbohydrates is important to keep your blood glucose at a healthy level, especially if you are using insulin or taking certain medicines for diabetes mellitus. Alcohol Alcohol can cause sudden decreases in blood glucose (hypoglycemia), especially if you use insulin or take certain medicines for diabetes mellitus. Hypoglycemia can be a life-threatening condition. Symptoms of hypoglycemia (sleepiness, dizziness, and disorientation) are similar to symptoms of having too much alcohol.  If your health care provider has given you approval to drink alcohol, do so in moderation and use the following guidelines:  Women should not have more than one drink per day, and men  should not have more than two drinks per day. One drink is equal to:  12 oz of beer.  5 oz of wine.  1 oz of hard liquor.  Do not drink on an empty stomach.  Keep yourself hydrated. Have water, diet soda, or unsweetened iced tea.  Regular soda, juice, and other mixers might contain a lot of carbohydrates and should be counted. WHAT FOODS ARE NOT RECOMMENDED? As you make food choices, it is important to remember that all foods are not the same. Some foods have fewer nutrients per serving than other foods, even though they might have the same number of calories or carbohydrates. It is difficult to get your body what it needs when you eat foods with fewer nutrients. Examples of foods that you should avoid that are high in calories and carbohydrates but low in nutrients include:  Trans fats (most processed foods list trans fats on the Nutrition Facts label).  Regular soda.  Juice.  Candy.  Sweets, such as cake, pie, doughnuts, and cookies.  Fried foods. WHAT FOODS CAN I EAT? Eat nutrient-rich foods, which will nourish your body and keep you healthy. The food you should eat also will depend on several factors, including:  The calories you need.  The medicines you take.  Your weight.  Your blood glucose level.  Your blood pressure level.  Your cholesterol level. You should eat a variety of foods, including:  Protein.  Lean cuts of meat.  Proteins low in saturated fats, such as fish, egg whites, and beans. Avoid processed meats.  Fruits and vegetables.  Fruits and vegetables that may help control blood glucose levels, such as apples, mangoes, and   yams.  Dairy products.  Choose fat-free or low-fat dairy products, such as milk, yogurt, and cheese.  Grains, bread, pasta, and rice.  Choose whole grain products, such as multigrain bread, whole oats, and brown rice. These foods may help control blood pressure.  Fats.  Foods containing healthful fats, such as nuts,  avocado, olive oil, canola oil, and fish. DOES EVERYONE WITH DIABETES MELLITUS HAVE THE SAME MEAL PLAN? Because every person with diabetes mellitus is different, there is not one meal plan that works for everyone. It is very important that you meet with a dietitian who will help you create a meal plan that is just right for you.   This information is not intended to replace advice given to you by your health care provider. Make sure you discuss any questions you have with your health care provider.   Document Released: 03/22/2005 Document Revised: 07/16/2014 Document Reviewed: 05/22/2013 Elsevier Interactive Patient Education 2016 Elsevier Inc.  

## 2015-10-21 LAB — MICROALBUMIN / CREATININE URINE RATIO
CREATININE, URINE: 171 mg/dL (ref 20–320)
MICROALB UR: 2.5 mg/dL
MICROALB/CREAT RATIO: 15 ug/mg{creat} (ref <30)

## 2015-11-02 ENCOUNTER — Encounter: Payer: Medicaid Other | Attending: Family Medicine | Admitting: *Deleted

## 2015-11-02 ENCOUNTER — Encounter: Payer: Self-pay | Admitting: *Deleted

## 2015-11-02 VITALS — Ht 69.0 in | Wt 221.1 lb

## 2015-11-02 DIAGNOSIS — IMO0002 Reserved for concepts with insufficient information to code with codable children: Secondary | ICD-10-CM

## 2015-11-02 DIAGNOSIS — E1065 Type 1 diabetes mellitus with hyperglycemia: Secondary | ICD-10-CM

## 2015-11-02 DIAGNOSIS — E108 Type 1 diabetes mellitus with unspecified complications: Secondary | ICD-10-CM | POA: Insufficient documentation

## 2015-11-02 NOTE — Patient Instructions (Addendum)
Plan:  Aim for 3 Carb Choices per meal (45 grams) +/- 1 either way  Aim for 0-2 Carbs per snack if hungry  Include protein in moderation with your meals and snacks Consider reading food labels for Total Carbohydrate of foods Consider checking BG at alternate times per day  Continue taking diabetes medication as directed by MD

## 2015-11-02 NOTE — Progress Notes (Signed)
Diabetes Self-Management Education  Visit Type: First/Initial  Appt. Start Time: 1500 Appt. End Time: 1630  11/02/2015  Christie [name], identified by name and date of birth, is a 30 y.o. female with a diagnosis of Diabetes: Type 1.   ASSESSMENT  Height  (1.753 m), weight 221 lb 1.6 oz (100.29 kg), last menstrual period 09/26/2015. Body mass index is 32.64 kg/(m^2).      Diabetes Self-Management Education - 11/02/15 1508    Visit Information   Visit Type First/Initial   Initial Visit   Diabetes Type Type 1   Are you currently following a meal plan? No   Are you taking your medications as prescribed? No  not always   Date Diagnosed 4 years ago   Health Coping   How would you rate your overall health? Fair   Psychosocial Assessment   Patient Belief/Attitude about Diabetes Defeat/Burnout   Self-management support Family;Doctor's office   Other persons present Patient   Preferred Learning Style Hands on   What is the last grade level you completed in school? 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 1-2 times/day   Fasting Blood glucose range (mg/dL) 91-478   Number of hypoglycemic episodes per month 2   Can you tell when your blood sugar is low? Yes   What do you do if your blood sugar is low? fruit juice or fresh fruit   Have you had a dilated eye exam in the past 12 months? Yes   Are you checking your feet? No   Dietary Intake   Breakfast eating more often now, 2 boiled eggs and [place], occasionally with 1 toast   Snack (morning) nuts and cheese cubes OR grapefruit and yogurt   Lunch skips often OR boiled or scrambed eggs and [place], OR chicken salad on 1 slice of bread   Snack (afternoon) fresh fruit OR high fiber snack bars   Dinner boiled eggs and [place] or chicken, vegetables, infrequently some pasta   Snack (evening) no, too  tired   Beverage(s) water, apple juice, Koolaid   Exercise   Exercise Type ADL's   How many days per week to you exercise? 0   How many minutes per day do you exercise? 0   Total minutes per week of exercise 0   Patient Education   Previous Diabetes Education Yes (please comment)  Core Class for DM 2   Nutrition management  Role of diet in the treatment of diabetes and the relationship between the three main macronutrients and blood glucose level;Food label reading, portion sizes and measuring food.;Carbohydrate counting;Other (comment)  Incorporated a Insulin to Carb Ratio of 5 units per each Carb Choice of 15 grasm (= 1 unit / 3 grams of carbohydrate) per MD orders   Physical activity and exercise  Role of exercise on diabetes management, blood pressure control and cardiac health.   Medications Reviewed patients medication for diabetes, action, purpose, timing of dose and side effects.   Monitoring Identified appropriate SMBG and/or A1C goals.   Acute complications Taught treatment of hypoglycemia - the 15 rule.   Chronic complications Relationship between chronic complications and blood glucose control   Psychosocial adjustment Helped patient identify a support system for diabetes management  Informed her of DM 1 Support Group   Individualized Goals (developed by patient)   Nutrition Follow meal plan  discussed   Medications take my medication as prescribed   Monitoring  test blood glucose pre and post meals as discussed   Reducing Risk examine blood glucose patterns   Outcomes   Expected Outcomes Demonstrated interest in learning. Expect positive outcomes   Future DMSE 4-6 wks   Program Status Not Completed      Individualized Plan for Diabetes Self-Management Training:   Learning Objective:  Patient will have a greater understanding of diabetes self-management. Patient education plan is to attend individual and/or group sessions per assessed needs and concerns.   Plan:    Patient Instructions  Plan:  Aim for 3 Carb Choices per meal (45 grams) +/- 1 either way  Aim for 0-2 Carbs per snack if hungry  Include protein in moderation with your meals and snacks Consider reading food labels for Total Carbohydrate of foods Consider checking BG at alternate times per day  Continue taking diabetes medication as directed by MD Take 5 units of insulin for every Carb Choice of 15 grams (- 1 units per 3 grams of carbohydrate)      Expected Outcomes:  Demonstrated interest in learning. Expect positive outcomes  Education material provided: Living Well with Diabetes, A1C conversion sheet, Meal plan card and Carbohydrate counting sheet  If problems or questions, patient to contact team via:  Phone and Email  Future DSME appointment: 4-6 wks

## 2015-11-09 ENCOUNTER — Other Ambulatory Visit: Payer: Self-pay | Admitting: *Deleted

## 2015-11-09 DIAGNOSIS — E108 Type 1 diabetes mellitus with unspecified complications: Principal | ICD-10-CM

## 2015-11-09 DIAGNOSIS — IMO0002 Reserved for concepts with insufficient information to code with codable children: Secondary | ICD-10-CM

## 2015-11-09 DIAGNOSIS — E1065 Type 1 diabetes mellitus with hyperglycemia: Secondary | ICD-10-CM

## 2015-11-09 NOTE — Telephone Encounter (Signed)
Pt last seen in clinic 4/17, please advise.

## 2015-11-09 NOTE — Telephone Encounter (Signed)
Pt is out of insulin as of today. [name], Maryjo RochesterJessica Dawn, CMA

## 2015-11-10 MED ORDER — INSULIN ASPART 100 UNIT/ML ~~LOC~~ SOLN: 25.0000 [IU] | Freq: Three times a day (TID) | SUBCUTANEOUS | Status: DC

## 2015-11-16 ENCOUNTER — Encounter: Payer: Self-pay | Admitting: Obstetrics & Gynecology

## 2015-11-16 ENCOUNTER — Ambulatory Visit (INDEPENDENT_AMBULATORY_CARE_PROVIDER_SITE_OTHER): Payer: Medicaid Other | Admitting: Obstetrics & Gynecology

## 2015-11-16 VITALS — BP 115/68 | HR 87 | Wt 218.6 lb

## 2015-11-16 DIAGNOSIS — N871 Moderate cervical dysplasia: Secondary | ICD-10-CM

## 2015-11-16 DIAGNOSIS — Z3009 Encounter for other general counseling and advice on contraception: Secondary | ICD-10-CM | POA: Diagnosis not present

## 2015-11-16 MED ORDER — ETONOGESTREL-ETHINYL ESTRADIOL 0.12-0.015 MG/24HR VA RING: VAGINAL_RING | VAGINAL | Status: DC

## 2015-11-16 NOTE — Patient Instructions (Signed)

## 2015-11-16 NOTE — Progress Notes (Signed)
Patient ID: Christie GravesKinyetta D Casimir, female   DOB: [birth year], 30 y.o.   MRN: [id number] History:  30 y.o. [id number] here today for post LEEP f/u.  She denies problems.  She reports being sexually active yesterday for the first time.  She would like to start Nuvaring.  She report no pain or bleeding currently. She denies problems after her LEEP.  The following portions of the patient's history were reviewed and updated as appropriate: allergies, current medications, past family history, past medical history, past social history, past surgical history and problem list.  Review of Systems:  Pertinent items are noted in HPI.  Objective:  Physical Exam Blood pressure 115/68, pulse 87, weight 218 lb 9.6 oz (99.156 kg), last menstrual period 09/26/2015. Gen: NAD Abd: Soft, nontender and nondistended Pelvic: Normal appearing external genitalia; normal appearing vaginal mucosa and cervix.  Normal discharge.  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 - BACKGROUND LOW GRADE SQUAMOUS INTRAEPITHELIAL LESION, CIN-I (MILD DYSPLASIA). - DYSPLASIA INVOLVES ENDOCERVICAL GLANDS. - MARGINS ARE NEGATIVE FOR DYSPLASIA.  Assessment & Plan:  Post LEEP f/u- doing well.  Reviewed surg path F/u in 1 year or sooner prn  Contraception counseling- desires Nuvaring.  No contraindications  Caylan Chenard L. Harraway-Smith, M.D., Evern CoreFACOG

## 2015-12-01 ENCOUNTER — Ambulatory Visit (INDEPENDENT_AMBULATORY_CARE_PROVIDER_SITE_OTHER): Payer: Medicaid Other | Admitting: Family Medicine

## 2015-12-01 ENCOUNTER — Other Ambulatory Visit (HOSPITAL_COMMUNITY)
Admission: RE | Admit: 2015-12-01 | Discharge: 2015-12-01 | Disposition: A | Discharge status: Home / Self Care | Payer: Medicaid Other | Source: Ambulatory Visit | Attending: Family Medicine | Admitting: Family Medicine

## 2015-12-01 ENCOUNTER — Encounter: Payer: Self-pay | Admitting: Family Medicine

## 2015-12-01 VITALS — BP 113/67 | HR 83 | Temp 98.5°F | Wt 212.8 lb

## 2015-12-01 DIAGNOSIS — N912 Amenorrhea, unspecified: Secondary | ICD-10-CM

## 2015-12-01 DIAGNOSIS — Z113 Encounter for screening for infections with a predominantly sexual mode of transmission: Secondary | ICD-10-CM

## 2015-12-01 LAB — POCT WET PREP (WET MOUNT): CLUE CELLS WET PREP WHIFF POC: NEGATIVE

## 2015-12-01 LAB — POCT URINE PREGNANCY: PREG TEST UR: NEGATIVE

## 2015-12-01 NOTE — Progress Notes (Signed)
   Subjective:    Patient ID: Christie [name], female    DOB: [birth year], 30 y.o.   MRN: [id number]  Seen for Same day visit for   CC: std exposure  She comes in today for concern for STDs.  Reports being sexually active with more than 3 partners in the past year with inconsistent condom use.  Reports history of chlamydia.  Denies any fevers, chills, abdominal pain, vaginal discharge, irregular bleeding.   Review of Systems   See HPI for ROS. Objective:  BP 113/67 mmHg  Pulse 83  Temp(Src) 98.5 F (36.9 C) (Oral)  Wt 212 lb 12.8 oz (96.525 kg)  LMP 10/24/2015  General: NAD Speculum Exam: Ext genitalia: wnl; Vaginal discharge: Minimal serosanguineous; Cervix: Nonfriable Bimanual Exam: No Cervical motion tenderness; No Vaginal wall defects; Adnexa nontender   Assessment & Plan:   1. Amenorrhea - POCT urine pregnancy: negative  2. Screening for STD (sexually transmitted disease) - POCT Wet Prep [facility] [place] - Cervicovaginal ancillary only - will notify of results

## 2015-12-02 ENCOUNTER — Telehealth: Payer: Self-pay | Admitting: Family Medicine

## 2015-12-02 LAB — CERVICOVAGINAL ANCILLARY ONLY
CHLAMYDIA, DNA PROBE: NEGATIVE
NEISSERIA GONORRHEA: NEGATIVE

## 2015-12-02 NOTE — Telephone Encounter (Signed)
Called and informed patient that her STD results were all negative

## 2016-01-31 ENCOUNTER — Encounter: Payer: Self-pay | Admitting: Family Medicine

## 2016-01-31 ENCOUNTER — Other Ambulatory Visit (HOSPITAL_COMMUNITY)
Admission: RE | Admit: 2016-01-31 | Discharge: 2016-01-31 | Disposition: A | Discharge status: Home / Self Care | Payer: Medicaid Other | Source: Ambulatory Visit | Attending: Family Medicine | Admitting: Family Medicine

## 2016-01-31 ENCOUNTER — Ambulatory Visit (INDEPENDENT_AMBULATORY_CARE_PROVIDER_SITE_OTHER): Payer: Self-pay | Admitting: Clinical

## 2016-01-31 ENCOUNTER — Ambulatory Visit (INDEPENDENT_AMBULATORY_CARE_PROVIDER_SITE_OTHER): Payer: Medicaid Other | Admitting: Obstetrics and Gynecology

## 2016-01-31 VITALS — BP 116/81 | HR 67 | Wt 214.3 lb

## 2016-01-31 DIAGNOSIS — N939 Abnormal uterine and vaginal bleeding, unspecified: Secondary | ICD-10-CM | POA: Diagnosis present

## 2016-01-31 DIAGNOSIS — Z113 Encounter for screening for infections with a predominantly sexual mode of transmission: Secondary | ICD-10-CM | POA: Diagnosis present

## 2016-01-31 DIAGNOSIS — F4323 Adjustment disorder with mixed anxiety and depressed mood: Secondary | ICD-10-CM

## 2016-01-31 NOTE — Patient Instructions (Signed)

## 2016-01-31 NOTE — Progress Notes (Signed)
  ASSESSMENT: Pt currently experiencing Adjustment disorder with mixed anxious and depressed mood. Pt needs to f/u with OB, and [facility] as needed. Pt would benefit from psychoeducation and brief therapeutic interventions regarding coping with symptoms of anxiety and depression. Stage of Change: contemplative  PLAN: 1. F/U with behavioral health clinician as needed 2. Psychiatric Medications: Zoloft 3. Behavioral recommendations:   -Continue to keep positive outlook, finding the humor in life -Read educational material regarding coping with symptoms of anxiety and depression  SUBJECTIVE: Pt. referred by  Michaele Offer, DOfor symptoms of anxiety and depression  Pt. reports the following symptoms/concerns: Pt states that she is coping with symptoms of anxiety and depression with Zoloft, and that it is working well for her; does not anticipate needing additional strategies to cope at this time. Pt states she is somewhat stressed with recent diagnosis, and attributes additional stress to health, but feels she is managing it well; open to talk further if symptoms increase.  Duration of problem: Less than one week Severity: mild   OBJECTIVE: Orientation & Cognition: Oriented x3. Thought processes normal and appropriate to situation. Mood: appropriate Affect: appropriate Appearance: appropriate Risk of harm to self or others: no known risk of harm to self or others Substance use: none Assessments administered: PHQ9: 10/GAD7: 12  Diagnosis: Adjustment disorder with anxious and depressed mood CPT Code: F43.23  -------------------------------------------- Other(s) present in the room: none    Time spent with patient in exam room: 16 minutes, 2:45-3:01pm Depression screen [facility] 2/9 12/01/2015 11/02/2015 09/02/2015 08/03/2015 01/30/2014  Decreased Interest 0 0 0 3 0  Down, Depressed, Hopeless 1 1 0 3 0  PHQ - 2 Score 1 1 0 6 0  Altered sleeping - - - - -  Tired, decreased energy - - - - -   Change in appetite - - - - -  Feeling bad or failure about yourself  - - - - -  Trouble concentrating - - - - -  Moving slowly or fidgety/restless - - - - -  Suicidal thoughts - - - - -  PHQ-9 Score - - - - -

## 2016-01-31 NOTE — Progress Notes (Signed)
Subjective:     Christie [name] is a 30 y.o. female here for a complaints of heavy vaginal bleeding after intercourse since her last period. She reports after having a LEEP preformed in April she often has spotting after intercourse, but since her last period that started on July 4th she has had heavy bleeding after sex lasting several days. 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    Gynecologic History Patient's last menstrual period was 01/10/2016 (exact date). Contraception: NuvaRing vaginal inserts   Obstetric History OB History  Gravida Para Term Preterm AB Living  3 2 2  0 1 2  SAB TAB Ectopic Multiple Live Births  0 1 0 0      # Outcome Date GA Lbr Len/2nd Weight Sex Delivery Anes PTL Lv  3 TAB 12/27/11 [redacted]w[redacted]d            Birth Comments: no complications  2 Term 09/12/07   9 lb 6 oz (4.252 kg) M Vag-Spont EPI N LIV     Birth Comments: pre eclampsia  1 Term     M Vag-Spont   LIV       The following portions of the patient's history were reviewed and updated as appropriate: past family history, past surgical history and problem list.  Review of Systems Review of Systems  Constitutional: Negative for chills, fever and weight loss.  HENT: Negative for ear pain and hearing loss.   Eyes: Negative for blurred vision, double vision and photophobia.  Respiratory: Negative for cough and hemoptysis.   Cardiovascular: Negative for chest pain, palpitations and orthopnea.  Gastrointestinal: Negative for heartburn, nausea and vomiting.  Genitourinary: Negative for dysuria and urgency.  Musculoskeletal: Negative for myalgias.  Skin: Negative for itching and rash.  Neurological: Negative for dizziness and tingling.     Objective:   Physical Exam  Constitutional: She appears well-developed and well-nourished.  HENT:  Head: Normocephalic and atraumatic.  Eyes: EOM are normal. Pupils are equal, round, and reactive to light.  Cardiovascular: Normal rate,  regular rhythm and normal heart sounds.   Pulmonary/Chest: Effort normal and breath sounds normal. No respiratory distress.  Abdominal: Soft. Bowel sounds are normal.  Genitourinary: Vagina normal and uterus normal.  Genitourinary Comments: Minimal bright red blood in os.    Assessment:  Vaginal Bleeding with intercourse.    Plan:    Pt with vaginal bleeding after sex since her last period. History of vaginal dysplasia. Spotted before but never bleeding this heavy.   Advised that watchful waiting is appropriate as there is no visual abnormality. If persistent after next ful cycle could consider EMB and repeat PAP.

## 2016-02-01 LAB — GC/CHLAMYDIA PROBE AMP (~~LOC~~) NOT AT ARMC
Chlamydia: NEGATIVE
Neisseria Gonorrhea: NEGATIVE

## 2016-02-15 ENCOUNTER — Ambulatory Visit: Payer: Self-pay | Admitting: Family Medicine

## 2016-02-23 ENCOUNTER — Ambulatory Visit: Payer: Self-pay | Admitting: Family Medicine

## 2016-03-09 ENCOUNTER — Ambulatory Visit (INDEPENDENT_AMBULATORY_CARE_PROVIDER_SITE_OTHER): Payer: Medicaid Other | Admitting: Student

## 2016-03-09 ENCOUNTER — Encounter: Payer: Self-pay | Admitting: Student

## 2016-03-09 VITALS — BP 116/78 | HR 93 | Temp 98.3°F | Wt 215.0 lb

## 2016-03-09 DIAGNOSIS — F4321 Adjustment disorder with depressed mood: Secondary | ICD-10-CM

## 2016-03-09 DIAGNOSIS — IMO0002 Reserved for concepts with insufficient information to code with codable children: Secondary | ICD-10-CM

## 2016-03-09 DIAGNOSIS — E108 Type 1 diabetes mellitus with unspecified complications: Secondary | ICD-10-CM

## 2016-03-09 DIAGNOSIS — E1065 Type 1 diabetes mellitus with hyperglycemia: Secondary | ICD-10-CM

## 2016-03-09 LAB — POCT GLYCOSYLATED HEMOGLOBIN (HGB A1C): HEMOGLOBIN A1C: 11.7

## 2016-03-09 MED ORDER — ARIPIPRAZOLE 5 MG PO TABS: 5.0000 mg | ORAL_TABLET | Freq: Every day | ORAL | 2 refills | Status: DC

## 2016-03-09 MED ORDER — SERTRALINE HCL 50 MG PO TABS: 100.0000 mg | ORAL_TABLET | Freq: Every day | ORAL | 3 refills | Status: DC

## 2016-03-09 NOTE — Progress Notes (Signed)
   Subjective:    Patient ID: Christie [name], female    DOB: May 16, 1986, 30 y.o.   MRN: [id number]   CC: Follow-up depression  HPI: 30 year old female presents to follow-up depression  Depression - Previously treated with Zoloft 50 mg but has been taking 100 mg - However she feels this has not helped her so she has not taken any in the last week - Continues to feel down, decreased ability to sleep, eat, interactive with others - Denies SI/HI - PH Q9 21, GAD 7- 17  Smoking status reviewed  Review of Systems  Per HPI, else denies recent illness, fever, headache, changes in vision, chest pain, shortness of breath, abdominal pain, N/V/D, weakness    Objective:  BP 116/78   Pulse 93   Temp 98.3 F (36.8 C) (Oral)   Wt 215 lb (97.5 kg)   LMP 03/08/2016   SpO2 99%   BMI 31.75 kg/m  Vitals and nursing note reviewed  General: NAD Cardiac: RRR Respiratory: CTAB, normal effort Extremities: no edema or cyanosis. WWP. 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 Patient amenable to continue increase Zoloft 100 mg daily. However will start Abilify as an add-on therapy. Patient counseled to start with behavioral health and she was amenable to this - Behavioral Health referral - Follow-up in 2 weeks for depression - Continue to follow closely    Carol Theys A. Kennon RoundsHaney MD, MS Family Medicine Resident PGY-3 Pager [phone]

## 2016-03-09 NOTE — Assessment & Plan Note (Signed)
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 Patient amenable to continue increase Zoloft 100 mg daily. However will start Abilify as an add-on therapy. Patient counseled to start with behavioral health and she was amenable to this - Behavioral Health referral - Follow-up in 2 weeks for depression - Continue to follow closely

## 2016-03-09 NOTE — Patient Instructions (Signed)
Follow-up in 2 weeks for depression check Please make an appointment with behavioral health discuss depression Please take Abilify and Zoloft daily as prescribed If you have questions or concerns call the office at [phone]

## 2016-03-20 ENCOUNTER — Encounter: Payer: Self-pay | Admitting: Student

## 2016-03-22 ENCOUNTER — Other Ambulatory Visit: Payer: Self-pay | Admitting: Family Medicine

## 2016-03-22 DIAGNOSIS — E108 Type 1 diabetes mellitus with unspecified complications: Principal | ICD-10-CM

## 2016-03-22 DIAGNOSIS — IMO0002 Reserved for concepts with insufficient information to code with codable children: Secondary | ICD-10-CM

## 2016-03-22 DIAGNOSIS — E1065 Type 1 diabetes mellitus with hyperglycemia: Secondary | ICD-10-CM

## 2016-04-27 ENCOUNTER — Encounter: Payer: Self-pay | Admitting: Student

## 2016-04-27 ENCOUNTER — Ambulatory Visit (INDEPENDENT_AMBULATORY_CARE_PROVIDER_SITE_OTHER): Payer: Medicaid Other | Admitting: Student

## 2016-04-27 VITALS — BP 115/65 | HR 65 | Temp 97.5°F | Wt 212.0 lb

## 2016-04-27 DIAGNOSIS — E108 Type 1 diabetes mellitus with unspecified complications: Secondary | ICD-10-CM

## 2016-04-27 DIAGNOSIS — E1065 Type 1 diabetes mellitus with hyperglycemia: Secondary | ICD-10-CM | POA: Diagnosis not present

## 2016-04-27 DIAGNOSIS — IMO0002 Reserved for concepts with insufficient information to code with codable children: Secondary | ICD-10-CM

## 2016-04-27 DIAGNOSIS — M549 Dorsalgia, unspecified: Secondary | ICD-10-CM | POA: Insufficient documentation

## 2016-04-27 DIAGNOSIS — M545 Low back pain: Secondary | ICD-10-CM

## 2016-04-27 LAB — POCT URINALYSIS DIPSTICK
BILIRUBIN UA: NEGATIVE
GLUCOSE UA: 500
Ketones, UA: NEGATIVE
Leukocytes, UA: NEGATIVE
NITRITE UA: NEGATIVE
Protein, UA: NEGATIVE
RBC UA: NEGATIVE
Spec Grav, UA: <=1.005
Urobilinogen, UA: 0.2
pH, UA: 6

## 2016-04-27 NOTE — Assessment & Plan Note (Signed)
Left sided back pain, mild chronic abdominal. UA negative for evidence of infection. Given history of sexual contact with two female partners, there is concern for STD, but she declined testing today as she as tested in 01/2016 and has not changed partners since that time. She is aware that they may have had other partners. Differential included muscle ache, renal pathology, shingles. Most likely due to muscle pathology, however if pain continues she will return for re-evaluation - will use OTC pain relievers at this time - return precautions reviewed

## 2016-04-27 NOTE — Patient Instructions (Signed)
Follow-up with PCP for diabetes Please try to eat healthfully.Obtain twice as many veg's as protein or carbohydrate foods for both lunch and dinner. Please follow with ophthalmology for eye exam.  You may take Tylenol or Motrin as needed for pain. You likely pulled a muscle in her back  If you develop dysuria, fevers call the office right away for evaluation. Go to the emergency department If you have questions or concerns call the office at [phone]

## 2016-04-27 NOTE — Progress Notes (Signed)
   Subjective:    Patient ID: Octavio GravesKinyetta D Ishii, female    DOB: 08/24/1985, 30 y.o.   MRN: [id number]   CC:  HPI:   Smoking status reviewed  Review of Systems  Per HPI, else denies recent illness, fever, headache, changes in vision, chest pain, shortness of breath, abdominal pain, N/V/D, weakness    Objective:  BP 115/65   Pulse 65   Temp 97.5 F (36.4 C) (Oral)   Wt 212 lb (96.2 kg)   LMP 04/18/2016   SpO2 100%   BMI 31.31 kg/m  Vitals and nursing note reviewed  General: NAD Cardiac: RRR, normal heart sounds, no murmurs. 2+ radial and PT pulses bilaterally Respiratory: CTAB, normal effort Abdomen: soft, nontender, nondistended, no hepatic or splenomegaly. Bowel sounds present Extremities: no edema or cyanosis. WWP. Skin: warm and dry, no rashes noted Neuro: alert and oriented, no focal deficits   Assessment & Plan:    No problem-specific Assessment & Plan notes found for this encounter.    Laura-Lee Villegas A. Kennon RoundsHaney MD, MS Family Medicine Resident PGY-3 Pager [phone]

## 2016-04-27 NOTE — Assessment & Plan Note (Signed)
UA with 500 glucose. Discussed need to eat healthfully in the setting of T1DM - has a appt for ophtho - Dr Gerilyn PilgrimSykes card given to her for nutrition counseling - will follow with PCP for adjustment of meds

## 2016-04-27 NOTE — Progress Notes (Addendum)
   Subjective:    Patient ID: Christie [name], female    DOB: 04/21/1986, [age] y.Octavio Graveso.   MRN: [id number]   CC: Left-sided back pain  HPI: 30 year old female with type 1 diabetes presents for muscle or back pain and is concerned she has urinary tract infection  Back pain - awoke this morning with left-sided back pain - denies rash - Denies trauma to the area  - She denies fevers, dysuria, vaginal irritation, discharge  - She has chronic abdominal pain which has not changed  - Of note she is sexually active with 2 female partners. Occasionally uses condoms. She has not had a change in her sexual partner since her last STD check and does not desire a repeat STD testing.   T1DM -Blood sugars are in the 200s on CBG - admits to unhealthy eating - drank a frapuccino prior to arriving to the ED - takes latus and Novolog as prescibed  Smoking status reviewed  Review of Systems  Per HPI, else denies recent illness, fever, headache, changes in vision, chest pain, shortness of breath, abdominal pain, N/V/D, weakness    Objective:  BP 115/65   Pulse 65   Temp 97.5 F (36.4 C) (Oral)   Wt 212 lb (96.2 kg)   LMP 04/18/2016   SpO2 100%   BMI 31.31 kg/m  Vitals and nursing note reviewed  General: NAD Cardiac: RRR, Respiratory: CTAB, normal effort Abdomen: mild tenderness of periumbilical region. MSK: mild tenderness to palpation over left paraspinous muscle Skin: warm and dry, no rashes noted Neuro: alert and oriented,   Assessment & Plan:    Back pain Left sided back pain, mild chronic abdominal. UA negative for evidence of infection. Given history of sexual contact with two female partners, there is concern for STD, but she declined testing today as she as tested in 01/2016 and has not changed partners since that time. She is aware that they may have had other partners. Differential included muscle ache, renal pathology, shingles. Most likely due to muscle pathology, however if  pain continues she will return for re-evaluation - will use OTC pain relievers at this time - return precautions reviewed  Type I diabetes mellitus with complication, uncontrolled (HCC) UA with 500 glucose. Discussed need to eat healthfully in the setting of T1DM - has a appt for ophtho - Dr Gerilyn PilgrimSykes card given to her for nutrition counseling - will follow with PCP for adjustment of meds    Ahmaya Ostermiller A. Kennon RoundsHaney MD, MS Family Medicine Resident PGY-3 Pager [phone]

## 2016-05-05 IMAGING — CT CT NECK W/ CM
4 series · 14 of 33 positions shown, 17 images · IV contrast (omnipaque)
Comparison: None.

CLINICAL DATA: Neck pain.  Painful swallowing.

EXAM:
CT NECK WITH CONTRAST
TECHNIQUE: Multidetector CT imaging of the neck was performed using the
standard protocol following the bolus administration of intravenous
contrast.
CONTRAST:  75mL OMNIPAQUE IOHEXOL 300 MG/ML  SOLN

[Series 2: neck 2.0 b31s · axial · 0.54mm/px · 1 of 106 slices shown]
[im 18/106  bone]
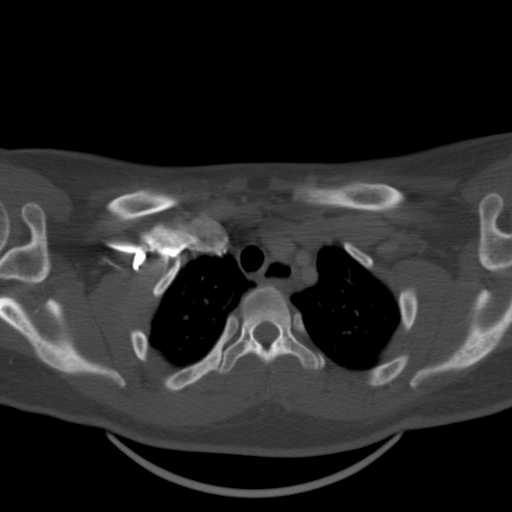

[Series 4: neck 2.0 coronal · coronal · 0.43mm/px · 3 of 126 slices shown]
[im 26/126  bone]
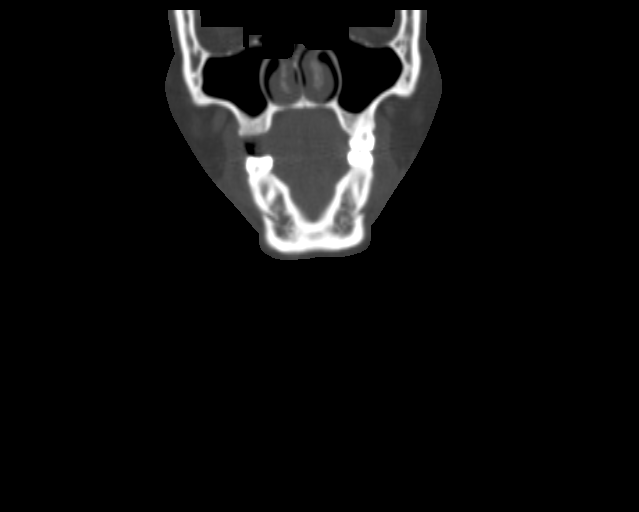
[im 51/126  bone]
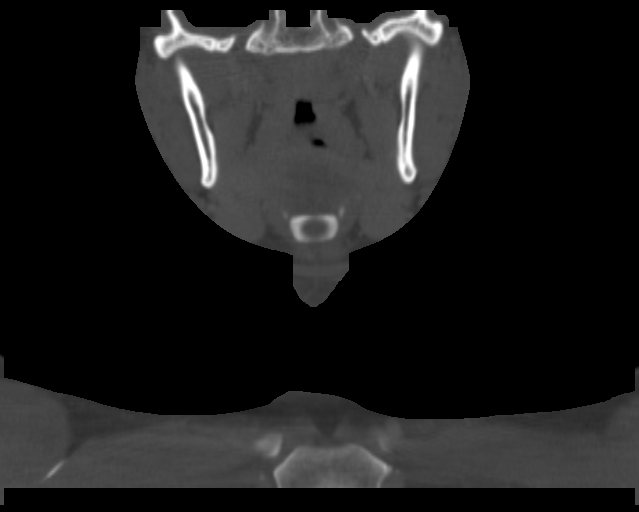
[im 76/126  bone]
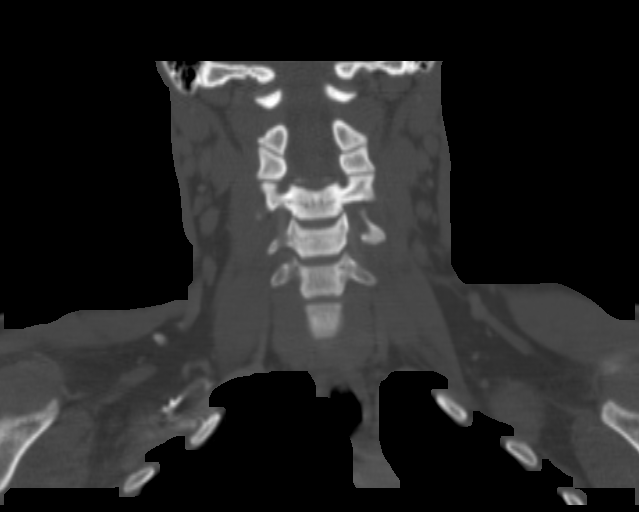

[Series 5: neck 2.0 sagittal · sagittal · 0.43mm/px · 5 of 122 slices shown, 6 images]
[im 41/122  bone]
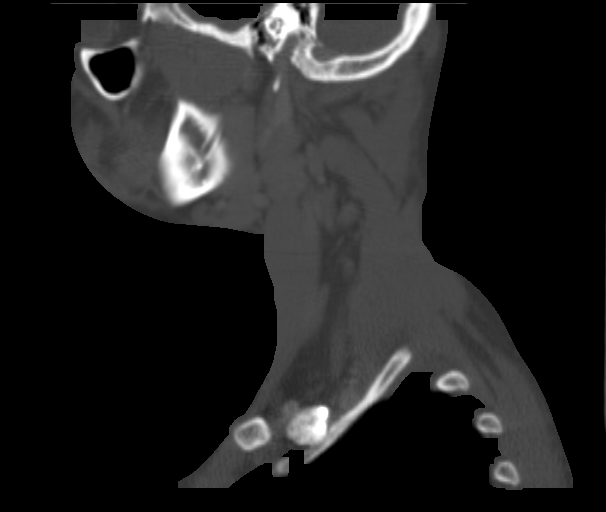
[im 51/122  bone]
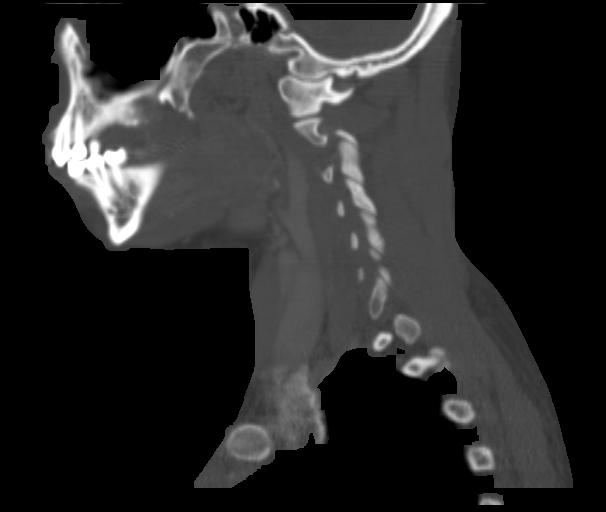
[im 61/122  soft-tissue]
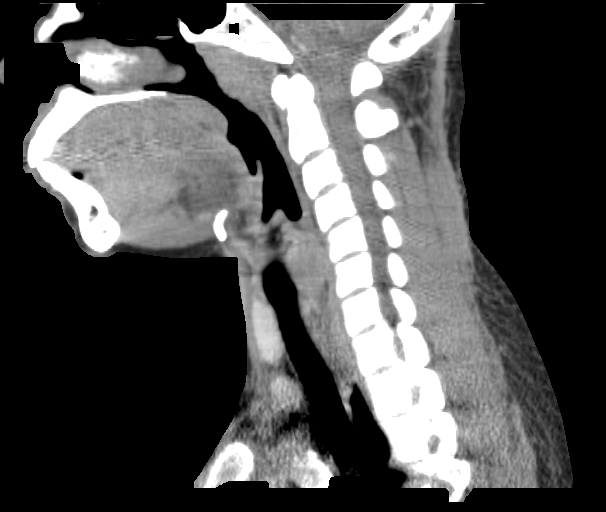
[im 61/122  bone]
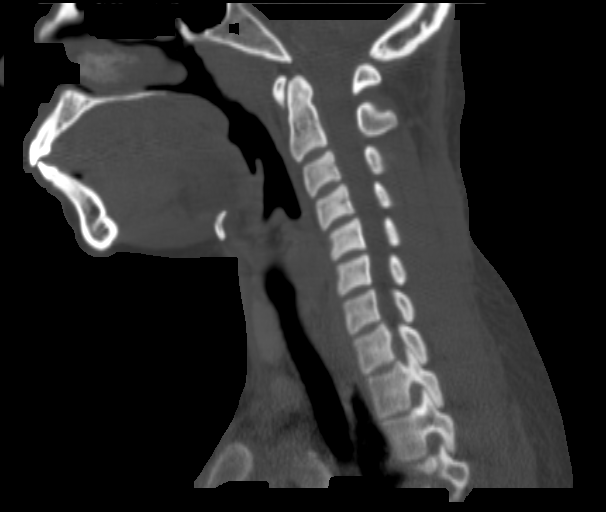
[im 71/122  bone]
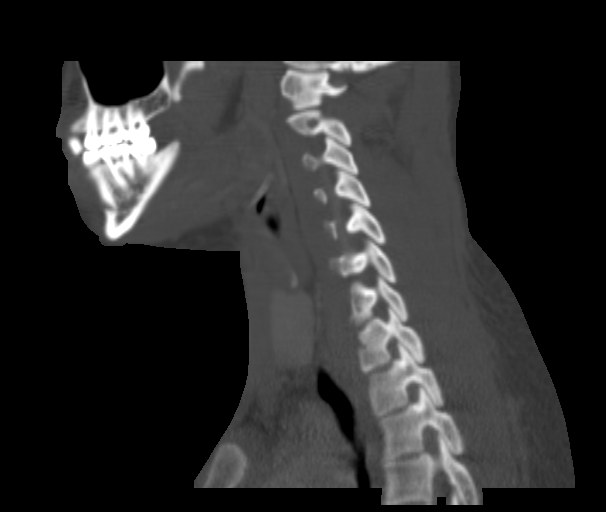
[im 81/122  bone]
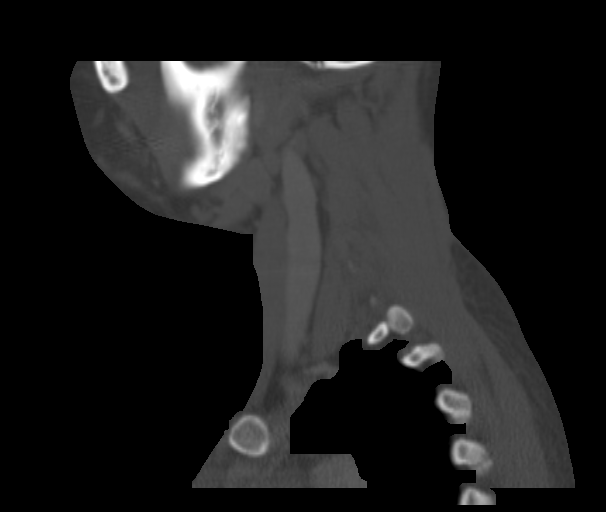

[Series 7: neck 2.0 orth axial to hyoid · axial · 0.45mm/px · z∈[+986,+1142]mm · 5 of 119 slices shown, 7 images]
[im 20/119  soft-tissue]
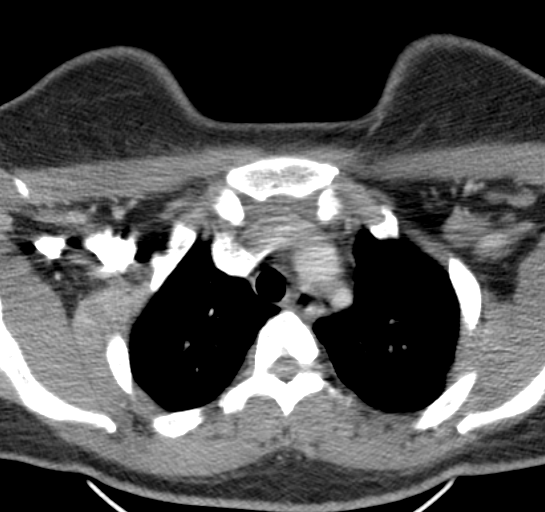
[im 20/119  bone]
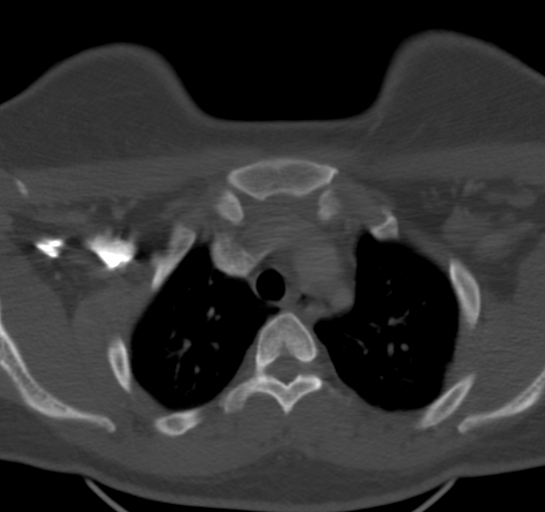
[im 40/119  bone]
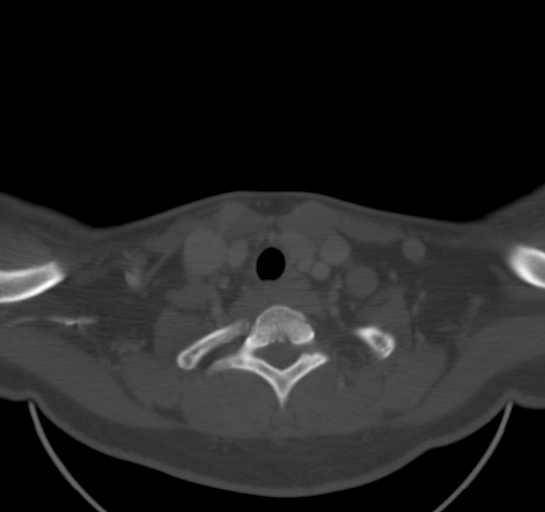
[im 60/119  bone]
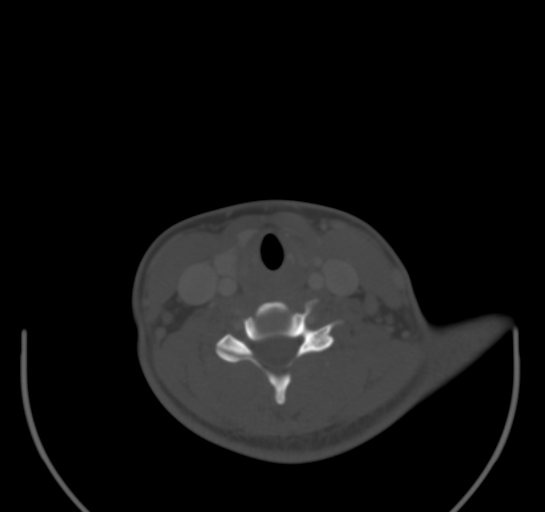
[im 79/119  bone]
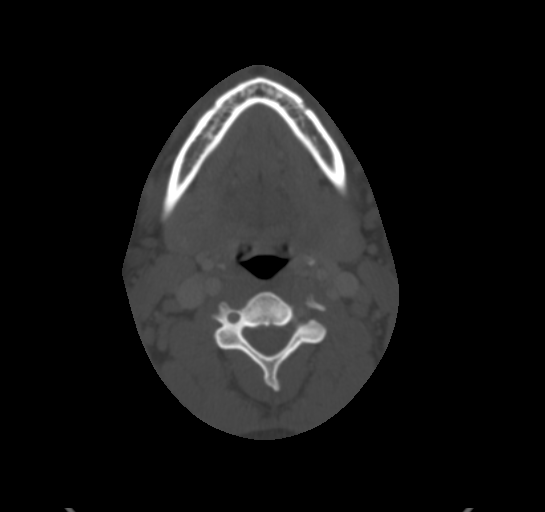
[im 99/119  soft-tissue]
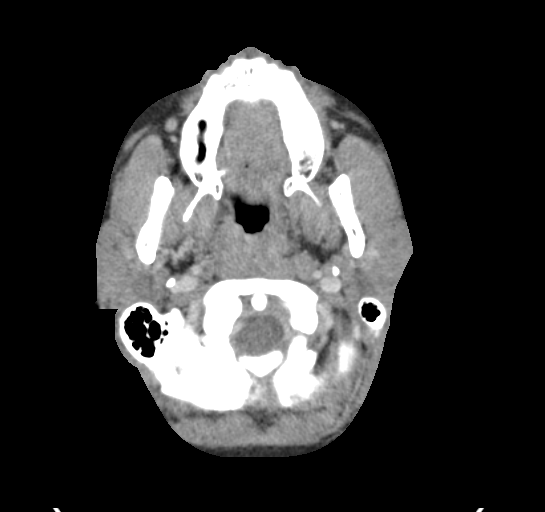
[im 99/119  bone]
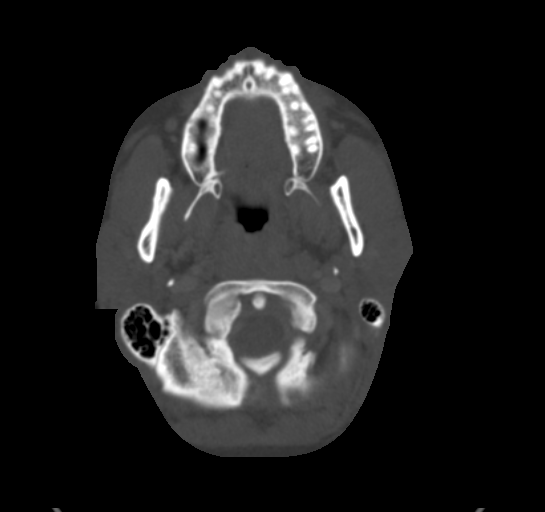

[14 of 33 positions shown; findings below may reference images not displayed]

FINDINGS: Mild enlargement of the adenoids bilaterally. Mild symmetric
enlargement of the tonsils without evidence of acute inflammation or
abscess. No mass lesion.

Parotid and submandibular glands are normal. Thyroid is normal.
Larynx is normal.

Shoddy adenopathy in the neck. Largest node is a right level 2 node
measuring 13 mm. Multiple subcentimeter posterior lymph nodes
bilaterally. Left level 2 lymph node 8 mm. Lung apices are clear.
IMPRESSION: Mild enlargement of the tonsils and adenoids bilaterally without
abscess. Shoddy adenopathy in the neck. Findings are most consistent
with pharyngitis.

## 2016-06-13 ENCOUNTER — Other Ambulatory Visit: Payer: Self-pay | Admitting: Family Medicine

## 2016-06-13 DIAGNOSIS — E1065 Type 1 diabetes mellitus with hyperglycemia: Secondary | ICD-10-CM

## 2016-06-13 DIAGNOSIS — IMO0002 Reserved for concepts with insufficient information to code with codable children: Secondary | ICD-10-CM

## 2016-06-13 DIAGNOSIS — E108 Type 1 diabetes mellitus with unspecified complications: Principal | ICD-10-CM

## 2016-07-13 NOTE — Telephone Encounter (Signed)
This encounter was created in error - please disregard.

## 2016-08-17 ENCOUNTER — Other Ambulatory Visit: Payer: Self-pay | Admitting: Family Medicine

## 2016-08-17 DIAGNOSIS — E108 Type 1 diabetes mellitus with unspecified complications: Principal | ICD-10-CM

## 2016-08-17 DIAGNOSIS — E1065 Type 1 diabetes mellitus with hyperglycemia: Secondary | ICD-10-CM

## 2016-08-17 DIAGNOSIS — IMO0002 Reserved for concepts with insufficient information to code with codable children: Secondary | ICD-10-CM

## 2016-08-17 MED ORDER — INSULIN ASPART 100 UNIT/ML ~~LOC~~ SOLN: 25.0000 [IU] | Freq: Three times a day (TID) | SUBCUTANEOUS | 1 refills | Status: DC

## 2016-08-17 MED ORDER — INSULIN GLARGINE 100 UNIT/ML ~~LOC~~ SOLN: 35.0000 [IU] | Freq: Every day | SUBCUTANEOUS | 1 refills | Status: DC

## 2016-08-17 NOTE — Telephone Encounter (Signed)
Pt needs refills on Lantus and Novolog. Pt is out of both. Pt uses [facility] [employer]. ep

## 2017-05-28 ENCOUNTER — Encounter: Payer: Medicaid Other | Attending: Internal Medicine | Admitting: *Deleted

## 2017-05-28 DIAGNOSIS — E108 Type 1 diabetes mellitus with unspecified complications: Secondary | ICD-10-CM

## 2017-05-28 DIAGNOSIS — E1065 Type 1 diabetes mellitus with hyperglycemia: Secondary | ICD-10-CM | POA: Insufficient documentation

## 2017-05-28 DIAGNOSIS — IMO0002 Reserved for concepts with insufficient information to code with codable children: Secondary | ICD-10-CM

## 2017-06-04 NOTE — Progress Notes (Signed)
Insulin Pump and / or CGM Evaluation Visit:  Date: 05/28/2017   Appt start time: 1400 end time:  1530.  Assessment:  This patient has DM 1 and their primary concerns today: to learn about insulin pumps.  Usual physical activity: some walking on campus Patient currently is working  NO and the schedule is she is going back to college to [employer] Patient states knowledge of Carb Counting is 8 on a scale of 1-10 Patient states they are currently testing BG 1-2 times per day Last A1c was 11%   Current barriers to managing their diabetes: Patient states:  Too many things to do all the time (Diabetes care is complicated)  MEDICATIONS: Basal Insulin: 50 units of Lantus at night  Bolus Insulin: Various units of Novolog at each meal based on 17 plus correction of 1/15 mg/dl above target of 161100 mg/dl  Total Daily Dose of insulin per day 80 Other diabetes medications: no  Progress Towards Obtaining an Insulin Pump Goal(s):   Patient states their expectations of pump therapy include: better control and quality of life Patient expresses understanding that for improved outcomes for their diabetes on an insulin pump they will:  During the first 1-2 weeks (or more) of new pump use, the patient will test BG pre and 2 hour post each meal, bedtime and 3 AM to assist the provider in evaluating accuracy of pump settings and to prevent DKA   Will test BG at least 4 times per day once evaluation time is completed  Change out pump infusion set at least every 3 days  Upload pump information to their pump's Web Based Program on a regular basis so provider can assess patterns and make setting adjustments.     Intervention:    Taught difference between delivery of insulin via syringe/pen compared to insulin pump.  Demonstrated improved insulin delivery via pump due to improved accuracy of dose and flexibility of adjusting bolus insulin based on carb intake and BG  correction.  Showed patient the following pumps: Medtronic, OmniPod, Tandem  Showed patient the following CGM: Medtronic, Dexcom  Demonstrated pump, insulin reservoir and infusion set options, and button pushing for bolus delivery of insulin through the pump  Explained importance of testing BG at least 4 times per day for appropriate correction of high BG and prevention of DKA as applicable.  Emphasized importance of follow up after Pump Start for appropriate pump setting adjustments and on-going training on more advanced features.  Reviewed Carb Counting by [employer]  Discussed concept of Insulin to Carb Ratio and provided examples based on current food and insulin doses.  Handouts given during visit include:  Introduction to Pump Therapy Handout  DM 1/Pump Support Group Flyer  Carb Counting handout   Monitoring/Evaluation:    Patient does want to continue with pursuit of either Medtronic or Omni Pod insulin pump.  Patient asked me to contact local Pump Company Rep so they can start the process of obtaining the pump.   Once pump / CGM is shipped, patient to call this office to set up training.

## 2017-09-23 ENCOUNTER — Other Ambulatory Visit: Payer: Self-pay | Admitting: Advanced Practice Midwife

## 2017-09-23 ENCOUNTER — Other Ambulatory Visit (HOSPITAL_COMMUNITY)
Admission: RE | Admit: 2017-09-23 | Discharge: 2017-09-23 | Disposition: A | Discharge status: Home / Self Care | Payer: Medicaid Other | Source: Ambulatory Visit | Attending: Advanced Practice Midwife | Admitting: Advanced Practice Midwife

## 2017-09-23 ENCOUNTER — Encounter: Payer: Self-pay | Admitting: Advanced Practice Midwife

## 2017-09-23 ENCOUNTER — Ambulatory Visit (INDEPENDENT_AMBULATORY_CARE_PROVIDER_SITE_OTHER): Payer: Medicaid Other | Admitting: Advanced Practice Midwife

## 2017-09-23 VITALS — BP 123/77 | HR 83 | Wt 222.4 lb

## 2017-09-23 DIAGNOSIS — Z01419 Encounter for gynecological examination (general) (routine) without abnormal findings: Secondary | ICD-10-CM

## 2017-09-23 DIAGNOSIS — Z9889 Other specified postprocedural states: Secondary | ICD-10-CM | POA: Insufficient documentation

## 2017-09-23 DIAGNOSIS — Z3169 Encounter for other general counseling and advice on procreation: Secondary | ICD-10-CM

## 2017-09-23 DIAGNOSIS — Z0000 Encounter for general adult medical examination without abnormal findings: Secondary | ICD-10-CM | POA: Diagnosis not present

## 2017-09-23 NOTE — Progress Notes (Signed)
Subjective:     Patient ID: Christie [name] Trauger, female   DOB: 1986-06-24, 32 y.o.   MRN: [id number]  Christie [name] Fessenden is a 32 y.o. [id number] who [facility]. She has also recently started with an insulin pump, and reports that her blood sugars have been much better. She is also desiring a pregnancy at this time, and wants to make sure "everything is ok for that". She has not had a pap since her LEEP in 2017. She denies any concerns. She thinks she may have a yeast infection as she has had irritation. She reports that her period is about 5 days late. She had a negative UPT at home. She is wondering about a blood hcg today.    Vaginal Discharge  The patient's primary symptoms include vaginal discharge. This is a new problem. The current episode started in the past 7 days. The problem occurs intermittently. The problem has been unchanged. The patient is experiencing no pain. She is not pregnant. The vaginal discharge was white. There has been no bleeding. Nothing aggravates the symptoms. She has tried nothing for the symptoms. She is sexually active. She uses nothing for contraception. Her menstrual history has been regular. Her past medical history is significant for a gynecological surgery.     Review of Systems  Genitourinary: Positive for vaginal discharge.  All other systems reviewed and are negative.      Objective:   Physical Exam  Constitutional: She is oriented to person, place, and time. She appears well-developed and well-nourished. No distress.  HENT:  Head: Normocephalic.  Cardiovascular: Normal rate.  Pulmonary/Chest: Effort normal.  Abdominal: Soft. There is no tenderness.  Genitourinary:  Genitourinary Comments:  External: no lesion Vagina: small amount of white discharge Cervix: pink, smooth, no CMT Uterus: NSSC Adnexa: NT   Neurological: She is alert and oriented to person, place, and time.  Skin: Skin is warm and dry.  Psychiatric: She has a normal  mood and affect.  Nursing note and vitals reviewed.      Assessment:     1. Well woman exam with routine gynecological exam   2. History of loop electrical excision procedure (LEEP)   3. Encounter for preconception consultation        Plan:     BHCG today Pap/wet prep/GC/CT Encouraged patient to continue with good glucose control as that is the best thing for a future pregnancy.  General preconception counseling today  FU PRN Will send mychart message with results.

## 2017-09-23 NOTE — Progress Notes (Signed)
Hx LEEP and desires to get pregnant. Wants to try to get pregnant and has not been seen in awhile. Just started insulin pump 2 wks ago and now has vag d/c. THink s has yeast infection. Whenever insulin dose changes seems to get yeast infection.

## 2017-09-25 LAB — CYTOLOGY - PAP: Diagnosis: NEGATIVE

## 2017-09-25 LAB — CERVICOVAGINAL ANCILLARY ONLY
BACTERIAL VAGINITIS: NEGATIVE
CANDIDA VAGINITIS: NEGATIVE
Chlamydia: NEGATIVE
Neisseria Gonorrhea: NEGATIVE
Trichomonas: NEGATIVE

## 2017-09-26 LAB — BETA HCG QUANT (REF LAB)

## 2017-09-27 ENCOUNTER — Other Ambulatory Visit: Payer: Medicaid Other

## 2017-09-27 DIAGNOSIS — Z01419 Encounter for gynecological examination (general) (routine) without abnormal findings: Secondary | ICD-10-CM

## 2018-01-21 ENCOUNTER — Ambulatory Visit (INDEPENDENT_AMBULATORY_CARE_PROVIDER_SITE_OTHER): Payer: Medicaid Other

## 2018-01-21 DIAGNOSIS — Z3201 Encounter for pregnancy test, result positive: Secondary | ICD-10-CM

## 2018-01-21 NOTE — Progress Notes (Signed)
Pt came in for UPT-Positive, took 4 pregnancy test at home, all where positive.LMP:12/04/17, EDD: 09/10/18,GA: 6620w6d. Reviewed Meds,Pt currently has  A DIABETIC PUMP and generally gets pregnancy induced Hypertension. Pt states would like care here, advised at check out see if any openings avail. If not will direct elsewhere. Pt states has already started Prenatal vitamins

## 2018-01-21 NOTE — Progress Notes (Signed)
I have reviewed the chart and agree with nursing staff's documentation of this patient's encounter.  Caydn Justen, CNM 01/21/2018 5:54 PM    

## 2018-01-22 LAB — POCT PREGNANCY, URINE: PREG TEST UR: POSITIVE — AB

## 2018-02-11 ENCOUNTER — Ambulatory Visit (INDEPENDENT_AMBULATORY_CARE_PROVIDER_SITE_OTHER): Payer: Medicaid Other | Admitting: Obstetrics & Gynecology

## 2018-02-11 ENCOUNTER — Ambulatory Visit (INDEPENDENT_AMBULATORY_CARE_PROVIDER_SITE_OTHER): Payer: Medicaid Other | Admitting: Clinical

## 2018-02-11 ENCOUNTER — Encounter: Payer: Self-pay | Admitting: Obstetrics & Gynecology

## 2018-02-11 VITALS — BP 117/77 | HR 90 | Wt 232.5 lb

## 2018-02-11 DIAGNOSIS — F4323 Adjustment disorder with mixed anxiety and depressed mood: Secondary | ICD-10-CM | POA: Diagnosis not present

## 2018-02-11 DIAGNOSIS — O219 Vomiting of pregnancy, unspecified: Secondary | ICD-10-CM

## 2018-02-11 DIAGNOSIS — Z8669 Personal history of other diseases of the nervous system and sense organs: Secondary | ICD-10-CM

## 2018-02-11 DIAGNOSIS — O99341 Other mental disorders complicating pregnancy, first trimester: Secondary | ICD-10-CM

## 2018-02-11 DIAGNOSIS — Z9889 Other specified postprocedural states: Secondary | ICD-10-CM

## 2018-02-11 DIAGNOSIS — Z8759 Personal history of other complications of pregnancy, childbirth and the puerperium: Secondary | ICD-10-CM

## 2018-02-11 DIAGNOSIS — O24311 Unspecified pre-existing diabetes mellitus in pregnancy, first trimester: Secondary | ICD-10-CM | POA: Diagnosis not present

## 2018-02-11 DIAGNOSIS — O0991 Supervision of high risk pregnancy, unspecified, first trimester: Secondary | ICD-10-CM

## 2018-02-11 DIAGNOSIS — F329 Major depressive disorder, single episode, unspecified: Secondary | ICD-10-CM

## 2018-02-11 DIAGNOSIS — O0990 Supervision of high risk pregnancy, unspecified, unspecified trimester: Secondary | ICD-10-CM | POA: Insufficient documentation

## 2018-02-11 DIAGNOSIS — O24319 Unspecified pre-existing diabetes mellitus in pregnancy, unspecified trimester: Secondary | ICD-10-CM | POA: Insufficient documentation

## 2018-02-11 LAB — POCT URINALYSIS DIP (DEVICE)
Glucose, UA: 100 mg/dL — AB
HGB URINE DIPSTICK: NEGATIVE
KETONES UR: 15 mg/dL — AB
LEUKOCYTES UA: NEGATIVE
NITRITE: NEGATIVE
Protein, ur: 30 mg/dL — AB
Specific Gravity, Urine: >=1.03 (ref 1.005–1.030)
Urobilinogen, UA: 1 mg/dL (ref 0.0–1.0)
pH: 5.5 (ref 5.0–8.0)

## 2018-02-11 MED ORDER — PROMETHAZINE HCL 25 MG PO TABS: 25.0000 mg | ORAL_TABLET | Freq: Four times a day (QID) | ORAL | 2 refills | Status: DC | PRN

## 2018-02-11 MED ORDER — BUTALBITAL-APAP-CAFFEINE 50-325-40 MG PO CAPS: 1.0000 | ORAL_CAPSULE | Freq: Four times a day (QID) | ORAL | 3 refills | Status: DC | PRN

## 2018-02-11 MED ORDER — ASPIRIN EC 81 MG PO TBEC: 81.0000 mg | DELAYED_RELEASE_TABLET | Freq: Every day | ORAL | 2 refills | Status: DC

## 2018-02-11 MED ORDER — SERTRALINE HCL 100 MG PO TABS: 100.0000 mg | ORAL_TABLET | Freq: Every day | ORAL | 4 refills | Status: DC

## 2018-02-11 MED ORDER — METOCLOPRAMIDE HCL 10 MG PO TABS: 10.0000 mg | ORAL_TABLET | Freq: Four times a day (QID) | ORAL | 2 refills | Status: DC | PRN

## 2018-02-11 NOTE — BH Specialist Note (Signed)
Integrated Behavioral Health Initial Visit  MRN: [id number] Name: Christie [name]  Number of Integrated Behavioral Health Clinician visits:: 1/6 Session Start time: 12:10  Session End time: 12:47 Total time: 35 minutes  Type of Service: Integrated Behavioral Health- Individual/Family Interpretor:No. Interpretor Name and Language: n/a   Warm Hand Off Completed.       SUBJECTIVE: Christie [name] is a 32 y.o. female accompanied by 5yo son and Partner/Significant Other Patient was referred by Jaynie CollinsUgonna Anyanwu, MD for symptoms of  Anxiety and depression. Patient reports the following symptoms/concerns: Pt states her primary concern today is irritability and fatigue, attributed to "severe morning sickness" over one month. FOB is interested in understanding what pt is going through, so he can best support her, and pt wants to learn how to control her anxious feelings, so as not to take it out verbally on FOB.  Duration of problem: Current pregnancy; Severity of problem: moderate  OBJECTIVE: Mood: Anxious and Affect: Appropriate Risk of harm to self or others: No plan to harm self or others  LIFE CONTEXT: Family and Social: Pt lives with FOB and children (10yo; 5yo) School/Work: - Self-Care: Pt recognizing a greater need for self-care Life Changes: Current pregnancy  GOALS ADDRESSED: Patient will: 1. Reduce symptoms of: agitation, anxiety and depression 2. Increase knowledge and/or ability of: self-management skills  3. Demonstrate ability to: Increase healthy adjustment to current life circumstances  INTERVENTIONS: Interventions utilized: Mindfulness or Management consultantelaxation Training and Psychoeducation and/or Health Education  Standardized Assessments completed: GAD-7 and PHQ 9  ASSESSMENT: Patient currently experiencing Adjustment disorder with mixed anxious and depressed mood and History of migraine during pregnancy   Patient may benefit from psychoeducation and brief therapeutic  interventions regarding coping with symptoms of anxiety and depression .  PLAN: 1. Follow up with behavioral health clinician on : One month, at next medical visit 2. Behavioral recommendations:  -CALM relaxation breathing exercise twice daily (first hour upon waking; bedtime); use sleep app sounds alongside breathing -Read educational materials regarding coping with symptoms of anxiety and depression  3. Referral(s): Integrated Behavioral Health Services (In Clinic) 4. "From scale of 1-10, how likely are you to follow plan?": 9  Rae LipsJamie C Shakesha Soltau, LCSW   Depression screen [facility] 2/9 02/11/2018 05/28/2017 04/27/2016 12/01/2015 11/02/2015  Decreased Interest 3 0 0 0 0  Down, Depressed, Hopeless 3 1 0 1 1  PHQ - 2 Score 6 1 0 1 1  Altered sleeping 3 - - - -  Tired, decreased energy 2 - - - -  Change in appetite 2 - - - -  Feeling bad or failure about yourself  2 - - - -  Trouble concentrating 0 - - - -  Moving slowly or fidgety/restless 0 - - - -  Suicidal thoughts 0 - - - -  PHQ-9 Score 15 - - - -   GAD 7 : Generalized Anxiety Score 02/11/2018  Nervous, Anxious, on Edge 3  Control/stop worrying 3  Worry too much - different things 3  Trouble relaxing 3  Restless 1  Easily annoyed or irritable 3  Afraid - awful might happen 2  Total GAD 7 Score 18

## 2018-02-11 NOTE — Patient Instructions (Signed)
Return to clinic for any scheduled appointments or obstetric concerns, or go to MAU for evaluation   First Trimester of Pregnancy The first trimester of pregnancy is from week 1 until the end of week 13 (months 1 through 3). During this time, your baby will begin to develop inside you. At 6-8 weeks, the eyes and face are formed, and the heartbeat can be seen on ultrasound. At the end of 12 weeks, all the baby's organs are formed. Prenatal care is all the medical care you receive before the birth of your baby. Make sure you get good prenatal care and follow all of your doctor's instructions. Follow these instructions at home: Medicines  Take over-the-counter and prescription medicines only as told by your doctor. Some medicines are safe and some medicines are not safe during pregnancy.  Take a prenatal vitamin that contains at least 600 micrograms (mcg) of folic acid.  If you have trouble pooping (constipation), take medicine that will make your stool soft (stool softener) if your doctor approves. Eating and drinking  Eat regular, healthy meals.  Your doctor will tell you the amount of weight gain that is right for you.  Avoid raw meat and uncooked cheese.  If you feel sick to your stomach (nauseous) or throw up (vomit): ? Eat 4 or 5 small meals a day instead of 3 large meals. ? Try eating a few soda crackers. ? Drink liquids between meals instead of during meals.  To prevent constipation: ? Eat foods that are high in fiber, like fresh fruits and vegetables, whole grains, and beans. ? Drink enough fluids to keep your pee (urine) clear or pale yellow. Activity  Exercise only as told by your doctor. Stop exercising if you have cramps or pain in your lower belly (abdomen) or low back.  Do not exercise if it is too hot, too humid, or if you are in a place of great height (high altitude).  Try to avoid standing for long periods of time. Move your legs often if you must stand in one place  for a long time.  Avoid heavy lifting.  Wear low-heeled shoes. Sit and stand up straight.  You can have sex unless your doctor tells you not to. Relieving pain and discomfort  Wear a good support bra if your breasts are sore.  Take warm water baths (sitz baths) to soothe pain or discomfort caused by hemorrhoids. Use hemorrhoid cream if your doctor says it is okay.  Rest with your legs raised if you have leg cramps or low back pain.  If you have puffy, bulging veins (varicose veins) in your legs: ? Wear support hose or compression stockings as told by your doctor. ? Raise (elevate) your feet for 15 minutes, 3-4 times a day. ? Limit salt in your food. Prenatal care  Schedule your prenatal visits by the twelfth week of pregnancy.  Write down your questions. Take them to your prenatal visits.  Keep all your prenatal visits as told by your doctor. This is important. Safety  Wear your seat belt at all times when driving.  Make a list of emergency phone numbers. The list should include numbers for family, friends, the hospital, and police and fire departments. General instructions  Ask your doctor for a referral to a local prenatal class. Begin classes no later than at the start of month 6 of your pregnancy.  Ask for help if you need counseling or if you need help with nutrition. Your doctor can give you   advice or tell you where to go for help.  Do not use hot tubs, steam rooms, or saunas.  Do not douche or use tampons or scented sanitary pads.  Do not cross your legs for long periods of time.  Avoid all herbs and alcohol. Avoid drugs that are not approved by your doctor.  Do not use any tobacco products, including cigarettes, chewing tobacco, and electronic cigarettes. If you need help quitting, ask your doctor. You may get counseling or other support to help you quit.  Avoid cat litter boxes and soil used by cats. These carry germs that can cause birth defects in the baby  and can cause a loss of your baby (miscarriage) or stillbirth.  Visit your dentist. At home, brush your teeth with a soft toothbrush. Be gentle when you floss. Contact a doctor if:  You are dizzy.  You have mild cramps or pressure in your lower belly.  You have a nagging pain in your belly area.  You continue to feel sick to your stomach, you throw up, or you have watery poop (diarrhea).  You have a bad smelling fluid coming from your vagina.  You have pain when you pee (urinate).  You have increased puffiness (swelling) in your face, hands, legs, or ankles. Get help right away if:  You have a fever.  You are leaking fluid from your vagina.  You have spotting or bleeding from your vagina.  You have very bad belly cramping or pain.  You gain or lose weight rapidly.  You throw up blood. It may look like coffee grounds.  You are around people who have German measles, fifth disease, or chickenpox.  You have a very bad headache.  You have shortness of breath.  You have any kind of trauma, such as from a fall or a car accident. Summary  The first trimester of pregnancy is from week 1 until the end of week 13 (months 1 through 3).  To take care of yourself and your unborn baby, you will need to eat healthy meals, take medicines only if your doctor tells you to do so, and do activities that are safe for you and your baby.  Keep all follow-up visits as told by your doctor. This is important as your doctor will have to ensure that your baby is healthy and growing well. This information is not intended to replace advice given to you by your health care provider. Make sure you discuss any questions you have with your health care provider. Document Released: 12/12/2007 Document Revised: 07/03/2016 Document Reviewed: 07/03/2016 Elsevier Interactive Patient Education  2017 Elsevier Inc.     

## 2018-02-11 NOTE — Progress Notes (Signed)
Subjective:   Christie [name] is a 32 y.o. [id number] at 80108w6d by LMP being seen today for her first obstetrical visit.  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 Patient does intend to breast feed. Pregnancy history fully reviewed.  Patient reports nausea and vomiting. Also reports more feelings of depression, wants to restart her Zoloft. Wants something for occasional migraines, Tylenol is not helping.  HISTORY: OB History  Gravida Para Term Preterm AB Living  4 2 2  0 1 2  SAB TAB Ectopic Multiple Live Births  0 1 0 0 2    # Outcome Date GA Lbr Len/2nd Weight Sex Delivery Anes PTL Lv  4 Current           3 TAB 12/27/11 7061w0d            Birth Comments: no complications  2 Term 09/12/07   9 lb 6 oz (4.252 kg) M Vag-Spont EPI N LIV     Birth Comments: pre eclampsia  1 Term     M Vag-Spont   LIV    Past Medical History:  Diagnosis Date  . Abnormal Pap smear    colpo scheduled  . Anemia   . Anxiety   . Asthma   . Chlamydia   . Depression   . Diabetes mellitus    type 1 on insulin  . Fx ankle    history of left ankle fx at age 32  . Headache(784.0)   . Hx of migraines   . Pregnancy induced hypertension   . Urinary tract infection    Past Surgical History:  Procedure Laterality Date  . COLPOSCOPY    . 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 WISDOM TOOTH EXTRACTION     Family History  Problem Relation Age of Onset  . Asthma Mother   . Diabetes Mother   . Hypertension Mother   . Asthma Father   . Hypertension Sister   . Diabetes Maternal Grandmother   . Cancer Maternal Grandmother        breast  . Obesity Other   . Sleep apnea Other   . Anesthesia problems Neg Hx    Social History   Tobacco Use  . Smoking status: Never Smoker  . Smokeless tobacco: Never Used  Substance Use Topics  . Alcohol use: Yes    Comment: social drinker  . Drug use: No    Comment: last used 1  years ago   Allergies  Allergen Reactions  . Banana Anaphylaxis  . Citrus Other (See Comments)    Gums bleed  . Adhesive [Tape] Rash   Current Outpatient Medications on File Prior to Visit  Medication Sig Dispense Refill  . glucagon (GLUCAGON EMERGENCY) 1 MG injection Inject 1 mg into the muscle once as needed. 1 each 12  . insulin aspart (NOVOLOG) 100 UNIT/ML injection Inject 25 Units into the skin 3 (three) times daily before meals. Patient will need an appointment for further refills. 10 mL 1  . Insulin Human (INSULIN PUMP) SOLN Inject into the skin.    . Prenatal Vit-Fe Fumarate-FA (PRENATAL MULTIVITAMIN) TABS tablet Take 1 tablet by mouth daily at 12 noon.    . ARIPiprazole (ABILIFY) 5 MG tablet Take 1 tablet (5 mg total) by mouth daily. (Patient not taking: Reported on 09/23/2017) 30 tablet 2  . etonogestrel-ethinyl estradiol (NUVARING) 0.12-0.015 MG/24HR vaginal  ring Insert vaginally and leave in place for 3 consecutive weeks, then remove for 1 week. (Patient not taking: Reported on 09/23/2017) 1 each 12  . insulin glargine (LANTUS) 100 UNIT/ML injection Inject 0.35 mLs (35 Units total) into the skin daily with breakfast. Patient need an appointment for further refills. (Patient not taking: Reported on 09/23/2017) 10 mL 1  . Insulin Pen Needle 31G X 8 MM MISC As needed for insulin injections (Patient not taking: Reported on 09/23/2017) 100 each 1   No current facility-administered medications on file prior to visit.     Review of Systems Pertinent items noted in HPI and remainder of comprehensive ROS otherwise negative.  Exam   Vitals:   02/11/18 1023  BP: 117/77  Pulse: 90  Weight: 232 lb 8 oz (105.5 kg)   Fetal Heart Rate (bpm): 174  General: well-developed, well-nourished female in no acute distress  Breast:  normal appearance, no masses or tenderness  Skin: normal coloration and turgor, no rashes  Neurologic: oriented, normal, negative, normal mood  Extremities: normal  strength, tone, and muscle mass, ROM of all joints is normal  HEENT PERRLA, extraocular movement intact and sclera clear, anicteric  Mouth/Teeth mucous membranes moist, pharynx normal without lesions and dental hygiene good  Neck supple and no masses  Cardiovascular: regular rate and rhythm  Respiratory:  no respiratory distress, normal breath sounds  Abdomen: soft, non-tender; bowel sounds normal; no masses,  no organomegaly  Pelvic: deferred     Assessment:   Pregnancy: [id number] Patient Active Problem List   Diagnosis Date Noted  . Preexisting diabetes complicating pregnancy, antepartum 02/11/2018  . Supervision of high-risk pregnancy 02/11/2018  . History of loop electrical excision procedure (LEEP) 09/23/2017  . Back pain 04/27/2016  . Abnormal Papanicolaou smear of cervix with positive human papilloma virus (HPV) test 09/02/2015  . Adjustment disorder with depressed mood 08/03/2015  . 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 Asthma, mild intermittent 07/06/2013  . Type I diabetes mellitus with complication, uncontrolled (HCC) 06/18/2011  . History of migraine during pregnancy 06/18/2011     Plan:  1. Preexisting diabetes complicating pregnancy, antepartum On insulin pump. Managed by Dr. Sharl Ma at [facility], will continue during pregnancy.  - Genetic Screening - Obstetric Panel, Including HIV - SMN1 COPY NUMBER ANALYSIS (SMA Carrier Screen) - Cystic fibrosis gene test - Culture, OB Urine - Hemoglobinopathy Evaluation - [place] MFM OB DETAIL +14 WK; Future - Comprehensive metabolic panel - Hemoglobin A1c - TSH - Protein / creatinine ratio, urine - aspirin EC 81 MG tablet; Take 1 tablet (81 mg total) by mouth daily. Take after 12 weeks for prevention of preeclampsia later in pregnancy  Dispense: 300 tablet; Refill: 2 - US Fetal Echocardiography; Future - Urine cytology ancillary only  2. Nausea and vomiting of pregnancy, antepartum Antiemetics  prescribed - promethazine (PHENERGAN) 25 MG tablet; Take 1 tablet (25 mg total) by mouth every 6 (six) hours as needed for nausea or vomiting.  Dispense: 30 tablet; Refill: 2 - metoCLOPramide (REGLAN) 10 MG tablet; Take 1 tablet (10 mg total) by mouth 4 (four) times daily as needed for nausea or vomiting.  Dispense: 30 tablet; Refill: 2  3. Depression affecting pregnancy in first trimester, antepartum Zoloft prescribed.  Patient verbally consented to meet with [facility] Clinician about presenting concerns. - sertraline (ZOLOFT) 100 MG tablet; Take 1 tablet (100 mg total) by mouth daily.  Dispense: 30 tablet; Refill: 4  4. History of loop electrical  excision procedure (LEEP) Early cervical length scan ordered - US MFM OB Transvaginal; Future  5. History of migraine during pregnancy Fioricet prescribed - Butalbital-APAP-Caffeine 50-325-40 MG capsule; Take 1-2 capsules by mouth every 6 (six) hours as needed for headache.  Dispense: 30 capsule; Refill: 3  6. Supervision of high risk pregnancy in first trimester Initial labs drawn. Continue prenatal vitamins. Genetic Screening discussed, NIPS: ordered. Ultrasound discussed; fetal anatomic survey: ordered. Problem list reviewed and updated. The nature of [facility] with multiple MDs and other Advanced Practice Providers was explained to patient; also emphasized that residents, students are part of our team. Routine obstetric precautions reviewed. Return in about 1 month (around 03/11/2018) for OB Visit (HOB).     [name], MD, FACOG Obstetrician & Gynecologist, [facility] for [employer], [facility]

## 2018-02-12 LAB — PROTEIN / CREATININE RATIO, URINE
CREATININE, UR: 381.3 mg/dL
Protein, Ur: 29.8 mg/dL
Protein/Creat Ratio: 78 mg/g creat (ref 0–200)

## 2018-02-16 LAB — URINE CULTURE, OB REFLEX

## 2018-02-16 LAB — CULTURE, OB URINE

## 2018-02-17 ENCOUNTER — Inpatient Hospital Stay (HOSPITAL_COMMUNITY)
Admission: AD | Admit: 2018-02-17 | Discharge: 2018-02-17 | Disposition: A | Discharge status: Home / Self Care | Payer: Medicaid Other | Source: Ambulatory Visit | Attending: Obstetrics and Gynecology | Admitting: Obstetrics and Gynecology

## 2018-02-17 ENCOUNTER — Other Ambulatory Visit: Payer: Self-pay

## 2018-02-17 ENCOUNTER — Encounter (HOSPITAL_COMMUNITY): Payer: Self-pay | Admitting: *Deleted

## 2018-02-17 DIAGNOSIS — E109 Type 1 diabetes mellitus without complications: Secondary | ICD-10-CM | POA: Diagnosis not present

## 2018-02-17 DIAGNOSIS — N949 Unspecified condition associated with female genital organs and menstrual cycle: Secondary | ICD-10-CM

## 2018-02-17 DIAGNOSIS — O26891 Other specified pregnancy related conditions, first trimester: Secondary | ICD-10-CM | POA: Diagnosis present

## 2018-02-17 DIAGNOSIS — Z794 Long term (current) use of insulin: Secondary | ICD-10-CM | POA: Insufficient documentation

## 2018-02-17 DIAGNOSIS — O24011 Pre-existing diabetes mellitus, type 1, in pregnancy, first trimester: Secondary | ICD-10-CM | POA: Insufficient documentation

## 2018-02-17 DIAGNOSIS — R1030 Lower abdominal pain, unspecified: Secondary | ICD-10-CM | POA: Diagnosis present

## 2018-02-17 DIAGNOSIS — R102 Pelvic and perineal pain: Secondary | ICD-10-CM | POA: Diagnosis not present

## 2018-02-17 DIAGNOSIS — Z3A10 10 weeks gestation of pregnancy: Secondary | ICD-10-CM

## 2018-02-17 LAB — URINALYSIS, ROUTINE W REFLEX MICROSCOPIC
Bilirubin Urine: NEGATIVE
Glucose, UA: 50 mg/dL — AB
Hgb urine dipstick: NEGATIVE
KETONES UR: 80 mg/dL — AB
LEUKOCYTES UA: NEGATIVE
Nitrite: NEGATIVE
PROTEIN: NEGATIVE mg/dL
Specific Gravity, Urine: 1.025 (ref 1.005–1.030)
pH: 5 (ref 5.0–8.0)

## 2018-02-17 NOTE — MAU Provider Note (Signed)
History     CSN: [id number]  Arrival date and time: 02/17/18 1114   First Provider Initiated Contact with Patient 02/17/18 1238     Chief Complaint  Patient presents with  . Abdominal Pain   HPI Christie [name] is a 32 y.o. [id number] at [redacted]w[redacted]d who presents with sharp shooting lower abdominal pain. She states it started yesterday and got worse today. She states it is worse with movement or changing positions. She denies any dysuria, constipation or diarrhea. Denies any vaginal bleeding or discharge.   OB History    Gravida  5   Para  2   Term  2   Preterm  0   AB  1   Living  2     SAB  0   TAB  1   Ectopic  0   Multiple  0   Live Births  2           Past Medical History:  Diagnosis Date  . Abnormal Pap smear    colpo scheduled  . Anemia   . Anxiety   . Asthma   . Chlamydia   . Depression   . Diabetes mellitus    type 1 on insulin  . Fx ankle    history of left ankle fx at age 6  . Headache(784.0)   . Hx of migraines   . Pregnancy induced hypertension   . Urinary tract infection     Past Surgical History:  Procedure Laterality Date  . COLPOSCOPY    . 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 WISDOM TOOTH EXTRACTION      Family History  Problem Relation Age of Onset  . Asthma Mother   . Diabetes Mother   . Hypertension Mother   . Asthma Father   . Hypertension Sister   . Diabetes Maternal Grandmother   . Cancer Maternal Grandmother        breast  . Obesity Other   . Sleep apnea Other   . Anesthesia problems Neg Hx     Social History   Tobacco Use  . Smoking status: Never Smoker  . Smokeless tobacco: Never Used  Substance Use Topics  . Alcohol use: Yes    Comment: social drinker  . Drug use: No    Comment: last used 1 years ago    Allergies:  Allergies  Allergen Reactions  . Banana Anaphylaxis  . Citrus Other (See Comments)    Gums bleed  . Adhesive [Tape] Rash    Medications Prior to Admission  Medication Sig  Dispense Refill Last Dose  . ARIPiprazole (ABILIFY) 5 MG tablet Take 1 tablet (5 mg total) by mouth daily. (Patient not taking: Reported on 09/23/2017) 30 tablet 2 Not Taking  . aspirin EC 81 MG tablet Take 1 tablet (81 mg total) by mouth daily. Take after 12 weeks for prevention of preeclampsia later in pregnancy 300 tablet 2   . Butalbital-APAP-Caffeine 50-325-40 MG capsule Take 1-2 capsules by mouth every 6 (six) hours as needed for headache. 30 capsule 3   . etonogestrel-ethinyl estradiol (NUVARING) 0.12-0.015 MG/24HR vaginal ring Insert vaginally and leave in place for 3 consecutive weeks, then remove for 1 week. (Patient not taking: Reported on 09/23/2017) 1 each 12 Not Taking  . glucagon (GLUCAGON EMERGENCY) 1 MG injection Inject 1 mg into the muscle once as needed. 1 each 12 Taking  . insulin aspart (NOVOLOG) 100 UNIT/ML injection Inject 25 Units into the skin 3 (three)  times daily before meals. Patient will need an appointment for further refills. 10 mL 1 Taking  . insulin glargine (LANTUS) 100 UNIT/ML injection Inject 0.35 mLs (35 Units total) into the skin daily with breakfast. Patient need an appointment for further refills. (Patient not taking: Reported on 09/23/2017) 10 mL 1 Not Taking  . Insulin Human (INSULIN PUMP) SOLN Inject into the skin.   Taking  . Insulin Pen Needle 31G X 8 MM MISC As needed for insulin injections (Patient not taking: Reported on 09/23/2017) 100 each 1 Not Taking  . metoCLOPramide (REGLAN) 10 MG tablet Take 1 tablet (10 mg total) by mouth 4 (four) times daily as needed for nausea or vomiting. 30 tablet 2   . Prenatal Vit-Fe Fumarate-FA (PRENATAL MULTIVITAMIN) TABS tablet Take 1 tablet by mouth daily at 12 noon.   Taking  . promethazine (PHENERGAN) 25 MG tablet Take 1 tablet (25 mg total) by mouth every 6 (six) hours as needed for nausea or vomiting. 30 tablet 2   . sertraline (ZOLOFT) 100 MG tablet Take 1 tablet (100 mg total) by mouth daily. 30 tablet 4    Review of  Systems  Constitutional: Negative.  Negative for fatigue and fever.  HENT: Negative.   Respiratory: Negative.  Negative for shortness of breath.   Cardiovascular: Negative.  Negative for chest pain.  Gastrointestinal: Positive for abdominal pain. Negative for constipation, diarrhea, nausea and vomiting.  Genitourinary: Negative.  Negative for dysuria, vaginal bleeding and vaginal discharge.  Neurological: Negative.  Negative for dizziness and headaches.   Physical Exam   Blood pressure 136/86, pulse 80, temperature 98.4 F (36.9 C), temperature source Oral, resp. rate 18, height 5\' 9"  (1.753 m), weight 106.1 kg, last menstrual period 12/04/2017, SpO2 100 %, unknown if currently breastfeeding.  Physical Exam  Nursing note and vitals reviewed. Constitutional: She is oriented to person, place, and time. She appears well-developed and well-nourished. No distress.  HENT:  Head: Normocephalic.  Eyes: Pupils are equal, round, and reactive to light.  Cardiovascular: Normal rate, regular rhythm and normal heart sounds.  Respiratory: Effort normal and breath sounds normal. No respiratory distress.  GI: Soft. Bowel sounds are normal. She exhibits no distension. There is no tenderness. There is no rebound and no guarding.  Neurological: She is alert and oriented to person, place, and time.  Skin: Skin is warm and dry.  Psychiatric: She has a normal mood and affect. Her behavior is normal. Judgment and thought content normal.    MAU Course  Procedures Results for orders placed or performed during the hospital encounter of 02/17/18 (from the past 24 hour(s))  Urinalysis, Routine w reflex microscopic     Status: Abnormal   Collection Time: 02/17/18 12:59 PM  Result Value Ref Range   Color, Urine YELLOW YELLOW   APPearance HAZY (A) CLEAR   Specific Gravity, Urine 1.025 1.005 - 1.030   pH 5.0 5.0 - 8.0   Glucose, UA 50 (A) NEGATIVE mg/dL   Hgb urine dipstick NEGATIVE NEGATIVE   Bilirubin Urine  NEGATIVE NEGATIVE   Ketones, ur 80 (A) NEGATIVE mg/dL   Protein, ur NEGATIVE NEGATIVE mg/dL   Nitrite NEGATIVE NEGATIVE   Leukocytes, UA NEGATIVE NEGATIVE    MDM UA Cervical exam Limited bedside u/s- IUP measuring 4279w2d with FHR of 146bpm  Assessment and Plan   1. Round ligament pain   2. [redacted] weeks gestation of pregnancy    -Discharge home in stable condition -Round ligament precautions discussed. Encouraged patient to use  maternity support belt.  -[facility].  -Patient may return to MAU as needed or if her condition were to change or worsen  Rolm BookbinderCaroline M Neill CNM 02/17/2018, 12:38 PM

## 2018-02-17 NOTE — Discharge Instructions (Signed)

## 2018-02-17 NOTE — MAU Note (Signed)
Pt. States lower abd. Pain started last Friday.  Pt. Denies vag. Bleeding or dc.

## 2018-02-20 ENCOUNTER — Telehealth: Payer: Self-pay | Admitting: Obstetrics & Gynecology

## 2018-02-20 NOTE — Telephone Encounter (Signed)
Patient called and stated she was in the MAU the other night, and was told she was dehydrated. However, she is not able to keep anything down not even water. Jeanetta RN suggested she take her medications, and try to drink plenty of water. I informed patient to rest, and she could come back to MAU as instructed if it gets worst. Patient voiced an understanding.

## 2018-02-24 ENCOUNTER — Other Ambulatory Visit: Payer: Self-pay | Admitting: Obstetrics and Gynecology

## 2018-02-24 DIAGNOSIS — O0991 Supervision of high risk pregnancy, unspecified, first trimester: Secondary | ICD-10-CM

## 2018-02-24 LAB — COMPREHENSIVE METABOLIC PANEL
ALBUMIN: 4.1 g/dL (ref 3.5–5.5)
ALT: 31 IU/L (ref 0–32)
AST: 17 IU/L (ref 0–40)
Albumin/Globulin Ratio: 1.3 (ref 1.2–2.2)
Alkaline Phosphatase: 55 IU/L (ref 39–117)
BUN / CREAT RATIO: 12 (ref 9–23)
BUN: 9 mg/dL (ref 6–20)
Bilirubin Total: 0.4 mg/dL (ref 0.0–1.2)
CALCIUM: 9.4 mg/dL (ref 8.7–10.2)
CO2: 20 mmol/L (ref 20–29)
CREATININE: 0.73 mg/dL (ref 0.57–1.00)
Chloride: 99 mmol/L (ref 96–106)
GFR calc Af Amer: 129 mL/min/{1.73_m2} (ref >59)
GFR, EST NON AFRICAN AMERICAN: 112 mL/min/{1.73_m2} (ref >59)
GLUCOSE: 170 mg/dL — AB (ref 65–99)
Globulin, Total: 3.1 g/dL (ref 1.5–4.5)
Potassium: 4.1 mmol/L (ref 3.5–5.2)
Sodium: 136 mmol/L (ref 134–144)
Total Protein: 7.2 g/dL (ref 6.0–8.5)

## 2018-02-24 LAB — SMN1 COPY NUMBER ANALYSIS (SMA CARRIER SCREENING)

## 2018-02-24 LAB — OBSTETRIC PANEL, INCLUDING HIV
ANTIBODY SCREEN: NEGATIVE
BASOS: 0 %
Basophils Absolute: 0 10*3/uL (ref 0.0–0.2)
EOS (ABSOLUTE): 0 10*3/uL (ref 0.0–0.4)
EOS: 0 %
HEMATOCRIT: 40.4 % (ref 34.0–46.6)
HEMOGLOBIN: 13.8 g/dL (ref 11.1–15.9)
HIV Screen 4th Generation wRfx: NONREACTIVE
Hepatitis B Surface Ag: NEGATIVE
Immature Grans (Abs): 0 10*3/uL (ref 0.0–0.1)
Immature Granulocytes: 0 %
LYMPHS ABS: 2 10*3/uL (ref 0.7–3.1)
LYMPHS: 38 %
MCH: 26.7 pg (ref 26.6–33.0)
MCHC: 34.2 g/dL (ref 31.5–35.7)
MCV: 78 fL — ABNORMAL LOW (ref 79–97)
MONOS ABS: 0.4 10*3/uL (ref 0.1–0.9)
Monocytes: 8 %
NEUTROS PCT: 54 %
Neutrophils Absolute: 2.8 10*3/uL (ref 1.4–7.0)
Platelets: 237 10*3/uL (ref 150–450)
RBC: 5.16 x10E6/uL (ref 3.77–5.28)
RDW: 16 % — AB (ref 12.3–15.4)
RH TYPE: POSITIVE
RPR Ser Ql: NONREACTIVE
Rubella Antibodies, IGG: 4.96 index (ref >0.99)
WBC: 5.2 10*3/uL (ref 3.4–10.8)

## 2018-02-24 LAB — ALPHA-THALASSEMIA

## 2018-02-24 LAB — CYSTIC FIBROSIS GENE TEST

## 2018-02-24 LAB — HEMOGLOBINOPATHY EVALUATION
Ferritin: 20 ng/mL (ref 15–150)
Hgb A2 Quant: 2.2 % (ref 1.8–3.2)
Hgb A: 97.8 % (ref 96.4–98.8)
Hgb C: 0 %
Hgb F Quant: 0 % (ref 0.0–2.0)
Hgb S: 0 %
Hgb Solubility: NEGATIVE
Hgb Variant: 0 %

## 2018-02-24 LAB — TSH: TSH: 3.42 u[IU]/mL (ref 0.450–4.500)

## 2018-02-24 LAB — HEMOGLOBIN A1C
ESTIMATED AVERAGE GLUCOSE: 183 mg/dL
HEMOGLOBIN A1C: 8 % — AB (ref 4.8–5.6)

## 2018-02-26 ENCOUNTER — Encounter: Payer: Self-pay | Admitting: *Deleted

## 2018-03-04 ENCOUNTER — Other Ambulatory Visit (HOSPITAL_COMMUNITY): Payer: Self-pay

## 2018-03-04 ENCOUNTER — Encounter (HOSPITAL_COMMUNITY): Payer: Self-pay

## 2018-03-04 ENCOUNTER — Ambulatory Visit (HOSPITAL_COMMUNITY)
Admission: RE | Admit: 2018-03-04 | Discharge: 2018-03-04 | Disposition: A | Discharge status: Home / Self Care | Payer: Medicaid Other | Source: Ambulatory Visit | Attending: Obstetrics & Gynecology | Admitting: Obstetrics & Gynecology

## 2018-03-04 DIAGNOSIS — O99211 Obesity complicating pregnancy, first trimester: Secondary | ICD-10-CM | POA: Diagnosis not present

## 2018-03-04 DIAGNOSIS — J4520 Mild intermittent asthma, uncomplicated: Secondary | ICD-10-CM | POA: Insufficient documentation

## 2018-03-04 DIAGNOSIS — O09291 Supervision of pregnancy with other poor reproductive or obstetric history, first trimester: Secondary | ICD-10-CM | POA: Insufficient documentation

## 2018-03-04 DIAGNOSIS — Z3A12 12 weeks gestation of pregnancy: Secondary | ICD-10-CM | POA: Insufficient documentation

## 2018-03-04 DIAGNOSIS — E669 Obesity, unspecified: Secondary | ICD-10-CM | POA: Diagnosis not present

## 2018-03-04 DIAGNOSIS — O3441 Maternal care for other abnormalities of cervix, first trimester: Secondary | ICD-10-CM | POA: Insufficient documentation

## 2018-03-04 DIAGNOSIS — Z794 Long term (current) use of insulin: Secondary | ICD-10-CM | POA: Insufficient documentation

## 2018-03-04 DIAGNOSIS — O09293 Supervision of pregnancy with other poor reproductive or obstetric history, third trimester: Secondary | ICD-10-CM | POA: Diagnosis not present

## 2018-03-04 DIAGNOSIS — Z9889 Other specified postprocedural states: Secondary | ICD-10-CM

## 2018-03-04 DIAGNOSIS — Z3686 Encounter for antenatal screening for cervical length: Secondary | ICD-10-CM | POA: Insufficient documentation

## 2018-03-04 DIAGNOSIS — O99511 Diseases of the respiratory system complicating pregnancy, first trimester: Secondary | ICD-10-CM | POA: Insufficient documentation

## 2018-03-04 DIAGNOSIS — O24011 Pre-existing diabetes mellitus, type 1, in pregnancy, first trimester: Secondary | ICD-10-CM | POA: Insufficient documentation

## 2018-03-04 HISTORY — DX: Bipolar disorder, unspecified: F31.9

## 2018-03-05 ENCOUNTER — Encounter: Payer: Self-pay | Admitting: Family Medicine

## 2018-03-05 ENCOUNTER — Ambulatory Visit: Payer: Medicaid Other

## 2018-03-12 ENCOUNTER — Ambulatory Visit (INDEPENDENT_AMBULATORY_CARE_PROVIDER_SITE_OTHER): Payer: Medicaid Other | Admitting: Obstetrics & Gynecology

## 2018-03-12 ENCOUNTER — Encounter: Payer: Self-pay | Admitting: Obstetrics & Gynecology

## 2018-03-12 ENCOUNTER — Ambulatory Visit: Payer: Self-pay | Admitting: Clinical

## 2018-03-12 DIAGNOSIS — O0992 Supervision of high risk pregnancy, unspecified, second trimester: Secondary | ICD-10-CM

## 2018-03-12 DIAGNOSIS — D563 Thalassemia minor: Secondary | ICD-10-CM

## 2018-03-12 DIAGNOSIS — O24312 Unspecified pre-existing diabetes mellitus in pregnancy, second trimester: Secondary | ICD-10-CM

## 2018-03-12 DIAGNOSIS — O24319 Unspecified pre-existing diabetes mellitus in pregnancy, unspecified trimester: Secondary | ICD-10-CM

## 2018-03-12 DIAGNOSIS — D560 Alpha thalassemia: Secondary | ICD-10-CM | POA: Insufficient documentation

## 2018-03-12 LAB — POCT URINALYSIS DIP (DEVICE)
BILIRUBIN URINE: NEGATIVE
Glucose, UA: 500 mg/dL — AB
Hgb urine dipstick: NEGATIVE
Ketones, ur: 80 mg/dL — AB
Leukocytes, UA: NEGATIVE
NITRITE: NEGATIVE
PH: 5.5 (ref 5.0–8.0)
PROTEIN: NEGATIVE mg/dL
Specific Gravity, Urine: 1.015 (ref 1.005–1.030)
Urobilinogen, UA: 0.2 mg/dL (ref 0.0–1.0)

## 2018-03-12 NOTE — Patient Instructions (Addendum)
Alpha thalassemia trait positive   Sherilyn Cooter needs hemoglobinopathy evaluation (not just sickle cell screen).   Second Trimester of Pregnancy The second trimester is from week 13 through week 28, month 4 through 6. This is often the time in pregnancy that you feel your best. Often times, morning sickness has lessened or quit. You may have more energy, and you may get hungry more often. Your unborn baby (fetus) is growing rapidly. At the end of the sixth month, he or she is about 9 inches long and weighs about 1 pounds. You will likely feel the baby move (quickening) between 18 and 20 weeks of pregnancy.  Research childbirth classes and hospital preregistration at [email or web address]  Follow these instructions at home:  Avoid all smoking, herbs, and alcohol. Avoid drugs not approved by your doctor.  Do not use any tobacco products, including cigarettes, chewing tobacco, and electronic cigarettes. If you need help quitting, ask your doctor. You may get counseling or other support to help you quit.  Only take medicine as told by your doctor. Some medicines are safe and some are not during pregnancy.  Exercise only as told by your doctor. Stop exercising if you start having cramps.  Eat regular, healthy meals.  Wear a good support bra if your breasts are tender.  Do not use hot tubs, steam rooms, or saunas.  Wear your seat belt when driving.  Avoid raw meat, uncooked cheese, and liter boxes and soil used by cats.  Take your prenatal vitamins.  Take 1500-2000 milligrams of calcium daily starting at the 20th week of pregnancy until you deliver your baby.  Try taking medicine that helps you poop (stool softener) as needed, and if your doctor approves. Eat more fiber by eating fresh fruit, vegetables, and whole grains. Drink enough fluids to keep your pee (urine) clear or pale yellow.  Take warm water baths (sitz baths) to soothe pain or discomfort caused by hemorrhoids. Use hemorrhoid  cream if your doctor approves.  If you have puffy, bulging veins (varicose veins), wear support hose. Raise (elevate) your feet for 15 minutes, 3-4 times a day. Limit salt in your diet.  Avoid heavy lifting, wear low heals, and sit up straight.  Rest with your legs raised if you have leg cramps or low back pain.  Visit your dentist if you have not gone during your pregnancy. Use a soft toothbrush to brush your teeth. Be gentle when you floss.  You can have sex (intercourse) unless your doctor tells you not to.  Go to your doctor visits.  Get help if:  You feel dizzy.  You have mild cramps or pressure in your lower belly (abdomen).  You have a nagging pain in your belly area.  You continue to feel sick to your stomach (nauseous), throw up (vomit), or have watery poop (diarrhea).  You have bad smelling fluid coming from your vagina.  You have pain with peeing (urination). Get help right away if:  You have a fever.  You are leaking fluid from your vagina.  You have spotting or bleeding from your vagina.  You have severe belly cramping or pain.  You lose or gain weight rapidly.  You have trouble catching your breath and have chest pain.  You notice sudden or extreme puffiness (swelling) of your face, hands, ankles, feet, or legs.  You have not felt the baby move in over an hour.  You have severe headaches that do not go away with medicine.  You have vision changes.  This information is not intended to replace advice given to you by your health care provider. Make sure you discuss any questions you have with your health care provider. Document Released: 09/19/2009 Document Revised: 12/01/2015 Document Reviewed: 08/26/2012 Elsevier Interactive Patient Education  2017 [employer].

## 2018-03-12 NOTE — Progress Notes (Signed)
PRENATAL VISIT NOTE  Subjective:  Christie [name] is a 32 y.o. [phone] at [redacted]w[redacted]d being seen today for ongoing prenatal care.  She is currently monitored for the following issues for this high-risk pregnancy and has Type I diabetes mellitus with complication, uncontrolled (HCC); History of migraine during pregnancy; Dysplasia of cervix, high grade CIN 2; Asthma, mild intermittent; Adjustment disorder with depressed mood; Abnormal Papanicolaou smear of cervix with positive human papilloma virus (HPV) test; Back pain; History of loop electrical excision procedure (LEEP); Preexisting diabetes complicating pregnancy, antepartum; Supervision of high-risk pregnancy; and Alpha+ thalassemia trait on their problem list.  Patient reports no complaints.  Contractions: Not present. Vag. Bleeding: None.  Movement: Present. Denies leaking of fluid.   The following portions of the patient's history were reviewed and updated as appropriate: allergies, current medications, past family history, past medical history, past social history, past surgical history and problem list. Problem list updated.  Objective:   Vitals:   03/12/18 1006  BP: 104/70  Pulse: 90  Weight: 231 lb 11.2 oz (105.1 kg)    Fetal Status: Fetal Heart Rate (bpm): 159   Movement: Present     General:  Alert, oriented and cooperative. Patient is in no acute distress.  Skin: Skin is warm and dry. No rash noted.   Cardiovascular: Normal heart rate noted  Respiratory: Normal respiratory effort, no problems with respiration noted  Abdomen: Soft, gravid, appropriate for gestational age.  Pain/Pressure: Absent     Pelvic: Cervical exam deferred        Extremities: Normal range of motion.  Edema: Trace  Mental Status: Normal mood and affect. Normal behavior. Normal judgment and thought content.   [place] Mfm Ob Transvaginal  Result Date: 03/04/2018 ----------------------------------------------------------------------  OBSTETRICS REPORT                        (Signed Final 03/04/2018 02:13 pm) ---------------------------------------------------------------------- Patient Info  ID #:       [id number]                          D.O.B.:  [birth year] (31 yrs)  Name:       Christie [name]             Visit Date: 03/04/2018 01:31 pm ---------------------------------------------------------------------- Performed By  Performed By:     Tomma Lightning             Ref. Address:     [address]                                                             [place], [place]  [address]  Attending:        Patsi Sears      Location:         [facility]                    MD  Referred By:      Tereso Newcomer MD ---------------------------------------------------------------------- Orders   #  Description                          Code         Ordered By   1  [place] MFM OB TRANSVAGINAL               27062.3      [name]  ----------------------------------------------------------------------   #  Order #                    Accession #                 Episode #   1  [id number]                  [id number]                  [id number]  ---------------------------------------------------------------------- Indications   Previous cervical surgery (h/o LEEP 2017)      O34.40   [redacted] weeks gestation of pregnancy                Z3A.12   Encounter for cervical length                  Z36.86   Pre-existing diabetes, type 1, in pregnancy,   O24.011   first trimester (Novolog)   Poor obstetric history: Previous               O09.299   preeclampsia / eclampsia/gestational HTN   Asthma (Mild, Intermittent)                    O99.89 j45.909   Obesity complicating pregnancy, first          O99.211   trimester (Pre Pregnancy BMI 35)  ---------------------------------------------------------------------- Vital Signs  Weight  (lb): 231                               Height:        5'9"  BMI:         34.11        Pulse:  93  BP:          117/76 ---------------------------------------------------------------------- Fetal Evaluation  Num Of Fetuses:         1  Fetal Heart Rate(bpm):  159  Cardiac Activity:       Observed  Presentation:           Cephalic  Placenta:               Posterior  Amniotic Fluid  AFI FV:      Within normal limits                              Largest Pocket(cm)  3.75 ---------------------------------------------------------------------- OB History  Gravidity:    4         Term:   2  TOP:          1        Living:  2 ---------------------------------------------------------------------- Gestational Age  LMP:           12w 6d        Date:  12/04/17                 EDD:   09/10/18  Best:          12w 6d     Det. By:  LMP  (12/04/17)          EDD:   09/10/18 ---------------------------------------------------------------------- Cervix Uterus Adnexa  Cervix  Length:            5.4  cm.  Measured transvaginally. Appears closed, without funnelling.  Uterus  No abnormality visualized.  Left Ovary  Size(cm)       3.5  x   1.2    x  2.4       Vol(ml): 5.28  Within normal limits.  Right Ovary  Size(cm)       3.7  x   2.6    x  2.7       Vol(ml): 13.6  Within normal limits. ---------------------------------------------------------------------- Comments  U/S images reviewed. Findings reviewed with patient.  Appropriate fetal growth is noted.  No fetal abnormalities are  seen.  Cervix appears closed, intact and measures 5.41 cm  in length.  Patient is scheduled for a detailed anatomic survey  on 04/16/18.  Questions answered.  10 minutes spent face to face.  Recommendations: 1) Fetal ECHO with Pediatric Cardiology  @ 22 weeks 2) Serial U/S every 4 weeks for fetal growth 3)  Weekly BPP beginning @ 32 weeks ---------------------------------------------------------------------- Recommendations  1) Fetal  ECHO with Pediatric Cardiology @ 22 weeks 2)  Serial U/S every 4 weeks for fetal growth 3) Weekly BPP  beginning @ 32 weeks ----------------------------------------------------------------------               Patsi Sears, MD Electronically Signed Final Report   03/04/2018 02:13 pm ----------------------------------------------------------------------   Assessment and Plan:  Pregnancy: [id number] at [redacted]w[redacted]d  1. Preexisting diabetes complicating pregnancy, antepartum On insulin pump. Managed by Dr. Sharl Ma at [facility], will continue during pregnancy. Anatomy scan scheduled.   2. Alpha+ thalassemia trait Patient informed; FOB recommended to obtain hemoglobinopathy evaluation (not just sickle cell screen).  3. Supervision of high risk pregnancy in second trimester No other complaints or concerns.  Routine obstetric precautions reviewed. Please refer to After Visit Summary for other counseling recommendations.  Return in about 4 weeks (around 04/09/2018) for OB Visit (HOB).  Future Appointments  Date Time Provider Department Center  03/26/2018  4:15 PM Tamera Stands, DO [facility]OC-[facility]OCA WOC  04/09/2018  4:15 PM Conan Bowens, MD [facility]OC[facility] WOC  04/16/2018  9:30 AM WH-MFC [place] 1 WH-MFCUS MFC-US  04/23/2018  4:15 PM Conan Bowens, MD [facility]OC[facility] WOC  05/07/2018  4:15 PM Jahsiah Carpenter, Jethro Bastos, MD [facility]OC-[facility] WOC    [name], MD

## 2018-03-12 NOTE — Progress Notes (Signed)
Pt has Fetal Echo with Dr. Elizebeth Brooking scheduled for Thursday October 31st at 0830  Dr. Elizebeth Brooking office #: [phone]

## 2018-03-12 NOTE — BH Specialist Note (Signed)
Integrated Behavioral Health Follow Up Visit  MRN: [id number] Name: Christie [name]  Number of Integrated Behavioral Health Clinician visits: 2/6 3 Total Session Start time: 10:58  Session End time: 11:11 Total time: 15 minutes  Type of Service: Integrated Behavioral Health- Individual/Family Interpretor:No. Interpretor Name and Language: n/a  SUBJECTIVE: Christie [name] is a 32 y.o. female accompanied by Partner/Significant Other Patient was referred by [name], MDfor symptoms of depression and anxiety. Patient reports the following symptoms/concerns: Pt states her primary concern today is a lack of sex drive since taking Zoloft, and does not want this to affect her relationship with FOB. Pt states no additional side effects.  Duration of problem: About one month; Severity of problem: moderate  OBJECTIVE: Mood: Anxious and Affect: Appropriate Risk of harm to self or others: No plan to harm self or others  LIFE CONTEXT: Family and Social: Pt lives with FOB and children(10 and 5yo) School/Work: - Self-Care: Advocating for herself Life Changes: Current pregnancy  GOALS ADDRESSED: Patient will: 1.  Reduce symptoms of: mood instability and stress  2.  Increase knowledge and/or ability of: stress reduction  3.  Demonstrate ability to: Increase healthy adjustment to current life circumstances  INTERVENTIONS: Interventions utilized:  Solution-Focused Strategies Standardized Assessments completed: GAD-7 and PHQ 9  ASSESSMENT: Patient currently experiencing Adjustment disorder with mixed anxious and depressed mood.   Patient may benefit from continued brief therapeutic interventions regarding coping with symptoms of anxiety and depression  PLAN: 1. Follow up with behavioral health clinician on: One month, or as needed 2. Behavioral recommendations:  -Continue taking medication as prescribed by medical provider -Pt and FOB increase time at gym together(FOB working  out; pt use massage chairs) -Continue to prioritize sleep, and time together as a couple 3. Referral(s): Integrated [employer] (In Clinic) 4. "From scale of 1-10, how likely are you to follow plan?": 10  Valetta Close Khaden Gater, LCSW   GAD 7 : Generalized Anxiety Score 03/12/2018 02/11/2018  Nervous, Anxious, on Edge 1 3  Control/stop worrying 3 3  Worry too much - different things 3 3  Trouble relaxing 3 3  Restless 0 1  Easily annoyed or irritable 3 3  Afraid - awful might happen 0 2  Total GAD 7 Score 13 18

## 2018-03-19 ENCOUNTER — Inpatient Hospital Stay (HOSPITAL_COMMUNITY)
Admission: AD | Admit: 2018-03-19 | Discharge: 2018-03-19 | Disposition: A | Discharge status: Home / Self Care | Payer: Medicaid Other | Source: Ambulatory Visit | Attending: Obstetrics & Gynecology | Admitting: Obstetrics & Gynecology

## 2018-03-19 ENCOUNTER — Encounter (HOSPITAL_COMMUNITY): Payer: Self-pay | Admitting: *Deleted

## 2018-03-19 ENCOUNTER — Other Ambulatory Visit: Payer: Self-pay

## 2018-03-19 DIAGNOSIS — O99352 Diseases of the nervous system complicating pregnancy, second trimester: Secondary | ICD-10-CM | POA: Insufficient documentation

## 2018-03-19 DIAGNOSIS — Z3A15 15 weeks gestation of pregnancy: Secondary | ICD-10-CM

## 2018-03-19 DIAGNOSIS — O99512 Diseases of the respiratory system complicating pregnancy, second trimester: Secondary | ICD-10-CM | POA: Insufficient documentation

## 2018-03-19 DIAGNOSIS — J45909 Unspecified asthma, uncomplicated: Secondary | ICD-10-CM | POA: Diagnosis not present

## 2018-03-19 DIAGNOSIS — O24012 Pre-existing diabetes mellitus, type 1, in pregnancy, second trimester: Secondary | ICD-10-CM | POA: Insufficient documentation

## 2018-03-19 DIAGNOSIS — F419 Anxiety disorder, unspecified: Secondary | ICD-10-CM | POA: Diagnosis not present

## 2018-03-19 DIAGNOSIS — E109 Type 1 diabetes mellitus without complications: Secondary | ICD-10-CM | POA: Insufficient documentation

## 2018-03-19 DIAGNOSIS — D563 Thalassemia minor: Secondary | ICD-10-CM | POA: Diagnosis not present

## 2018-03-19 DIAGNOSIS — O99342 Other mental disorders complicating pregnancy, second trimester: Secondary | ICD-10-CM | POA: Diagnosis not present

## 2018-03-19 DIAGNOSIS — E860 Dehydration: Secondary | ICD-10-CM | POA: Insufficient documentation

## 2018-03-19 DIAGNOSIS — Z888 Allergy status to other drugs, medicaments and biological substances status: Secondary | ICD-10-CM | POA: Insufficient documentation

## 2018-03-19 DIAGNOSIS — G43009 Migraine without aura, not intractable, without status migrainosus: Secondary | ICD-10-CM | POA: Diagnosis not present

## 2018-03-19 DIAGNOSIS — O26892 Other specified pregnancy related conditions, second trimester: Secondary | ICD-10-CM | POA: Diagnosis present

## 2018-03-19 DIAGNOSIS — O99282 Endocrine, nutritional and metabolic diseases complicating pregnancy, second trimester: Secondary | ICD-10-CM | POA: Insufficient documentation

## 2018-03-19 DIAGNOSIS — Z794 Long term (current) use of insulin: Secondary | ICD-10-CM | POA: Insufficient documentation

## 2018-03-19 DIAGNOSIS — Z833 Family history of diabetes mellitus: Secondary | ICD-10-CM | POA: Diagnosis not present

## 2018-03-19 DIAGNOSIS — F319 Bipolar disorder, unspecified: Secondary | ICD-10-CM | POA: Diagnosis not present

## 2018-03-19 LAB — URINALYSIS, ROUTINE W REFLEX MICROSCOPIC
BILIRUBIN URINE: NEGATIVE
Hgb urine dipstick: NEGATIVE
Leukocytes, UA: NEGATIVE
Nitrite: NEGATIVE
PH: 5 (ref 5.0–8.0)
Protein, ur: NEGATIVE mg/dL

## 2018-03-19 LAB — COMPREHENSIVE METABOLIC PANEL
ALT: 14 U/L (ref 0–44)
ANION GAP: 18 — AB (ref 5–15)
AST: 18 U/L (ref 15–41)
Albumin: 3.8 g/dL (ref 3.5–5.0)
Alkaline Phosphatase: 63 U/L (ref 38–126)
BUN: 11 mg/dL (ref 6–20)
CALCIUM: 9.2 mg/dL (ref 8.9–10.3)
CHLORIDE: 94 mmol/L — AB (ref 98–111)
CO2: 15 mmol/L — AB (ref 22–32)
CREATININE: 0.8 mg/dL (ref 0.44–1.00)
Glucose, Bld: 296 mg/dL — ABNORMAL HIGH (ref 70–99)
Potassium: 4.6 mmol/L (ref 3.5–5.1)
Sodium: 127 mmol/L — ABNORMAL LOW (ref 135–145)
Total Bilirubin: 0.6 mg/dL (ref 0.3–1.2)
Total Protein: 7.9 g/dL (ref 6.5–8.1)

## 2018-03-19 LAB — URINALYSIS, MICROSCOPIC (REFLEX)

## 2018-03-19 LAB — CBC
HCT: 42.3 % (ref 36.0–46.0)
Hemoglobin: 14.3 g/dL (ref 12.0–15.0)
MCH: 26.4 pg (ref 26.0–34.0)
MCHC: 33.8 g/dL (ref 30.0–36.0)
MCV: 78 fL (ref 78.0–100.0)
PLATELETS: 226 10*3/uL (ref 150–400)
RBC: 5.42 MIL/uL — ABNORMAL HIGH (ref 3.87–5.11)
RDW: 14.8 % (ref 11.5–15.5)
WBC: 7.2 10*3/uL (ref 4.0–10.5)

## 2018-03-19 MED ORDER — METOCLOPRAMIDE HCL 5 MG/ML IJ SOLN
10.0000 mg | Freq: Once | INTRAMUSCULAR | Status: AC
  Administered 2018-03-19: 10 mg via INTRAVENOUS

## 2018-03-19 MED ORDER — DIPHENHYDRAMINE HCL 50 MG/ML IJ SOLN
25.0000 mg | Freq: Once | INTRAMUSCULAR | Status: AC
  Administered 2018-03-19: 25 mg via INTRAVENOUS
  Filled 2018-03-19: qty 1

## 2018-03-19 MED ORDER — LACTATED RINGERS IV BOLUS: 1000.0000 mL | Freq: Once | INTRAVENOUS | Status: DC

## 2018-03-19 NOTE — MAU Note (Signed)
Had a migraine for 3 days, getting worse.  Light and noise sensitive. Nauseated. Taking meds given from clinic, not helping.

## 2018-03-19 NOTE — Discharge Instructions (Signed)

## 2018-03-19 NOTE — MAU Provider Note (Addendum)
History     CSN: [id number]  Arrival date and time: 03/19/18 4431   First Provider Initiated Contact with Patient 03/19/18 1040      Chief Complaint  Patient presents with  . Headache  . Nausea   G4P2012 @15 .0 wks here with migraine HA. Sx started 3 days ago. HA if periorbital. Associated sx are nausea, light and sound sensitivity. No aura. Has tried Fioricet without improvement, took 2 caps last night. Denies abd pain or bleeding. Hx of migraines when not pregnant, never taken meds for. She is on Insulin for Type I DM but did not take any this am because she didn't eat and was feeling bad. She's waiting on supplies for her Insulin pump but has syringes and Insulin for SQ injections that she's been using.  OB History    Gravida  4   Para  2   Term  2   Preterm  0   AB  1   Living  2     SAB  0   TAB  1   Ectopic  0   Multiple  0   Live Births  2           Past Medical History:  Diagnosis Date  . Abnormal Pap smear    colpo scheduled  . Alpha thalassemia trait   . Anemia   . Anxiety   . Asthma   . Bipolar 1 disorder (HCC)   . Chlamydia   . Depression   . Diabetes mellitus    type 1 on insulin  . Fx ankle    history of left ankle fx at age 70  . Headache(784.0)   . Hx of migraines   . Pregnancy induced hypertension   . Urinary tract infection     Past Surgical History:  Procedure Laterality Date  . COLPOSCOPY    . 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 WISDOM TOOTH EXTRACTION      Family History  Problem Relation Age of Onset  . Asthma Mother   . Diabetes Mother   . Hypertension Mother   . Asthma Father   . Hypertension Sister   . Diabetes Maternal Grandmother   . Cancer Maternal Grandmother        breast  . Obesity Other   . Sleep apnea Other   . Anesthesia problems Neg Hx     Social History   Tobacco Use  . Smoking status: Never Smoker  . Smokeless tobacco: Never Used  Substance Use Topics  . Alcohol use: Not Currently   Comment: social drinker  . Drug use: No    Comment: last used 1 years ago    Allergies:  Allergies  Allergen Reactions  . Banana Anaphylaxis  . Citrus Other (See Comments)    Gums bleed  . Adhesive [Tape] Rash    No medications prior to admission.    Review of Systems  Constitutional: Negative for chills and fever.  HENT: Negative for congestion, ear pain, rhinorrhea, sinus pain and sore throat.   Respiratory: Negative for cough.   Gastrointestinal: Negative for abdominal pain.  Neurological: Positive for headaches. Negative for dizziness, weakness, light-headedness and numbness.   Physical Exam   Blood pressure 136/77, pulse 97, temperature 98 F (36.7 C), temperature source Oral, resp. rate 16, weight 102.2 kg, last menstrual period 12/04/2017, SpO2 98 %, unknown if currently breastfeeding.  Physical Exam  Constitutional: She is oriented to person, place, and time. She appears well-developed  and well-nourished. No distress.  HENT:  Head: Normocephalic and atraumatic.  Eyes: Pupils are equal, round, and reactive to light. Conjunctivae and EOM are normal. Lids are everted and swept, no foreign bodies found.  Neck: Normal range of motion.  Cardiovascular: Normal rate.  Respiratory: Effort normal. No respiratory distress.  Musculoskeletal: Normal range of motion. She exhibits no edema.  Neurological: She is alert and oriented to person, place, and time. She has normal reflexes. No cranial nerve deficit.  Skin: Skin is warm and dry.  Psychiatric: She has a normal mood and affect.  FHT 160  Results for orders placed or performed during the hospital encounter of 03/19/18 (from the past 24 hour(s))  Urinalysis, Routine w reflex microscopic     Status: Abnormal   Collection Time: 03/19/18 10:05 AM  Result Value Ref Range   Color, Urine YELLOW YELLOW   APPearance CLEAR CLEAR   Specific Gravity, Urine >1.030 (H) 1.005 - 1.030   pH 5.0 5.0 - 8.0   Glucose, UA >=500 (A)  NEGATIVE mg/dL   Hgb urine dipstick NEGATIVE NEGATIVE   Bilirubin Urine NEGATIVE NEGATIVE   Ketones, ur >80 (A) NEGATIVE mg/dL   Protein, ur NEGATIVE NEGATIVE mg/dL   Nitrite NEGATIVE NEGATIVE   Leukocytes, UA NEGATIVE NEGATIVE  Urinalysis, Microscopic (reflex)     Status: Abnormal   Collection Time: 03/19/18 10:05 AM  Result Value Ref Range   RBC / HPF 0-5 0 - 5 RBC/hpf   WBC, UA 0-5 0 - 5 WBC/hpf   Bacteria, UA RARE (A) NONE SEEN   Squamous Epithelial / LPF 0-5 0 - 5   Urine-Other MUCOUS PRESENT   CBC     Status: Abnormal   Collection Time: 03/19/18 11:35 AM  Result Value Ref Range   WBC 7.2 4.0 - 10.5 K/uL   RBC 5.42 (H) 3.87 - 5.11 MIL/uL   Hemoglobin 14.3 12.0 - 15.0 g/dL   HCT 16.1 09.6 - 04.5 %   MCV 78.0 78.0 - 100.0 fL   MCH 26.4 26.0 - 34.0 pg   MCHC 33.8 30.0 - 36.0 g/dL   RDW 40.9 81.1 - 91.4 %   Platelets 226 150 - 400 K/uL  Comprehensive metabolic panel     Status: Abnormal   Collection Time: 03/19/18 11:35 AM  Result Value Ref Range   Sodium 127 (L) 135 - 145 mmol/L   Potassium 4.6 3.5 - 5.1 mmol/L   Chloride 94 (L) 98 - 111 mmol/L   CO2 15 (L) 22 - 32 mmol/L   Glucose, Bld 296 (H) 70 - 99 mg/dL   BUN 11 6 - 20 mg/dL   Creatinine, Ser 7.82 0.44 - 1.00 mg/dL   Calcium 9.2 8.9 - 95.6 mg/dL   Total Protein 7.9 6.5 - 8.1 g/dL   Albumin 3.8 3.5 - 5.0 g/dL   AST 18 15 - 41 U/L   ALT 14 0 - 44 U/L   Alkaline Phosphatase 63 38 - 126 U/L   Total Bilirubin 0.6 0.3 - 1.2 mg/dL   GFR calc non Af Amer >60 >60 mL/min   GFR calc Af Amer >60 >60 mL/min   Anion gap 18 (H) 5 - 15   MAU Course  Procedures LR Reglan Benadryl  MDM Labs ordered and reviewed. HA greatly improved after meds. Hypergylcemia noted on CMP, instructed to resume Insulin once at home, agrees. Has Reglan and Benadryl at home that can use for HA. Stable for discharge home.   Assessment and Plan  1. [redacted] weeks gestation of pregnancy   2. Dehydration   3. Migraine without aura and without  status migrainosus, not intractable    Discharge home Follow up in OB office as scheduled Hydrate Continue previous meds  Allergies as of 03/19/2018      Reactions   Banana Anaphylaxis   Citrus Other (See Comments)   Gums bleed   Adhesive [tape] Rash      Medication List    STOP taking these medications   ARIPiprazole 5 MG tablet Commonly known as:  ABILIFY     TAKE these medications   aspirin EC 81 MG tablet Take 1 tablet (81 mg total) by mouth daily. Take after 12 weeks for prevention of preeclampsia later in pregnancy   Butalbital-APAP-Caffeine 50-325-40 MG capsule Take 1-2 capsules by mouth every 6 (six) hours as needed for headache.   glucagon 1 MG injection Inject 1 mg into the muscle once as needed.   insulin aspart 100 UNIT/ML injection Commonly known as:  novoLOG Inject 25 Units into the skin 3 (three) times daily before meals. Patient will need an appointment for further refills.   insulin glargine 100 UNIT/ML injection Commonly known as:  LANTUS Inject 0.35 mLs (35 Units total) into the skin daily with breakfast. Patient need an appointment for further refills.   Insulin Pen Needle 31G X 8 MM Misc As needed for insulin injections   insulin pump Soln Inject 100 each into the skin continuous.   metoCLOPramide 10 MG tablet Commonly known as:  REGLAN Take 1 tablet (10 mg total) by mouth 4 (four) times daily as needed for nausea or vomiting.   prenatal multivitamin Tabs tablet Take 1 tablet by mouth daily at 12 noon.   promethazine 25 MG tablet Commonly known as:  PHENERGAN Take 1 tablet (25 mg total) by mouth every 6 (six) hours as needed for nausea or vomiting.   sertraline 100 MG tablet Commonly known as:  ZOLOFT Take 1 tablet (100 mg total) by mouth daily.      Donette Larry, CNM 03/19/2018, 3:15 PM

## 2018-03-26 ENCOUNTER — Encounter: Payer: Self-pay | Admitting: Family Medicine

## 2018-03-31 ENCOUNTER — Ambulatory Visit (INDEPENDENT_AMBULATORY_CARE_PROVIDER_SITE_OTHER): Payer: Medicaid Other | Admitting: General Practice

## 2018-03-31 DIAGNOSIS — O0992 Supervision of high risk pregnancy, unspecified, second trimester: Secondary | ICD-10-CM

## 2018-03-31 DIAGNOSIS — O24319 Unspecified pre-existing diabetes mellitus in pregnancy, unspecified trimester: Secondary | ICD-10-CM

## 2018-03-31 DIAGNOSIS — Z3492 Encounter for supervision of normal pregnancy, unspecified, second trimester: Secondary | ICD-10-CM

## 2018-03-31 NOTE — Progress Notes (Signed)
Patient presents to office today reporting concern that she hasn't felt the baby move yet in her pregnancy. Reassured patient that fetal movement is normally felt between 18-20 weeks, sometimes a little later. Listened to FHR for at least a minute, FHR 156bpm. Patient reports feeling dizzy & lightheaded recently and has had some low blood sugars, lowest 66. States she had to call the ambulance the other day for this. Patient states she hasn't had much of an appetite and gets full quickly. Discussed eating smaller more frequent meals, every 2-3 hours and to make sure she is getting protein as well with meals. Also reviewed the importance of staying well hydrated. Patient verbalized understanding to all & was reassured. Patient had no other questions.

## 2018-03-31 NOTE — Progress Notes (Signed)
Chart reviewed - agree with RN documentation.   

## 2018-04-09 ENCOUNTER — Ambulatory Visit (INDEPENDENT_AMBULATORY_CARE_PROVIDER_SITE_OTHER): Payer: Medicaid Other | Admitting: Obstetrics and Gynecology

## 2018-04-09 ENCOUNTER — Encounter: Payer: Self-pay | Admitting: Obstetrics and Gynecology

## 2018-04-09 VITALS — BP 111/72 | HR 84 | Wt 224.1 lb

## 2018-04-09 DIAGNOSIS — O0992 Supervision of high risk pregnancy, unspecified, second trimester: Secondary | ICD-10-CM

## 2018-04-09 DIAGNOSIS — Z9889 Other specified postprocedural states: Secondary | ICD-10-CM

## 2018-04-09 DIAGNOSIS — O24319 Unspecified pre-existing diabetes mellitus in pregnancy, unspecified trimester: Secondary | ICD-10-CM

## 2018-04-09 NOTE — Progress Notes (Signed)
   PRENATAL VISIT NOTE  Subjective:  Christie [name] is a 32 y.o. [phone] at [redacted]w[redacted]d being seen today for ongoing prenatal care.  She is currently monitored for the following issues for this high-risk pregnancy and has Type I diabetes mellitus with complication, uncontrolled (HCC); History of migraine during pregnancy; Dysplasia of cervix, high grade CIN 2; Asthma, mild intermittent; Adjustment disorder with depressed mood; Abnormal Papanicolaou smear of cervix with positive human papilloma virus (HPV) test; Back pain; History of loop electrical excision procedure (LEEP); Preexisting diabetes complicating pregnancy, antepartum; Supervision of high-risk pregnancy; and Alpha+ thalassemia trait on their problem list.  Patient reports occasional cramping.  Contractions: Not present. Vag. Bleeding: None.  Movement: Absent. Denies leaking of fluid.   The following portions of the patient's history were reviewed and updated as appropriate: allergies, current medications, past family history, past medical history, past social history, past surgical history and problem list. Problem list updated.  Objective:   Vitals:   04/09/18 1619  BP: 111/72  Pulse: 84  Weight: 224 lb 1.6 oz (101.7 kg)    Fetal Status: Fetal Heart Rate (bpm): 158   Movement: Absent     General:  Alert, oriented and cooperative. Patient is in no acute distress.  Skin: Skin is warm and dry. No rash noted.   Cardiovascular: Normal heart rate noted  Respiratory: Normal respiratory effort, no problems with respiration noted  Abdomen: Soft, gravid, appropriate for gestational age.  Pain/Pressure: Present     Pelvic: Cervical exam deferred        Extremities: Normal range of motion.  Edema: Trace  Mental Status: Normal mood and affect. Normal behavior. Normal judgment and thought content.   Assessment and Plan:  Pregnancy: [id number] at [redacted]w[redacted]d  1. Supervision of high risk pregnancy in second trimester  2. History of loop  electrical excision procedure (LEEP)  3. Preexisting diabetes complicating pregnancy, antepartum Has not been checking sugars On insulin pump, managed by Dr. Sharl Ma at [facility], communicates via phone and gets new pump settings via their office encouraged her to continue to check blood glucose Has appt with Dr. Elizebeth Brooking for 05/08/18   Preterm labor symptoms and general obstetric precautions including but not limited to vaginal bleeding, contractions, leaking of fluid and fetal movement were reviewed in detail with the patient. Please refer to After Visit Summary for other counseling recommendations.  Return in about 2 weeks (around 04/23/2018) for OB visit (MD).  Future Appointments  Date Time Provider Department Center  04/16/2018  9:30 AM WH-MFC [place] 1 WH-MFCUS MFC-US  04/23/2018  9:55 AM Conan Bowens, MD WOC-WOCA WOC  05/07/2018  9:15 AM Conan Bowens, MD [facility]    Conan Bowens, MD

## 2018-04-16 ENCOUNTER — Ambulatory Visit (HOSPITAL_COMMUNITY)
Admission: RE | Admit: 2018-04-16 | Discharge: 2018-04-16 | Disposition: A | Discharge status: Home / Self Care | Payer: Medicaid Other | Source: Ambulatory Visit | Attending: Obstetrics & Gynecology | Admitting: Obstetrics & Gynecology

## 2018-04-16 ENCOUNTER — Encounter (HOSPITAL_COMMUNITY): Payer: Self-pay

## 2018-04-16 DIAGNOSIS — J45909 Unspecified asthma, uncomplicated: Secondary | ICD-10-CM | POA: Diagnosis not present

## 2018-04-16 DIAGNOSIS — O24319 Unspecified pre-existing diabetes mellitus in pregnancy, unspecified trimester: Secondary | ICD-10-CM

## 2018-04-16 DIAGNOSIS — O09292 Supervision of pregnancy with other poor reproductive or obstetric history, second trimester: Secondary | ICD-10-CM | POA: Insufficient documentation

## 2018-04-16 DIAGNOSIS — Z363 Encounter for antenatal screening for malformations: Secondary | ICD-10-CM | POA: Insufficient documentation

## 2018-04-16 DIAGNOSIS — O24012 Pre-existing diabetes mellitus, type 1, in pregnancy, second trimester: Secondary | ICD-10-CM | POA: Diagnosis not present

## 2018-04-16 DIAGNOSIS — O99512 Diseases of the respiratory system complicating pregnancy, second trimester: Secondary | ICD-10-CM | POA: Insufficient documentation

## 2018-04-16 DIAGNOSIS — Z3A19 19 weeks gestation of pregnancy: Secondary | ICD-10-CM | POA: Insufficient documentation

## 2018-04-16 DIAGNOSIS — O99212 Obesity complicating pregnancy, second trimester: Secondary | ICD-10-CM | POA: Diagnosis not present

## 2018-04-17 ENCOUNTER — Other Ambulatory Visit (HOSPITAL_COMMUNITY): Payer: Self-pay | Admitting: *Deleted

## 2018-04-17 DIAGNOSIS — Z3689 Encounter for other specified antenatal screening: Secondary | ICD-10-CM

## 2018-04-23 ENCOUNTER — Encounter: Payer: Self-pay | Admitting: Obstetrics and Gynecology

## 2018-05-07 ENCOUNTER — Encounter: Payer: Self-pay | Admitting: Obstetrics & Gynecology

## 2018-05-07 ENCOUNTER — Encounter: Payer: Self-pay | Admitting: Obstetrics and Gynecology

## 2018-05-07 ENCOUNTER — Ambulatory Visit (INDEPENDENT_AMBULATORY_CARE_PROVIDER_SITE_OTHER): Payer: Medicaid Other | Admitting: Obstetrics and Gynecology

## 2018-05-07 VITALS — BP 97/61 | HR 84 | Wt 229.8 lb

## 2018-05-07 DIAGNOSIS — O0992 Supervision of high risk pregnancy, unspecified, second trimester: Secondary | ICD-10-CM

## 2018-05-07 DIAGNOSIS — Z9889 Other specified postprocedural states: Secondary | ICD-10-CM

## 2018-05-07 DIAGNOSIS — O24319 Unspecified pre-existing diabetes mellitus in pregnancy, unspecified trimester: Secondary | ICD-10-CM

## 2018-05-07 DIAGNOSIS — D563 Thalassemia minor: Secondary | ICD-10-CM

## 2018-05-07 DIAGNOSIS — D560 Alpha thalassemia: Secondary | ICD-10-CM

## 2018-05-07 NOTE — Progress Notes (Signed)
   PRENATAL VISIT NOTE  Subjective:  Christie [name] is a 32 y.o. [phone] at [redacted]w[redacted]d being seen today for ongoing prenatal care.  She is currently monitored for the following issues for this high-risk pregnancy and has Type I diabetes mellitus with complication, uncontrolled (HCC); History of migraine during pregnancy; Dysplasia of cervix, high grade CIN 2; Asthma, mild intermittent; Adjustment disorder with depressed mood; Abnormal Papanicolaou smear of cervix with positive human papilloma virus (HPV) test; Back pain; History of loop electrical excision procedure (LEEP); Preexisting diabetes complicating pregnancy, antepartum; Supervision of high-risk pregnancy; and Alpha+ thalassemia trait on their problem list.  Patient reports occasional painful tightening in abdomen.  Contractions: Not present. Vag. Bleeding: None.  Movement: Present. Denies leaking of fluid.   The following portions of the patient's history were reviewed and updated as appropriate: allergies, current medications, past family history, past medical history, past social history, past surgical history and problem list. Problem list updated.  Objective:   Vitals:   05/07/18 0941  BP: 97/61  Pulse: 84  Weight: 229 lb 12.8 oz (104.2 kg)    Fetal Status: Fetal Heart Rate (bpm): 150   Movement: Present     General:  Alert, oriented and cooperative. Patient is in no acute distress.  Skin: Skin is warm and dry. No rash noted.   Cardiovascular: Normal heart rate noted  Respiratory: Normal respiratory effort, no problems with respiration noted  Abdomen: Soft, gravid, appropriate for gestational age.  Pain/Pressure: Present     Pelvic: Cervical exam deferred        Extremities: Normal range of motion.  Edema: None  Mental Status: Normal mood and affect. Normal behavior. Normal judgment and thought content.   Assessment and Plan:  Pregnancy: [id number] at [redacted]w[redacted]d  1. Supervision of high risk pregnancy in second trimester  2.  History of loop electrical excision procedure (LEEP)  3. Alpha+ thalassemia trait  4. Preexisting diabetes complicating pregnancy, antepartum On insulin pump managed by Dr. Sharl Ma at [facility] End[facility] FG: in 90s PP: 140s States her endocrinologist is happy with her BG Reviewed that we would prefer slightly tighter control, but will defer to endo Next appt with endo in November 2019 Had Optho appt, no issues Has fetal echo 05/08/18 Cont baby ASA Repeat growth 05/14/18   Preterm labor symptoms and general obstetric precautions including but not limited to vaginal bleeding, contractions, leaking of fluid and fetal movement were reviewed in detail with the patient. Please refer to After Visit Summary for other counseling recommendations.  Return in about 2 weeks (around 05/21/2018) for OB visit (MD).  Future Appointments  Date Time Provider Department Center  05/14/2018  8:30 AM [facility] [place] 1 WH-MFCUS MFC-US  05/23/2018  9:15 AM Anyanwu, Jethro Bastos, MD [facility]    Conan Bowens, MD

## 2018-05-07 NOTE — Progress Notes (Signed)
Elevated phq9 and gad 7. Declines to see [facility] Jamie today. Offered may make appt as needed. Declines flu shot.

## 2018-05-07 NOTE — Patient Instructions (Signed)
Deciding about Circumcision in Baby Boys  (The Basics)  What is circumcision?  Circumcision is a surgery that removes the skin that covers the tip of the penis, called the "foreskin" Circumcision is usually done when a boy is between 60 and 30 days old. In the [place], circumcision is common. In some other countries, fewer boys are circumcised. Circumcision is a common tradition in some religions.  Should I have my baby boy circumcised?  There is no easy answer. Circumcision has some benefits. But it also has risks. After talking with your doctor, you will have to decide for yourself what is right for your family.  What are the benefits of circumcision?  Circumcised boys seem to have slightly lower rates of: ?Urinary tract infections ?Swelling of the opening at the tip of the penis Circumcised men seem to have slightly lower rates of: ?Urinary tract infections ?Swelling of the opening at the tip of the penis ?Penis cancer ?HIV and other infections that you catch during sex ?Cervical cancer in the women they have sex with Even so, in the [place], the risks of these problems are small - even in boys and men who have not been circumcised. Plus, boys and men who are not circumcised can reduce these extra risks by: ?Cleaning their penis well ?Using condoms during sex  What are the risks of circumcision?  Risks include: ?Bleeding or infection from the surgery ?Damage to or amputation of the penis ?A chance that the doctor will cut off too much or not enough of the foreskin ?A chance that sex won't feel as good later in life Only about 1 out of every 200 circumcisions leads to problems. There is also a chance that your health insurance won't pay for circumcision.  How is circumcision done in baby boys?  First, the baby gets medicine for pain relief. This might be a cream on the skin or a shot into the base of the penis. Next, the doctor cleans the baby's penis well. Then  he or she uses special tools to cut off the foreskin. Finally, the doctor wraps a bandage (called gauze) around the baby's penis. If you have your baby circumcised, his doctor or nurse will give you instructions on how to care for him after the surgery. It is important that you follow those instructions carefully.   Places to have your son circumcised:    [facility] [phone] while you are in hospital  [facility] [phone] $244 by 4 wks  [facility] [phone] $175 by 2 wks  [facility] [phone] $250 by 7 days [facility] [phone] $[phone] by 4 wks  These prices sometimes change but are roughly what you can expect to pay. Please call and confirm pricing.   Circumcision is considered an elective/non-medically necessary procedure. There are many reasons parents decide to have their sons circumsized. During the first year of life circumcised males have a reduced risk of urinary tract infections but after this year the rates between circumcised males and uncircumcised males are the same.  It is safe to have your son circumcised outside of the hospital and the places above perform them regularly.   Deciding about Circumcision in Baby Boys  (Up-to-date The Basics)  What is circumcision?  Circumcision is a surgery that removes the skin that covers the tip of the penis, called the "foreskin" Circumcision is usually done when a boy is between 47 and 64 days old. In the [place], circumcision is common. In some other countries, fewer  boys are circumcised. Circumcision is a common tradition in some religions.  Should I have my baby boy circumcised?  There is no easy answer. Circumcision has some benefits. But it also has risks. After talking with your doctor, you will have to decide for yourself what is right for your  family.  What are the benefits of circumcision?  Circumcised boys seem to have slightly lower rates of: ?Urinary tract infections ?Swelling of the opening at the tip of the penis Circumcised men seem to have slightly lower rates of: ?Urinary tract infections ?Swelling of the opening at the tip of the penis ?Penis cancer ?HIV and other infections that you catch during sex ?Cervical cancer in the women they have sex with Even so, in the [place], the risks of these problems are small - even in boys and men who have not been circumcised. Plus, boys and men who are not circumcised can reduce these extra risks by: ?Cleaning their penis well ?Using condoms during sex  What are the risks of circumcision?  Risks include: ?Bleeding or infection from the surgery ?Damage to or amputation of the penis ?A chance that the doctor will cut off too much or not enough of the foreskin ?A chance that sex won't feel as good later in life Only about 1 out of every 200 circumcisions leads to problems. There is also a chance that your health insurance won't pay for circumcision.  How is circumcision done in baby boys?  First, the baby gets medicine for pain relief. This might be a cream on the skin or a shot into the base of the penis. Next, the doctor cleans the baby's penis well. Then he or she uses special tools to cut off the foreskin. Finally, the doctor wraps a bandage (called gauze) around the baby's penis. If you have your baby circumcised, his doctor or nurse will give you instructions on how to care for him after the surgery. It is important that you follow those instructions carefully.

## 2018-05-14 ENCOUNTER — Ambulatory Visit (HOSPITAL_COMMUNITY)
Admission: RE | Admit: 2018-05-14 | Discharge: 2018-05-14 | Disposition: A | Discharge status: Home / Self Care | Payer: Medicaid Other | Source: Ambulatory Visit | Attending: Obstetrics & Gynecology | Admitting: Obstetrics & Gynecology

## 2018-05-14 ENCOUNTER — Encounter (HOSPITAL_COMMUNITY): Payer: Self-pay

## 2018-05-14 ENCOUNTER — Other Ambulatory Visit (HOSPITAL_COMMUNITY): Payer: Self-pay | Admitting: *Deleted

## 2018-05-14 DIAGNOSIS — O99512 Diseases of the respiratory system complicating pregnancy, second trimester: Secondary | ICD-10-CM | POA: Insufficient documentation

## 2018-05-14 DIAGNOSIS — Z362 Encounter for other antenatal screening follow-up: Secondary | ICD-10-CM | POA: Insufficient documentation

## 2018-05-14 DIAGNOSIS — Z3689 Encounter for other specified antenatal screening: Secondary | ICD-10-CM

## 2018-05-14 DIAGNOSIS — Z3A23 23 weeks gestation of pregnancy: Secondary | ICD-10-CM | POA: Diagnosis not present

## 2018-05-14 DIAGNOSIS — O24312 Unspecified pre-existing diabetes mellitus in pregnancy, second trimester: Principal | ICD-10-CM

## 2018-05-14 DIAGNOSIS — J45909 Unspecified asthma, uncomplicated: Secondary | ICD-10-CM | POA: Insufficient documentation

## 2018-05-14 DIAGNOSIS — O09292 Supervision of pregnancy with other poor reproductive or obstetric history, second trimester: Secondary | ICD-10-CM | POA: Diagnosis not present

## 2018-05-14 DIAGNOSIS — O99212 Obesity complicating pregnancy, second trimester: Secondary | ICD-10-CM | POA: Insufficient documentation

## 2018-05-14 DIAGNOSIS — O24012 Pre-existing diabetes mellitus, type 1, in pregnancy, second trimester: Secondary | ICD-10-CM | POA: Diagnosis not present

## 2018-05-14 DIAGNOSIS — IMO0001 Reserved for inherently not codable concepts without codable children: Secondary | ICD-10-CM

## 2018-05-14 DIAGNOSIS — Z794 Long term (current) use of insulin: Principal | ICD-10-CM

## 2018-05-23 ENCOUNTER — Encounter: Payer: Self-pay | Admitting: Obstetrics & Gynecology

## 2018-05-27 ENCOUNTER — Ambulatory Visit (INDEPENDENT_AMBULATORY_CARE_PROVIDER_SITE_OTHER): Payer: Medicaid Other | Admitting: Advanced Practice Midwife

## 2018-05-27 VITALS — BP 113/75 | HR 97 | Wt 230.2 lb

## 2018-05-27 DIAGNOSIS — E1065 Type 1 diabetes mellitus with hyperglycemia: Secondary | ICD-10-CM

## 2018-05-27 DIAGNOSIS — O24319 Unspecified pre-existing diabetes mellitus in pregnancy, unspecified trimester: Secondary | ICD-10-CM

## 2018-05-27 DIAGNOSIS — E108 Type 1 diabetes mellitus with unspecified complications: Secondary | ICD-10-CM

## 2018-05-27 DIAGNOSIS — O0992 Supervision of high risk pregnancy, unspecified, second trimester: Secondary | ICD-10-CM

## 2018-05-27 DIAGNOSIS — F4321 Adjustment disorder with depressed mood: Secondary | ICD-10-CM

## 2018-05-27 DIAGNOSIS — Z3A24 24 weeks gestation of pregnancy: Secondary | ICD-10-CM

## 2018-05-27 DIAGNOSIS — IMO0002 Reserved for concepts with insufficient information to code with codable children: Secondary | ICD-10-CM

## 2018-05-27 DIAGNOSIS — Z9889 Other specified postprocedural states: Secondary | ICD-10-CM

## 2018-05-27 DIAGNOSIS — O24312 Unspecified pre-existing diabetes mellitus in pregnancy, second trimester: Secondary | ICD-10-CM

## 2018-05-27 DIAGNOSIS — N871 Moderate cervical dysplasia: Secondary | ICD-10-CM

## 2018-05-27 DIAGNOSIS — O219 Vomiting of pregnancy, unspecified: Secondary | ICD-10-CM

## 2018-05-27 LAB — POCT URINALYSIS DIP (DEVICE)
Bilirubin Urine: NEGATIVE
Glucose, UA: 500 mg/dL — AB
HGB URINE DIPSTICK: NEGATIVE
Ketones, ur: 15 mg/dL — AB
LEUKOCYTES UA: NEGATIVE
Nitrite: NEGATIVE
Protein, ur: NEGATIVE mg/dL
SPECIFIC GRAVITY, URINE: 1.01 (ref 1.005–1.030)
Urobilinogen, UA: 0.2 mg/dL (ref 0.0–1.0)
pH: 6 (ref 5.0–8.0)

## 2018-05-27 MED ORDER — ONDANSETRON HCL 4 MG PO TABS: 4.0000 mg | ORAL_TABLET | Freq: Three times a day (TID) | ORAL | 3 refills | Status: DC | PRN

## 2018-05-27 MED ORDER — BUPROPION HCL ER (XL) 150 MG PO TB24: 150.0000 mg | ORAL_TABLET | Freq: Every day | ORAL | 3 refills | Status: DC

## 2018-05-27 NOTE — Progress Notes (Signed)
PRENATAL VISIT NOTE  Subjective:  Christie [name] is a 32 y.o. [phone] at [redacted]w[redacted]d being seen today for ongoing prenatal care.  She is currently monitored for the following issues for this high-risk pregnancy and has Type I diabetes mellitus with complication, uncontrolled (HCC); History of migraine during pregnancy; Dysplasia of cervix, high grade CIN 2; Asthma, mild intermittent; Adjustment disorder with depressed mood; Abnormal Papanicolaou smear of cervix with positive human papilloma virus (HPV) test; Back pain; History of loop electrical excision procedure (LEEP); Preexisting diabetes complicating pregnancy, antepartum; Supervision of high-risk pregnancy; and Alpha+ thalassemia trait on their problem list.  Patient reports worsening depression, wanting to sleep all the time. Denies SI/HI. Currently on Zoloft 100 mg QD. Doesn't feel like it helps. Had previously complained about decreased libido. Does not have mental healthcare provider. Does want to talk to [place] today.  Contractions: Irritability. Vag. Bleeding: None.  Movement: Present. Denies leaking of fluid.   Reports insulin pump was not working for several days. Was doing insulin injections, but admits to not checking CBGs or controlling her blood sugars well lately due to poor mood.   The following portions of the patient's history were reviewed and updated as appropriate: allergies, current medications, past family history, past medical history, past social history, past surgical history and problem list. Problem list updated.  Objective:   Vitals:   05/27/18 0930  BP: 113/75  Pulse: 97  Weight: 230 lb 3.2 oz (104.4 kg)    Fetal Status: Fetal Heart Rate (bpm): 147 Fundal Height: 27 cm Movement: Present     General:  Alert, oriented and cooperative. Patient is in no acute distress.  Skin: Skin is warm and dry. No rash noted.   Cardiovascular: Normal heart rate noted  Respiratory: Normal respiratory effort, no problems with  respiration noted  Abdomen: Soft, gravid, appropriate for gestational age.  Pain/Pressure: Present     Pelvic: Cervical exam deferred        Extremities: Normal range of motion.  Edema: None  Mental Status: Normal mood and affect. Normal behavior. Normal judgment and thought content.   Does not have CBGs with her, but reports that they are out of range. Insulin pump now working.    Assessment and Plan:  Pregnancy: [id number] at [redacted]w[redacted]d  1. Type I diabetes mellitus with complication, uncontrolled (HCC) - Insulin pump now working. Strongly urged pt to check and bring blood sugars to appts. Pt verbalized awareness of risks of uncontrolled blood sugar in pregnancy. - Growth [place] Nml 11/6. Next growth scheduled 12/4 - Nml fetal Echo - Get Hgb A1C w/ 28 weeks labs  2. Supervision of high risk pregnancy in second trimester   3. History of loop electrical excision procedure (LEEP) - CL 3.07 on 11/6  4. Dysplasia of cervix, high grade CIN 2   5. Preexisting diabetes complicating pregnancy, antepartum  6. Depression - Switch to Wellbutrin XL 150 mg PO QD. Per Up-to-Date taping off Zoloft not necessary when switching meds, but dicussed Sx that may occur, possible increased depression while adjusting meds. May need to taper Zoloft over 3-4 weeks if symptomatic.  - Encourged pt to consider counseling. Will have to go to mental health provider if further med adjustments are needed.   Preterm labor symptoms and general obstetric precautions including but not limited to vaginal bleeding, contractions, leaking of fluid and fetal movement were reviewed in detail with the patient. Please refer to After Visit Summary for other counseling recommendations.  Return in about  3 weeks (around 06/17/2018) for ROB/28 weeks labs.  Future Appointments  Date Time Provider Department Center  06/11/2018  9:45 AM WH-MFC US 5 WH-MFCUS MFC-US  06/11/2018  1:15 PM Allie Bossierove, Myra C, MD [facility] WOC  06/25/2018  1:15 PM Reva BoresPratt,  [facility]anya S[facility]    Dorathy KinsmanVirginia Yaslin Kirtley, [place]

## 2018-05-27 NOTE — Patient Instructions (Signed)
TDaP Vaccine Pregnancy Get the Whooping Cough Vaccine While You Are Pregnant (CDC)  It is important for women to get the whooping cough vaccine in the third trimester of each pregnancy. Vaccines are the best way to prevent this disease. There are 2 different whooping cough vaccines. Both vaccines combine protection against whooping cough, tetanus and diphtheria, but they are for different age groups: Tdap: for everyone 11 years or older, including pregnant women  DTaP: for children 2 months through 6 years of age  You need the whooping cough vaccine during each of your pregnancies The recommended time to get the shot is during your 27th through 36th week of pregnancy, preferably during the earlier part of this time period. The Centers for Disease Control and Prevention (CDC) recommends that pregnant women receive the whooping cough vaccine for adolescents and adults (called Tdap vaccine) during the third trimester of each pregnancy. The recommended time to get the shot is during your 27th through 36th week of pregnancy, preferably during the earlier part of this time period. This replaces the original recommendation that pregnant women get the vaccine only if they had not previously received it. The American College of Obstetricians and Gynecologists and the American College of Nurse-Midwives support this recommendation.  You should get the whooping cough vaccine while pregnant to pass protection to your baby frame support disabled and/or not supported in this browser  Learn why Laura decided to get the whooping cough vaccine in her 3rd trimester of pregnancy and how her baby girl was born with some protection against the disease. Also available on YouTube. After receiving the whooping cough vaccine, your body will create protective antibodies (proteins produced by the body to fight off diseases) and pass some of them to your baby before birth. These antibodies provide your baby some short-term  protection against whooping cough in early life. These antibodies can also protect your baby from some of the more serious complications that come along with whooping cough. Your protective antibodies are at their highest about 2 weeks after getting the vaccine, but it takes time to pass them to your baby. So the preferred time to get the whooping cough vaccine is early in your third trimester. The amount of whooping cough antibodies in your body decreases over time. That is why CDC recommends you get a whooping cough vaccine during each pregnancy. Doing so allows each of your babies to get the greatest number of protective antibodies from you. This means each of your babies will get the best protection possible against this disease.  Getting the whooping cough vaccine while pregnant is better than getting the vaccine after you give birth Whooping cough vaccination during pregnancy is ideal so your baby will have short-term protection as soon as he is born. This early protection is important because your baby will not start getting his whooping cough vaccines until he is 2 months old. These first few months of life are when your baby is at greatest risk for catching whooping cough. This is also when he's at greatest risk for having severe, potentially life-threating complications from the infection. To avoid that gap in protection, it is best to get a whooping cough vaccine during pregnancy. You will then pass protection to your baby before he is born. To continue protecting your baby, he should get whooping cough vaccines starting at 2 months old. You may never have gotten the Tdap vaccine before and did not get it during this pregnancy. If so, you should make sure   to get the vaccine immediately after you give birth, before leaving the hospital or birthing center. It will take about 2 weeks before your body develops protection (antibodies) in response to the vaccine. Once you have protection from the vaccine,  you are less likely to give whooping cough to your newborn while caring for him. But remember, your baby will still be at risk for catching whooping cough from others. A recent study looked to see how effective Tdap was at preventing whooping cough in babies whose mothers got the vaccine while pregnant or in the hospital after giving birth. The study found that getting Tdap between 27 through 36 weeks of pregnancy is 85% more effective at preventing whooping cough in babies younger than 2 months old. Blood tests cannot tell if you need a whooping cough vaccine There are no blood tests that can tell you if you have enough antibodies in your body to protect yourself or your baby against whooping cough. Even if you have been sick with whooping cough in the past or previously received the vaccine, you still should get the vaccine during each pregnancy. Breastfeeding may pass some protective antibodies onto your baby By breastfeeding, you may pass some antibodies you have made in response to the vaccine to your baby. When you get a whooping cough vaccine during your pregnancy, you will have antibodies in your breast milk that you can share with your baby as soon as your milk comes in. However, your baby will not get protective antibodies immediately if you wait to get the whooping cough vaccine until after delivering your baby. This is because it takes about 2 weeks for your body to create antibodies. Learn more about the health benefits of breastfeeding.   Preterm Labor and Birth Information The normal length of a pregnancy is 39-41 weeks. Preterm labor is when labor starts before 37 completed weeks of pregnancy. What are the risk factors for preterm labor? Preterm labor is more likely to occur in women who:  Have certain infections during pregnancy such as a bladder infection, sexually transmitted infection, or infection inside the uterus (chorioamnionitis).  Have a shorter-than-normal cervix.  Have  gone into preterm labor before.  Have had surgery on their cervix.  Are younger than age 30 or older than age 135.  Are African American.  Are pregnant with twins or multiple babies (multiple gestation).  Take street drugs or smoke while pregnant.  Do not gain enough weight while pregnant.  Became pregnant shortly after having been pregnant.  What are the symptoms of preterm labor? Symptoms of preterm labor include:  Cramps similar to those that can happen during a menstrual period. The cramps may happen with diarrhea.  Pain in the abdomen or lower back.  Regular uterine contractions that may feel like tightening of the abdomen.  A feeling of increased pressure in the pelvis.  Increased watery or bloody mucus discharge from the vagina.  Water breaking (ruptured amniotic sac).  Why is it important to recognize signs of preterm labor? It is important to recognize signs of preterm labor because babies who are born prematurely may not be fully developed. This can put them at an increased risk for:  Long-term (chronic) heart and lung problems.  Difficulty immediately after birth with regulating body systems, including blood sugar, body temperature, heart rate, and breathing rate.  Bleeding in the brain.  Cerebral palsy.  Learning difficulties.  Death.  These risks are highest for babies who are born before 34 weeks of  pregnancy. How is preterm labor treated? Treatment depends on the length of your pregnancy, your condition, and the health of your baby. It may involve:  Having a stitch (suture) placed in your cervix to prevent your cervix from opening too early (cerclage).  Taking or being given medicines, such as: ? Hormone medicines. These may be given early in pregnancy to help support the pregnancy. ? Medicine to stop contractions. ? Medicines to help mature the baby's lungs. These may be prescribed if the risk of delivery is high. ? Medicines to prevent your baby  from developing cerebral palsy.  If the labor happens before 34 weeks of pregnancy, you may need to stay in the hospital. What should I do if I think I am in preterm labor? If you think that you are going into preterm labor, call your health care provider right away. How can I prevent preterm labor in future pregnancies? To increase your chance of having a full-term pregnancy:  Do not use any tobacco products, such as cigarettes, chewing tobacco, and e-cigarettes. If you need help quitting, ask your health care provider.  Do not use street drugs or medicines that have not been prescribed to you during your pregnancy.  Talk with your health care provider before taking any herbal supplements, even if you have been taking them regularly.  Make sure you gain a healthy amount of weight during your pregnancy.  Watch for infection. If you think that you might have an infection, get it checked right away.  Make sure to tell your health care provider if you have gone into preterm labor before.  This information is not intended to replace advice given to you by your health care provider. Make sure you discuss any questions you have with your health care provider. Document Released: 09/15/2003 Document Revised: 12/06/2015 Document Reviewed: 11/16/2015 Elsevier Interactive Patient Education  2018 [employer].

## 2018-06-03 ENCOUNTER — Ambulatory Visit (INDEPENDENT_AMBULATORY_CARE_PROVIDER_SITE_OTHER): Payer: Medicaid Other | Admitting: General Practice

## 2018-06-03 DIAGNOSIS — R350 Frequency of micturition: Secondary | ICD-10-CM | POA: Diagnosis not present

## 2018-06-03 LAB — POCT URINALYSIS DIP (DEVICE)
Bilirubin Urine: NEGATIVE
GLUCOSE, UA: 500 mg/dL — AB
Hgb urine dipstick: NEGATIVE
Ketones, ur: NEGATIVE mg/dL
LEUKOCYTES UA: NEGATIVE
NITRITE: NEGATIVE
PROTEIN: NEGATIVE mg/dL
Urobilinogen, UA: 0.2 mg/dL (ref 0.0–1.0)
pH: 5 (ref 5.0–8.0)

## 2018-06-03 NOTE — Progress Notes (Signed)
Patient came by office and dropped off urine sample for UA. UA negative.  Called patient and she reports increased urination for the past several days and burning in her back. She wanted to rule out UTI. Informed patient of negative UA and to follow up as needed. Patient verbalized understanding & had no questions.

## 2018-06-05 NOTE — Progress Notes (Signed)
I reviewed the note and agree with the nursing assessment and plan.   Hermione Havlicek, CNM 10/04/2017 10:32 AM   

## 2018-06-11 ENCOUNTER — Encounter: Payer: Self-pay | Admitting: Obstetrics & Gynecology

## 2018-06-11 ENCOUNTER — Ambulatory Visit (HOSPITAL_COMMUNITY)
Admission: RE | Admit: 2018-06-11 | Discharge: 2018-06-11 | Disposition: A | Discharge status: Home / Self Care | Payer: Medicaid Other | Source: Ambulatory Visit | Attending: Obstetrics & Gynecology | Admitting: Obstetrics & Gynecology

## 2018-06-13 ENCOUNTER — Other Ambulatory Visit: Payer: Self-pay

## 2018-06-13 ENCOUNTER — Inpatient Hospital Stay (HOSPITAL_COMMUNITY)
Admission: AD | Admit: 2018-06-13 | Discharge: 2018-06-14 | Disposition: A | Discharge status: Home / Self Care | Payer: Medicaid Other | Source: Ambulatory Visit | Attending: Obstetrics and Gynecology | Admitting: Obstetrics and Gynecology

## 2018-06-13 ENCOUNTER — Inpatient Hospital Stay (HOSPITAL_COMMUNITY): Payer: Medicaid Other

## 2018-06-13 ENCOUNTER — Encounter: Payer: Self-pay | Admitting: Obstetrics & Gynecology

## 2018-06-13 ENCOUNTER — Encounter (HOSPITAL_COMMUNITY): Payer: Self-pay

## 2018-06-13 DIAGNOSIS — J4520 Mild intermittent asthma, uncomplicated: Secondary | ICD-10-CM | POA: Insufficient documentation

## 2018-06-13 DIAGNOSIS — R0602 Shortness of breath: Secondary | ICD-10-CM | POA: Diagnosis present

## 2018-06-13 DIAGNOSIS — J45909 Unspecified asthma, uncomplicated: Secondary | ICD-10-CM

## 2018-06-13 DIAGNOSIS — O99512 Diseases of the respiratory system complicating pregnancy, second trimester: Secondary | ICD-10-CM | POA: Insufficient documentation

## 2018-06-13 DIAGNOSIS — Z3A27 27 weeks gestation of pregnancy: Secondary | ICD-10-CM

## 2018-06-13 DIAGNOSIS — O99519 Diseases of the respiratory system complicating pregnancy, unspecified trimester: Secondary | ICD-10-CM

## 2018-06-13 DIAGNOSIS — O99513 Diseases of the respiratory system complicating pregnancy, third trimester: Secondary | ICD-10-CM | POA: Diagnosis not present

## 2018-06-13 LAB — CBC
HEMATOCRIT: 37.4 % (ref 36.0–46.0)
Hemoglobin: 12.2 g/dL (ref 12.0–15.0)
MCH: 26.1 pg (ref 26.0–34.0)
MCHC: 32.6 g/dL (ref 30.0–36.0)
MCV: 79.9 fL — ABNORMAL LOW (ref 80.0–100.0)
Platelets: 163 10*3/uL (ref 150–400)
RBC: 4.68 MIL/uL (ref 3.87–5.11)
RDW: 14.9 % (ref 11.5–15.5)
WBC: 5.4 10*3/uL (ref 4.0–10.5)
nRBC: 0 % (ref 0.0–0.2)

## 2018-06-13 LAB — URINALYSIS, ROUTINE W REFLEX MICROSCOPIC
Bacteria, UA: NONE SEEN
Bilirubin Urine: NEGATIVE
Glucose, UA: >=500 mg/dL — AB
Hgb urine dipstick: NEGATIVE
Ketones, ur: NEGATIVE mg/dL
Leukocytes, UA: NEGATIVE
Nitrite: NEGATIVE
Protein, ur: NEGATIVE mg/dL
Specific Gravity, Urine: 1.032 — ABNORMAL HIGH (ref 1.005–1.030)
pH: 6 (ref 5.0–8.0)

## 2018-06-13 LAB — COMPREHENSIVE METABOLIC PANEL
ALBUMIN: 2.6 g/dL — AB (ref 3.5–5.0)
ALT: 13 U/L (ref 0–44)
ANION GAP: 9 (ref 5–15)
AST: 19 U/L (ref 15–41)
Alkaline Phosphatase: 46 U/L (ref 38–126)
BILIRUBIN TOTAL: 0.6 mg/dL (ref 0.3–1.2)
BUN: 9 mg/dL (ref 6–20)
CO2: 22 mmol/L (ref 22–32)
Calcium: 8.7 mg/dL — ABNORMAL LOW (ref 8.9–10.3)
Chloride: 100 mmol/L (ref 98–111)
Creatinine, Ser: 0.74 mg/dL (ref 0.44–1.00)
GFR calc non Af Amer: >60 mL/min (ref >60)
GLUCOSE: 253 mg/dL — AB (ref 70–99)
POTASSIUM: 3.5 mmol/L (ref 3.5–5.1)
Sodium: 131 mmol/L — ABNORMAL LOW (ref 135–145)
TOTAL PROTEIN: 6.7 g/dL (ref 6.5–8.1)

## 2018-06-13 MED ORDER — IOPAMIDOL (ISOVUE-370) INJECTION 76%
100.0000 mL | Freq: Once | INTRAVENOUS | Status: AC | PRN
  Administered 2018-06-13: 100 mL via INTRAVENOUS

## 2018-06-13 NOTE — MAU Provider Note (Signed)
Obstetric Attending MAU Note  Chief Complaint:  Shortness of Breath   First Provider Initiated Contact with Patient 06/13/18 2003     HPI: Christie [name] is a 32 y.o. [id number] at [redacted]w[redacted]d who presents to maternity admissions reporting One week h/o SOB with exertion. Not sleeping well. Naps only. Has trouble with lying flat and getting her breath. Gets nauseous when lying flat. Type 1 diabetes x 6 years. Normal eye exam this year. CBGs have not been tested in 1 week. Notes a ton of nausea with dry heaving and not throwing up any food. Has Zofran but that does not really help. Promethazine makes dry heaving worse. Denies contractions, leakage of fluid or vaginal bleeding. Good fetal movement.   Pregnancy Course: Receives care at [facility] Patient Active Problem List   Diagnosis Date Noted  . 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 Preexisting diabetes complicating pregnancy, antepartum 02/11/2018  . Supervision of high-risk pregnancy 02/11/2018  . History of loop electrical excision procedure (LEEP) 09/23/2017  . Back pain 04/27/2016  . Abnormal Papanicolaou smear of cervix with positive human papilloma virus (HPV) test 09/02/2015  . Adjustment disorder with depressed mood 08/03/2015  . 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 Asthma, mild intermittent 07/06/2013  . Type I diabetes mellitus with complication, uncontrolled (HCC) 06/18/2011  . History of migraine during pregnancy 06/18/2011  . Migraine 06/18/2011    Past Medical History:  Diagnosis Date  . Abnormal Pap smear    colpo scheduled  . Alpha thalassemia trait   . Anemia   . Anxiety   . Asthma   . Bipolar 1 disorder (HCC)   . Chlamydia   . Depression   . Diabetes mellitus    type 1 on insulin  . Fx ankle    history of left ankle fx at age 72  . Headache(784.0)   . Hx of migraines   . Pregnancy induced hypertension   . Urinary tract infection     OB History  Gravida Para Term Preterm AB Living  4 2 2  0 1 2   SAB TAB Ectopic Multiple Live Births  0 1 0 0 2    # Outcome Date GA Lbr Len/2nd Weight Sex Delivery Anes PTL Lv  4 Current           3 Term 11/04/12    M Vag-Spont   LIV  2 TAB 12/27/11 [redacted]w[redacted]d            Birth Comments: no complications  1 Term 09/12/07   4252 g M Vag-Spont EPI N LIV     Birth Comments: pre eclampsia    Past Surgical History:  Procedure Laterality Date  . COLPOSCOPY    . 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 WISDOM TOOTH EXTRACTION      Family History: Family History  Problem Relation Age of Onset  . Asthma Mother   . Diabetes Mother   . Hypertension Mother   . Asthma Father   . Hypertension Sister   . Diabetes Maternal Grandmother   . Cancer Maternal Grandmother        breast  . Obesity Other   . Sleep apnea Other   . Anesthesia problems Neg Hx     Social History: Social History   Tobacco Use  . Smoking status: Never Smoker  . Smokeless tobacco: Never Used  Substance Use Topics  . Alcohol use: Not Currently    Comment: social drinker  .  Drug use: No    Comment: last used 1 years ago    Allergies:  Allergies  Allergen Reactions  . Banana Anaphylaxis  . Citrus Other (See Comments)    Gums bleed  . Adhesive [Tape] Rash    Medications Prior to Admission  Medication Sig Dispense Refill Last Dose  . acetaminophen (TYLENOL) 500 MG tablet Take 500 mg by mouth every 6 (six) hours as needed.   Taking  . aspirin EC 81 MG tablet Take 1 tablet (81 mg total) by mouth daily. Take after 12 weeks for prevention of preeclampsia later in pregnancy (Patient not taking: Reported on 05/27/2018) 300 tablet 2 Not Taking  . buPROPion (WELLBUTRIN XL) 150 MG 24 hr tablet Take 1 tablet (150 mg total) by mouth daily. 30 tablet 3   . Butalbital-APAP-Caffeine 50-325-40 MG capsule Take 1-2 capsules by mouth every 6 (six) hours as needed for headache. (Patient not taking: Reported on 05/27/2018) 30 capsule 3 Not Taking  . glucagon (GLUCAGON EMERGENCY) 1 MG injection  Inject 1 mg into the muscle once as needed. (Patient not taking: Reported on 03/19/2018) 1 each 12 Not Taking  . insulin aspart (NOVOLOG) 100 UNIT/ML injection Inject 25 Units into the skin 3 (three) times daily before meals. Patient will need an appointment for further refills. 10 mL 1 Taking  . insulin glargine (LANTUS) 100 UNIT/ML injection Inject 0.35 mLs (35 Units total) into the skin daily with breakfast. Patient need an appointment for further refills. (Patient not taking: Reported on 04/09/2018) 10 mL 1 Not Taking  . Insulin Human (INSULIN PUMP) SOLN Inject 100 each into the skin continuous.    Taking  . Insulin Pen Needle 31G X 8 MM MISC As needed for insulin injections (Patient not taking: Reported on 04/09/2018) 100 each 1 Not Taking  . metoCLOPramide (REGLAN) 10 MG tablet Take 1 tablet (10 mg total) by mouth 4 (four) times daily as needed for nausea or vomiting. (Patient not taking: Reported on 05/27/2018) 30 tablet 2 Not Taking  . ondansetron (ZOFRAN) 4 MG tablet Take 1 tablet (4 mg total) by mouth every 8 (eight) hours as needed for nausea or vomiting. 30 tablet 3   . Prenatal Vit-Fe Fumarate-FA (PRENATAL MULTIVITAMIN) TABS tablet Take 1 tablet by mouth daily at 12 noon.   Taking  . promethazine (PHENERGAN) 25 MG tablet Take 1 tablet (25 mg total) by mouth every 6 (six) hours as needed for nausea or vomiting. 30 tablet 2 Taking  . sertraline (ZOLOFT) 100 MG tablet Take 1 tablet (100 mg total) by mouth daily. 30 tablet 4 Taking    ROS: Pertinent findings in history of present illness.  Physical Exam  Blood pressure 112/78, pulse (!) 102, temperature 97.6 F (36.4 C), resp. rate (!) 22, height 5\' 9"  (1.753 m), weight 108.4 kg, last menstrual period 12/04/2017, SpO2 100 %, unknown if currently breastfeeding. CONSTITUTIONAL: Well-developed, well-nourished female in no acute distress.  HENT:  Normocephalic, atraumatic, External right and left ear normal. Oropharynx is clear and moist EYES:  Conjunctivae and EOM are normal.No scleral icterus.  NECK: Normal range of motion, supple, no masses SKIN: Skin is warm and dry. No rash noted. Not diaphoretic. No erythema. No pallor. NEUROLGIC: Alert and oriented to person, place, and time. PSYCHIATRIC: Normal mood and affect. Normal behavior. Normal judgment and thought content. CARDIOVASCULAR: Normal heart rate noted, regular rhythm RESPIRATORY: Effort and breath sounds normal, no problems with respiration noted. Clear to auscultation ABDOMEN: Soft, nontender, nondistended, gravid appropriate for  gestational age MUSCULOSKELETAL: Normal range of motion. No edema and no tenderness. 2+ distal pulses.     FHT:  Baseline 135-150, wandering baseline , moderate variability, accelerations present, no decelerations Toco: quie   Labs: Results for orders placed or performed during the hospital encounter of 06/13/18 (from the past 24 hour(s))  Urinalysis, Routine w reflex microscopic     Status: Abnormal   Collection Time: 06/13/18  7:40 PM  Result Value Ref Range   Color, Urine STRAW (A) YELLOW   APPearance CLEAR CLEAR   Specific Gravity, Urine 1.032 (H) 1.005 - 1.030   pH 6.0 5.0 - 8.0   Glucose, UA >=500 (A) NEGATIVE mg/dL   Hgb urine dipstick NEGATIVE NEGATIVE   Bilirubin Urine NEGATIVE NEGATIVE   Ketones, ur NEGATIVE NEGATIVE mg/dL   Protein, ur NEGATIVE NEGATIVE mg/dL   Nitrite NEGATIVE NEGATIVE   Leukocytes, UA NEGATIVE NEGATIVE   WBC, UA 0-5 0 - 5 WBC/hpf   Bacteria, UA NONE SEEN NONE SEEN   Squamous Epithelial / LPF 0-5 0 - 5  CBC     Status: Abnormal   Collection Time: 06/13/18  8:30 PM  Result Value Ref Range   WBC 5.4 4.0 - 10.5 K/uL   RBC 4.68 3.87 - 5.11 MIL/uL   Hemoglobin 12.2 12.0 - 15.0 g/dL   HCT 82.9 56.2 - 13.0 %   MCV 79.9 (L) 80.0 - 100.0 fL   MCH 26.1 26.0 - 34.0 pg   MCHC 32.6 30.0 - 36.0 g/dL   RDW 86.5 78.4 - 69.6 %   Platelets 163 150 - 400 K/uL   nRBC 0.0 0.0 - 0.2 %  Comprehensive metabolic panel      Status: Abnormal   Collection Time: 06/13/18  8:30 PM  Result Value Ref Range   Sodium 131 (L) 135 - 145 mmol/L   Potassium 3.5 3.5 - 5.1 mmol/L   Chloride 100 98 - 111 mmol/L   CO2 22 22 - 32 mmol/L   Glucose, Bld 253 (H) 70 - 99 mg/dL   BUN 9 6 - 20 mg/dL   Creatinine, Ser 2.95 0.44 - 1.00 mg/dL   Calcium 8.7 (L) 8.9 - 10.3 mg/dL   Total Protein 6.7 6.5 - 8.1 g/dL   Albumin 2.6 (L) 3.5 - 5.0 g/dL   AST 19 15 - 41 U/L   ALT 13 0 - 44 U/L   Alkaline Phosphatase 46 38 - 126 U/L   Total Bilirubin 0.6 0.3 - 1.2 mg/dL   GFR calc non Af Amer >60 >60 mL/min   GFR calc Af Amer >60 >60 mL/min   Anion gap 9 5 - 15    Imaging:  Ct Angio Chest Pe W And/or Wo Contrast  Result Date: 06/13/2018 CLINICAL DATA:  32 year old female with shortness of breath. History of asthma. EXAM: CT ANGIOGRAPHY CHEST WITH CONTRAST TECHNIQUE: Multidetector CT imaging of the chest was performed using the standard protocol during bolus administration of intravenous contrast. Multiplanar CT image reconstructions and MIPs were obtained to evaluate the vascular anatomy. CONTRAST:  ISOVUE-370 IOPAMIDOL (ISOVUE-370) INJECTION 76% COMPARISON:  Chest radiograph dated 07/08/2008 FINDINGS: Cardiovascular: There is no cardiomegaly or pericardial effusion. The thoracic aorta is unremarkable. Mildly dilated main pulmonary trunk may represent a degree of pulmonary hypertension. Evaluation of the pulmonary arteries is somewhat limited due to suboptimal opacification and timing of the contrast. No central pulmonary artery embolus identified. Mediastinum/Nodes: There is no hilar or mediastinal adenopathy. Esophagus is grossly unremarkable. No mediastinal fluid  collection. Lungs/Pleura: The lungs are clear. There is no pleural effusion or pneumothorax. The central airways are patent. Upper Abdomen: No acute abnormality. Musculoskeletal: No chest wall abnormality. No acute or significant osseous findings. Review of the MIP images  confirms the above findings. IMPRESSION: No acute intrathoracic pathology. No CT evidence of central pulmonary artery embolus. Electronically Signed   By: Elgie Collard M.D.   On: 06/13/2018 23:54    MAU Course: O2 sats are 100% at rest on RA, but increased WOB with any movement and sats drop to 82% with ambulation.  Assumed care from Dr. Shawnie Pons at 2140.  Negative CT scan. Patient reports improvement of her symptoms.  After evaluation, patient informs CNM that she has asthma and does not have an inhaler because she hasn't needed one in 6 years. Lengthy discussion of pregnancy worsening asthma. Will refill inhaler.  Assessment: 1. Asthma during pregnancy   2. [redacted] weeks gestation of pregnancy     Plan:  -Discharge home in stable condition -Rx for albuterol inhaler given to patient -Asthma exacerbation precautions discussed -Patient advised to follow-up with [facility] as scheduled for prenatal care -Patient may return to MAU as needed or if her condition were to change or worsen  Follow-up Information    [facility] [place] Follow up.   Why:  As scheduled for prenatal care Contact information: [address] [place] [address] [phone]          Allergies as of 06/14/2018      Reactions   Banana Anaphylaxis   Citrus Other (See Comments)   Gums bleed   Adhesive [tape] Rash      Medication List    TAKE these medications   acetaminophen 500 MG tablet Commonly known as:  TYLENOL Take 500 mg by mouth every 6 (six) hours as needed.   albuterol 108 (90 Base) MCG/ACT inhaler Commonly known as:  PROVENTIL HFA;VENTOLIN HFA Inhale 1-2 puffs into the lungs every 6 (six) hours as needed for wheezing or shortness of breath.   aspirin EC 81 MG tablet Take 1 tablet (81 mg total) by mouth daily. Take after 12 weeks for prevention of preeclampsia later in pregnancy   buPROPion 150 MG 24 hr tablet Commonly known as:  WELLBUTRIN XL Take 1 tablet (150  mg total) by mouth daily.   Butalbital-APAP-Caffeine 50-325-40 MG capsule Take 1-2 capsules by mouth every 6 (six) hours as needed for headache.   glucagon 1 MG injection Inject 1 mg into the muscle once as needed.   insulin aspart 100 UNIT/ML injection Commonly known as:  novoLOG Inject 25 Units into the skin 3 (three) times daily before meals. Patient will need an appointment for further refills.   insulin glargine 100 UNIT/ML injection Commonly known as:  LANTUS Inject 0.35 mLs (35 Units total) into the skin daily with breakfast. Patient need an appointment for further refills.   Insulin Pen Needle 31G X 8 MM Misc As needed for insulin injections   insulin pump Soln Inject 100 each into the skin continuous.   metoCLOPramide 10 MG tablet Commonly known as:  REGLAN Take 1 tablet (10 mg total) by mouth 4 (four) times daily as needed for nausea or vomiting.   ondansetron 4 MG tablet Commonly known as:  ZOFRAN Take 1 tablet (4 mg total) by mouth every 8 (eight) hours as needed for nausea or vomiting.   prenatal multivitamin Tabs tablet Take 1 tablet by mouth daily at 12 noon.   promethazine  25 MG tablet Commonly known as:  PHENERGAN Take 1 tablet (25 mg total) by mouth every 6 (six) hours as needed for nausea or vomiting.   sertraline 100 MG tablet Commonly known as:  ZOLOFT Take 1 tablet (100 mg total) by mouth daily.       Rolm Bookbinder, [place] 06/14/2018 12:10 AM

## 2018-06-13 NOTE — MAU Note (Signed)
Pt with IV inserted in right AC; saline lock. Blood return noted. During NS flush, pt started to experience "burning sensation". No infiltration noted. Pt with small reddened, circular area above IV site; tender to touch. NS flush d/c and saline locked. Pt denied any further burning sensation or tenderness after NS flush was d/c. Pt denies an allergy to NS.   Adah Perlhandra Quitman Norberto RN

## 2018-06-13 NOTE — MAU Note (Signed)
Had been SOB for a wk. Called clinic today and told to come here. Feels tight like fighting to get air in. Hx asthma but has not had to use inhaler for 5328yrs. Not like wheezing. Just tight like cannot get breath. Get exhausted when walking. Some round ligament pain and states having some ctxs.

## 2018-06-13 NOTE — MAU Note (Signed)
Pt with O2 sats ranging from 90-94% with ambulation in the hallway for approx 6 min in the hallway. Pt stated,"O2 sats went to 82% after I went into bathroom". Pt states," I feel like I ran a marathon". Dr. Shawnie PonsPratt aware.    Adah Perlhandra Isabella Ida RN

## 2018-06-14 DIAGNOSIS — J45909 Unspecified asthma, uncomplicated: Secondary | ICD-10-CM

## 2018-06-14 DIAGNOSIS — Z3A27 27 weeks gestation of pregnancy: Secondary | ICD-10-CM

## 2018-06-14 DIAGNOSIS — O99513 Diseases of the respiratory system complicating pregnancy, third trimester: Secondary | ICD-10-CM

## 2018-06-14 MED ORDER — ALBUTEROL SULFATE HFA 108 (90 BASE) MCG/ACT IN AERS: 1.0000 | INHALATION_SPRAY | Freq: Four times a day (QID) | RESPIRATORY_TRACT | 0 refills | Status: DC | PRN

## 2018-06-14 NOTE — Discharge Instructions (Signed)
Asthma, Adult °Asthma is a recurring condition in which the airways tighten and narrow. Asthma can make it difficult to breathe. It can cause coughing, wheezing, and shortness of breath. Asthma episodes, also called asthma attacks, range from minor to life-threatening. Asthma cannot be cured, but medicines and lifestyle changes can help control it. °What are the causes? °Asthma is believed to be caused by inherited (genetic) and environmental factors, but its exact cause is unknown. Asthma may be triggered by allergens, lung infections, or irritants in the air. Asthma triggers are different for each person. Common triggers include: °· Animal dander. °· Dust mites. °· Cockroaches. °· Pollen from trees or grass. °· Mold. °· Smoke. °· Air pollutants such as dust, household cleaners, hair sprays, aerosol sprays, paint fumes, strong chemicals, or strong odors. °· Cold air, weather changes, and winds (which increase molds and pollens in the air). °· Strong emotional expressions such as crying or laughing hard. °· Stress. °· Certain medicines (such as aspirin) or types of drugs (such as beta-blockers). °· Sulfites in foods and drinks. Foods and drinks that may contain sulfites include dried fruit, potato chips, and sparkling grape juice. °· Infections or inflammatory conditions such as the flu, a cold, or an inflammation of the nasal membranes (rhinitis). °· Gastroesophageal reflux disease (GERD). °· Exercise or strenuous activity. ° °What are the signs or symptoms? °Symptoms may occur immediately after asthma is triggered or many hours later. Symptoms include: °· Wheezing. °· Excessive nighttime or early morning coughing. °· Frequent or severe coughing with a common cold. °· Chest tightness. °· Shortness of breath. ° °How is this diagnosed? °The diagnosis of asthma is made by a review of your medical history and a physical exam. Tests may also be performed. These may include: °· Lung function studies. These tests show how  much air you breathe in and out. °· Allergy tests. °· Imaging tests such as X-rays. ° °How is this treated? °Asthma cannot be cured, but it can usually be controlled. Treatment involves identifying and avoiding your asthma triggers. It also involves medicines. There are 2 classes of medicine used for asthma treatment: °· Controller medicines. These prevent asthma symptoms from occurring. They are usually taken every day. °· Reliever or rescue medicines. These quickly relieve asthma symptoms. They are used as needed and provide short-term relief. ° °Your health care provider will help you create an asthma action plan. An asthma action plan is a written plan for managing and treating your asthma attacks. It includes a list of your asthma triggers and how they may be avoided. It also includes information on when medicines should be taken and when their dosage should be changed. An action plan may also involve the use of a device called a peak flow meter. A peak flow meter measures how well the lungs are working. It helps you monitor your condition. °Follow these instructions at home: °· Take medicines only as directed by your health care provider. Speak with your health care provider if you have questions about how or when to take the medicines. °· Use a peak flow meter as directed by your health care provider. Record and keep track of readings. °· Understand and use the action plan to help minimize or stop an asthma attack without needing to seek medical care. °· Control your home environment in the following ways to help prevent asthma attacks: °? Do not smoke. Avoid being exposed to secondhand smoke. °? Change your heating and air conditioning filter regularly. °? Limit   your use of fireplaces and wood stoves. ? Get rid of pests (such as roaches and mice) and their droppings. ? Throw away plants if you see mold on them. ? Clean your floors and dust regularly. Use unscented cleaning products. ? Try to have someone  else vacuum for you regularly. Stay out of rooms while they are being vacuumed and for a short while afterward. If you vacuum, use a dust mask from a hardware store, a double-layered or microfilter vacuum cleaner bag, or a vacuum cleaner with a HEPA filter. ? Replace carpet with wood, tile, or vinyl flooring. Carpet can trap dander and dust. ? Use allergy-proof pillows, mattress covers, and box spring covers. ? Wash bed sheets and blankets every week in hot water and dry them in a dryer. ? Use blankets that are made of polyester or cotton. ? Clean bathrooms and kitchens with bleach. If possible, have someone repaint the walls in these rooms with mold-resistant paint. Keep out of the rooms that are being cleaned and painted. ? Wash hands frequently. Contact a health care provider if:  You have wheezing, shortness of breath, or a cough even if taking medicine to prevent attacks.  The colored mucus you cough up (sputum) is thicker than usual.  Your sputum changes from clear or white to yellow, green, gray, or bloody.  You have any problems that may be related to the medicines you are taking (such as a rash, itching, swelling, or trouble breathing).  You are using a reliever medicine more than 2-3 times per week.  Your peak flow is still at 50-79% of your personal best after following your action plan for 1 hour.  You have a fever. Get help right away if:  You seem to be getting worse and are unresponsive to treatment during an asthma attack.  You are short of breath even at rest.  You get short of breath when doing very little physical activity.  You have difficulty eating, drinking, or talking due to asthma symptoms.  You develop chest pain.  You develop a fast heartbeat.  You have a bluish color to your lips or fingernails.  You are light-headed, dizzy, or faint.  Your peak flow is less than 50% of your personal best. This information is not intended to replace advice given to  you by your health care provider. Make sure you discuss any questions you have with your health care provider. Document Released: 06/25/2005 Document Revised: 12/07/2015 Document Reviewed: 01/22/2013 Elsevier Interactive Patient Education  2017 [employer]  Safe Medications in Pregnancy   Acne: Benzoyl Peroxide Salicylic Acid  Backache/Headache: Tylenol: 2 regular strength every 4 hours OR              2 Extra strength every 6 hours  Colds/Coughs/Allergies: Benadryl (alcohol free) 25 mg every 6 hours as needed Breath right strips Claritin Cepacol throat lozenges Chloraseptic throat spray Cold-Eeze- up to three times per day Cough drops, alcohol free Flonase (by prescription only) Guaifenesin Mucinex Robitussin DM (plain only, alcohol free) Saline nasal spray/drops Sudafed (pseudoephedrine) & Actifed ** use only after [redacted] weeks gestation and if you do not have high blood pressure Tylenol Vicks Vaporub Zinc lozenges Zyrtec   Constipation: Colace Ducolax suppositories Fleet enema Glycerin suppositories Metamucil Milk of magnesia Miralax Senokot Smooth move tea  Diarrhea: Kaopectate Imodium A-D  *NO pepto Bismol  Hemorrhoids: Anusol Anusol HC Preparation H Tucks  Indigestion: Tums Maalox Mylanta Zantac  Pepcid  Insomnia: Benadryl (alcohol free) 25mg  every 6  hours as needed Tylenol PM Unisom, no Gelcaps  Leg Cramps: Tums MagGel  Nausea/Vomiting:  Bonine Dramamine Emetrol Ginger extract Sea bands Meclizine  Nausea medication to take during pregnancy:  Unisom (doxylamine succinate 25 mg tablets) Take one tablet daily at bedtime. If symptoms are not adequately controlled, the dose can be increased to a maximum recommended dose of two tablets daily (1/2 tablet in the morning, 1/2 tablet mid-afternoon and one at bedtime). Vitamin B6 100mg  tablets. Take one tablet twice a day (up to 200 mg per day).  Skin Rashes: Aveeno products Benadryl  cream or 25mg  every 6 hours as needed Calamine Lotion 1% cortisone cream  Yeast infection: Gyne-lotrimin 7 Monistat 7   **If taking multiple medications, please check labels to avoid duplicating the same active ingredients **take medication as directed on the label ** Do not exceed 4000 mg of tylenol in 24 hours **Do not take medications that contain aspirin or ibuprofen

## 2018-06-18 ENCOUNTER — Ambulatory Visit (HOSPITAL_COMMUNITY): Payer: Medicaid Other

## 2018-06-24 ENCOUNTER — Other Ambulatory Visit (HOSPITAL_COMMUNITY): Payer: Self-pay | Admitting: *Deleted

## 2018-06-24 ENCOUNTER — Ambulatory Visit (HOSPITAL_COMMUNITY)
Admission: RE | Admit: 2018-06-24 | Discharge: 2018-06-24 | Disposition: A | Discharge status: Home / Self Care | Payer: Medicaid Other | Source: Ambulatory Visit | Attending: Maternal and Fetal Medicine | Admitting: Maternal and Fetal Medicine

## 2018-06-24 ENCOUNTER — Encounter (HOSPITAL_COMMUNITY): Payer: Self-pay

## 2018-06-24 ENCOUNTER — Other Ambulatory Visit (HOSPITAL_COMMUNITY): Payer: Self-pay | Admitting: Maternal and Fetal Medicine

## 2018-06-24 DIAGNOSIS — IMO0001 Reserved for inherently not codable concepts without codable children: Secondary | ICD-10-CM

## 2018-06-24 DIAGNOSIS — Z3A28 28 weeks gestation of pregnancy: Secondary | ICD-10-CM

## 2018-06-24 DIAGNOSIS — O09293 Supervision of pregnancy with other poor reproductive or obstetric history, third trimester: Secondary | ICD-10-CM | POA: Diagnosis not present

## 2018-06-24 DIAGNOSIS — Z362 Encounter for other antenatal screening follow-up: Secondary | ICD-10-CM | POA: Diagnosis not present

## 2018-06-24 DIAGNOSIS — Z794 Long term (current) use of insulin: Secondary | ICD-10-CM

## 2018-06-24 DIAGNOSIS — O24019 Pre-existing diabetes mellitus, type 1, in pregnancy, unspecified trimester: Secondary | ICD-10-CM

## 2018-06-24 DIAGNOSIS — O1402 Mild to moderate pre-eclampsia, second trimester: Secondary | ICD-10-CM | POA: Insufficient documentation

## 2018-06-24 DIAGNOSIS — O9989 Other specified diseases and conditions complicating pregnancy, childbirth and the puerperium: Secondary | ICD-10-CM | POA: Diagnosis not present

## 2018-06-24 DIAGNOSIS — Z3A27 27 weeks gestation of pregnancy: Secondary | ICD-10-CM | POA: Diagnosis not present

## 2018-06-24 DIAGNOSIS — O24312 Unspecified pre-existing diabetes mellitus in pregnancy, second trimester: Secondary | ICD-10-CM

## 2018-06-24 DIAGNOSIS — O24012 Pre-existing diabetes mellitus, type 1, in pregnancy, second trimester: Secondary | ICD-10-CM

## 2018-06-24 DIAGNOSIS — O99212 Obesity complicating pregnancy, second trimester: Secondary | ICD-10-CM

## 2018-06-25 ENCOUNTER — Ambulatory Visit (INDEPENDENT_AMBULATORY_CARE_PROVIDER_SITE_OTHER): Payer: Medicaid Other | Admitting: Family Medicine

## 2018-06-25 ENCOUNTER — Ambulatory Visit (INDEPENDENT_AMBULATORY_CARE_PROVIDER_SITE_OTHER): Payer: Medicaid Other | Admitting: Clinical

## 2018-06-25 VITALS — BP 113/74 | HR 84 | Wt 233.0 lb

## 2018-06-25 DIAGNOSIS — O24313 Unspecified pre-existing diabetes mellitus in pregnancy, third trimester: Secondary | ICD-10-CM

## 2018-06-25 DIAGNOSIS — Z23 Encounter for immunization: Secondary | ICD-10-CM | POA: Diagnosis not present

## 2018-06-25 DIAGNOSIS — F39 Unspecified mood [affective] disorder: Secondary | ICD-10-CM

## 2018-06-25 DIAGNOSIS — F332 Major depressive disorder, recurrent severe without psychotic features: Secondary | ICD-10-CM | POA: Diagnosis not present

## 2018-06-25 DIAGNOSIS — F419 Anxiety disorder, unspecified: Secondary | ICD-10-CM

## 2018-06-25 DIAGNOSIS — R0602 Shortness of breath: Secondary | ICD-10-CM | POA: Diagnosis not present

## 2018-06-25 DIAGNOSIS — F4321 Adjustment disorder with depressed mood: Secondary | ICD-10-CM | POA: Diagnosis not present

## 2018-06-25 DIAGNOSIS — O24319 Unspecified pre-existing diabetes mellitus in pregnancy, unspecified trimester: Secondary | ICD-10-CM

## 2018-06-25 DIAGNOSIS — O0993 Supervision of high risk pregnancy, unspecified, third trimester: Secondary | ICD-10-CM

## 2018-06-25 MED ORDER — ACCU-CHEK FASTCLIX LANCETS MISC: 1.0000 [IU] | Freq: Four times a day (QID) | 12 refills | Status: DC

## 2018-06-25 MED ORDER — BUPROPION HCL ER (XL) 300 MG PO TB24: 300.0000 mg | ORAL_TABLET | Freq: Every day | ORAL | 2 refills | Status: DC

## 2018-06-25 MED ORDER — BUSPIRONE HCL 7.5 MG PO TABS: 7.5000 mg | ORAL_TABLET | Freq: Two times a day (BID) | ORAL | 2 refills | Status: DC

## 2018-06-25 NOTE — Progress Notes (Signed)
PRENATAL VISIT NOTE  Subjective:  Christie [name] is a 32 y.o. [phone] at 4643w0d being seen today for ongoing prenatal care.  She is currently monitored for the following issues for this high-risk pregnancy and has Type I diabetes mellitus with complication, uncontrolled (HCC); History of migraine during pregnancy; Dysplasia of cervix, high grade CIN 2; Asthma, mild intermittent; Adjustment disorder with depressed mood; Abnormal Papanicolaou smear of cervix with positive human papilloma virus (HPV) test; Back pain; History of loop electrical excision procedure (LEEP); Preexisting diabetes complicating pregnancy, antepartum; Supervision of high-risk pregnancy; Alpha+ thalassemia trait; and Migraine on their problem list.  Patient reports anxiety, occasional suicidal thoughts. Recently switched to wellbutrin from Zoloft. Anxiety and panic attacks. Seen in MAU for SOB. Ruled out PE and noted to have significant drop in O2 sats with walking. Given inhaler, but no improvement with this..  Contractions: Irregular. Vag. Bleeding: None.  Movement: Present. Denies leaking of fluid.   The following portions of the patient's history were reviewed and updated as appropriate: allergies, current medications, past family history, past medical history, past social history, past surgical history and problem list. Problem list updated.  Objective:   Vitals:   06/25/18 1321  BP: 113/74  Pulse: 84  Weight: 233 lb (105.7 kg)    Fetal Status: Fetal Heart Rate (bpm): 150   Movement: Present     General:  Alert, oriented and cooperative. Patient is in no acute distress.  Skin: Skin is warm and dry. No rash noted.   Cardiovascular: Normal heart rate noted  Respiratory: Normal respiratory effort, no problems with respiration noted  Abdomen: Soft, gravid, appropriate for gestational age.  Pain/Pressure: Present     Pelvic: Cervical exam deferred        Extremities: Normal range of motion.  Edema: None  Mental  Status: Normal mood and affect. Normal behavior. Normal judgment and thought content.   Assessment and Plan:  Pregnancy: [id number] at 5643w0d  1. Preexisting diabetes complicating pregnancy, antepartum Not checking CBGs, re-emphasized importance of glycemic control, not only during pregnancy but for life long issues  - Hemoglobin A1c - ACCU-CHEK FASTCLIX LANCETS MISC; 1 Units by Percutaneous route 4 (four) times daily.  Dispense: 100 each; Refill: 12 - Tdap vaccine greater than or equal to 7yo IM  2. Supervision of high risk pregnancy in third trimester 28 wk labs today - HIV Antibody (routine testing w rflx) - RPR - Tdap vaccine greater than or equal to 7yo IM  3. Shortness of breath Will refer to pulm--will likely need PFT's. Does not sound cardiac, though that may be an issue. - Ambulatory referral to Pulmonology  4. Severe episode of recurrent major depressive disorder, without psychotic features (HCC) [facility] Asher MuirJamie [facility]. Increased Wellbutrin. Contracts for safety - buPROPion (WELLBUTRIN XL) 300 MG 24 hr tablet; Take 1 tablet (300 mg total) by mouth daily.  Dispense: 30 tablet; Refill: 2 - Ambulatory referral to Integrated Behavioral Health  5. Anxiety Begin Buspar to help with anxiety + [facility] - busPIRone (BUSPAR) 7.5 MG tablet; Take 1 tablet (7.5 mg total) by mouth 2 (two) times daily.  Dispense: 60 tablet; Refill: 2 - Ambulatory referral to Integrated Behavioral Health  Preterm labor symptoms and general obstetric precautions including but not limited to vaginal bleeding, contractions, leaking of fluid and fetal movement were reviewed in detail with the patient. Please refer to After Visit Summary for other counseling recommendations.  Return in 2  weeks (on 07/09/2018).  Future Appointments  Date Time Provider Department Center  07/01/2018 10:30 AM WH-MFC [place] 1 WH-MFCUS MFC-US  07/07/2018 10:45 AM WH-MFC [place] 5 WH-MFCUS MFC-US    07/14/2018  8:00 AM WH-MFC [place] 3 WH-MFCUS MFC-US  07/21/2018  8:00 AM WH-MFC [place] 3 WH-MFCUS MFC-US    Reva Bores, MD

## 2018-06-25 NOTE — Patient Instructions (Addendum)

## 2018-06-25 NOTE — BH Specialist Note (Signed)
Integrated Behavioral Health Initial Visit  MRN: [id number] Name: Christie [name]  Number of Integrated Behavioral Health Clinician visits:: 3/6 4 Total Session Start time: 2:14 Session End time: 2:30 Total time: 20 minutes  Type of Service: Integrated Behavioral Health- Individual/Family Interpretor:No. Interpretor Name and Language: n/a   Warm Hand Off Completed.       SUBJECTIVE: Christie [name] is a 32 y.o. female accompanied by n/a Patient was referred by Tinnie Gensanya Pratt, MD for depression. Patient reports the following symptoms/concerns: Pt states her primary concern today is back pain and nausea, causing her to feel highly irritable, and unable to sleep more than 2 hours, as back pain wakes her up. Pt attributes thoughts of "walking in front of traffic" to physical pain and sleep deprivation; goal is to have this baby as soon as possible, so she can get some sleep and eat a full meal. Pt denies intent of harm; says she wouldn't harm herself because "I don't want that for my kids".  Duration of problem: Ongoing; Severity of problem: severe  OBJECTIVE: Mood: Anxious, Depressed and Irritable and Affect: Depressed Risk of harm to self or others: Suicidal ideation No plan to harm self or others  LIFE CONTEXT: Family and Social: Pt lives with FOB and children(10,5) School/Work: - Self-Care: - Life Changes: Current pregnancy; increase in back pain and nausea   GOALS ADDRESSED: Patient will: 1. Reduce symptoms of: agitation, anxiety, depression and mood instability 2. Increase knowledge and/or ability of: coping skills  3. Demonstrate ability to: Increase motivation to adhere to plan of care  INTERVENTIONS: Interventions utilized: Solution-Focused Strategies  Standardized Assessments completed: Patient declined screening  ASSESSMENT: Patient currently experiencing Mood disorder, unspecified   Patient may benefit from safety planning and referral to psychiatry  today.Marland Kitchen.  PLAN: 1. Follow up with behavioral health clinician on : Two days via phone 2. Behavioral recommendations:  -Follow Safety plan -Take BH medication as prescribed by medical provider -[facility] Thursday, 06/26/18 -Continue plan with FOB to stay at hotel with pool this weekend; "float" in pool for back pain relief.  -Eat small meals throughout the day, as recommended by medical provider 3. Referral(s): [facility] (In Clinic) and [facility] 4. "From scale of 1-10, how likely are you to follow plan?": -  Rae LipsJamie C McMannes, LCSW  Depression screen [facility] 2/9 05/27/2018 05/07/2018 04/09/2018 03/12/2018 02/11/2018  Decreased Interest 2 2 1 1 3   Down, Depressed, Hopeless 2 1 1 1 3   PHQ - 2 Score 4 3 2 2 6   Altered sleeping 2 2 3 2 3   Tired, decreased energy 2 2 3 2 2   Change in appetite 2 2 3 1 2   Feeling bad or failure about yourself  3 1 0 2 2  Trouble concentrating 1 0 0 3 0  Moving slowly or fidgety/restless 0 0 0 0 0  Suicidal thoughts 1 1 0 0 0  PHQ-9 Score 15 11 11 12 15    GAD 7 : Generalized Anxiety Score 05/27/2018 05/07/2018 04/09/2018 03/12/2018  Nervous, Anxious, on Edge 1 0 0 1  Control/stop worrying 2 2 1 3   Worry too much - different things 2 2 1 3   Trouble relaxing 2 2 1 3   Restless 1 1 0 0  Easily annoyed or irritable 3 3 1 3   Afraid - awful might happen 1 0 0 0  Total GAD 7 Score 12 10 4  13

## 2018-06-26 LAB — RPR: RPR Ser Ql: NONREACTIVE

## 2018-06-26 LAB — HEMOGLOBIN A1C
Est. average glucose Bld gHb Est-mCnc: 306 mg/dL
Hgb A1c MFr Bld: 12.3 % — ABNORMAL HIGH (ref 4.8–5.6)

## 2018-06-26 LAB — HIV ANTIBODY (ROUTINE TESTING W REFLEX): HIV Screen 4th Generation wRfx: NONREACTIVE

## 2018-07-01 ENCOUNTER — Ambulatory Visit (HOSPITAL_COMMUNITY)
Admission: RE | Admit: 2018-07-01 | Discharge: 2018-07-01 | Disposition: A | Discharge status: Home / Self Care | Payer: Medicaid Other | Source: Ambulatory Visit | Attending: Family Medicine | Admitting: Family Medicine

## 2018-07-01 ENCOUNTER — Encounter (HOSPITAL_COMMUNITY): Payer: Self-pay

## 2018-07-01 DIAGNOSIS — J45909 Unspecified asthma, uncomplicated: Secondary | ICD-10-CM | POA: Diagnosis not present

## 2018-07-01 DIAGNOSIS — O09293 Supervision of pregnancy with other poor reproductive or obstetric history, third trimester: Secondary | ICD-10-CM | POA: Insufficient documentation

## 2018-07-01 DIAGNOSIS — Z3A29 29 weeks gestation of pregnancy: Secondary | ICD-10-CM | POA: Diagnosis not present

## 2018-07-01 DIAGNOSIS — O24019 Pre-existing diabetes mellitus, type 1, in pregnancy, unspecified trimester: Secondary | ICD-10-CM

## 2018-07-01 DIAGNOSIS — O24013 Pre-existing diabetes mellitus, type 1, in pregnancy, third trimester: Secondary | ICD-10-CM | POA: Insufficient documentation

## 2018-07-01 DIAGNOSIS — O99513 Diseases of the respiratory system complicating pregnancy, third trimester: Secondary | ICD-10-CM | POA: Insufficient documentation

## 2018-07-07 ENCOUNTER — Ambulatory Visit (INDEPENDENT_AMBULATORY_CARE_PROVIDER_SITE_OTHER): Payer: Medicaid Other

## 2018-07-07 ENCOUNTER — Encounter (HOSPITAL_COMMUNITY): Payer: Self-pay

## 2018-07-07 ENCOUNTER — Ambulatory Visit (HOSPITAL_COMMUNITY)
Admission: RE | Admit: 2018-07-07 | Discharge: 2018-07-07 | Disposition: A | Discharge status: Home / Self Care | Payer: Medicaid Other | Source: Ambulatory Visit | Attending: Family Medicine | Admitting: Family Medicine

## 2018-07-07 DIAGNOSIS — O09293 Supervision of pregnancy with other poor reproductive or obstetric history, third trimester: Secondary | ICD-10-CM | POA: Diagnosis not present

## 2018-07-07 DIAGNOSIS — Z23 Encounter for immunization: Secondary | ICD-10-CM

## 2018-07-07 DIAGNOSIS — O24019 Pre-existing diabetes mellitus, type 1, in pregnancy, unspecified trimester: Secondary | ICD-10-CM | POA: Diagnosis present

## 2018-07-07 DIAGNOSIS — O0993 Supervision of high risk pregnancy, unspecified, third trimester: Secondary | ICD-10-CM | POA: Diagnosis not present

## 2018-07-07 DIAGNOSIS — O24013 Pre-existing diabetes mellitus, type 1, in pregnancy, third trimester: Secondary | ICD-10-CM | POA: Diagnosis not present

## 2018-07-07 DIAGNOSIS — Z3A30 30 weeks gestation of pregnancy: Secondary | ICD-10-CM

## 2018-07-07 NOTE — Progress Notes (Signed)
Pt here for Flu Injection, Pt tolerated well.  Rinaldo Cloudamela [facility],CMA [facility] 07/07/18 @ 11:45A

## 2018-07-10 ENCOUNTER — Encounter: Payer: Self-pay | Admitting: *Deleted

## 2018-07-10 NOTE — Progress Notes (Signed)
Patient seen and assessed by nursing staff.  Agree with documentation and plan.  

## 2018-07-11 ENCOUNTER — Inpatient Hospital Stay (HOSPITAL_COMMUNITY)
Admission: EM | Admit: 2018-07-11 | Discharge: 2018-07-13 | DRG: 831 | Disposition: A | Discharge status: Home / Self Care | Payer: Medicaid Other | Attending: Obstetrics and Gynecology | Admitting: Obstetrics and Gynecology

## 2018-07-11 ENCOUNTER — Encounter (HOSPITAL_COMMUNITY): Payer: Self-pay | Admitting: Emergency Medicine

## 2018-07-11 DIAGNOSIS — O24019 Pre-existing diabetes mellitus, type 1, in pregnancy, unspecified trimester: Secondary | ICD-10-CM

## 2018-07-11 DIAGNOSIS — J4520 Mild intermittent asthma, uncomplicated: Secondary | ICD-10-CM | POA: Diagnosis present

## 2018-07-11 DIAGNOSIS — D563 Thalassemia minor: Secondary | ICD-10-CM | POA: Diagnosis present

## 2018-07-11 DIAGNOSIS — E108 Type 1 diabetes mellitus with unspecified complications: Secondary | ICD-10-CM

## 2018-07-11 DIAGNOSIS — O99513 Diseases of the respiratory system complicating pregnancy, third trimester: Secondary | ICD-10-CM | POA: Diagnosis present

## 2018-07-11 DIAGNOSIS — E1065 Type 1 diabetes mellitus with hyperglycemia: Secondary | ICD-10-CM | POA: Diagnosis present

## 2018-07-11 DIAGNOSIS — E1010 Type 1 diabetes mellitus with ketoacidosis without coma: Secondary | ICD-10-CM | POA: Diagnosis present

## 2018-07-11 DIAGNOSIS — E1110 Type 2 diabetes mellitus with ketoacidosis without coma: Secondary | ICD-10-CM | POA: Diagnosis present

## 2018-07-11 DIAGNOSIS — Z3A31 31 weeks gestation of pregnancy: Secondary | ICD-10-CM

## 2018-07-11 DIAGNOSIS — O24013 Pre-existing diabetes mellitus, type 1, in pregnancy, third trimester: Principal | ICD-10-CM | POA: Diagnosis present

## 2018-07-11 DIAGNOSIS — Z9641 Presence of insulin pump (external) (internal): Secondary | ICD-10-CM | POA: Diagnosis present

## 2018-07-11 DIAGNOSIS — Z794 Long term (current) use of insulin: Secondary | ICD-10-CM

## 2018-07-11 DIAGNOSIS — IMO0002 Reserved for concepts with insufficient information to code with codable children: Secondary | ICD-10-CM | POA: Diagnosis present

## 2018-07-11 LAB — CBC WITH DIFFERENTIAL/PLATELET
Abs Immature Granulocytes: 0.02 10*3/uL (ref 0.00–0.07)
Basophils Absolute: 0 10*3/uL (ref 0.0–0.1)
Basophils Relative: 0 %
Eosinophils Absolute: 0 10*3/uL (ref 0.0–0.5)
Eosinophils Relative: 0 %
HCT: 42.3 % (ref 36.0–46.0)
Hemoglobin: 13.1 g/dL (ref 12.0–15.0)
Immature Granulocytes: 0 %
Lymphocytes Relative: 38 %
Lymphs Abs: 2.1 10*3/uL (ref 0.7–4.0)
MCH: 24.6 pg — ABNORMAL LOW (ref 26.0–34.0)
MCHC: 31 g/dL (ref 30.0–36.0)
MCV: 79.5 fL — AB (ref 80.0–100.0)
Monocytes Absolute: 0.5 10*3/uL (ref 0.1–1.0)
Monocytes Relative: 9 %
Neutro Abs: 2.9 10*3/uL (ref 1.7–7.7)
Neutrophils Relative %: 53 %
PLATELETS: 183 10*3/uL (ref 150–400)
RBC: 5.32 MIL/uL — ABNORMAL HIGH (ref 3.87–5.11)
RDW: 14.3 % (ref 11.5–15.5)
WBC: 5.5 10*3/uL (ref 4.0–10.5)
nRBC: 0 % (ref 0.0–0.2)

## 2018-07-11 LAB — URINALYSIS, ROUTINE W REFLEX MICROSCOPIC
Bilirubin Urine: NEGATIVE
Hgb urine dipstick: NEGATIVE
Ketones, ur: 80 mg/dL — AB
Leukocytes, UA: NEGATIVE
Nitrite: NEGATIVE
PH: 5 (ref 5.0–8.0)
PROTEIN: NEGATIVE mg/dL
Specific Gravity, Urine: 1.033 — ABNORMAL HIGH (ref 1.005–1.030)

## 2018-07-11 LAB — I-STAT VENOUS BLOOD GAS, ED
Acid-base deficit: 9 mmol/L — ABNORMAL HIGH (ref 0.0–2.0)
Bicarbonate: 15.6 mmol/L — ABNORMAL LOW (ref 20.0–28.0)
O2 Saturation: 54 %
TCO2: 16 mmol/L — ABNORMAL LOW (ref 22–32)
pCO2, Ven: 28.9 mmHg — ABNORMAL LOW (ref 44.0–60.0)
pH, Ven: 7.341 (ref 7.250–7.430)
pO2, Ven: 30 mmHg — CL (ref 32.0–45.0)

## 2018-07-11 LAB — CBG MONITORING, ED
Glucose-Capillary: 354 mg/dL — ABNORMAL HIGH (ref 70–99)
Glucose-Capillary: 307 mg/dL — ABNORMAL HIGH (ref 70–99)
Glucose-Capillary: 292 mg/dL — ABNORMAL HIGH (ref 70–99)

## 2018-07-11 LAB — BASIC METABOLIC PANEL
ANION GAP: 12 (ref 5–15)
BUN: 6 mg/dL (ref 6–20)
CO2: 16 mmol/L — ABNORMAL LOW (ref 22–32)
Calcium: 8.9 mg/dL (ref 8.9–10.3)
Chloride: 100 mmol/L (ref 98–111)
Creatinine, Ser: 0.95 mg/dL (ref 0.44–1.00)
GFR calc Af Amer: >60 mL/min (ref >60)
GFR calc non Af Amer: >60 mL/min (ref >60)
GLUCOSE: 335 mg/dL — AB (ref 70–99)
Potassium: 4 mmol/L (ref 3.5–5.1)
Sodium: 128 mmol/L — ABNORMAL LOW (ref 135–145)

## 2018-07-11 MED ORDER — FAMOTIDINE 20 MG PO TABS
10.0000 mg | ORAL_TABLET | Freq: Two times a day (BID) | ORAL | Status: DC | PRN
  Administered 2018-07-11: 10 mg via ORAL
  Filled 2018-07-11: qty 1

## 2018-07-11 MED ORDER — PANTOPRAZOLE SODIUM 40 MG PO TBEC: 40.0000 mg | DELAYED_RELEASE_TABLET | Freq: Every day | ORAL | Status: DC

## 2018-07-11 MED ORDER — POTASSIUM CHLORIDE 10 MEQ/100ML IV SOLN
10.0000 meq | INTRAVENOUS | Status: AC
  Administered 2018-07-11: 10 meq via INTRAVENOUS
  Filled 2018-07-11: qty 100

## 2018-07-11 MED ORDER — DEXTROSE-NACL 5-0.45 % IV SOLN: INTRAVENOUS | Status: DC

## 2018-07-11 MED ORDER — METOCLOPRAMIDE HCL 5 MG/ML IJ SOLN
10.0000 mg | Freq: Once | INTRAMUSCULAR | Status: AC
  Administered 2018-07-11: 10 mg via INTRAVENOUS
  Filled 2018-07-11: qty 2

## 2018-07-11 MED ORDER — PANTOPRAZOLE SODIUM 20 MG PO TBEC: 20.0000 mg | DELAYED_RELEASE_TABLET | Freq: Every day | ORAL | Status: DC

## 2018-07-11 MED ORDER — INSULIN REGULAR(HUMAN) IN NACL 100-0.9 UT/100ML-% IV SOLN
INTRAVENOUS | Status: DC
  Administered 2018-07-11: 2.3 [IU]/h via INTRAVENOUS
  Administered 2018-07-12: 11.3 [IU]/h via INTRAVENOUS
  Filled 2018-07-11 (×2): qty 100

## 2018-07-11 MED ORDER — LACTATED RINGERS IV BOLUS
1000.0000 mL | Freq: Once | INTRAVENOUS | Status: AC
  Administered 2018-07-11: 1000 mL via INTRAVENOUS

## 2018-07-11 MED ORDER — SODIUM CHLORIDE 0.9 % IV BOLUS
1000.0000 mL | Freq: Once | INTRAVENOUS | Status: AC
  Administered 2018-07-11: 1000 mL via INTRAVENOUS

## 2018-07-11 NOTE — ED Notes (Signed)
Reported cbg of 307 to EDP. He agrees with giving the first run of potassium and then starting insulin drip.

## 2018-07-11 NOTE — ED Triage Notes (Signed)
Pt arrives via EMS from home with reports of hyperglycemia and flu-like symptoms, body aches and chills for 2 days. Reports one of her kids had the flu last week. Pt reports she has not been taking her insulin like prescribed and feels nauseous. Pt is [redacted] weeks pregnant

## 2018-07-11 NOTE — ED Notes (Signed)
Unit secretary, Christie [name] called [facility], they were not willing to come at this time. Christie [name], Consulting civil engineerCharge RN notified and asked Christie ContrasDee to call again. They did not come. We got fetal tones and rate of 147.

## 2018-07-11 NOTE — ED Provider Notes (Addendum)
[facility] EMERGENCY DEPARTMENT Provider Note   CSN: [id number] Arrival date & time: 07/11/18  1709     History   Chief Complaint Chief Complaint  Patient presents with  . Hyperglycemia    HPI Christie [name] is a 33 y.o. female.  HPI  33 year old female with a history of type 1 diabetes on an insulin pump presents with hyperglycemia and lightheadedness.  She states that for the last 2 days she is been having significant dizziness with sitting up or standing up.  Today she fell due to the degree of dizziness when standing.  She took off her insulin pump due to swelling in her abdomen about a day or 2 ago.  She is [redacted] weeks pregnant and is G4, P2.  She has not noticed any obvious pregnancy complications such as contractions, vaginal bleeding, leakage of fluid, or trouble noticing baby kicking.  She has had some Deberah Pelton and some on and off pain on her left side.  She is felt hot and cold over the last couple days but has not noticed an actual fever or felt febrile or taken her temperature.  No cough, sore throat or myalgias.  EMS noted flulike symptoms but it seems they are talking about flu illness in the family recently and these hot cold symptoms.  The patient states that she is been having chronic nausea in the pregnancy but has not taken the ondansetron because it makes her constipated.  That nausea and dry heaving is now worse over the last couple days.  She is had on and off headache for about a week that she states is not atypical for her, no current headache.  She also endorses some dyspnea when laying flat over the last week or so.  No chest pain or leg swelling.  Past Medical History:  Diagnosis Date  . Abnormal Pap smear    colpo scheduled  . Alpha thalassemia trait   . Anemia   . Anxiety   . Asthma   . Bipolar 1 disorder (HCC)   . Chlamydia   . Depression   . Diabetes mellitus    type 1 on insulin  . Fx ankle    history of left ankle fx at age  76  . Headache(784.0)   . Hx of migraines   . Pregnancy induced hypertension   . Urinary tract infection     Patient Active Problem List   Diagnosis Date Noted  . DKA (diabetic ketoacidoses) (HCC) 07/11/2018  . 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 Preexisting diabetes complicating pregnancy, antepartum 02/11/2018  . Supervision of high-risk pregnancy 02/11/2018  . History of loop electrical excision procedure (LEEP) 09/23/2017  . Back pain 04/27/2016  . Abnormal Papanicolaou smear of cervix with positive human papilloma virus (HPV) test 09/02/2015  . Adjustment disorder with depressed mood 08/03/2015  . 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 Asthma, mild intermittent 07/06/2013  . Type I diabetes mellitus with complication, uncontrolled (HCC) 06/18/2011  . History of migraine during pregnancy 06/18/2011  . Migraine 06/18/2011    Past Surgical History:  Procedure Laterality Date  . COLPOSCOPY    . 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 WISDOM TOOTH EXTRACTION       OB History    Gravida  4   Para  2   Term  2   Preterm  0   AB  1   Living  2  SAB  0   TAB  1   Ectopic  0   Multiple  0   Live Births  2            Home Medications    Prior to Admission medications   Medication Sig Start Date End Date Taking? Authorizing Provider  acetaminophen (TYLENOL) 500 MG tablet Take 500 mg by mouth every 6 (six) hours as needed.   Yes [provider]  albuterol (PROVENTIL HFA;VENTOLIN HFA) 108 (90 Base) MCG/ACT inhaler Inhale 1-2 puffs into the lungs every 6 (six) hours as needed for wheezing or shortness of breath. 06/14/18  Yes Rolm Bookbinder, CNM  aspirin EC 81 MG tablet Take 1 tablet (81 mg total) by mouth daily. Take after 12 weeks for prevention of preeclampsia later in pregnancy 02/11/18  Yes Anyanwu, Vela Prose A, MD  insulin aspart (NOVOLOG) 100 UNIT/ML injection Inject 25 Units into the skin 3 (three) times daily before meals. Patient  will need an appointment for further refills. 08/17/16  Yes Reva Bores, MD  ondansetron (ZOFRAN) 4 MG tablet Take 1 tablet (4 mg total) by mouth every 8 (eight) hours as needed for nausea or vomiting. 05/27/18  Yes Smith, [place], CNM  Prenatal Vit-Fe Fumarate-FA (PRENATAL MULTIVITAMIN) TABS tablet Take 1 tablet by mouth daily at 12 noon.   Yes [provider]  promethazine (PHENERGAN) 25 MG tablet Take 1 tablet (25 mg total) by mouth every 6 (six) hours as needed for nausea or vomiting. 02/11/18  Yes Anyanwu, Jethro Bastos, MD  ACCU-CHEK FASTCLIX LANCETS MISC 1 Units by Percutaneous route 4 (four) times daily. 06/25/18   Reva Bores, MD  buPROPion (WELLBUTRIN XL) 300 MG 24 hr tablet Take 1 tablet (300 mg total) by mouth daily. Patient not taking: Reported on 07/11/2018 06/25/18   Reva Bores, MD  busPIRone (BUSPAR) 7.5 MG tablet Take 1 tablet (7.5 mg total) by mouth 2 (two) times daily. Patient not taking: Reported on 07/11/2018 06/25/18   Reva Bores, MD  Butalbital-APAP-Caffeine [phone] MG capsule Take 1-2 capsules by mouth every 6 (six) hours as needed for headache. Patient not taking: Reported on 07/11/2018 02/11/18   Anyanwu, Jethro Bastos, MD  glucagon (GLUCAGON EMERGENCY) 1 MG injection Inject 1 mg into the muscle once as needed. Patient not taking: Reported on 07/11/2018 02/04/14   Carlus Pavlov, MD  Insulin Pen Needle 31G X 8 MM MISC As needed for insulin injections 05/17/15   Judeth Horn, NP    Family History Family History  Problem Relation Age of Onset  . Asthma Mother   . Diabetes Mother   . Hypertension Mother   . Asthma Father   . Hypertension Sister   . Diabetes Maternal Grandmother   . Cancer Maternal Grandmother        breast  . Obesity Other   . Sleep apnea Other   . Anesthesia problems Neg Hx     Social History Social History   Tobacco Use  . Smoking status: Never Smoker  . Smokeless tobacco: Never Used  Substance Use Topics  . Alcohol use: Not  Currently    Comment: social drinker  . Drug use: No    Comment: last used 1 years ago     Allergies   Banana; Citrus; and Adhesive [tape]   Review of Systems Review of Systems  Constitutional: Negative for fever.  Respiratory: Positive for shortness of breath (laying flat).   Cardiovascular: Negative for chest pain and leg swelling.  Gastrointestinal: Positive for nausea and vomiting. Negative for abdominal pain and diarrhea.  Genitourinary: Negative for dysuria, hematuria, vaginal bleeding and vaginal discharge.  Neurological: Positive for light-headedness and headaches. Negative for syncope.  All other systems reviewed and are negative.    Physical Exam Updated Vital Signs BP 100/62   Pulse 94   Temp 98.1 F (36.7 C) (Oral)   Resp 18   LMP 12/04/2017 (Exact Date)   SpO2 100%   Physical Exam Vitals signs and nursing note reviewed.  Constitutional:      General: She is not in acute distress.    Appearance: She is well-developed. She is not diaphoretic.     Comments: Significant dizziness when sitting up for back/lung exam  HENT:     Head: Normocephalic and atraumatic.     Right Ear: External ear normal.     Left Ear: External ear normal.     Nose: Nose normal.  Eyes:     General:        Right eye: No discharge.        Left eye: No discharge.  Cardiovascular:     Rate and Rhythm: Regular rhythm. Tachycardia present.     Heart sounds: Normal heart sounds.     Comments: HR low 100s Pulmonary:     Effort: Pulmonary effort is normal.     Breath sounds: Normal breath sounds.  Abdominal:     Palpations: Abdomen is soft.     Tenderness: There is no abdominal tenderness. There is no right CVA tenderness or left CVA tenderness.     Comments: gravid  Musculoskeletal:     Right lower leg: No edema.     Left lower leg: No edema.  Skin:    General: Skin is warm and dry.  Neurological:     Mental Status: She is alert.     Comments: CN 3-12 grossly intact. 5/5  strength in all 4 extremities. Grossly normal sensation. Normal finger to nose.   Psychiatric:        Mood and Affect: Mood is not anxious.      ED Treatments / Results  Labs (all labs ordered are listed, but only abnormal results are displayed) Labs Reviewed  BASIC METABOLIC PANEL - Abnormal; Notable for the following components:      Result Value   Sodium 128 (*)    CO2 16 (*)    Glucose, Bld 335 (*)    All other components within normal limits  CBC WITH DIFFERENTIAL/PLATELET - Abnormal; Notable for the following components:   RBC 5.32 (*)    MCV 79.5 (*)    MCH 24.6 (*)    All other components within normal limits  URINALYSIS, ROUTINE W REFLEX MICROSCOPIC - Abnormal; Notable for the following components:   Color, Urine STRAW (*)    Specific Gravity, Urine 1.033 (*)    Glucose, UA >=500 (*)    Ketones, ur 80 (*)    Bacteria, UA RARE (*)    All other components within normal limits  CBG MONITORING, ED - Abnormal; Notable for the following components:   Glucose-Capillary 354 (*)    All other components within normal limits  I-STAT VENOUS BLOOD GAS, ED - Abnormal; Notable for the following components:   pCO2, Ven 28.9 (*)    pO2, Ven 30.0 (*)    Bicarbonate 15.6 (*)    TCO2 16 (*)    Acid-base deficit 9.0 (*)    All other components within normal limits  CBG  MONITORING, ED - Abnormal; Notable for the following components:   Glucose-Capillary 307 (*)    All other components within normal limits  CBG MONITORING, ED - Abnormal; Notable for the following components:   Glucose-Capillary 292 (*)    All other components within normal limits  URINE CULTURE    EKG EKG Interpretation  Date/Time:  Friday July 11 2018 17:46:34 EST Ventricular Rate:  80 PR Interval:    QRS Duration: 85 QT Interval:  382 QTC Calculation: 441 R Axis:   71 Text Interpretation:  Sinus rhythm ST elev, probable normal early repol pattern T waves peaked compared to 2014 Confirmed by Pricilla LovelessGoldston,  Yotam Rhine [id number]) on 07/11/2018 6:23:01 PM   Radiology No results found.  Procedures Procedures (including critical care time)  Medications Ordered in ED Medications  insulin regular, human (MYXREDLIN) 100 units/ 100 mL infusion (has no administration in time range)  potassium chloride 10 mEq in 100 mL IVPB (0 mEq Intravenous Stopped 07/11/18 2245)  dextrose 5 %-0.45 % sodium chloride infusion (has no administration in time range)  pantoprazole (PROTONIX) EC tablet 40 mg (has no administration in time range)  famotidine (PEPCID) tablet 10 mg (has no administration in time range)  sodium chloride 0.9 % bolus 1,000 mL (0 mLs Intravenous Stopped 07/11/18 1915)  metoCLOPramide (REGLAN) injection 10 mg (10 mg Intravenous Given 07/11/18 1823)  lactated ringers bolus 1,000 mL (0 mLs Intravenous Stopped 07/11/18 1931)     Initial Impression / Assessment and Plan / ED Course  I have reviewed the triage vital signs and the nursing notes.  Pertinent labs & imaging results that were available during my care of the patient were reviewed by me and considered in my medical decision making (see chart for details).     Labs appear to show compensated DKA.  While her pH is okay, she has a bicarb of 16 and decreased CO2 to compensate.  80 ketones in her urine.  I think it would be beneficial for her to stay in the hospital and get this treated with insulin drip and fluids.  Potassium is okay, though expected to go down with the insulin.  She will be admitted to the hospitalist service.  No obvious infection to cause the symptoms. While she's pregnant, doesn't seem to have any acute complications of pregnancy at this time.  Dr. Loney Lohathore notes [facility] given how far along in her pregnancy she is. I discussed with Dr. Emelda FearFerguson of OB, will accept in transfer[facility].   Final Clinical Impressions(s) / ED Diagnoses   Final diagnoses:  Diabetic ketoacidosis without coma  associated with type 1 diabetes mellitus [facility](HCC)    ED Discharge Orders    None       Pricilla LovelessGoldston, Tanyah Debruyne, MD 07/11/18 [id number]    Pricilla LovelessGoldston, Ciela Mahajan, MD 07/11/18 [id number]    Pricilla LovelessGoldston, Elfie Costanza, MD 07/12/18 [phone]

## 2018-07-12 ENCOUNTER — Other Ambulatory Visit: Payer: Self-pay

## 2018-07-12 DIAGNOSIS — O24013 Pre-existing diabetes mellitus, type 1, in pregnancy, third trimester: Secondary | ICD-10-CM | POA: Diagnosis present

## 2018-07-12 DIAGNOSIS — O24019 Pre-existing diabetes mellitus, type 1, in pregnancy, unspecified trimester: Secondary | ICD-10-CM | POA: Diagnosis present

## 2018-07-12 DIAGNOSIS — E0810 Diabetes mellitus due to underlying condition with ketoacidosis without coma: Secondary | ICD-10-CM

## 2018-07-12 DIAGNOSIS — D563 Thalassemia minor: Secondary | ICD-10-CM | POA: Diagnosis present

## 2018-07-12 DIAGNOSIS — Z794 Long term (current) use of insulin: Secondary | ICD-10-CM | POA: Diagnosis not present

## 2018-07-12 DIAGNOSIS — J4520 Mild intermittent asthma, uncomplicated: Secondary | ICD-10-CM | POA: Diagnosis present

## 2018-07-12 DIAGNOSIS — Z3A31 31 weeks gestation of pregnancy: Secondary | ICD-10-CM | POA: Diagnosis not present

## 2018-07-12 DIAGNOSIS — Z9641 Presence of insulin pump (external) (internal): Secondary | ICD-10-CM | POA: Diagnosis present

## 2018-07-12 DIAGNOSIS — O99513 Diseases of the respiratory system complicating pregnancy, third trimester: Secondary | ICD-10-CM | POA: Diagnosis present

## 2018-07-12 DIAGNOSIS — E1010 Type 1 diabetes mellitus with ketoacidosis without coma: Secondary | ICD-10-CM | POA: Diagnosis present

## 2018-07-12 LAB — URINE CULTURE: Culture: NO GROWTH

## 2018-07-12 LAB — CBC
HCT: 37.8 % (ref 36.0–46.0)
Hemoglobin: 12.3 g/dL (ref 12.0–15.0)
MCH: 25.6 pg — ABNORMAL LOW (ref 26.0–34.0)
MCHC: 32.5 g/dL (ref 30.0–36.0)
MCV: 78.8 fL — ABNORMAL LOW (ref 80.0–100.0)
NRBC: 0 % (ref 0.0–0.2)
Platelets: 172 10*3/uL (ref 150–400)
RBC: 4.8 MIL/uL (ref 3.87–5.11)
RDW: 14.4 % (ref 11.5–15.5)
WBC: 5.4 10*3/uL (ref 4.0–10.5)

## 2018-07-12 LAB — COMPREHENSIVE METABOLIC PANEL
ALT: 11 U/L (ref 0–44)
AST: 14 U/L — ABNORMAL LOW (ref 15–41)
Albumin: 2.3 g/dL — ABNORMAL LOW (ref 3.5–5.0)
Alkaline Phosphatase: 47 U/L (ref 38–126)
Anion gap: 13 (ref 5–15)
BUN: 9 mg/dL (ref 6–20)
CALCIUM: 8 mg/dL — AB (ref 8.9–10.3)
CO2: 11 mmol/L — ABNORMAL LOW (ref 22–32)
Chloride: 104 mmol/L (ref 98–111)
Creatinine, Ser: 0.7 mg/dL (ref 0.44–1.00)
GFR calc Af Amer: >60 mL/min (ref >60)
GFR calc non Af Amer: >60 mL/min (ref >60)
Glucose, Bld: 298 mg/dL — ABNORMAL HIGH (ref 70–99)
Potassium: 4 mmol/L (ref 3.5–5.1)
SODIUM: 128 mmol/L — AB (ref 135–145)
TOTAL PROTEIN: 6.1 g/dL — AB (ref 6.5–8.1)
Total Bilirubin: 1.2 mg/dL (ref 0.3–1.2)

## 2018-07-12 LAB — RAPID URINE DRUG SCREEN, HOSP PERFORMED
Amphetamines: NOT DETECTED
BARBITURATES: NOT DETECTED
Benzodiazepines: NOT DETECTED
Cocaine: NOT DETECTED
Opiates: NOT DETECTED
Tetrahydrocannabinol: NOT DETECTED

## 2018-07-12 LAB — TYPE AND SCREEN
ABO/RH(D): A POS
Antibody Screen: NEGATIVE

## 2018-07-12 LAB — CBG MONITORING, ED
Glucose-Capillary: 302 mg/dL — ABNORMAL HIGH (ref 70–99)
Glucose-Capillary: 320 mg/dL — ABNORMAL HIGH (ref 70–99)

## 2018-07-12 LAB — GLUCOSE, CAPILLARY
GLUCOSE-CAPILLARY: 244 mg/dL — AB (ref 70–99)
GLUCOSE-CAPILLARY: 266 mg/dL — AB (ref 70–99)
GLUCOSE-CAPILLARY: 300 mg/dL — AB (ref 70–99)
Glucose-Capillary: 257 mg/dL — ABNORMAL HIGH (ref 70–99)
Glucose-Capillary: 337 mg/dL — ABNORMAL HIGH (ref 70–99)
Glucose-Capillary: 241 mg/dL — ABNORMAL HIGH (ref 70–99)
Glucose-Capillary: 232 mg/dL — ABNORMAL HIGH (ref 70–99)
Glucose-Capillary: 222 mg/dL — ABNORMAL HIGH (ref 70–99)
Glucose-Capillary: 186 mg/dL — ABNORMAL HIGH (ref 70–99)
Glucose-Capillary: 199 mg/dL — ABNORMAL HIGH (ref 70–99)
Glucose-Capillary: 248 mg/dL — ABNORMAL HIGH (ref 70–99)
Glucose-Capillary: 210 mg/dL — ABNORMAL HIGH (ref 70–99)

## 2018-07-12 LAB — HEMOGLOBIN A1C
Hgb A1c MFr Bld: 12 % — ABNORMAL HIGH (ref 4.8–5.6)
Mean Plasma Glucose: 297.7 mg/dL

## 2018-07-12 MED ORDER — CALCIUM CARBONATE ANTACID 500 MG PO CHEW: 2.0000 | CHEWABLE_TABLET | ORAL | Status: DC | PRN

## 2018-07-12 MED ORDER — ACETAMINOPHEN 325 MG PO TABS: 650.0000 mg | ORAL_TABLET | ORAL | Status: DC | PRN

## 2018-07-12 MED ORDER — KCL IN DEXTROSE-NACL 20-5-0.45 MEQ/L-%-% IV SOLN
INTRAVENOUS | Status: DC
  Administered 2018-07-12: 03:00:00 via INTRAVENOUS
  Filled 2018-07-12: qty 1000

## 2018-07-12 MED ORDER — ONDANSETRON HCL 4 MG/2ML IJ SOLN
4.0000 mg | Freq: Four times a day (QID) | INTRAMUSCULAR | Status: DC | PRN
  Administered 2018-07-12 – 2018-07-13 (×2): 4 mg via INTRAVENOUS
  Filled 2018-07-12 (×2): qty 2

## 2018-07-12 MED ORDER — SODIUM CHLORIDE 0.9 % IV BOLUS
1000.0000 mL | Freq: Once | INTRAVENOUS | Status: AC
  Administered 2018-07-12: 1000 mL via INTRAVENOUS

## 2018-07-12 MED ORDER — DOCUSATE SODIUM 100 MG PO CAPS
100.0000 mg | ORAL_CAPSULE | Freq: Every day | ORAL | Status: DC
  Administered 2018-07-12 – 2018-07-13 (×2): 100 mg via ORAL
  Filled 2018-07-12 (×2): qty 1

## 2018-07-12 MED ORDER — PRENATAL MULTIVITAMIN CH
1.0000 | ORAL_TABLET | Freq: Every day | ORAL | Status: DC
  Administered 2018-07-12 – 2018-07-13 (×2): 1 via ORAL
  Filled 2018-07-12 (×2): qty 1

## 2018-07-12 MED ORDER — INSULIN ASPART 100 UNIT/ML ~~LOC~~ SOLN
0.0000 [IU] | SUBCUTANEOUS | Status: DC
  Administered 2018-07-12: 8 [IU] via SUBCUTANEOUS
  Administered 2018-07-12: 16 [IU] via SUBCUTANEOUS
  Administered 2018-07-12: 12 [IU] via SUBCUTANEOUS

## 2018-07-12 MED ORDER — ALUM & MAG HYDROXIDE-SIMETH 200-200-20 MG/5ML PO SUSP
30.0000 mL | Freq: Once | ORAL | Status: AC
  Administered 2018-07-12: 30 mL via ORAL
  Filled 2018-07-12: qty 30

## 2018-07-12 MED ORDER — ZOLPIDEM TARTRATE 5 MG PO TABS: 5.0000 mg | ORAL_TABLET | Freq: Every evening | ORAL | Status: DC | PRN

## 2018-07-12 NOTE — H&P (Signed)
FACULTY PRACTICE ANTEPARTUM ADMISSION HISTORY AND PHYSICAL NOTE   History of Present Illness: Christie [name] is a 33 y.o. [id number] at [redacted]w[redacted]d admitted for hyperglycemia, and generalized malaise..  She is a A5W0981 at [redacted]w[redacted]d By best criteria. Prenatal course has been notable for poor diabetic control, and poor patient commitment to glucose control. She began to feel malaise on 07/09/2018, and removed her insulin infusion site, and has been lying around since then. Her last meal was a hot dog early on 07/10/2018, She was seen at [facility] ED , with CBG of 307 on admit, and labs included ABG arterial blood gas, 7.341, acid/base deficit 9( nl 0-2),   Patient reports the fetal movement as active. Patient reports uterine contraction  activity as none. Patient reports  vaginal bleeding as none. Patient describes fluid per vagina as None. Fetal presentation is unsure.  Patient Active Problem List   Diagnosis Date Noted  . Type 1 diabetes mellitus in pregnancy 07/12/2018  . DKA (diabetic ketoacidoses) (HCC) 07/11/2018  . 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 Preexisting diabetes complicating pregnancy, antepartum 02/11/2018  . Supervision of high-risk pregnancy 02/11/2018  . History of loop electrical excision procedure (LEEP) 09/23/2017  . Back pain 04/27/2016  . Abnormal Papanicolaou smear of cervix with positive human papilloma virus (HPV) test 09/02/2015  . Adjustment disorder with depressed mood 08/03/2015  . 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 Asthma, mild intermittent 07/06/2013  . Type I diabetes mellitus with complication, uncontrolled (HCC) 06/18/2011  . History of migraine during pregnancy 06/18/2011  . Migraine 06/18/2011    Past Medical History:  Diagnosis Date  . Abnormal Pap smear    colpo scheduled  . Alpha thalassemia trait   . Anemia   . Anxiety   . Asthma   . Bipolar 1 disorder (HCC)   . Chlamydia   . Depression   . Diabetes mellitus    type 1 on  insulin  . Fx ankle    history of left ankle fx at age 38  . Headache(784.0)   . Hx of migraines   . Pregnancy induced hypertension   . Urinary tract infection     Past Surgical History:  Procedure Laterality Date  . COLPOSCOPY    . 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 WISDOM TOOTH EXTRACTION      OB History  Gravida Para Term Preterm AB Living  4 2 2  0 1 2  SAB TAB Ectopic Multiple Live Births  0 1 0 0 2    # Outcome Date GA Lbr Len/2nd Weight Sex Delivery Anes PTL Lv  4 Current           3 Term 11/04/12    M Vag-Spont   LIV  2 TAB 12/27/11 [redacted]w[redacted]d            Birth Comments: no complications  1 Term 09/12/07   4252 g M Vag-Spont EPI N LIV     Birth Comments: pre eclampsia    Social History   Socioeconomic History  . Marital status: Single    Spouse name: Not on file  . Number of children: Not on file  . Years of education: Not on file  . Highest education level: Not on file  Occupational History  . Not on file  Social Needs  . Financial resource strain: Not on file  . Food insecurity:    Worry: Not on file    Inability: Not on file  .  Transportation needs:    Medical: Not on file    Non-medical: Not on file  Tobacco Use  . Smoking status: Never Smoker  . Smokeless tobacco: Never Used  Substance and Sexual Activity  . Alcohol use: Not Currently    Comment: social drinker  . Drug use: No    Comment: last used 1 years ago  . Sexual activity: Not Currently    Birth control/protection: None  Lifestyle  . Physical activity:    Days per week: Not on file    Minutes per session: Not on file  . Stress: Not on file  Relationships  . Social connections:    Talks on phone: Not on file    Gets together: Not on file    Attends religious service: Not on file    Active member of club or organization: Not on file    Attends meetings of clubs or organizations: Not on file    Relationship status: Not on file  Other Topics Concern  . Not on file  Social History  Narrative  . Not on file    Family History  Problem Relation Age of Onset  . Asthma Mother   . Diabetes Mother   . Hypertension Mother   . Asthma Father   . Hypertension Sister   . Diabetes Maternal Grandmother   . Cancer Maternal Grandmother        breast  . Obesity Other   . Sleep apnea Other   . Anesthesia problems Neg Hx     Allergies  Allergen Reactions  . Banana Anaphylaxis  . Citrus Other (See Comments)    Gums bleed  . Adhesive [Tape] Rash    Medications Prior to Admission  Medication Sig Dispense Refill Last Dose  . acetaminophen (TYLENOL) 500 MG tablet Take 500 mg by mouth every 6 (six) hours as needed.   unk  . albuterol (PROVENTIL HFA;VENTOLIN HFA) 108 (90 Base) MCG/ACT inhaler Inhale 1-2 puffs into the lungs every 6 (six) hours as needed for wheezing or shortness of breath. 1 Inhaler 0 Past Week at Unknown time  . aspirin EC 81 MG tablet Take 1 tablet (81 mg total) by mouth daily. Take after 12 weeks for prevention of preeclampsia later in pregnancy 300 tablet 2 Past Week at Unknown time  . insulin aspart (NOVOLOG) 100 UNIT/ML injection Inject 25 Units into the skin 3 (three) times daily before meals. Patient will need an appointment for further refills. 10 mL 1 07/09/2018  . ondansetron (ZOFRAN) 4 MG tablet Take 1 tablet (4 mg total) by mouth every 8 (eight) hours as needed for nausea or vomiting. 30 tablet 3 07/10/2018 at Unknown time  . Prenatal Vit-Fe Fumarate-FA (PRENATAL MULTIVITAMIN) TABS tablet Take 1 tablet by mouth daily at 12 noon.   07/11/2018 at Unknown time  . promethazine (PHENERGAN) 25 MG tablet Take 1 tablet (25 mg total) by mouth every 6 (six) hours as needed for nausea or vomiting. 30 tablet 2 Past Week at Unknown time  . ACCU-CHEK FASTCLIX LANCETS MISC 1 Units by Percutaneous route 4 (four) times daily. 100 each 12 Taking  . buPROPion (WELLBUTRIN XL) 300 MG 24 hr tablet Take 1 tablet (300 mg total) by mouth daily. (Patient not taking: Reported on  07/11/2018) 30 tablet 2 Not Taking at Unknown time  . busPIRone (BUSPAR) 7.5 MG tablet Take 1 tablet (7.5 mg total) by mouth 2 (two) times daily. (Patient not taking: Reported on 07/11/2018) 60 tablet 2 Not Taking at Unknown  time  . Butalbital-APAP-Caffeine 50-325-40 MG capsule Take 1-2 capsules by mouth every 6 (six) hours as needed for headache. (Patient not taking: Reported on 07/11/2018) 30 capsule 3 Not Taking at Unknown time  . glucagon (GLUCAGON EMERGENCY) 1 MG injection Inject 1 mg into the muscle once as needed. (Patient not taking: Reported on 07/11/2018) 1 each 12 Not Taking at Unknown time  . Insulin Pen Needle 31G X 8 MM MISC As needed for insulin injections 100 each 1 Taking    Review of Systems - denies fever, chills , myalgias.  Describes self as sometimes tiring of managing her diabetes, and she shuts down.  Social Hx : FOB describes himself as her fiancee, 1 yr relationship, FOB's mother died of diabetic complications.  Vitals:  BP 117/81 (BP Location: Left Arm)   Pulse (!) 107   Temp 98.6 F (37 C) (Oral)   Resp (!) 22   Ht 5\' 9"  (1.753 m)   Wt 104.8 kg   LMP 12/04/2017 (Exact Date)   SpO2 99%   BMI 34.12 kg/m  Physical Examination: CONSTITUTIONAL: Well-developed, well-nourished female in no acute distress.   HENT:  Normocephalic, atraumatic, External right and left ear normal. Oropharynx is clear and moist EYES: Conjunctivae and EOM are normal. Pupils are equal, round, and reactive to light. No scleral icterus.  NECK: Normal range of motion, supple, no masses SKIN: Skin is warm and dry. No rash noted. Not diaphoretic. No erythema. No pallor. NEUROLGIC: Alert and oriented to person, place, and time. Normal reflexes, muscle tone coordination. No cranial nerve deficit noted. PSYCHIATRIC: Normal mood and affect. Normal behavior. Normal judgment and thought content. CARDIOVASCULAR: Normal heart rate noted, regular rhythm RESPIRATORY: Effort and breath sounds normal, no problems  with respiration noted ABDOMEN: Soft, nontender, nondistended, gravid. MUSCULOSKELETAL: Normal range of motion. No edema and no tenderness. 2+ distal pulses.  Cervix: Not evaluated. A Membranes:intact Fetal Monitoring:Baseline: 140 bpm, Variability: Fair (1-6 bpm), Accelerations: Non-reactive but appropriate for gestational age and Decelerations: Absent Tocometer: Flat  Labs:  Results for orders placed or performed during the hospital encounter of 07/11/18 (from the past 24 hour(s))  CBG monitoring, ED   Collection Time: 07/11/18  5:13 PM  Result Value Ref Range   Glucose-Capillary 354 (H) 70 - 99 mg/dL  Basic metabolic panel   Collection Time: 07/11/18  6:00 PM  Result Value Ref Range   Sodium 128 (L) 135 - 145 mmol/L   Potassium 4.0 3.5 - 5.1 mmol/L   Chloride 100 98 - 111 mmol/L   CO2 16 (L) 22 - 32 mmol/L   Glucose, Bld 335 (H) 70 - 99 mg/dL   BUN 6 6 - 20 mg/dL   Creatinine, Ser 1.61 0.44 - 1.00 mg/dL   Calcium 8.9 8.9 - 09.6 mg/dL   GFR calc non Af Amer >60 >60 mL/min   GFR calc Af Amer >60 >60 mL/min   Anion gap 12 5 - 15  CBC with Differential   Collection Time: 07/11/18  6:00 PM  Result Value Ref Range   WBC 5.5 4.0 - 10.5 K/uL   RBC 5.32 (H) 3.87 - 5.11 MIL/uL   Hemoglobin 13.1 12.0 - 15.0 g/dL   HCT 04.5 40.9 - 81.1 %   MCV 79.5 (L) 80.0 - 100.0 fL   MCH 24.6 (L) 26.0 - 34.0 pg   MCHC 31.0 30.0 - 36.0 g/dL   RDW 91.4 78.2 - 95.6 %   Platelets 183 150 - 400 K/uL   nRBC  0.0 0.0 - 0.2 %   Neutrophils Relative % 53 %   Neutro Abs 2.9 1.7 - 7.7 K/uL   Lymphocytes Relative 38 %   Lymphs Abs 2.1 0.7 - 4.0 K/uL   Monocytes Relative 9 %   Monocytes Absolute 0.5 0.1 - 1.0 K/uL   Eosinophils Relative 0 %   Eosinophils Absolute 0.0 0.0 - 0.5 K/uL   Basophils Relative 0 %   Basophils Absolute 0.0 0.0 - 0.1 K/uL   Immature Granulocytes 0 %   Abs Immature Granulocytes 0.02 0.00 - 0.07 K/uL  I-Stat venous blood gas, ED   Collection Time: 07/11/18  6:13 PM  Result  Value Ref Range   pH, Ven 7.341 7.250 - 7.430   pCO2, Ven 28.9 (L) 44.0 - 60.0 mmHg   pO2, Ven 30.0 (LL) 32.0 - 45.0 mmHg   Bicarbonate 15.6 (L) 20.0 - 28.0 mmol/L   TCO2 16 (L) 22 - 32 mmol/L   O2 Saturation 54.0 %   Acid-base deficit 9.0 (H) 0.0 - 2.0 mmol/L   Patient temperature HIDE    Sample type VENOUS    Comment NOTIFIED PHYSICIAN   Urinalysis, Routine w reflex microscopic   Collection Time: 07/11/18  7:05 PM  Result Value Ref Range   Color, Urine STRAW (A) YELLOW   APPearance CLEAR CLEAR   Specific Gravity, Urine 1.033 (H) 1.005 - 1.030   pH 5.0 5.0 - 8.0   Glucose, UA >=500 (A) NEGATIVE mg/dL   Hgb urine dipstick NEGATIVE NEGATIVE   Bilirubin Urine NEGATIVE NEGATIVE   Ketones, ur 80 (A) NEGATIVE mg/dL   Protein, ur NEGATIVE NEGATIVE mg/dL   Nitrite NEGATIVE NEGATIVE   Leukocytes, UA NEGATIVE NEGATIVE   RBC / HPF 0-5 0 - 5 RBC/hpf   WBC, UA 0-5 0 - 5 WBC/hpf   Bacteria, UA RARE (A) NONE SEEN   Squamous Epithelial / LPF 0-5 0 - 5   Mucus PRESENT   CBG monitoring, ED   Collection Time: 07/11/18  9:42 PM  Result Value Ref Range   Glucose-Capillary 307 (H) 70 - 99 mg/dL  CBG monitoring, ED   Collection Time: 07/11/18 10:42 PM  Result Value Ref Range   Glucose-Capillary 292 (H) 70 - 99 mg/dL  CBG monitoring, ED   Collection Time: 07/12/18 12:05 AM  Result Value Ref Range   Glucose-Capillary 320 (H) 70 - 99 mg/dL   Comment 1 Notify RN    Comment 2 Document in Chart   CBG monitoring, ED   Collection Time: 07/12/18 12:27 AM  Result Value Ref Range   Glucose-Capillary 302 (H) 70 - 99 mg/dL   Comment 1 Notify RN    Comment 2 Document in Chart   Glucose, capillary   Collection Time: 07/12/18  1:32 AM  Result Value Ref Range   Glucose-Capillary 257 (H) 70 - 99 mg/dL  Glucose, capillary   Collection Time: 07/12/18  2:39 AM  Result Value Ref Range   Glucose-Capillary 244 (H) 70 - 99 mg/dL    Imaging Studies: Ct Angio Chest Pe W And/or Wo Contrast  Result Date:  06/13/2018 CLINICAL DATA:  33 year old female with shortness of breath. History of asthma. EXAM: CT ANGIOGRAPHY CHEST WITH CONTRAST TECHNIQUE: Multidetector CT imaging of the chest was performed using the standard protocol during bolus administration of intravenous contrast. Multiplanar CT image reconstructions and MIPs were obtained to evaluate the vascular anatomy. CONTRAST:  ISOVUE-370 IOPAMIDOL (ISOVUE-370) INJECTION 76% COMPARISON:  Chest radiograph dated 07/08/2008 FINDINGS: Cardiovascular: There  is no cardiomegaly or pericardial effusion. The thoracic aorta is unremarkable. Mildly dilated main pulmonary trunk may represent a degree of pulmonary hypertension. Evaluation of the pulmonary arteries is somewhat limited due to suboptimal opacification and timing of the contrast. No central pulmonary artery embolus identified. Mediastinum/Nodes: There is no hilar or mediastinal adenopathy. Esophagus is grossly unremarkable. No mediastinal fluid collection. Lungs/Pleura: The lungs are clear. There is no pleural effusion or pneumothorax. The central airways are patent. Upper Abdomen: No acute abnormality. Musculoskeletal: No chest wall abnormality. No acute or significant osseous findings. Review of the MIP images confirms the above findings. IMPRESSION: No acute intrathoracic pathology. No CT evidence of central pulmonary artery embolus. Electronically Signed   By: Elgie Collard M.D.   On: 06/13/2018 23:54   [place] Mfm Fetal Bpp Wo Non Stress  Result Date: 07/08/2018 ----------------------------------------------------------------------  OBSTETRICS REPORT                        (Signed Final 07/08/2018 05:22 am) ---------------------------------------------------------------------- Patient Info  ID #:       [id number]                          D.O.B.:  06/02/1986 (32 yrs)  Name:       [name]             Visit Date: 07/07/2018 10:51 am  ---------------------------------------------------------------------- Performed By  Performed By:     Lenise Arena        Ref. Address:      [address]                                                              [place], [place]                                                              [address]  Attending:        Lin Landsman      Location:          [facility]                    MD  Referred By:      Tereso Newcomer MD ---------------------------------------------------------------------- Orders   #  Description                          Code  Ordered By   1  [place] MFM FETAL BPP WO NON              76819.01     Adele Dan      STRESS  ----------------------------------------------------------------------   #  Order #                    Accession #                 Episode #   1  [id number]                  [id number]                  [id number]  ---------------------------------------------------------------------- Indications   Pre-existing diabetes, type 1, in pregnancy,   O24.013   third trimester (Novolog)   Poor obstetric history: Previous               O09.299   preeclampsia / eclampsia/gestational HTN   Asthma (Mild, Intermittent)                    O99.89 j45.909   [redacted] weeks gestation of pregnancy                Z3A.30  ---------------------------------------------------------------------- Vital Signs  Weight (lb): 231                               Height:        5'9"  BMI:         34.11 ---------------------------------------------------------------------- Fetal Evaluation  Num Of Fetuses:          1  Fetal Heart Rate(bpm):   144  Cardiac Activity:        Observed  Presentation:            Cephalic  Placenta:                Posterior  Amniotic Fluid  AFI FV:      Within normal limits  AFI Sum(cm)     %Tile       Largest Pocket(cm)  18.75           71          9.22  RUQ(cm)        RLQ(cm)       LUQ(cm)        LLQ(cm)  9.22          2.87          2.95           3.71 ---------------------------------------------------------------------- Biophysical Evaluation  Amniotic F.V:   Within normal limits       F. Tone:         Observed  F. Movement:    Observed                   Score:           8/8  F. Breathing:   Observed ---------------------------------------------------------------------- OB History  Gravidity:    4         Term:   2  TOP:          1        Living:  2 ---------------------------------------------------------------------- Gestational Age  LMP:           30w 5d        Date:  12/04/17  EDD:   09/10/18  Best:          30w 5d     Det. By:  LMP  (12/04/17)          EDD:   09/10/18 ---------------------------------------------------------------------- Anatomy  Thoracic:              Appears normal         Abdomen:                Appears normal  Diaphragm:             Appears normal         Kidneys:                Appear normal  Stomach:               Appears normal, left   Bladder:                Appears normal                         sided ---------------------------------------------------------------------- Cervix Uterus Adnexa  Cervix  Not visualized (advanced GA >24wks) ---------------------------------------------------------------------- Impression  Normall Biophysical profile ---------------------------------------------------------------------- Recommendations  Follow up BP scheduled on 1/6 ----------------------------------------------------------------------               Lin Landsman, MD Electronically Signed Final Report   07/08/2018 05:22 am ----------------------------------------------------------------------  [place] Mfm Fetal Bpp Wo Non Stress  Result Date: 07/01/2018 ----------------------------------------------------------------------  OBSTETRICS REPORT                       (Signed Final 07/01/2018 12:42 pm)  ---------------------------------------------------------------------- Patient Info  ID #:       [id number]                          D.O.B.:  [birth year] (32 yrs)  Name:       [name]             Visit Date: 07/01/2018 11:06 am ---------------------------------------------------------------------- Performed By  Performed By:     Marcellina Millin          Ref. Address:      [address]                                                              [place], [place]                                                              [address]  Attending:        Lin Landsman      Location:          [facility]                    MD  Referred By:      Tereso Newcomer MD ---------------------------------------------------------------------- Orders   #  Description                          Code         Ordered By   1  [place] MFM FETAL BPP WO NON              76819.01     ROBERT JACOBSON      STRESS  ----------------------------------------------------------------------   #  Order #                    Accession #                 Episode #   1  [id number]                  [id number]                  [id number]  ---------------------------------------------------------------------- Indications   Pre-existing diabetes, type 1, in pregnancy,   O24.013   third trimester (Novolog)   [redacted] weeks gestation of pregnancy                Z3A.29   Poor obstetric history: Previous               O09.299   preeclampsia / eclampsia/gestational HTN   Asthma (Mild, Intermittent)                    O99.89 j45.909  ---------------------------------------------------------------------- Vital Signs                                                 Height:        5'9" ---------------------------------------------------------------------- Fetal Evaluation  Num Of Fetuses:          1  Fetal Heart Rate(bpm):   140  Cardiac Activity:        Observed   Presentation:            Cephalic  Placenta:                Posterior  P. Cord Insertion:       Previously Visualized  Amniotic Fluid  AFI FV:      Within normal limits  AFI Sum(cm)     %Tile       Largest Pocket(cm)  19.36           75          8.03  RUQ(cm)       RLQ(cm)       LUQ(cm)        LLQ(cm)  8.03          2.77          3.08           5.48 ---------------------------------------------------------------------- Biophysical Evaluation  Amniotic F.V:   Within  normal limits       F. Tone:         Observed  F. Movement:    Observed                   Score:           8/8  F. Breathing:   Observed ---------------------------------------------------------------------- OB History  Gravidity:    4         Term:   2  TOP:          1        Living:  2 ---------------------------------------------------------------------- Gestational Age  LMP:           29w 6d        Date:  12/04/17                 EDD:   09/10/18  Best:          29w 6d     Det. By:  LMP  (12/04/17)          EDD:   09/10/18 ---------------------------------------------------------------------- Anatomy  Stomach:               Appears normal, left   Bladder:                Appears normal                         sided ---------------------------------------------------------------------- Impression  Biophysical profile 8/8 ---------------------------------------------------------------------- Recommendations  Follow up scheduled in 1 weeks ----------------------------------------------------------------------               Lin Landsmanorenthian Booker, MD Electronically Signed Final Report   07/01/2018 12:42 pm ----------------------------------------------------------------------  [place] Mfm Fetal Bpp Wo Non Stress  Result Date: 06/24/2018 ----------------------------------------------------------------------  OBSTETRICS REPORT                       (Signed Final 06/24/2018 10:01 am) ---------------------------------------------------------------------- Patient  Info  ID #:       [id number]                          D.O.B.:  March 13, 1986 (32 yrs)  Name:       Octavio GravesKINYETTA D [name]             Visit Date: 06/24/2018 09:25 am ---------------------------------------------------------------------- Performed By  Performed By:     Emeline DarlingKasie E Kiser BS,      Ref. Address:     [address]                    [address]                                                             [address]                                                             [place], [place]  [id number]  Attending:        Patsi Searsobert L Jacobson      Location:         [facility]                    MD  Referred By:      Tereso NewcomerUGONNA A                    ANYANWU MD ---------------------------------------------------------------------- Orders   #  Description                          Code         Ordered By   1  US MFM OB FOLLOW UP                  60454.0976816.01     Adele DanOBERT JACOBSON   2  US MFM FETAL BPP WO NON              76819.01     Adele DanROBERT JACOBSON      STRESS  ----------------------------------------------------------------------   #  Order #                    Accession #                 Episode #   1  [id number]                  [id number]                  [id number]   2  [id number]                  [id number]                  [id number]  ---------------------------------------------------------------------- Indications   Poor obstetric history: Previous               O09.299   preeclampsia / eclampsia/gestational HTN   Asthma (Mild, Intermittent)                    O99.89 j45.909   Pre-existing diabetes, type 1, in pregnancy,   O24.012   second trimester (Novolog)   Antenatal follow-up for nonvisualized fetal    Z36.2   anatomy   [redacted] weeks gestation of pregnancy                Z3A.28   Obesity complicating pregnancy, second         O99.212   trimester (pre-pregnancy BMI 35)  ---------------------------------------------------------------------- Vital Signs  Weight (lb): 234                                Height:        5'9"  BMI:         34.55 ---------------------------------------------------------------------- Fetal Evaluation  Num Of Fetuses:         1  Fetal Heart Rate(bpm):  148  Cardiac Activity:       Observed  Presentation:           Cephalic  Placenta:               Posterior  P. Cord Insertion:      Previously Visualized  Amniotic Fluid  AFI FV:      Within normal limits  AFI Sum(cm)     %  Tile       Largest Pocket(cm)  22.13           90          7.66  RUQ(cm)       RLQ(cm)       LUQ(cm)        LLQ(cm)  6.03          2.81          7.66           5.63 ---------------------------------------------------------------------- Biophysical Evaluation  Amniotic F.V:   Pocket => 2 cm two         F. Tone:        Observed                  planes  F. Movement:    Observed                   Score:          8/8  F. Breathing:   Observed ---------------------------------------------------------------------- Biometry  BPD:        69  mm     G. Age:  27w 5d         10  %    CI:        73.96   %    70 - 86                                                          FL/HC:      21.8   %    19.6 - 20.8  HC:      254.8  mm     G. Age:  27w 5d        < 3  %    HC/AC:      0.99        0.99 - 1.21  AC:      258.4  mm     G. Age:  30w 0d         77  %    FL/BPD:     80.6   %    71 - 87  FL:       55.6  mm     G. Age:  29w 2d         48  %    FL/AC:      21.5   %    20 - 24  Est. FW:    1380  gm      3 lb 1 oz     61  % ---------------------------------------------------------------------- OB History  Gravidity:    4         Term:   2  TOP:          1        Living:  2 ---------------------------------------------------------------------- Gestational Age  LMP:           28w 6d        Date:  12/04/17                 EDD:   09/10/18  U/S Today:     28w 5d  EDD:   09/11/18  Best:          28w 6d     Det. By:  LMP  (12/04/17)          EDD:   09/10/18  ---------------------------------------------------------------------- Anatomy  Cranium:               Appears normal         Aortic Arch:            Previously seen  Cavum:                 Appears normal         Ductal Arch:            Previously seen  Ventricles:            Appears normal         Diaphragm:              Appears normal  Choroid Plexus:        Previously seen        Stomach:                Appears normal, left                                                                        sided  Cerebellum:            Previously seen        Abdomen:                Appears normal  Posterior Fossa:       Previously seen        Abdominal Wall:         Previously seen  Nuchal Fold:           Not applicable (>20    Cord Vessels:           Previously seen                         wks GA)  Face:                  Orbits and profile     Kidneys:                Appear normal                         previously seen  Lips:                  Appears normal         Bladder:                Appears normal  Thoracic:              Appears normal         Spine:                  Previously seen  Heart:                 Appears normal         Upper Extremities:  Previously seen                         (4CH, axis, and situs  RVOT:                  Not well visualized    Lower Extremities:      Previously seen  LVOT:                  Previously seen  Other:  Female gender. Heels, Feet, and Lenses prev visualized. Technically          difficult due to maternal habitus and fetal position. ---------------------------------------------------------------------- Cervix Uterus Adnexa  Cervix  Not visualized (advanced GA >24wks) ---------------------------------------------------------------------- Comments  U/S images reviewed. Findings reviewed with patient.  Appropriate fetal growth is noted.  No fetal abnormalities are  identified.  No evidence of fetal compromise is found on BPP  today.  Patient's chart reflects poor BS control.   General counseling regarding poorly controlled Diabetes  (DM) and the increased risks for congenital malformations for  pre-existing Diabetes - especially CNS and cardiac  malformations, fetal macrosomia  and birth trauma (early DM)  /IUGR (more advance DM), perinatal morbidity and mortality,  neonatal hypoglycemia and hyperbilirubinemia and delayed  lung maturity was performed.  The roles of serial U/S for  growth and close A-P surveillance beginning in the 3rd  trimester as well as a description of Biophysical Profile testing  were discussed as well.  QID blood glucose testing should be  performed. FBS < 95 mg % and 2 Hr PP < 120 mg% should  be maintained.  Insulin/oral hypoglycemic requiring patients  should not go beyond their due date.  Pre-existing diabetics  should undergo fetal ECHO @ 22 weeks.  Questions answered.  15 minutes spent face to face with patient.  Recommendations: 1) Weekly BPP 2) Serial U/S every 4  weeks for fetal growth ---------------------------------------------------------------------- Recommendations   1) Weekly BPP 2) Serial U/S every 4 weeks for fetal growth ----------------------------------------------------------------------               Patsi Sears, MD Electronically Signed Final Report   06/24/2018 10:01 am ----------------------------------------------------------------------  [place] Mfm Ob Follow Up  Result Date: 06/24/2018 ----------------------------------------------------------------------  OBSTETRICS REPORT                       (Signed Final 06/24/2018 10:01 am) ---------------------------------------------------------------------- Patient Info  ID #:       [id number]                          D.O.B.:  07-23-1985 (32 yrs)  Name:       [name]             Visit Date: 06/24/2018 09:25 am ---------------------------------------------------------------------- Performed By  Performed By:     Emeline Darling BS,      Ref. Address:     [address]                     [facility]  [address]                                                             [place], [place]                                                             [address]  Attending:        Patsi Sears      Location:         [facility]                    MD  Referred By:      Tereso Newcomer MD ---------------------------------------------------------------------- Orders   #  Description                          Code         Ordered By   1  [place] MFM OB FOLLOW UP                  [phone]     Adele Dan   2  [place] MFM FETAL BPP WO NON              76819.01     Adele Dan      STRESS  ----------------------------------------------------------------------   #  Order #                    Accession #                 Episode #   1  [id number]                  [id number]   2  [id number]                  [id number]                  [id number]  ---------------------------------------------------------------------- Indications   Poor obstetric history: Previous               O09.299   preeclampsia / eclampsia/gestational HTN   Asthma (Mild, Intermittent)                    O99.89 j45.909   Pre-existing diabetes, type 1, in pregnancy,   O24.012   second trimester (Novolog)   Antenatal follow-up for nonvisualized fetal    Z36.2   anatomy   [redacted] weeks gestation of pregnancy                Z3A.28   Obesity complicating pregnancy, second         O99.212   trimester (pre-pregnancy BMI 35)  ---------------------------------------------------------------------- Vital Signs  Weight (lb): 234  Height:        5'9"  BMI:         34.55 ---------------------------------------------------------------------- Fetal Evaluation  Num Of Fetuses:         1  Fetal Heart Rate(bpm):  148  Cardiac Activity:       Observed  Presentation:           Cephalic  Placenta:               Posterior  P. Cord Insertion:       Previously Visualized  Amniotic Fluid  AFI FV:      Within normal limits  AFI Sum(cm)     %Tile       Largest Pocket(cm)  22.13           90          7.66  RUQ(cm)       RLQ(cm)       LUQ(cm)        LLQ(cm)  6.03          2.81          7.66           5.63 ---------------------------------------------------------------------- Biophysical Evaluation  Amniotic F.V:   Pocket => 2 cm two         F. Tone:        Observed                  planes  F. Movement:    Observed                   Score:          8/8  F. Breathing:   Observed ---------------------------------------------------------------------- Biometry  BPD:        69  mm     G. Age:  27w 5d         10  %    CI:        73.96   %    70 - 86                                                          FL/HC:      21.8   %    19.6 - 20.8  HC:      254.8  mm     G. Age:  27w 5d        < 3  %    HC/AC:      0.99        0.99 - 1.21  AC:      258.4  mm     G. Age:  30w 0d         77  %    FL/BPD:     80.6   %    71 - 87  FL:       55.6  mm     G. Age:  29w 2d         48  %    FL/AC:      21.5   %    20 - 24  Est. FW:    1380  gm      3 lb 1 oz     61  % ---------------------------------------------------------------------- OB History  Gravidity:    4         Term:   2  TOP:          1        Living:  2 ---------------------------------------------------------------------- Gestational Age  LMP:           28w 6d        Date:  12/04/17                 EDD:   09/10/18  U/S Today:     28w 5d                                        EDD:   09/11/18  Best:          28w 6d     Det. By:  LMP  (12/04/17)          EDD:   09/10/18 ---------------------------------------------------------------------- Anatomy  Cranium:               Appears normal         Aortic Arch:            Previously seen  Cavum:                 Appears normal         Ductal Arch:            Previously seen  Ventricles:            Appears normal         Diaphragm:              Appears normal  Choroid Plexus:         Previously seen        Stomach:                Appears normal, left                                                                        sided  Cerebellum:            Previously seen        Abdomen:                Appears normal  Posterior Fossa:       Previously seen        Abdominal Wall:         Previously seen  Nuchal Fold:           Not applicable (>20    Cord Vessels:           Previously seen                         wks GA)  Face:                  Orbits and profile     Kidneys:                Appear normal  previously seen  Lips:                  Appears normal         Bladder:                Appears normal  Thoracic:              Appears normal         Spine:                  Previously seen  Heart:                 Appears normal         Upper Extremities:      Previously seen                         (4CH, axis, and situs  RVOT:                  Not well visualized    Lower Extremities:      Previously seen  LVOT:                  Previously seen  Other:  Female gender. Heels, Feet, and Lenses prev visualized. Technically          difficult due to maternal habitus and fetal position. ---------------------------------------------------------------------- Cervix Uterus Adnexa  Cervix  Not visualized (advanced GA >24wks) ---------------------------------------------------------------------- Comments  U/S images reviewed. Findings reviewed with patient.  Appropriate fetal growth is noted.  No fetal abnormalities are  identified.  No evidence of fetal compromise is found on BPP  today.  Patient's chart reflects poor BS control.  General counseling regarding poorly controlled Diabetes  (DM) and the increased risks for congenital malformations for  pre-existing Diabetes - especially CNS and cardiac  malformations, fetal macrosomia  and birth trauma (early DM)  /IUGR (more advance DM), perinatal morbidity and mortality,  neonatal hypoglycemia and hyperbilirubinemia and delayed  lung maturity  was performed.  The roles of serial U/S for  growth and close A-P surveillance beginning in the 3rd  trimester as well as a description of Biophysical Profile testing  were discussed as well.  QID blood glucose testing should be  performed. FBS < 95 mg % and 2 Hr PP < 120 mg% should  be maintained.  Insulin/oral hypoglycemic requiring patients  should not go beyond their due date.  Pre-existing diabetics  should undergo fetal ECHO @ 22 weeks.  Questions answered.  15 minutes spent face to face with patient.  Recommendations: 1) Weekly BPP 2) Serial U/S every 4  weeks for fetal growth ---------------------------------------------------------------------- Recommendations   1) Weekly BPP 2) Serial U/S every 4 weeks for fetal growth ----------------------------------------------------------------------               Patsi Sears, MD Electronically Signed Final Report   06/24/2018 10:01 am ----------------------------------------------------------------------    Assessment and Plan: Patient Active Problem List   Diagnosis Date Noted  . Type 1 diabetes mellitus in pregnancy 07/12/2018  . DKA (diabetic ketoacidoses) (HCC) 07/11/2018  . 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 Preexisting diabetes complicating pregnancy, antepartum 02/11/2018  . Supervision of high-risk pregnancy 02/11/2018  . History of loop electrical excision procedure (LEEP) 09/23/2017  . Back pain 04/27/2016  . Abnormal Papanicolaou smear of cervix with positive human papilloma virus (HPV) test 09/02/2015  . Adjustment disorder with depressed mood 08/03/2015  .  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 Asthma, mild intermittent 07/06/2013  . Type I diabetes mellitus with complication, uncontrolled (HCC) 06/18/2011  . History of migraine during pregnancy 06/18/2011  . Migraine 06/18/2011   Admit to Antenatal Third liter IV fluids ordered Glucose stabilizer ordered. UDS ordered Continuous EFM Growth u/s in  am. Recheck cbc, cmet, hgb A1C at 5 am. [email or web address] Attending Obstetrician & Gynecologist Faculty Practice, [facility]

## 2018-07-12 NOTE — Progress Notes (Signed)
Pt set up insulin pump.

## 2018-07-12 NOTE — Progress Notes (Signed)
Called Dr. Macon Large to ask if pt still needed our sliding scale insulin now that she has her pump set up. Dr. Macon Large agrees that as long as the pt's pump instructs how much insulin to give, we can d/c our order for subQ insulin and use the pump to bolus insulin instead. Spoke with pt and she explained that the pump will tell how much insulin to bolus based on our CBG reading. Will d/c order for novolog sliding scale.   Arva Chafe, RN

## 2018-07-12 NOTE — Progress Notes (Signed)
Faculty Practice OB/GYN Attending Note  Subjective:  No complaints. Doing well after hours on insulin drip.  CBG currently 180.  FHR reassuring, no contractions, no LOF or vaginal bleeding. Good FM.      Objective:  Blood pressure 113/74, pulse (!) 101, temperature 98.5 F (36.9 C), resp. rate 18, height 5\' 9"  (1.753 m), weight 104.8 kg, last menstrual period 12/04/2017, SpO2 100 %, unknown if currently breastfeeding. FHT  Baseline 135 bpm, moderate variability, +accelerations, no decelerations Toco: No contractions Gen: NAD HENT: Normocephalic, atraumatic Lungs: Normal respiratory effort Heart: Regular rate noted Abdomen: NT,gravid fundus, soft Cervix: Deferred Ext: 2+ DTRs, no edema, no cyanosis, negative Homan's sign   Results for orders placed or performed during the hospital encounter of 07/11/18 (from the past 24 hour(s))  CBG monitoring, ED     Status: Abnormal   Collection Time: 07/11/18  5:13 PM  Result Value Ref Range   Glucose-Capillary 354 (H) 70 - 99 mg/dL  Basic metabolic panel     Status: Abnormal   Collection Time: 07/11/18  6:00 PM  Result Value Ref Range   Sodium 128 (L) 135 - 145 mmol/L   Potassium 4.0 3.5 - 5.1 mmol/L   Chloride 100 98 - 111 mmol/L   CO2 16 (L) 22 - 32 mmol/L   Glucose, Bld 335 (H) 70 - 99 mg/dL   BUN 6 6 - 20 mg/dL   Creatinine, Ser 4.090.95 0.44 - 1.00 mg/dL   Calcium 8.9 8.9 - 81.110.3 mg/dL   GFR calc non Af Amer >60 >60 mL/min   GFR calc Af Amer >60 >60 mL/min   Anion gap 12 5 - 15  CBC with Differential     Status: Abnormal   Collection Time: 07/11/18  6:00 PM  Result Value Ref Range   WBC 5.5 4.0 - 10.5 K/uL   RBC 5.32 (H) 3.87 - 5.11 MIL/uL   Hemoglobin 13.1 12.0 - 15.0 g/dL   HCT 91.442.3 78.236.0 - 95.646.0 %   MCV 79.5 (L) 80.0 - 100.0 fL   MCH 24.6 (L) 26.0 - 34.0 pg   MCHC 31.0 30.0 - 36.0 g/dL   RDW 21.314.3 08.611.5 - 57.815.5 %   Platelets 183 150 - 400 K/uL   nRBC 0.0 0.0 - 0.2 %   Neutrophils Relative % 53 %   Neutro Abs 2.9 1.7 - 7.7 K/uL    Lymphocytes Relative 38 %   Lymphs Abs 2.1 0.7 - 4.0 K/uL   Monocytes Relative 9 %   Monocytes Absolute 0.5 0.1 - 1.0 K/uL   Eosinophils Relative 0 %   Eosinophils Absolute 0.0 0.0 - 0.5 K/uL   Basophils Relative 0 %   Basophils Absolute 0.0 0.0 - 0.1 K/uL   Immature Granulocytes 0 %   Abs Immature Granulocytes 0.02 0.00 - 0.07 K/uL  I-Stat venous blood gas, ED     Status: Abnormal   Collection Time: 07/11/18  6:13 PM  Result Value Ref Range   pH, Ven 7.341 7.250 - 7.430   pCO2, Ven 28.9 (L) 44.0 - 60.0 mmHg   pO2, Ven 30.0 (LL) 32.0 - 45.0 mmHg   Bicarbonate 15.6 (L) 20.0 - 28.0 mmol/L   TCO2 16 (L) 22 - 32 mmol/L   O2 Saturation 54.0 %   Acid-base deficit 9.0 (H) 0.0 - 2.0 mmol/L   Patient temperature HIDE    Sample type VENOUS    Comment NOTIFIED PHYSICIAN   Urinalysis, Routine w reflex microscopic     Status:  Abnormal   Collection Time: 07/11/18  7:05 PM  Result Value Ref Range   Color, Urine STRAW (A) YELLOW   APPearance CLEAR CLEAR   Specific Gravity, Urine 1.033 (H) 1.005 - 1.030   pH 5.0 5.0 - 8.0   Glucose, UA >=500 (A) NEGATIVE mg/dL   Hgb urine dipstick NEGATIVE NEGATIVE   Bilirubin Urine NEGATIVE NEGATIVE   Ketones, ur 80 (A) NEGATIVE mg/dL   Protein, ur NEGATIVE NEGATIVE mg/dL   Nitrite NEGATIVE NEGATIVE   Leukocytes, UA NEGATIVE NEGATIVE   RBC / HPF 0-5 0 - 5 RBC/hpf   WBC, UA 0-5 0 - 5 WBC/hpf   Bacteria, UA RARE (A) NONE SEEN   Squamous Epithelial / LPF 0-5 0 - 5   Mucus PRESENT   CBG monitoring, ED     Status: Abnormal   Collection Time: 07/11/18  9:42 PM  Result Value Ref Range   Glucose-Capillary 307 (H) 70 - 99 mg/dL  CBG monitoring, ED     Status: Abnormal   Collection Time: 07/11/18 10:42 PM  Result Value Ref Range   Glucose-Capillary 292 (H) 70 - 99 mg/dL  CBG monitoring, ED     Status: Abnormal   Collection Time: 07/12/18 12:05 AM  Result Value Ref Range   Glucose-Capillary 320 (H) 70 - 99 mg/dL   Comment 1 Notify RN    Comment 2  Document in Chart   CBG monitoring, ED     Status: Abnormal   Collection Time: 07/12/18 12:27 AM  Result Value Ref Range   Glucose-Capillary 302 (H) 70 - 99 mg/dL   Comment 1 Notify RN    Comment 2 Document in Chart   Glucose, capillary     Status: Abnormal   Collection Time: 07/12/18  1:32 AM  Result Value Ref Range   Glucose-Capillary 257 (H) 70 - 99 mg/dL  Urine rapid drug screen (hosp performed)not at [facility]     Status: None   Collection Time: 07/12/18  2:34 AM  Result Value Ref Range   Opiates NONE DETECTED NONE DETECTED   Cocaine NONE DETECTED NONE DETECTED   Benzodiazepines NONE DETECTED NONE DETECTED   Amphetamines NONE DETECTED NONE DETECTED   Tetrahydrocannabinol NONE DETECTED NONE DETECTED   Barbiturates NONE DETECTED NONE DETECTED  Glucose, capillary     Status: Abnormal   Collection Time: 07/12/18  2:39 AM  Result Value Ref Range   Glucose-Capillary 244 (H) 70 - 99 mg/dL  Glucose, capillary     Status: Abnormal   Collection Time: 07/12/18  3:42 AM  Result Value Ref Range   Glucose-Capillary 337 (H) 70 - 99 mg/dL  Glucose, capillary     Status: Abnormal   Collection Time: 07/12/18  5:07 AM  Result Value Ref Range   Glucose-Capillary 266 (H) 70 - 99 mg/dL  Type and screen [facility]     Status: None   Collection Time: 07/12/18  5:31 AM  Result Value Ref Range   ABO/RH(D) A POS    Antibody Screen NEG    Sample Expiration      07/15/2018 Performed at [facility], [address], [place], [place] [address]   Comprehensive metabolic panel     Status: Abnormal   Collection Time: 07/12/18  5:31 AM  Result Value Ref Range   Sodium 128 (L) 135 - 145 mmol/L   Potassium 4.0 3.5 - 5.1 mmol/L   Chloride 104 98 - 111 mmol/L   CO2 11 (L) 22 - 32 mmol/L  Glucose, Bld 298 (H) 70 - 99 mg/dL   BUN 9 6 - 20 mg/dL   Creatinine, Ser 8.67 0.44 - 1.00 mg/dL   Calcium 8.0 (L) 8.9 - 10.3 mg/dL   Total Protein 6.1 (L) 6.5 - 8.1 g/dL   Albumin 2.3 (L) 3.5  - 5.0 g/dL   AST 14 (L) 15 - 41 U/L   ALT 11 0 - 44 U/L   Alkaline Phosphatase 47 38 - 126 U/L   Total Bilirubin 1.2 0.3 - 1.2 mg/dL   GFR calc non Af Amer >60 >60 mL/min   GFR calc Af Amer >60 >60 mL/min   Anion gap 13 5 - 15  CBC on admission     Status: Abnormal   Collection Time: 07/12/18  5:31 AM  Result Value Ref Range   WBC 5.4 4.0 - 10.5 K/uL   RBC 4.80 3.87 - 5.11 MIL/uL   Hemoglobin 12.3 12.0 - 15.0 g/dL   HCT 67.2 09.4 - 70.9 %   MCV 78.8 (L) 80.0 - 100.0 fL   MCH 25.6 (L) 26.0 - 34.0 pg   MCHC 32.5 30.0 - 36.0 g/dL   RDW 62.8 36.6 - 29.4 %   Platelets 172 150 - 400 K/uL   nRBC 0.0 0.0 - 0.2 %  Hemoglobin A1c     Status: Abnormal   Collection Time: 07/12/18  5:31 AM  Result Value Ref Range   Hgb A1c MFr Bld 12.0 (H) 4.8 - 5.6 %   Mean Plasma Glucose 297.7 mg/dL  Glucose, capillary     Status: Abnormal   Collection Time: 07/12/18  6:11 AM  Result Value Ref Range   Glucose-Capillary 241 (H) 70 - 99 mg/dL  Glucose, capillary     Status: Abnormal   Collection Time: 07/12/18  7:17 AM  Result Value Ref Range   Glucose-Capillary 232 (H) 70 - 99 mg/dL  Glucose, capillary     Status: Abnormal   Collection Time: 07/12/18  8:17 AM  Result Value Ref Range   Glucose-Capillary 222 (H) 70 - 99 mg/dL  Glucose, capillary     Status: Abnormal   Collection Time: 07/12/18  9:20 AM  Result Value Ref Range   Glucose-Capillary 186 (H) 70 - 99 mg/dL   Comment 1 Notify RN     Assessment & Plan:  33 y.o. [id number] at [redacted]w[redacted]d admitted for compensated DKA, now with improved glycemic control.  Awaiting insulin pump restarting.  Will discontinue insulin pump for now, place on sliding scale. Continue DM diet. Once insulin pump arrives in a couple of hours, will switch to that modality. Continue close observation.  [name], MD, FACOG Obstetrician & Gynecologist, [facility] for [employer], [facility]

## 2018-07-13 DIAGNOSIS — E1010 Type 1 diabetes mellitus with ketoacidosis without coma: Secondary | ICD-10-CM

## 2018-07-13 LAB — GLUCOSE, CAPILLARY
Glucose-Capillary: 196 mg/dL — ABNORMAL HIGH (ref 70–99)
Glucose-Capillary: 173 mg/dL — ABNORMAL HIGH (ref 70–99)
Glucose-Capillary: 160 mg/dL — ABNORMAL HIGH (ref 70–99)

## 2018-07-13 MED ORDER — ONDANSETRON 4 MG PO TBDP: 4.0000 mg | ORAL_TABLET | Freq: Four times a day (QID) | ORAL | 2 refills | Status: DC | PRN

## 2018-07-13 NOTE — Progress Notes (Signed)
CBG: 300  Insulin dose: pump instructed 11.3 units, patient had received 8.8 units already, bolused 2.5 units.

## 2018-07-13 NOTE — Progress Notes (Signed)
Discharge instructions and prescriptions given to pt. Discussed diabetes control, signs and symptoms of hypo/hyperglycemia, importance of continuing insulin pump, signs and symptoms to report to the MD, upcoming appointments, and meds.  Pt verbalizes understanding and has no questions or concerns at this time. Abigail Butts, RN, was the second reviewer of the NST strip. Pt discharged from hospital in stable condition.

## 2018-07-13 NOTE — Progress Notes (Signed)
CBG: 196  Insulin dose: 4.4 unit bolus

## 2018-07-13 NOTE — Discharge Instructions (Signed)
Preventing Diabetic Ketoacidosis °Diabetic ketoacidosis (DKA) is a life-threatening complication of diabetes (diabetes mellitus). It develops when there is not enough of a hormone called insulin in the body. If the body does not have enough insulin, it cannot divide (break down) sugar (glucose) into usable cells, so it breaks down fats instead. This leads to the production of acids (ketones), which can cause the blood to have too much acid in it (acidosis). DKA is a medical emergency that must be treated at the hospital. °You may be more likely to develop DKA if you have type 1 diabetes and you take insulin. You can prevent DKA by working closely with your health care provider to manage your diabetes. °What nutrition changes can be made? ° °· Follow your meal plan, as directed by your health care provider or diet and nutrition specialist (dietitian). °· Eat healthy meals at about the same time every day. Have healthy snacks between meals. °· Avoid not eating for long periods of time. Do not skip meals, especially if you are ill. °· Avoid regularly eating foods that contain a lot of su °·  °·  °·  °·  °·  °·  °·  ° °· gar. Also avoid drinking alcohol. Sugary food and alcohol increase your risk of high blood glucose (hyperglycemia), which increases your risk for DKA. °· Drink enough fluid to keep your urine pale yellow. Dehydration increases your risk for DKA. °What actions can I take to lower my risk? °To lower your risk for diabetic ketoacidosis, manage your diabetes as directed by your health care provider: °· Take insulin and other diabetes medicines as directed. °· Check your blood glucose every day, as often as directed. °· Follow your sick day plan whenever you cannot eat or drink as usual. Make this plan in advance with your health care provider. °· Check your urine for ketones as often as directed. °? During times when you are sick, check your ketones every 4-6 hours. °? If you develop symptoms of DKA, check  your ketones right away. °· If you have ketones in your urine: °? Contact your health care provider right away. °? Do not exercise. °· Know the symptoms of DKA so that you can get treatment as soon as possible. °· Make sure that people at work, home, and school know how to check your blood glucose, in case you are not able to do it yourself. °· Carry a medical alert card or wear medical alert jewelry that says that you have diabetes. °Why are these changes important? °DKA is a warning sign that your diabetes is not being well-controlled. You may need to work with your health care provider to adjust your diabetes management plan. DKA can lead to a serious medical emergency that can be life-threatening. °Where to find support °For more support with preventing DKA: °· Talk with your health care provider. °· Consider joining a support group. The [employer] has an online support community at: community.diabetes.org/home °Where to find more information °Learn more about preventing DKA from: °· [employer] Association: www.diabetes.org °· [employer] Association: www.heart.org °Contact a health care provider if: °You develop symptoms of DKA, such as: °· Fatigue. °· Weight loss. °· Excessive thirst. °· Light-headedness. °· Fruity or sweet-smelling breath. °· Excessive urination. °· Vision changes. °· Confusion or irritability. °· Nausea. °· Vomiting. °· Rapid breathing. °· Pain in the abdomen. °· Feeling warm in your face (flushed). This may or may not include a reddish color coming to your   face. °If you develop any of these symptoms, do not wait to see if the symptoms will go away. Get medical help right away. Call your local emergency services (911 in the [place] Do not drive yourself to the hospital. °Summary °· DKA may be a warning sign that your diabetes is not being well-controlled. You may need to work with your health care provider to adjust your diabetes management plan. °· Preventing  high blood glucose and dehydration helps prevent DKA. °· Check your urine for ketones as often as directed. You may need to check more often when your blood glucose level is high and when you are ill. °· DKA is a medical emergency. Make sure you know the symptoms so that you can recognize and get treatment right away. °This information is not intended to replace advice given to you by your health care provider. Make sure you discuss any questions you have with your health care provider. °Document Released: 01/24/2017 Document Revised: 01/24/2017 Document Reviewed: 01/24/2017 °[employer] Interactive Patient Education © 2019 [employer] ° °

## 2018-07-13 NOTE — Discharge Summary (Signed)
Antenatal Physician Discharge Summary  Patient ID: Christie [name] MRN: [id number] DOB/AGE: 1985/08/29 33 y.o.  Admit date: 07/11/2018 Discharge date: 07/13/2018  Admission Diagnoses:  Active Problems:   Type I diabetes mellitus with complication, uncontrolled (HCC)   DKA (diabetic ketoacidoses) (HCC)   Type 1 diabetes mellitus in pregnancy  Discharge Diagnoses:  Resolved/compensated DKA; otherwise the same as above.  Prenatal Procedures: NST  Hospital Course:  This is a 33 y.o. [id number] with IUP at [redacted]w[redacted]d admitted for observation after she was noted to be hyperglycemic to 350s and in likley compensated diabetic ketoacidosis.  She is Type I DM managed on insulin pump; had stopped administering insulin for a few days prior to admission because she was "not feeling well".  On admission, was started on Glucostabilizer, then transitioned to her insulin pump when her CBGs were in the 100s.  She was noted to ne very compliant with her diet, had one CBG 300 thirty minutes after a large carbohydrate laden meal. Patient denies any other concerning symptoms, discharge CBG was 160.  She managed her pump settings and gave appropriate boluses as instructed by her Endocrinologist, will follow up with them this week. Has prenatal appointment in two days.  She had no signs/symptoms of progressing preterm labor or other maternal-fetal concerns.  She was deemed stable for discharge to home with outpatient follow up.  Discharge Exam: Temp:  [97.9 F (36.6 C)-98.9 F (37.2 C)] 97.9 F (36.6 C) (01/05 0407) Pulse Rate:  [79-98] 98 (01/05 0407) Resp:  [16-18] 18 (01/05 0407) BP: (97-112)/(59-82) 98/76 (01/05 0407) SpO2:  [98 %-100 %] 100 % (01/05 0407) Physical Examination: CONSTITUTIONAL: Well-developed, well-nourished female in no acute distress.  HENT:  Normocephalic, atraumatic, External right and left ear normal. Oropharynx is clear and moist EYES: Conjunctivae and EOM are normal. Pupils are equal,  round, and reactive to light. No scleral icterus.  NECK: Normal range of motion, supple, no masses SKIN: Skin is warm and dry. No rash noted. Not diaphoretic. No erythema. No pallor. NEUROLGIC: Alert and oriented to person, place, and time. Normal reflexes, muscle tone coordination. No cranial nerve deficit noted. PSYCHIATRIC: Normal mood and affect. Normal behavior. Normal judgment and thought content. CARDIOVASCULAR: Normal heart rate noted, regular rhythm RESPIRATORY: Effort and breath sounds normal, no problems with respiration noted MUSCULOSKELETAL: Normal range of motion. No edema and no tenderness. 2+ distal pulses. ABDOMEN: Soft, nontender, nondistended, gravid. CERVIX:  Deferred  Fetal monitoring: FHR: 130 bpm, Variability: moderate, Accelerations: Present, Decelerations: Absent  Uterine activity: No contractions  Significant Diagnostic Studies:  Results for orders placed or performed during the hospital encounter of 07/11/18 (from the past 168 hour(s))  CBG monitoring, ED   Collection Time: 07/11/18  5:13 PM  Result Value Ref Range   Glucose-Capillary 354 (H) 70 - 99 mg/dL  Basic metabolic panel   Collection Time: 07/11/18  6:00 PM  Result Value Ref Range   Sodium 128 (L) 135 - 145 mmol/L   Potassium 4.0 3.5 - 5.1 mmol/L   Chloride 100 98 - 111 mmol/L   CO2 16 (L) 22 - 32 mmol/L   Glucose, Bld 335 (H) 70 - 99 mg/dL   BUN 6 6 - 20 mg/dL   Creatinine, Ser 9.79 0.44 - 1.00 mg/dL   Calcium 8.9 8.9 - 48.0 mg/dL   GFR calc non Af Amer >60 >60 mL/min   GFR calc Af Amer >60 >60 mL/min   Anion gap 12 5 - 15  CBC with Differential  Collection Time: 07/11/18  6:00 PM  Result Value Ref Range   WBC 5.5 4.0 - 10.5 K/uL   RBC 5.32 (H) 3.87 - 5.11 MIL/uL   Hemoglobin 13.1 12.0 - 15.0 g/dL   HCT 29.5 62.1 - 30.8 %   MCV 79.5 (L) 80.0 - 100.0 fL   MCH 24.6 (L) 26.0 - 34.0 pg   MCHC 31.0 30.0 - 36.0 g/dL   RDW 65.7 84.6 - 96.2 %   Platelets 183 150 - 400 K/uL   nRBC 0.0 0.0 -  0.2 %   Neutrophils Relative % 53 %   Neutro Abs 2.9 1.7 - 7.7 K/uL   Lymphocytes Relative 38 %   Lymphs Abs 2.1 0.7 - 4.0 K/uL   Monocytes Relative 9 %   Monocytes Absolute 0.5 0.1 - 1.0 K/uL   Eosinophils Relative 0 %   Eosinophils Absolute 0.0 0.0 - 0.5 K/uL   Basophils Relative 0 %   Basophils Absolute 0.0 0.0 - 0.1 K/uL   Immature Granulocytes 0 %   Abs Immature Granulocytes 0.02 0.00 - 0.07 K/uL  I-Stat venous blood gas, ED   Collection Time: 07/11/18  6:13 PM  Result Value Ref Range   pH, Ven 7.341 7.250 - 7.430   pCO2, Ven 28.9 (L) 44.0 - 60.0 mmHg   pO2, Ven 30.0 (LL) 32.0 - 45.0 mmHg   Bicarbonate 15.6 (L) 20.0 - 28.0 mmol/L   TCO2 16 (L) 22 - 32 mmol/L   O2 Saturation 54.0 %   Acid-base deficit 9.0 (H) 0.0 - 2.0 mmol/L   Patient temperature HIDE    Sample type VENOUS    Comment NOTIFIED PHYSICIAN   Urine culture   Collection Time: 07/11/18  7:05 PM  Result Value Ref Range   Specimen Description URINE, RANDOM    Special Requests NONE    Culture      NO GROWTH Performed at [facility], [address], [place], [place] [address]    Report Status 07/12/2018 FINAL   Urinalysis, Routine w reflex microscopic   Collection Time: 07/11/18  7:05 PM  Result Value Ref Range   Color, Urine STRAW (A) YELLOW   APPearance CLEAR CLEAR   Specific Gravity, Urine 1.033 (H) 1.005 - 1.030   pH 5.0 5.0 - 8.0   Glucose, UA >=500 (A) NEGATIVE mg/dL   Hgb urine dipstick NEGATIVE NEGATIVE   Bilirubin Urine NEGATIVE NEGATIVE   Ketones, ur 80 (A) NEGATIVE mg/dL   Protein, ur NEGATIVE NEGATIVE mg/dL   Nitrite NEGATIVE NEGATIVE   Leukocytes, UA NEGATIVE NEGATIVE   RBC / HPF 0-5 0 - 5 RBC/hpf   WBC, UA 0-5 0 - 5 WBC/hpf   Bacteria, UA RARE (A) NONE SEEN   Squamous Epithelial / LPF 0-5 0 - 5   Mucus PRESENT   CBG monitoring, ED   Collection Time: 07/11/18  9:42 PM  Result Value Ref Range   Glucose-Capillary 307 (H) 70 - 99 mg/dL  CBG monitoring, ED   Collection Time:  07/11/18 10:42 PM  Result Value Ref Range   Glucose-Capillary 292 (H) 70 - 99 mg/dL  CBG monitoring, ED   Collection Time: 07/12/18 12:05 AM  Result Value Ref Range   Glucose-Capillary 320 (H) 70 - 99 mg/dL   Comment 1 Notify RN    Comment 2 Document in Chart   CBG monitoring, ED   Collection Time: 07/12/18 12:27 AM  Result Value Ref Range   Glucose-Capillary 302 (H) 70 - 99 mg/dL  Comment 1 Notify RN    Comment 2 Document in Chart   Glucose, capillary   Collection Time: 07/12/18  1:32 AM  Result Value Ref Range   Glucose-Capillary 257 (H) 70 - 99 mg/dL  Urine rapid drug screen (hosp performed)not at [facility]   Collection Time: 07/12/18  2:34 AM  Result Value Ref Range   Opiates NONE DETECTED NONE DETECTED   Cocaine NONE DETECTED NONE DETECTED   Benzodiazepines NONE DETECTED NONE DETECTED   Amphetamines NONE DETECTED NONE DETECTED   Tetrahydrocannabinol NONE DETECTED NONE DETECTED   Barbiturates NONE DETECTED NONE DETECTED  Glucose, capillary   Collection Time: 07/12/18  2:39 AM  Result Value Ref Range   Glucose-Capillary 244 (H) 70 - 99 mg/dL  Glucose, capillary   Collection Time: 07/12/18  3:42 AM  Result Value Ref Range   Glucose-Capillary 337 (H) 70 - 99 mg/dL  Glucose, capillary   Collection Time: 07/12/18  5:07 AM  Result Value Ref Range   Glucose-Capillary 266 (H) 70 - 99 mg/dL  Comprehensive metabolic panel   Collection Time: 07/12/18  5:31 AM  Result Value Ref Range   Sodium 128 (L) 135 - 145 mmol/L   Potassium 4.0 3.5 - 5.1 mmol/L   Chloride 104 98 - 111 mmol/L   CO2 11 (L) 22 - 32 mmol/L   Glucose, Bld 298 (H) 70 - 99 mg/dL   BUN 9 6 - 20 mg/dL   Creatinine, Ser 2.11 0.44 - 1.00 mg/dL   Calcium 8.0 (L) 8.9 - 10.3 mg/dL   Total Protein 6.1 (L) 6.5 - 8.1 g/dL   Albumin 2.3 (L) 3.5 - 5.0 g/dL   AST 14 (L) 15 - 41 U/L   ALT 11 0 - 44 U/L   Alkaline Phosphatase 47 38 - 126 U/L   Total Bilirubin 1.2 0.3 - 1.2 mg/dL   GFR calc non Af Amer >60 >60 mL/min    GFR calc Af Amer >60 >60 mL/min   Anion gap 13 5 - 15  CBC on admission   Collection Time: 07/12/18  5:31 AM  Result Value Ref Range   WBC 5.4 4.0 - 10.5 K/uL   RBC 4.80 3.87 - 5.11 MIL/uL   Hemoglobin 12.3 12.0 - 15.0 g/dL   HCT 15.5 20.8 - 02.2 %   MCV 78.8 (L) 80.0 - 100.0 fL   MCH 25.6 (L) 26.0 - 34.0 pg   MCHC 32.5 30.0 - 36.0 g/dL   RDW 33.6 12.2 - 44.9 %   Platelets 172 150 - 400 K/uL   nRBC 0.0 0.0 - 0.2 %  Hemoglobin A1c   Collection Time: 07/12/18  5:31 AM  Result Value Ref Range   Hgb A1c MFr Bld 12.0 (H) 4.8 - 5.6 %   Mean Plasma Glucose 297.7 mg/dL  Type and screen [facility]   Collection Time: 07/12/18  5:31 AM  Result Value Ref Range   ABO/RH(D) A POS    Antibody Screen NEG    Sample Expiration      07/15/2018 Performed at [facility], [address], [place], [place] [address]   Glucose, capillary   Collection Time: 07/12/18  6:11 AM  Result Value Ref Range   Glucose-Capillary 241 (H) 70 - 99 mg/dL  Glucose, capillary   Collection Time: 07/12/18  7:17 AM  Result Value Ref Range   Glucose-Capillary 232 (H) 70 - 99 mg/dL  Glucose, capillary   Collection Time: 07/12/18  8:17 AM  Result Value Ref  Range   Glucose-Capillary 222 (H) 70 - 99 mg/dL  Glucose, capillary   Collection Time: 07/12/18  9:20 AM  Result Value Ref Range   Glucose-Capillary 186 (H) 70 - 99 mg/dL   Comment 1 Notify RN   Glucose, capillary   Collection Time: 07/12/18 11:02 AM  Result Value Ref Range   Glucose-Capillary 199 (H) 70 - 99 mg/dL  Glucose, capillary   Collection Time: 07/12/18  3:06 PM  Result Value Ref Range   Glucose-Capillary 248 (H) 70 - 99 mg/dL  Glucose, capillary   Collection Time: 07/12/18  6:50 PM  Result Value Ref Range   Glucose-Capillary 210 (H) 70 - 99 mg/dL  Glucose, capillary   Collection Time: 07/12/18 11:00 PM  Result Value Ref Range   Glucose-Capillary 300 (H) 70 - 99 mg/dL  Glucose, capillary   Collection Time: 07/13/18   3:02 AM  Result Value Ref Range   Glucose-Capillary 196 (H) 70 - 99 mg/dL  Glucose, capillary   Collection Time: 07/13/18  4:09 AM  Result Value Ref Range   Glucose-Capillary 173 (H) 70 - 99 mg/dL  Glucose, capillary   Collection Time: 07/13/18  6:53 AM  Result Value Ref Range   Glucose-Capillary 160 (H) 70 - 99 mg/dL   Ct Angio Chest Pe W And/or Wo Contrast  Result Date: 06/13/2018 CLINICAL DATA:  33 year old female with shortness of breath. History of asthma. EXAM: CT ANGIOGRAPHY CHEST WITH CONTRAST TECHNIQUE: Multidetector CT imaging of the chest was performed using the standard protocol during bolus administration of intravenous contrast. Multiplanar CT image reconstructions and MIPs were obtained to evaluate the vascular anatomy. CONTRAST:  ISOVUE-370 IOPAMIDOL (ISOVUE-370) INJECTION 76% COMPARISON:  Chest radiograph dated 07/08/2008 FINDINGS: Cardiovascular: There is no cardiomegaly or pericardial effusion. The thoracic aorta is unremarkable. Mildly dilated main pulmonary trunk may represent a degree of pulmonary hypertension. Evaluation of the pulmonary arteries is somewhat limited due to suboptimal opacification and timing of the contrast. No central pulmonary artery embolus identified. Mediastinum/Nodes: There is no hilar or mediastinal adenopathy. Esophagus is grossly unremarkable. No mediastinal fluid collection. Lungs/Pleura: The lungs are clear. There is no pleural effusion or pneumothorax. The central airways are patent. Upper Abdomen: No acute abnormality. Musculoskeletal: No chest wall abnormality. No acute or significant osseous findings. Review of the MIP images confirms the above findings. IMPRESSION: No acute intrathoracic pathology. No CT evidence of central pulmonary artery embolus. Electronically Signed   By: Elgie Collard M.D.   On: 06/13/2018 23:54   [place] Mfm Fetal Bpp Wo Non Stress  Result Date:  07/08/2018 ----------------------------------------------------------------------  OBSTETRICS REPORT                        (Signed Final 07/08/2018 05:22 am) ---------------------------------------------------------------------- Patient Info  ID #:       [id number]                          D.O.B.:  09-Feb-1986 (32 yrs)  Name:       Christie [name]             Visit Date: 07/07/2018 10:51 am ---------------------------------------------------------------------- Performed By  Performed By:     Lenise Arena        Ref. Address:      [address]                    [facility]  [address]                                                              [place], [place]                                                              [address]  Attending:        Lin Landsman      Location:          [facility]                    MD  Referred By:      Tereso Newcomer MD ---------------------------------------------------------------------- Orders   #  Description                          Code         Ordered By   1  [place] MFM FETAL BPP WO NON              98119.14     ROBERT JACOBSON      STRESS  ----------------------------------------------------------------------   #  Order #                    Accession #                 Episode #   1  [id number]                  [id number]                  [id number]  ---------------------------------------------------------------------- Indications   Pre-existing diabetes, type 1, in pregnancy,   O24.013   third trimester (Novolog)   Poor obstetric history: Previous               O09.299   preeclampsia / eclampsia/gestational HTN   Asthma (Mild, Intermittent)                    O99.89 j45.909   [redacted] weeks gestation of pregnancy                Z3A.30  ---------------------------------------------------------------------- Vital Signs  Weight (lb): 231                               Height:        5'9"  BMI:         34.11  ---------------------------------------------------------------------- Fetal Evaluation  Num Of Fetuses:          1  Fetal Heart Rate(bpm):   144  Cardiac Activity:        Observed  Presentation:            Cephalic  Placenta:                Posterior  Amniotic Fluid  AFI FV:  Within normal limits  AFI Sum(cm)     %Tile       Largest Pocket(cm)  18.75           71          9.22  RUQ(cm)       RLQ(cm)       LUQ(cm)        LLQ(cm)  9.22          2.87          2.95           3.71 ---------------------------------------------------------------------- Biophysical Evaluation  Amniotic F.V:   Within normal limits       F. Tone:         Observed  F. Movement:    Observed                   Score:           8/8  F. Breathing:   Observed ---------------------------------------------------------------------- OB History  Gravidity:    4         Term:   2  TOP:          1        Living:  2 ---------------------------------------------------------------------- Gestational Age  LMP:           30w 5d        Date:  12/04/17                 EDD:   09/10/18  Best:          30w 5d     Det. By:  LMP  (12/04/17)          EDD:   09/10/18 ---------------------------------------------------------------------- Anatomy  Thoracic:              Appears normal         Abdomen:                Appears normal  Diaphragm:             Appears normal         Kidneys:                Appear normal  Stomach:               Appears normal, left   Bladder:                Appears normal                         sided ---------------------------------------------------------------------- Cervix Uterus Adnexa  Cervix  Not visualized (advanced GA >24wks) ---------------------------------------------------------------------- Impression  Normall Biophysical profile ---------------------------------------------------------------------- Recommendations  Follow up BP scheduled on 1/6 ----------------------------------------------------------------------                Lin Landsmanorenthian Booker, MD Electronically Signed Final Report   07/08/2018 05:22 am ----------------------------------------------------------------------  [place] Mfm Fetal Bpp Wo Non Stress  Result Date: 07/01/2018 ----------------------------------------------------------------------  OBSTETRICS REPORT                       (Signed Final 07/01/2018 12:42 pm) ---------------------------------------------------------------------- Patient Info  ID #:       [id number]                          D.O.B.:  25-Aug-1985 (32 yrs)  Name:       Christie [name]  Visit Date: 07/01/2018 11:06 am ---------------------------------------------------------------------- Performed By  Performed By:     Marcellina Millin          Ref. Address:      [address]                                                              [place], [place]                                                              [address]  Attending:        Lin Landsman      Location:          [facility]                    MD  Referred By:      Tereso Newcomer MD ---------------------------------------------------------------------- Orders   #  Description                          Code         Ordered By   1  [place] MFM FETAL BPP WO NON              60454.09     Adele Dan      STRESS  ----------------------------------------------------------------------   #  Order #                    Accession #                 Episode #   1  [id number]                  [id number]                  [id number]  ---------------------------------------------------------------------- Indications   Pre-existing diabetes, type 1, in pregnancy,   O24.013   third trimester (Novolog)   [redacted] weeks gestation of pregnancy                Z3A.29   Poor obstetric history: Previous               O09.299   preeclampsia / eclampsia/gestational HTN   Asthma (Mild, Intermittent)                     O99.89 j45.909  ---------------------------------------------------------------------- Vital Signs  Height:        5'9" ---------------------------------------------------------------------- Fetal Evaluation  Num Of Fetuses:          1  Fetal Heart Rate(bpm):   140  Cardiac Activity:        Observed  Presentation:            Cephalic  Placenta:                Posterior  P. Cord Insertion:       Previously Visualized  Amniotic Fluid  AFI FV:      Within normal limits  AFI Sum(cm)     %Tile       Largest Pocket(cm)  19.36           75          8.03  RUQ(cm)       RLQ(cm)       LUQ(cm)        LLQ(cm)  8.03          2.77          3.08           5.48 ---------------------------------------------------------------------- Biophysical Evaluation  Amniotic F.V:   Within normal limits       F. Tone:         Observed  F. Movement:    Observed                   Score:           8/8  F. Breathing:   Observed ---------------------------------------------------------------------- OB History  Gravidity:    4         Term:   2  TOP:          1        Living:  2 ---------------------------------------------------------------------- Gestational Age  LMP:           29w 6d        Date:  12/04/17                 EDD:   09/10/18  Best:          29w 6d     Det. By:  LMP  (12/04/17)          EDD:   09/10/18 ---------------------------------------------------------------------- Anatomy  Stomach:               Appears normal, left   Bladder:                Appears normal                         sided ---------------------------------------------------------------------- Impression  Biophysical profile 8/8 ---------------------------------------------------------------------- Recommendations  Follow up scheduled in 1 weeks ----------------------------------------------------------------------               Lin Landsman, MD Electronically Signed Final Report   07/01/2018 12:42 pm  ----------------------------------------------------------------------  [place] Mfm Fetal Bpp Wo Non Stress  Result Date: 06/24/2018 ----------------------------------------------------------------------  OBSTETRICS REPORT                       (Signed Final 06/24/2018 10:01 am) ---------------------------------------------------------------------- Patient Info  ID #:       [id number]                          D.O.B.:  April 23, 1986 (32 yrs)  Name:       Christie Saucier  Caroline [name]             Visit Date: 06/24/2018 09:25 am ---------------------------------------------------------------------- Performed By  Performed By:     Emeline Darling BS,      Ref. Address:     [address]                                                             [place], [place]                                                             [address]  Attending:        Patsi Sears      Location:         [facility]                    MD  Referred By:      Tereso Newcomer MD ---------------------------------------------------------------------- Orders   #  Description                          Code         Ordered By   1  [place] MFM OB FOLLOW UP                  60454.09     Adele Dan   2  [place] MFM FETAL BPP WO NON              76819.01     [facility]      STRESS  ----------------------------------------------------------------------   #  Order #                    Accession #                 Episode #   1  [id number]                  [id number]                  [id number]   2  [id number]                  [id number]                  [id number]  ---------------------------------------------------------------------- Indications   Poor obstetric history: Previous               O09.299   preeclampsia / eclampsia/gestational  HTN   Asthma (Mild, Intermittent)                    O99.89 j45.909   Pre-existing diabetes, type 1, in pregnancy,   O24.012    second trimester (Novolog)   Antenatal follow-up for nonvisualized fetal    Z36.2   anatomy   [redacted] weeks gestation of pregnancy                Z3A.28   Obesity complicating pregnancy, second         O99.212   trimester (pre-pregnancy BMI 35)  ---------------------------------------------------------------------- Vital Signs  Weight (lb): 234                               Height:        5'9"  BMI:         34.55 ---------------------------------------------------------------------- Fetal Evaluation  Num Of Fetuses:         1  Fetal Heart Rate(bpm):  148  Cardiac Activity:       Observed  Presentation:           Cephalic  Placenta:               Posterior  P. Cord Insertion:      Previously Visualized  Amniotic Fluid  AFI FV:      Within normal limits  AFI Sum(cm)     %Tile       Largest Pocket(cm)  22.13           90          7.66  RUQ(cm)       RLQ(cm)       LUQ(cm)        LLQ(cm)  6.03          2.81          7.66           5.63 ---------------------------------------------------------------------- Biophysical Evaluation  Amniotic F.V:   Pocket => 2 cm two         F. Tone:        Observed                  planes  F. Movement:    Observed                   Score:          8/8  F. Breathing:   Observed ---------------------------------------------------------------------- Biometry  BPD:        69  mm     G. Age:  27w 5d         10  %    CI:        73.96   %    70 - 86                                                          FL/HC:      21.8   %    19.6 - 20.8  HC:      254.8  mm     G. Age:  27w 5d        < 3  %    HC/AC:  0.99        0.99 - 1.21  AC:      258.4  mm     G. Age:  30w 0d         77  %    FL/BPD:     80.6   %    71 - 87  FL:       55.6  mm     G. Age:  29w 2d         48  %    FL/AC:      21.5   %    20 - 24  Est. FW:    1380  gm      3 lb 1 oz     61  % ---------------------------------------------------------------------- OB History  Gravidity:    4         Term:   2  TOP:          1        Living:  2  ---------------------------------------------------------------------- Gestational Age  LMP:           28w 6d        Date:  12/04/17                 EDD:   09/10/18  U/S Today:     28w 5d                                        EDD:   09/11/18  Best:          28w 6d     Det. By:  LMP  (12/04/17)          EDD:   09/10/18 ---------------------------------------------------------------------- Anatomy  Cranium:               Appears normal         Aortic Arch:            Previously seen  Cavum:                 Appears normal         Ductal Arch:            Previously seen  Ventricles:            Appears normal         Diaphragm:              Appears normal  Choroid Plexus:        Previously seen        Stomach:                Appears normal, left                                                                        sided  Cerebellum:            Previously seen        Abdomen:                Appears normal  Posterior Fossa:       Previously seen  Abdominal Wall:         Previously seen  Nuchal Fold:           Not applicable (>20    Cord Vessels:           Previously seen                         wks GA)  Face:                  Orbits and profile     Kidneys:                Appear normal                         previously seen  Lips:                  Appears normal         Bladder:                Appears normal  Thoracic:              Appears normal         Spine:                  Previously seen  Heart:                 Appears normal         Upper Extremities:      Previously seen                         (4CH, axis, and situs  RVOT:                  Not well visualized    Lower Extremities:      Previously seen  LVOT:                  Previously seen  Other:  Female gender. Heels, Feet, and Lenses prev visualized. Technically          difficult due to maternal habitus and fetal position. ---------------------------------------------------------------------- Cervix Uterus Adnexa  Cervix  Not visualized (advanced GA  >24wks) ---------------------------------------------------------------------- Comments  U/S images reviewed. Findings reviewed with patient.  Appropriate fetal growth is noted.  No fetal abnormalities are  identified.  No evidence of fetal compromise is found on BPP  today.  Patient's chart reflects poor BS control.  General counseling regarding poorly controlled Diabetes  (DM) and the increased risks for congenital malformations for  pre-existing Diabetes - especially CNS and cardiac  malformations, fetal macrosomia  and birth trauma (early DM)  /IUGR (more advance DM), perinatal morbidity and mortality,  neonatal hypoglycemia and hyperbilirubinemia and delayed  lung maturity was performed.  The roles of serial U/S for  growth and close A-P surveillance beginning in the 3rd  trimester as well as a description of Biophysical Profile testing  were discussed as well.  QID blood glucose testing should be  performed. FBS < 95 mg % and 2 Hr PP < 120 mg% should  be maintained.  Insulin/oral hypoglycemic requiring patients  should not go beyond their due date.  Pre-existing diabetics  should undergo fetal ECHO @ 22 weeks.  Questions answered.  15 minutes spent face to face with patient.  Recommendations: 1) Weekly BPP 2) Serial U/S every 4  weeks for  fetal growth ---------------------------------------------------------------------- Recommendations   1) Weekly BPP 2) Serial U/S every 4 weeks for fetal growth ----------------------------------------------------------------------               Patsi Sears, MD Electronically Signed Final Report   06/24/2018 10:01 am ----------------------------------------------------------------------  [place] [facility] Ob Follow Up  Result Date: 06/24/2018 ----------------------------------------------------------------------  OBSTETRICS REPORT                       (Signed Final 06/24/2018 10:01 am) ---------------------------------------------------------------------- Patient Info  ID #:        [id number]                          D.O.B.:  [birth year] (32 yrs)  Name:       Christie [name]             Visit Date: 06/24/2018 09:25 am ---------------------------------------------------------------------- Performed By  Performed By:     Emeline Darling BS,      Ref. Address:     [address]                                                             [place], [place]                                                             [address]  Attending:        Patsi Sears      Location:         [facility]                    MD  Referred By:      Tereso Newcomer MD ---------------------------------------------------------------------- Orders   #  Description                          Code         Ordered By   1  [place] MFM OB FOLLOW UP                  01027.25     ROBERT JACOBSON   2  [place] [facility]FM FETAL BPP WO NON              36644.03     ROBERT JACOBSON      STRESS  ----------------------------------------------------------------------   #  Order #  Accession #                 Episode #   1  [id number]                  [id number]   2  [id number]                  [id number]                  [id number]  ---------------------------------------------------------------------- Indications   Poor obstetric history: Previous               O09.299   preeclampsia / eclampsia/gestational HTN   Asthma (Mild, Intermittent)                    O99.89 j45.909   Pre-existing diabetes, type 1, in pregnancy,   O24.012   second trimester (Novolog)   Antenatal follow-up for nonvisualized fetal    Z36.2   anatomy   [redacted] weeks gestation of pregnancy                Z3A.28   Obesity complicating pregnancy, second         O99.212   trimester (pre-pregnancy BMI 35)  ---------------------------------------------------------------------- Vital Signs  Weight (lb): 234                                Height:        5'9"  BMI:         34.55 ---------------------------------------------------------------------- Fetal Evaluation  Num Of Fetuses:         1  Fetal Heart Rate(bpm):  148  Cardiac Activity:       Observed  Presentation:           Cephalic  Placenta:               Posterior  P. Cord Insertion:      Previously Visualized  Amniotic Fluid  AFI FV:      Within normal limits  AFI Sum(cm)     %Tile       Largest Pocket(cm)  22.13           90          7.66  RUQ(cm)       RLQ(cm)       LUQ(cm)        LLQ(cm)  6.03          2.81          7.66           5.63 ---------------------------------------------------------------------- Biophysical Evaluation  Amniotic F.V:   Pocket => 2 cm two         F. Tone:        Observed                  planes  F. Movement:    Observed                   Score:          8/8  F. Breathing:   Observed ---------------------------------------------------------------------- Biometry  BPD:        69  mm     G. Age:  27w 5d         10  %  CI:        73.96   %    70 - 86                                                          FL/HC:      21.8   %    19.6 - 20.8  HC:      254.8  mm     G. Age:  27w 5d        < 3  %    HC/AC:      0.99        0.99 - 1.21  AC:      258.4  mm     G. Age:  30w 0d         77  %    FL/BPD:     80.6   %    71 - 87  FL:       55.6  mm     G. Age:  29w 2d         48  %    FL/AC:      21.5   %    20 - 24  Est. FW:    1380  gm      3 lb 1 oz     61  % ---------------------------------------------------------------------- OB History  Gravidity:    4         Term:   2  TOP:          1        Living:  2 ---------------------------------------------------------------------- Gestational Age  LMP:           28w 6d        Date:  12/04/17                 EDD:   09/10/18  U/S Today:     28w 5d                                        EDD:   09/11/18  Best:          28w 6d     Det. By:  LMP  (12/04/17)          EDD:   09/10/18  ---------------------------------------------------------------------- Anatomy  Cranium:               Appears normal         Aortic Arch:            Previously seen  Cavum:                 Appears normal         Ductal Arch:            Previously seen  Ventricles:            Appears normal         Diaphragm:              Appears normal  Choroid Plexus:        Previously seen        Stomach:  Appears normal, left                                                                        sided  Cerebellum:            Previously seen        Abdomen:                Appears normal  Posterior Fossa:       Previously seen        Abdominal Wall:         Previously seen  Nuchal Fold:           Not applicable (>20    Cord Vessels:           Previously seen                         wks GA)  Face:                  Orbits and profile     Kidneys:                Appear normal                         previously seen  Lips:                  Appears normal         Bladder:                Appears normal  Thoracic:              Appears normal         Spine:                  Previously seen  Heart:                 Appears normal         Upper Extremities:      Previously seen                         (4CH, axis, and situs  RVOT:                  Not well visualized    Lower Extremities:      Previously seen  LVOT:                  Previously seen  Other:  Female gender. Heels, Feet, and Lenses prev visualized. Technically          difficult due to maternal habitus and fetal position. ---------------------------------------------------------------------- Cervix Uterus Adnexa  Cervix  Not visualized (advanced GA >24wks) ---------------------------------------------------------------------- Comments  U/S images reviewed. Findings reviewed with patient.  Appropriate fetal growth is noted.  No fetal abnormalities are  identified.  No evidence of fetal compromise is found on BPP  today.  Patient's chart reflects poor BS control.   General counseling regarding poorly controlled Diabetes  (DM) and the increased risks for congenital malformations for  pre-existing Diabetes - especially CNS and cardiac  malformations, fetal macrosomia  and birth trauma (early DM)  /IUGR (more advance DM), perinatal morbidity and mortality,  neonatal hypoglycemia and hyperbilirubinemia and delayed  lung maturity was performed.  The roles of serial U/S for  growth and close A-P surveillance beginning in the 3rd  trimester as well as a description of Biophysical Profile testing  were discussed as well.  QID blood glucose testing should be  performed. FBS < 95 mg % and 2 Hr PP < 120 mg% should  be maintained.  Insulin/oral hypoglycemic requiring patients  should not go beyond their due date.  Pre-existing diabetics  should undergo fetal ECHO @ 22 weeks.  Questions answered.  15 minutes spent face to face with patient.  Recommendations: 1) Weekly BPP 2) Serial U/S every 4  weeks for fetal growth ---------------------------------------------------------------------- Recommendations   1) Weekly BPP 2) Serial U/S every 4 weeks for fetal growth ----------------------------------------------------------------------               Patsi Sears, MD Electronically Signed Final Report   06/24/2018 10:01 am ----------------------------------------------------------------------   Future Appointments  Date Time Provider Department Center  07/14/2018  8:00 AM WH-MFC [place] 3 WH-MFCUS MFC-US  07/15/2018  8:35 AM Allie Bossier, MD WOC-W[facility]A WOC  07/21/2018  8:00 AM WH-MFC [place] 3 WH-MFCUS MFC-US  07/28/2018  9:35 AM Reva Bores, MD [facility] WOC  08/12/2018  9:35 AM Conan Bowens, MD [facility] WOC  08/19/2018  9:35 AM Gwenevere Abbot, MD [facility] [place] WOC  08/26/2018  9:35 AM Allie Bossier, MD [facility] WOC  09/02/2018  9:35 AM Allie Bossier, MD [facility] WOC    Discharge Condition: Stable   Discharge disposition: 01-Home or Self Care  Allergies as of 07/13/2018      Reactions   Banana  Anaphylaxis   Citrus Other (See Comments)   Gums bleed   Adhesive [tape] Rash      Medication List    STOP taking these medications   buPROPion 300 MG 24 hr tablet Commonly known as:  WELLBUTRIN XL   busPIRone 7.5 MG tablet Commonly known as:  BUSPAR   Butalbital-APAP-Caffeine 50-325-40 MG capsule     TAKE these medications   ACCU-CHEK FASTCLIX LANCETS Misc 1 Units by Percutaneous route 4 (four) times daily.   acetaminophen 500 MG tablet Commonly known as:  TYLENOL Take 500 mg by mouth every 6 (six) hours as needed.   albuterol 108 (90 Base) MCG/ACT inhaler Commonly known as:  PROVENTIL HFA;VENTOLIN HFA Inhale 1-2 puffs into the lungs every 6 (six) hours as needed for wheezing or shortness of breath.   aspirin EC 81 MG tablet Take 1 tablet (81 mg total) by mouth daily. Take after 12 weeks for prevention of preeclampsia later in pregnancy   glucagon 1 MG injection Commonly known as:  GLUCAGON EMERGENCY Inject 1 mg into the muscle once as needed.   insulin aspart 100 UNIT/ML injection Commonly known as:  NOVOLOG Inject 25 Units into the skin 3 (three) times daily before meals. Patient will need an appointment for further refills.   Insulin Pen Needle 31G X 8 MM Misc As needed for insulin injections   ondansetron 4 MG disintegrating tablet Commonly known as:  ZOFRAN ODT Take 1 tablet (4 mg total) by mouth every 6 (six) hours as needed for nausea or vomiting.   ondansetron 4 MG tablet Commonly known as:  ZOFRAN Take 1 tablet (4 mg total) by mouth every 8 (eight) hours as needed for nausea or vomiting.   prenatal  multivitamin Tabs tablet Take 1 tablet by mouth daily at 12 noon.   promethazine 25 MG tablet Commonly known as:  PHENERGAN Take 1 tablet (25 mg total) by mouth every 6 (six) hours as needed for nausea or vomiting.      Follow-up Information    Endocrinologist. Schedule an appointment as soon as possible for a visit in 1 week(s).            Signed: [name] M.D. 07/13/2018, 7:08 AM

## 2018-07-14 ENCOUNTER — Other Ambulatory Visit: Payer: Self-pay | Admitting: General Practice

## 2018-07-14 ENCOUNTER — Encounter: Payer: Self-pay | Admitting: General Practice

## 2018-07-14 ENCOUNTER — Encounter (HOSPITAL_COMMUNITY): Payer: Self-pay

## 2018-07-14 ENCOUNTER — Ambulatory Visit (HOSPITAL_COMMUNITY)
Admission: RE | Admit: 2018-07-14 | Discharge: 2018-07-14 | Disposition: A | Discharge status: Home / Self Care | Payer: Medicaid Other | Source: Ambulatory Visit | Attending: Family Medicine | Admitting: Family Medicine

## 2018-07-14 DIAGNOSIS — O24019 Pre-existing diabetes mellitus, type 1, in pregnancy, unspecified trimester: Secondary | ICD-10-CM | POA: Insufficient documentation

## 2018-07-14 DIAGNOSIS — O09293 Supervision of pregnancy with other poor reproductive or obstetric history, third trimester: Secondary | ICD-10-CM | POA: Diagnosis not present

## 2018-07-14 DIAGNOSIS — Z3A31 31 weeks gestation of pregnancy: Secondary | ICD-10-CM | POA: Diagnosis not present

## 2018-07-14 DIAGNOSIS — O99213 Obesity complicating pregnancy, third trimester: Secondary | ICD-10-CM

## 2018-07-14 DIAGNOSIS — O24013 Pre-existing diabetes mellitus, type 1, in pregnancy, third trimester: Secondary | ICD-10-CM

## 2018-07-15 ENCOUNTER — Ambulatory Visit (INDEPENDENT_AMBULATORY_CARE_PROVIDER_SITE_OTHER): Payer: Medicaid Other | Admitting: Obstetrics & Gynecology

## 2018-07-15 VITALS — BP 118/76 | HR 93 | Wt 234.8 lb

## 2018-07-15 DIAGNOSIS — E1065 Type 1 diabetes mellitus with hyperglycemia: Secondary | ICD-10-CM

## 2018-07-15 DIAGNOSIS — O0993 Supervision of high risk pregnancy, unspecified, third trimester: Secondary | ICD-10-CM

## 2018-07-15 DIAGNOSIS — O99213 Obesity complicating pregnancy, third trimester: Secondary | ICD-10-CM

## 2018-07-15 DIAGNOSIS — IMO0002 Reserved for concepts with insufficient information to code with codable children: Secondary | ICD-10-CM

## 2018-07-15 DIAGNOSIS — R0602 Shortness of breath: Secondary | ICD-10-CM

## 2018-07-15 DIAGNOSIS — O99210 Obesity complicating pregnancy, unspecified trimester: Secondary | ICD-10-CM

## 2018-07-15 DIAGNOSIS — E108 Type 1 diabetes mellitus with unspecified complications: Secondary | ICD-10-CM

## 2018-07-15 DIAGNOSIS — O24013 Pre-existing diabetes mellitus, type 1, in pregnancy, third trimester: Secondary | ICD-10-CM

## 2018-07-15 LAB — POCT URINALYSIS DIP (DEVICE)
Bilirubin Urine: NEGATIVE
Glucose, UA: 500 mg/dL — AB
HGB URINE DIPSTICK: NEGATIVE
Ketones, ur: NEGATIVE mg/dL
Leukocytes, UA: NEGATIVE
Nitrite: NEGATIVE
PROTEIN: NEGATIVE mg/dL
Specific Gravity, Urine: 1.01 (ref 1.005–1.030)
Urobilinogen, UA: 0.2 mg/dL (ref 0.0–1.0)
pH: 7 (ref 5.0–8.0)

## 2018-07-15 NOTE — Addendum Note (Signed)
Addended by: Gerome Apley on: 07/15/2018 09:24 AM   Modules accepted: Orders

## 2018-07-15 NOTE — Progress Notes (Signed)
   PRENATAL VISIT NOTE  Subjective:  Christie [name] is a 33 y.o. [phone] at [redacted]w[redacted]d being seen today for ongoing prenatal care.  She is currently monitored for the following issues for this high-risk pregnancy and has Type I diabetes mellitus with complication, uncontrolled (HCC); History of migraine during pregnancy; Dysplasia of cervix, high grade CIN 2; Asthma, mild intermittent; Adjustment disorder with depressed mood; Abnormal Papanicolaou smear of cervix with positive human papilloma virus (HPV) test; Back pain; History of loop electrical excision procedure (LEEP); Preexisting diabetes complicating pregnancy, antepartum; Supervision of high-risk pregnancy; Alpha+ thalassemia trait; Migraine; DKA (diabetic ketoacidoses) (HCC); and Type 1 diabetes mellitus in pregnancy on their problem list.  Patient reports she says that she has intermittent SOB, this has been going on for a relatively long time..  Contractions: Regular. Vag. Bleeding: None.  Movement: Present. Denies leaking of fluid.   The following portions of the patient's history were reviewed and updated as appropriate: allergies, current medications, past family history, past medical history, past social history, past surgical history and problem list. Problem list updated.  Objective:   Vitals:   07/15/18 0849  BP: 118/76  Pulse: 93  Weight: 234 lb 12.8 oz (106.5 kg)    Fetal Status: Fetal Heart Rate (bpm): 149   Movement: Present     General:  Alert, oriented and cooperative. Patient is in no acute distress.  Skin: Skin is warm and dry. No rash noted.   Cardiovascular: Normal heart rate noted  Respiratory: Normal respiratory effort, no problems with respiration noted  Abdomen: Soft, gravid, appropriate for gestational age.  Pain/Pressure: Present     Pelvic: Cervical exam performed        Extremities: Normal range of motion.  Edema: None  Mental Status: Normal mood and affect. Normal behavior. Normal judgment and thought  content.   Assessment and Plan:  Pregnancy: [id number] at [redacted]w[redacted]d  1. Supervision of high risk pregnancy in third trimester   2. Type I diabetes mellitus with complication, uncontrolled (HCC) - weekly BPP at [facility] and weekly NST here - she has not checked her sugars since her discharge from the hospital due to DKA. She will see Bev today about using her lancets - normal fetal echo - on baby asa   Preterm labor symptoms and general obstetric precautions including but not limited to vaginal bleeding, contractions, leaking of fluid and fetal movement were reviewed in detail with the patient. Please refer to After Visit Summary for other counseling recommendations.  No follow-ups on file.  Future Appointments  Date Time Provider Department Center  07/21/2018  8:00 AM WH-MFC [place] 3 WH-MFCUS MFC-US  07/28/2018  9:35 AM Reva Bores, MD WOC-W[facility]A WOC  08/12/2018  9:35 AM Conan Bowens, MD [facility] WOC  08/19/2018  9:35 AM Gwenevere Abbot, MD [facility] WOC  08/26/2018  9:35 AM Allie Bossier, MD [facility]  09/02/2018  9:35 AM Allie Bossier, MD WOC-WOCA WOC    Allie Bossier, MD

## 2018-07-15 NOTE — Progress Notes (Signed)
Referral to [facility] Med Dr. Shawnie Pons per patient preference and Dr.Dove order for SOB

## 2018-07-21 ENCOUNTER — Ambulatory Visit (HOSPITAL_COMMUNITY)
Admission: RE | Admit: 2018-07-21 | Discharge: 2018-07-21 | Disposition: A | Discharge status: Home / Self Care | Payer: Medicaid Other | Source: Ambulatory Visit | Attending: Family Medicine | Admitting: Family Medicine

## 2018-07-21 ENCOUNTER — Ambulatory Visit (HOSPITAL_COMMUNITY): Payer: Medicaid Other

## 2018-07-21 ENCOUNTER — Encounter (HOSPITAL_COMMUNITY): Payer: Self-pay

## 2018-07-21 ENCOUNTER — Other Ambulatory Visit (HOSPITAL_COMMUNITY): Payer: Self-pay | Admitting: *Deleted

## 2018-07-21 ENCOUNTER — Ambulatory Visit (INDEPENDENT_AMBULATORY_CARE_PROVIDER_SITE_OTHER): Payer: Medicaid Other | Admitting: Family Medicine

## 2018-07-21 ENCOUNTER — Other Ambulatory Visit (HOSPITAL_COMMUNITY): Payer: Self-pay | Admitting: Maternal and Fetal Medicine

## 2018-07-21 VITALS — BP 100/77 | HR 106 | Wt 235.0 lb

## 2018-07-21 DIAGNOSIS — O24019 Pre-existing diabetes mellitus, type 1, in pregnancy, unspecified trimester: Secondary | ICD-10-CM | POA: Insufficient documentation

## 2018-07-21 DIAGNOSIS — O24013 Pre-existing diabetes mellitus, type 1, in pregnancy, third trimester: Secondary | ICD-10-CM

## 2018-07-21 DIAGNOSIS — Z3A32 32 weeks gestation of pregnancy: Secondary | ICD-10-CM | POA: Diagnosis not present

## 2018-07-21 DIAGNOSIS — O403XX0 Polyhydramnios, third trimester, not applicable or unspecified: Secondary | ICD-10-CM

## 2018-07-21 DIAGNOSIS — O09293 Supervision of pregnancy with other poor reproductive or obstetric history, third trimester: Secondary | ICD-10-CM | POA: Diagnosis not present

## 2018-07-21 DIAGNOSIS — Z362 Encounter for other antenatal screening follow-up: Secondary | ICD-10-CM

## 2018-07-21 DIAGNOSIS — O99213 Obesity complicating pregnancy, third trimester: Secondary | ICD-10-CM | POA: Diagnosis not present

## 2018-07-21 DIAGNOSIS — O0990 Supervision of high risk pregnancy, unspecified, unspecified trimester: Secondary | ICD-10-CM

## 2018-07-21 DIAGNOSIS — O0993 Supervision of high risk pregnancy, unspecified, third trimester: Secondary | ICD-10-CM

## 2018-07-21 LAB — POCT URINALYSIS DIP (DEVICE)
Bilirubin Urine: NEGATIVE
Glucose, UA: 500 mg/dL — AB
Hgb urine dipstick: NEGATIVE
Ketones, ur: 40 mg/dL — AB
Leukocytes, UA: NEGATIVE
Nitrite: NEGATIVE
Protein, ur: NEGATIVE mg/dL
Specific Gravity, Urine: 1.01 (ref 1.005–1.030)
Urobilinogen, UA: 0.2 mg/dL (ref 0.0–1.0)
pH: 6 (ref 5.0–8.0)

## 2018-07-21 MED ORDER — FAMOTIDINE 20 MG PO TABS: 20.0000 mg | ORAL_TABLET | Freq: Two times a day (BID) | ORAL | 3 refills | Status: DC

## 2018-07-21 NOTE — Procedures (Signed)
Christie [name] 10/24/1985 [redacted]w[redacted]d  Fetus A Non-Stress Test Interpretation for 07/21/18  Indication: Diabetes  Fetal Heart Rate A Mode: External Baseline Rate (A): 140 bpm Variability: Moderate Accelerations: 15 x 15 Decelerations: Variable(X 1) Multiple birth?: No  Uterine Activity Mode: Palpation, Toco Contraction Frequency (min): Irreg Contraction Duration (sec): 30-60 Contraction Quality: Mild Resting Tone Palpated: Relaxed Resting Time: Adequate  Interpretation (Fetal Testing) Nonstress Test Interpretation: Reactive Overall Impression: Reassuring for gestational age Comments: EFM tracing reviewed by Dr. Judeth Cornfield

## 2018-07-21 NOTE — Progress Notes (Signed)
Subjective:  Christie [name] is a 33 y.o. [phone] at [redacted]w[redacted]d being seen today for ongoing prenatal care.  She is currently monitored for the following issues for this high-risk pregnancy and has Type I diabetes mellitus with complication, uncontrolled (HCC); History of migraine during pregnancy; Dysplasia of cervix, high grade CIN 2; Asthma, mild intermittent; Adjustment disorder with depressed mood; Abnormal Papanicolaou smear of cervix with positive human papilloma virus (HPV) test; Back pain; History of loop electrical excision procedure (LEEP); Preexisting diabetes complicating pregnancy, antepartum; Supervision of high-risk pregnancy; Alpha+ thalassemia trait; Migraine; DKA (diabetic ketoacidoses) (HCC); and Type 1 diabetes mellitus in pregnancy on their problem list.  GDM: Patient taking insulin pump:  Basal: 2.3 unit/hr  Sensitivity: 1/15 mg/dl   Reports no hypoglycemic episodes.  Tolerating medication well. Didn't bring log. Fasting: 160s 2hr PP: 200s  Patient reports heartburn.  Contractions: Irregular. Vag. Bleeding: None.  Movement: Present. Denies leaking of fluid.   The following portions of the patient's history were reviewed and updated as appropriate: allergies, current medications, past family history, past medical history, past social history, past surgical history and problem list. Problem list updated.  Objective:   Vitals:   07/21/18 1401  BP: 100/77  Pulse: (!) 106  Weight: 235 lb (106.6 kg)    Fetal Status: Fetal Heart Rate (bpm): 150   Movement: Present     General:  Alert, oriented and cooperative. Patient is in no acute distress.  Skin: Skin is warm and dry. No rash noted.   Cardiovascular: Normal heart rate noted  Respiratory: Normal respiratory effort, no problems with respiration noted  Abdomen: Soft, gravid, appropriate for gestational age. Pain/Pressure: Absent     Pelvic: Vag. Bleeding: None     Cervical exam deferred        Extremities: Normal range  of motion.  Edema: None  Mental Status: Normal mood and affect. Normal behavior. Normal judgment and thought content.   Urinalysis:      Assessment and Plan:  Pregnancy: [id number] at [redacted]w[redacted]d  1. Type 1 diabetes mellitus during pregnancy, antepartum Increase basal rate to 2.8 unit per hour.  Better compliance with diet. F/u Thursday with Bev Discussed pt with Dr Judeth Cornfield prior to patient's arrival - initially recommended admission and change from pump to injectible due to patient not being followed by endocrine. Patient will reconnect with endocrine. Will manage pump here and adjust. If remains uncontrolled, will need delivery at 37.  2. Supervision of high risk pregnancy, antepartum pepcid for heartburn. FHT normal  3. Polyhydramnios Will continue to watch.  Preterm labor symptoms and general obstetric precautions including but not limited to vaginal bleeding, contractions, leaking of fluid and fetal movement were reviewed in detail with the patient. Please refer to After Visit Summary for other counseling recommendations.  No follow-ups on file.   Levie Heritage, DO

## 2018-07-24 ENCOUNTER — Encounter: Payer: Medicaid Other | Attending: Family Medicine | Admitting: *Deleted

## 2018-07-24 ENCOUNTER — Ambulatory Visit: Payer: Medicaid Other | Admitting: *Deleted

## 2018-07-24 DIAGNOSIS — Z713 Dietary counseling and surveillance: Secondary | ICD-10-CM | POA: Diagnosis not present

## 2018-07-24 DIAGNOSIS — O24013 Pre-existing diabetes mellitus, type 1, in pregnancy, third trimester: Secondary | ICD-10-CM | POA: Diagnosis not present

## 2018-07-25 NOTE — Progress Notes (Signed)
Patient was seen on 07/24/2018 for type 1 Diabetes and pregnancy self-management. EDD 09/02/2018. Patient reports her current A1c is 12% and she was admitted to hospital for DKA recently. She states her site was painful and she took her pump off for a couple of days which led to hospitalization. Patient would like to review carb counting today along with Sherilyn Cooter who came to this visit with her today.   Intervention:  Discussed Carb Counting by food group and recommended average of 3-4 carb choices per meal for adequate calories and nutrition for pregnancy  Demonstrated use of Bolus Wizard on pump so she can enter her actual food as well as blood sugar to deliver more accurate doses for meals  Reviewed causes, symptoms and treatment for DKA.   Patient informed of steps to prevent DKA in the future.  Suggested she obtain [employer] that she can keep in meter case and use to evaluate ketones pro-actively in the future  Pump settings noted as follows:  Basal rate: 2.9 units per hour x 24 hours = 70 units/day  Bolus:  Insulin to Carb Ratio increased from 1.35 top 4 units / exchange (to match her actual   meal time doses better)  Sensitivity Factor 1/15 mg/dl  Target 341-962 mg/dl  Active insulin: 4 hours  Patient instructed to monitor glucose levels: FBS: 60 - 95 mg/dl 2 hour: <229 mg/dl  Patient received the following handouts:  Nutrition Diabetes and Pregnancy  Carbohydrate Counting List  DKA handout  Patient will be seen for follow-up in 1 week to evaluate bolus amounts and as needed.

## 2018-07-28 ENCOUNTER — Encounter: Payer: Self-pay | Admitting: Family Medicine

## 2018-07-28 ENCOUNTER — Ambulatory Visit (HOSPITAL_COMMUNITY): Admission: RE | Admit: 2018-07-28 | Payer: Medicaid Other | Source: Ambulatory Visit

## 2018-07-28 ENCOUNTER — Encounter (HOSPITAL_COMMUNITY): Payer: Self-pay

## 2018-07-28 ENCOUNTER — Ambulatory Visit (INDEPENDENT_AMBULATORY_CARE_PROVIDER_SITE_OTHER): Payer: Medicaid Other | Admitting: Family Medicine

## 2018-07-28 ENCOUNTER — Ambulatory Visit (HOSPITAL_COMMUNITY)
Admission: RE | Admit: 2018-07-28 | Discharge: 2018-07-28 | Disposition: A | Discharge status: Home / Self Care | Payer: Medicaid Other | Source: Ambulatory Visit | Attending: Family Medicine | Admitting: Family Medicine

## 2018-07-28 ENCOUNTER — Other Ambulatory Visit (HOSPITAL_COMMUNITY): Payer: Self-pay | Admitting: Obstetrics and Gynecology

## 2018-07-28 VITALS — BP 132/83 | HR 111 | Wt 235.9 lb

## 2018-07-28 DIAGNOSIS — Z3A33 33 weeks gestation of pregnancy: Secondary | ICD-10-CM | POA: Diagnosis not present

## 2018-07-28 DIAGNOSIS — O0993 Supervision of high risk pregnancy, unspecified, third trimester: Secondary | ICD-10-CM

## 2018-07-28 DIAGNOSIS — O24019 Pre-existing diabetes mellitus, type 1, in pregnancy, unspecified trimester: Secondary | ICD-10-CM

## 2018-07-28 DIAGNOSIS — E1065 Type 1 diabetes mellitus with hyperglycemia: Secondary | ICD-10-CM | POA: Diagnosis not present

## 2018-07-28 DIAGNOSIS — E108 Type 1 diabetes mellitus with unspecified complications: Principal | ICD-10-CM

## 2018-07-28 DIAGNOSIS — O24013 Pre-existing diabetes mellitus, type 1, in pregnancy, third trimester: Secondary | ICD-10-CM

## 2018-07-28 DIAGNOSIS — O09293 Supervision of pregnancy with other poor reproductive or obstetric history, third trimester: Secondary | ICD-10-CM | POA: Diagnosis not present

## 2018-07-28 DIAGNOSIS — O99213 Obesity complicating pregnancy, third trimester: Secondary | ICD-10-CM

## 2018-07-28 DIAGNOSIS — O403XX0 Polyhydramnios, third trimester, not applicable or unspecified: Secondary | ICD-10-CM

## 2018-07-28 DIAGNOSIS — M62838 Other muscle spasm: Secondary | ICD-10-CM | POA: Diagnosis not present

## 2018-07-28 DIAGNOSIS — IMO0002 Reserved for concepts with insufficient information to code with codable children: Secondary | ICD-10-CM

## 2018-07-28 DIAGNOSIS — Z362 Encounter for other antenatal screening follow-up: Secondary | ICD-10-CM

## 2018-07-28 DIAGNOSIS — O0990 Supervision of high risk pregnancy, unspecified, unspecified trimester: Secondary | ICD-10-CM

## 2018-07-28 MED ORDER — CYCLOBENZAPRINE HCL 10 MG PO TABS: 10.0000 mg | ORAL_TABLET | Freq: Three times a day (TID) | ORAL | 1 refills | Status: DC | PRN

## 2018-07-28 NOTE — ED Notes (Signed)
Pt reports lower abd tightening since 3am.  Denies contractions, bleeding or leaking. +FM.

## 2018-07-28 NOTE — Patient Instructions (Signed)

## 2018-07-28 NOTE — Progress Notes (Signed)
Elevated PHQ-9 and GAD-7 scores.  Pt declines to see [facility] and reports she feels it is due to "nesting."

## 2018-07-28 NOTE — Progress Notes (Addendum)
PRENATAL VISIT NOTE  Subjective:  Christie [name] is a 33 y.o. [phone] at 1765w5d being seen today for ongoing prenatal care.  She is currently monitored for the following issues for this high-risk pregnancy and has Type I diabetes mellitus with complication, uncontrolled (HCC); History of migraine during pregnancy; Dysplasia of cervix, high grade CIN 2; Asthma, mild intermittent; Adjustment disorder with depressed mood; Abnormal Papanicolaou smear of cervix with positive human papilloma virus (HPV) test; Back pain; History of loop electrical excision procedure (LEEP); Preexisting diabetes complicating pregnancy, antepartum; Supervision of high-risk pregnancy; Alpha+ thalassemia trait; Migraine; DKA (diabetic ketoacidoses) (HCC); Type 1 diabetes mellitus in pregnancy; and Polyhydramnios in third trimester, antepartum complication on their problem list.  Patient reports abdominal pain and tenderness after running up the stairs. Stomach feels tight, cannot stand up straight..  Contractions: Irregular. Vag. Bleeding: None.  Movement: Present. Denies leaking of fluid.   The following portions of the patient's history were reviewed and updated as appropriate: allergies, current medications, past family history, past medical history, past social history, past surgical history and problem list. Problem list updated.  Objective:   Vitals:   07/28/18 1012  BP: 132/83  Pulse: (!) 111  Weight: 235 lb 14.4 oz (107 kg)    Fetal Status: Fetal Heart Rate (bpm): 136   Movement: Present     General:  Alert, oriented and cooperative. Patient is in no acute distress.  Skin: Skin is warm and dry. No rash noted.   Cardiovascular: Normal heart rate noted  Respiratory: Normal respiratory effort, no problems with respiration noted  Abdomen: Soft, gravid, appropriate for gestational age.  Pain/Pressure: Present     Pelvic: Cervical exam deferred        Extremities: Normal range of motion.  Edema: None    Mental Status: Normal mood and affect. Normal behavior. Normal judgment and thought content.   Assessment and Plan:  Pregnancy: [id number] at 4365w5d  1. Type I diabetes mellitus with complication, uncontrolled (HCC) FBS 90-175 some have  2 hr pp 134-229 many still out of range Continue pump--adjustment per Bev, cannot get with Endocrinology until post delivery (not comfortable adjusting meds during pregnancy) will likely need a tweak of basal and adjustment of correction. She is now carb counting. Continue ASA - Fetal nonstress test NST:  Baseline: 145 bpm, Variability: Good {> 6 bpm), Accelerations: Reactive and Decelerations: Absent BPP 8/8 today in MFM Last u/s for growth shows 5 lb 76%, AFI 27.22  2. Supervision of high risk pregnancy, antepartum  3. Polyhydramnios in third trimester, antepartum complication - Fetal nonstress test  5. Muscle spasm Trial of heat, maternity support band and muscle relaxer - cyclobenzaprine (FLEXERIL) 10 MG tablet; Take 1 tablet (10 mg total) by mouth every 8 (eight) hours as needed for muscle spasms.  Dispense: 30 tablet; Refill: 1  Preterm labor symptoms and general obstetric precautions including but not limited to vaginal bleeding, contractions, leaking of fluid and fetal movement were reviewed in detail with the patient. Please refer to After Visit Summary for other counseling recommendations.  Return in 1 week (on 08/04/2018) for needs MD, OB visit .  Future Appointments  Date Time Provider Department Center  07/31/2018  [facility]  08/04/2018  9:15 AM [facility] US 4 [facility]-M[facility]US M[facility]-US  08/04/2018 10:35 AM Levie HeritageStinson, Jacob J, DO [facility] WOC  08/04/2018  1:15 PM [facility] NST [facility]-MFC MFC-US  08/11/2018 10:45 AM [facility] US 2 [facility]-MFCUS MFC-US  08/11/2018  1:15 PM [facility]-MFC NST  WH-MFC MFC-US  08/12/2018  9:35 AM Conan Bowensavis, Kelly M, MD [facility] WOC  08/18/2018  8:30 AM [facility]-MFC US 1 WH-MFCUS MFC-US  08/18/2018  9:45 AM [facility]-MFC NST WH-MFC MFC-US  08/19/2018   9:35 AM Gwenevere AbbotPhillip, Nimeka, [facility]  08/26/2018  9:35 AM Allie Bossierove, Myra C, MD [facility]  09/02/2018  9:35 AM Allie Bossierove, Myra C, MD [facility]    Reva Boresanya S Laraya Pestka, MD

## 2018-07-31 ENCOUNTER — Other Ambulatory Visit: Payer: Self-pay

## 2018-08-04 ENCOUNTER — Encounter (HOSPITAL_COMMUNITY): Payer: Self-pay

## 2018-08-04 ENCOUNTER — Ambulatory Visit (HOSPITAL_COMMUNITY)
Admission: RE | Admit: 2018-08-04 | Discharge: 2018-08-04 | Disposition: A | Discharge status: Home / Self Care | Payer: Medicaid Other | Source: Ambulatory Visit | Attending: Family Medicine | Admitting: Family Medicine

## 2018-08-04 ENCOUNTER — Ambulatory Visit (INDEPENDENT_AMBULATORY_CARE_PROVIDER_SITE_OTHER): Payer: Medicaid Other | Admitting: Family Medicine

## 2018-08-04 VITALS — BP 118/76 | HR 115 | Wt 233.3 lb

## 2018-08-04 VITALS — BP 118/77 | HR 127 | Wt 234.8 lb

## 2018-08-04 DIAGNOSIS — O0990 Supervision of high risk pregnancy, unspecified, unspecified trimester: Secondary | ICD-10-CM

## 2018-08-04 DIAGNOSIS — O24019 Pre-existing diabetes mellitus, type 1, in pregnancy, unspecified trimester: Secondary | ICD-10-CM | POA: Insufficient documentation

## 2018-08-04 DIAGNOSIS — O99213 Obesity complicating pregnancy, third trimester: Secondary | ICD-10-CM | POA: Diagnosis not present

## 2018-08-04 DIAGNOSIS — Z3A34 34 weeks gestation of pregnancy: Secondary | ICD-10-CM

## 2018-08-04 DIAGNOSIS — Z9889 Other specified postprocedural states: Secondary | ICD-10-CM

## 2018-08-04 DIAGNOSIS — O24013 Pre-existing diabetes mellitus, type 1, in pregnancy, third trimester: Secondary | ICD-10-CM | POA: Insufficient documentation

## 2018-08-04 DIAGNOSIS — O09293 Supervision of pregnancy with other poor reproductive or obstetric history, third trimester: Secondary | ICD-10-CM

## 2018-08-04 DIAGNOSIS — O0993 Supervision of high risk pregnancy, unspecified, third trimester: Secondary | ICD-10-CM

## 2018-08-04 DIAGNOSIS — O403XX0 Polyhydramnios, third trimester, not applicable or unspecified: Secondary | ICD-10-CM

## 2018-08-04 LAB — POCT URINALYSIS DIP (DEVICE)
Bilirubin Urine: NEGATIVE
Glucose, UA: 500 mg/dL — AB
Hgb urine dipstick: NEGATIVE
Ketones, ur: 15 mg/dL — AB
Leukocytes, UA: NEGATIVE
NITRITE: NEGATIVE
Protein, ur: NEGATIVE mg/dL
Specific Gravity, Urine: 1.01 (ref 1.005–1.030)
UROBILINOGEN UA: 0.2 mg/dL (ref 0.0–1.0)
pH: 6 (ref 5.0–8.0)

## 2018-08-04 NOTE — Progress Notes (Signed)
Subjective:  Christie [name] is a 33 y.o. [phone] at [redacted]w[redacted]d being seen today for ongoing prenatal care.  She is currently monitored for the following issues for this high-risk pregnancy and has Type I diabetes mellitus with complication, uncontrolled (HCC); History of migraine during pregnancy; Dysplasia of cervix, high grade CIN 2; Asthma, mild intermittent; Adjustment disorder with depressed mood; Abnormal Papanicolaou smear of cervix with positive human papilloma virus (HPV) test; Back pain; History of loop electrical excision procedure (LEEP); Preexisting diabetes complicating pregnancy, antepartum; Supervision of high-risk pregnancy; Alpha+ thalassemia trait; Migraine; DKA (diabetic ketoacidoses) (HCC); Type 1 diabetes mellitus in pregnancy; and Polyhydramnios in third trimester, antepartum complication on their problem list.  GDM: Patient on insulin pump.   Basal rate: 2.9 units per hour x 24 hours = 70 units/day  Bolus:             Insulin to Carb Ratio increased from 1.35 top 4 units / exchange              Sensitivity Factor 1/15 mg/dl             Target 622-297 mg/dl             Active insulin: 4 hours Reports no hypoglycemic episodes.  Tolerating medication well Fasting: 90-169 2hr PP: 132-209  Patient reports no complaints.  Contractions: Irregular. Vag. Bleeding: None.  Movement: Present. Denies leaking of fluid.   The following portions of the patient's history were reviewed and updated as appropriate: allergies, current medications, past family history, past medical history, past social history, past surgical history and problem list. Problem list updated.  Objective:   Vitals:   08/04/18 1049  BP: 118/76  Pulse: (!) 115  Weight: 233 lb 4.8 oz (105.8 kg)    Fetal Status: Fetal Heart Rate (bpm): 170   Movement: Present     General:  Alert, oriented and cooperative. Patient is in no acute distress.  Skin: Skin is warm and dry. No rash noted.   Cardiovascular: Normal  heart rate noted  Respiratory: Normal respiratory effort, no problems with respiration noted  Abdomen: Soft, gravid, appropriate for gestational age. Pain/Pressure: Present     Pelvic: Vag. Bleeding: None     Cervical exam deferred        Extremities: Normal range of motion.  Edema: Trace  Mental Status: Normal mood and affect. Normal behavior. Normal judgment and thought content.   Urinalysis:      Assessment and Plan:  Pregnancy: [id number] at [redacted]w[redacted]d  1. Supervision of high risk pregnancy, antepartum FHT normal  2. Type 1 diabetes mellitus during pregnancy, antepartum Increase Basal rate to 3.2 units per hour. F/u with Bev BPP today  3. History of loop electrical excision procedure (LEEP) Last PAP (09/2017) normal  4. Polyhydramnios in third trimester, antepartum complication BPP today.   Preterm labor symptoms and general obstetric precautions including but not limited to vaginal bleeding, contractions, leaking of fluid and fetal movement were reviewed in detail with the patient. Please refer to After Visit Summary for other counseling recommendations.  Return in about 1 week (around 08/11/2018).   Levie Heritage, DO

## 2018-08-04 NOTE — Procedures (Signed)
Christie [name] 1985/08/20 [redacted]w[redacted]d  Fetus A Non-Stress Test Interpretation for 08/04/18  Indication: Diabetes  Fetal Heart Rate A Mode: External Baseline Rate (A): 150 bpm Variability: Moderate Accelerations: 10 x 10 Decelerations: Early Multiple birth?: No  Uterine Activity Mode: Toco Contraction Frequency (min): Irreg UC noted Contraction Duration (sec): 60-150 Contraction Quality: Mild Resting Tone Palpated: Relaxed Resting Time: Adequate  Interpretation (Fetal Testing) Nonstress Test Interpretation: Non-reactive Comments: FHR tracing rev'd by Dr. Judeth Cornfield

## 2018-08-05 ENCOUNTER — Ambulatory Visit: Payer: Medicaid Other | Admitting: *Deleted

## 2018-08-05 ENCOUNTER — Encounter: Payer: Medicaid Other | Admitting: *Deleted

## 2018-08-05 ENCOUNTER — Encounter: Payer: Self-pay | Admitting: Internal Medicine

## 2018-08-05 DIAGNOSIS — Z713 Dietary counseling and surveillance: Secondary | ICD-10-CM | POA: Diagnosis not present

## 2018-08-05 NOTE — Progress Notes (Signed)
Patient was seen on 08/05/2018 for type 1 Diabetes and pregnancy self-management follow up visit. EDD 09/02/2018. She is here with her mother today, who they state has Type 2 Diabetes. She participated actively in the visit today and had questions about carb counting in particular.  She saw Dr. Candelaria Celeste yesterday and he raised her Medtronic insulin pump basal setting by 10% due to elevated fasting blood sugars from 2.9 to 3.2 units/hour Insulin to Carb ratio is still at 4 units/Carb Choice (4 units / 15 grams) Sensitivity Factor is still at 1/15 mg/dl Target is still 202-542 mg/dl  Patient states she took her pump off again last night due to being in a lot of pain and the pump being in her way. She put it back on this AM. She has not checked her BG today and did not bring strips with her meter either.  I suggested 3 solutions:  Wear a t-shirt inside out so pocket is on the inside and drop pump in the pocket  Use a baby sock to drop pump into and safety pin it to sleeping clothes  Use a soft belt from a bath robe and tie around top of her waist to clip pump to.  She states her meter does not send her BG to her pump anymore. I checked the settings and re-set up the communication settings on both the meter and the pump. We were not able to test it here since she did not bring any strips, but she will try it at home and let me know.  I also asked her to test 2 hours after meals to see if the Insulin to Carb ratio is working for her. She plans to set her phone for a 2 hour reminder to improve her success in doing this.  She states she is more comfortable with Carb Counting [employer] and feels her BG are much better now that she is giving a more specific amount of insulin for her meals. Plan to continue.   Patient reports her current A1c is 12% and she was admitted to hospital for DKA recently. She states her site was painful and she took her pump off for a couple of days which led to  hospitalization. Patient would like to review carb counting today along with Sherilyn Cooter who came to this visit with her today.   Intervention:  Attach pump to sleep clothes so it will be more comfortable when in bed and you can keep it on when sleeping  Pump settings noted as follows:  Basal rate: 3.2 units per hour x 24 hours = 76 units/day  Bolus:  Insulin to Carb Ratio:  4 units / exchange (to match her actual meal time doses better)  Sensitivity Factor 1/15 mg/dl  Target 706-237 mg/dl  Active insulin: 4 hours  Test BG 2 hours after meals as able to assess if Insulin to Carb Ratio is correct and let me know the results.  Patient instructed to monitor glucose levels: FBS: 60 - 95 mg/dl 2 hour: <628 mg/dl  Patient received the following handouts:  No new handouts today  Patient will be seen for follow-up in 1 week to evaluate bolus amounts and as needed. I will also need to assist in potential pump setting changes for labor and delivery

## 2018-08-07 ENCOUNTER — Other Ambulatory Visit: Payer: Self-pay

## 2018-08-11 ENCOUNTER — Encounter (HOSPITAL_COMMUNITY): Payer: Self-pay

## 2018-08-11 ENCOUNTER — Ambulatory Visit (HOSPITAL_COMMUNITY)
Admission: RE | Admit: 2018-08-11 | Discharge: 2018-08-11 | Disposition: A | Discharge status: Home / Self Care | Payer: Medicaid Other | Source: Ambulatory Visit | Attending: Family Medicine | Admitting: Family Medicine

## 2018-08-11 ENCOUNTER — Other Ambulatory Visit (HOSPITAL_COMMUNITY): Payer: Self-pay | Admitting: *Deleted

## 2018-08-11 DIAGNOSIS — J45909 Unspecified asthma, uncomplicated: Secondary | ICD-10-CM

## 2018-08-11 DIAGNOSIS — O24019 Pre-existing diabetes mellitus, type 1, in pregnancy, unspecified trimester: Secondary | ICD-10-CM

## 2018-08-11 DIAGNOSIS — O403XX0 Polyhydramnios, third trimester, not applicable or unspecified: Secondary | ICD-10-CM | POA: Diagnosis present

## 2018-08-11 DIAGNOSIS — Z3A35 35 weeks gestation of pregnancy: Secondary | ICD-10-CM

## 2018-08-11 DIAGNOSIS — O9989 Other specified diseases and conditions complicating pregnancy, childbirth and the puerperium: Secondary | ICD-10-CM

## 2018-08-11 DIAGNOSIS — O09293 Supervision of pregnancy with other poor reproductive or obstetric history, third trimester: Secondary | ICD-10-CM

## 2018-08-11 DIAGNOSIS — O24013 Pre-existing diabetes mellitus, type 1, in pregnancy, third trimester: Secondary | ICD-10-CM

## 2018-08-11 DIAGNOSIS — O99213 Obesity complicating pregnancy, third trimester: Secondary | ICD-10-CM

## 2018-08-11 NOTE — Procedures (Signed)
Christie [name] 1985/09/07 [redacted]w[redacted]d  Fetus A Non-Stress Test Interpretation for 08/11/18  Indication: Diabetes  Fetal Heart Rate A Mode: External Baseline Rate (A): 155 bpm Variability: Moderate Accelerations: 15 x 15 Decelerations: None Multiple birth?: No  Uterine Activity Mode: Toco Contraction Frequency (min): one UC note Contraction Duration (sec): 60 Contraction Quality: Mild Resting Tone Palpated: Relaxed Resting Time: Adequate  Interpretation (Fetal Testing) Nonstress Test Interpretation: Reactive Comments: FHR tracing rev'd by Dr. Judeth Cornfield

## 2018-08-12 ENCOUNTER — Ambulatory Visit: Payer: Medicaid Other | Admitting: *Deleted

## 2018-08-12 ENCOUNTER — Ambulatory Visit (INDEPENDENT_AMBULATORY_CARE_PROVIDER_SITE_OTHER): Payer: Medicaid Other | Admitting: Obstetrics and Gynecology

## 2018-08-12 ENCOUNTER — Other Ambulatory Visit (HOSPITAL_COMMUNITY)
Admission: RE | Admit: 2018-08-12 | Discharge: 2018-08-12 | Disposition: A | Discharge status: Home / Self Care | Payer: Medicaid Other | Source: Ambulatory Visit | Attending: Obstetrics and Gynecology | Admitting: Obstetrics and Gynecology

## 2018-08-12 ENCOUNTER — Other Ambulatory Visit: Payer: Self-pay

## 2018-08-12 ENCOUNTER — Inpatient Hospital Stay (HOSPITAL_COMMUNITY)
Admission: AD | Admit: 2018-08-12 | Discharge: 2018-08-24 | DRG: 783 | Disposition: A | Discharge status: Home / Self Care | Payer: Medicaid Other | Attending: Obstetrics and Gynecology | Admitting: Obstetrics and Gynecology

## 2018-08-12 ENCOUNTER — Encounter: Payer: Medicaid Other | Attending: Family Medicine | Admitting: *Deleted

## 2018-08-12 ENCOUNTER — Telehealth (HOSPITAL_COMMUNITY): Payer: Self-pay | Admitting: *Deleted

## 2018-08-12 ENCOUNTER — Telehealth: Payer: Self-pay | Admitting: Emergency Medicine

## 2018-08-12 ENCOUNTER — Encounter: Payer: Self-pay | Admitting: Obstetrics and Gynecology

## 2018-08-12 VITALS — BP 111/78 | HR 118 | Wt 239.6 lb

## 2018-08-12 DIAGNOSIS — O24013 Pre-existing diabetes mellitus, type 1, in pregnancy, third trimester: Secondary | ICD-10-CM | POA: Insufficient documentation

## 2018-08-12 DIAGNOSIS — E1165 Type 2 diabetes mellitus with hyperglycemia: Secondary | ICD-10-CM | POA: Diagnosis present

## 2018-08-12 DIAGNOSIS — O24319 Unspecified pre-existing diabetes mellitus in pregnancy, unspecified trimester: Secondary | ICD-10-CM | POA: Diagnosis present

## 2018-08-12 DIAGNOSIS — O24313 Unspecified pre-existing diabetes mellitus in pregnancy, third trimester: Secondary | ICD-10-CM

## 2018-08-12 DIAGNOSIS — O1205 Gestational edema, complicating the puerperium: Secondary | ICD-10-CM | POA: Diagnosis not present

## 2018-08-12 DIAGNOSIS — O2402 Pre-existing diabetes mellitus, type 1, in childbirth: Principal | ICD-10-CM | POA: Diagnosis present

## 2018-08-12 DIAGNOSIS — IMO0002 Reserved for concepts with insufficient information to code with codable children: Secondary | ICD-10-CM | POA: Diagnosis present

## 2018-08-12 DIAGNOSIS — Z794 Long term (current) use of insulin: Secondary | ICD-10-CM

## 2018-08-12 DIAGNOSIS — J4520 Mild intermittent asthma, uncomplicated: Secondary | ICD-10-CM | POA: Diagnosis present

## 2018-08-12 DIAGNOSIS — R600 Localized edema: Secondary | ICD-10-CM

## 2018-08-12 DIAGNOSIS — D563 Thalassemia minor: Secondary | ICD-10-CM | POA: Diagnosis present

## 2018-08-12 DIAGNOSIS — O0993 Supervision of high risk pregnancy, unspecified, third trimester: Secondary | ICD-10-CM

## 2018-08-12 DIAGNOSIS — O99824 Streptococcus B carrier state complicating childbirth: Secondary | ICD-10-CM | POA: Diagnosis present

## 2018-08-12 DIAGNOSIS — O403XX0 Polyhydramnios, third trimester, not applicable or unspecified: Secondary | ICD-10-CM | POA: Diagnosis present

## 2018-08-12 DIAGNOSIS — E108 Type 1 diabetes mellitus with unspecified complications: Secondary | ICD-10-CM

## 2018-08-12 DIAGNOSIS — D560 Alpha thalassemia: Secondary | ICD-10-CM | POA: Diagnosis present

## 2018-08-12 DIAGNOSIS — F419 Anxiety disorder, unspecified: Secondary | ICD-10-CM

## 2018-08-12 DIAGNOSIS — O24913 Unspecified diabetes mellitus in pregnancy, third trimester: Secondary | ICD-10-CM

## 2018-08-12 DIAGNOSIS — Z302 Encounter for sterilization: Secondary | ICD-10-CM

## 2018-08-12 DIAGNOSIS — K5900 Constipation, unspecified: Secondary | ICD-10-CM | POA: Diagnosis present

## 2018-08-12 DIAGNOSIS — R739 Hyperglycemia, unspecified: Secondary | ICD-10-CM | POA: Diagnosis present

## 2018-08-12 DIAGNOSIS — F329 Major depressive disorder, single episode, unspecified: Secondary | ICD-10-CM | POA: Diagnosis present

## 2018-08-12 DIAGNOSIS — O9952 Diseases of the respiratory system complicating childbirth: Secondary | ICD-10-CM | POA: Diagnosis present

## 2018-08-12 DIAGNOSIS — E1065 Type 1 diabetes mellitus with hyperglycemia: Secondary | ICD-10-CM

## 2018-08-12 DIAGNOSIS — Z713 Dietary counseling and surveillance: Secondary | ICD-10-CM | POA: Insufficient documentation

## 2018-08-12 DIAGNOSIS — Z8759 Personal history of other complications of pregnancy, childbirth and the puerperium: Secondary | ICD-10-CM

## 2018-08-12 DIAGNOSIS — O411230 Chorioamnionitis, third trimester, not applicable or unspecified: Secondary | ICD-10-CM | POA: Diagnosis present

## 2018-08-12 DIAGNOSIS — Z8669 Personal history of other diseases of the nervous system and sense organs: Secondary | ICD-10-CM

## 2018-08-12 DIAGNOSIS — O99344 Other mental disorders complicating childbirth: Secondary | ICD-10-CM | POA: Diagnosis present

## 2018-08-12 DIAGNOSIS — Z9641 Presence of insulin pump (external) (internal): Secondary | ICD-10-CM | POA: Diagnosis present

## 2018-08-12 DIAGNOSIS — Z3A35 35 weeks gestation of pregnancy: Secondary | ICD-10-CM

## 2018-08-12 LAB — CBC
HCT: 38.5 % (ref 36.0–46.0)
HEMOGLOBIN: 12.6 g/dL (ref 12.0–15.0)
MCH: 25 pg — ABNORMAL LOW (ref 26.0–34.0)
MCHC: 32.7 g/dL (ref 30.0–36.0)
MCV: 76.5 fL — ABNORMAL LOW (ref 80.0–100.0)
Platelets: 190 10*3/uL (ref 150–400)
RBC: 5.03 MIL/uL (ref 3.87–5.11)
RDW: 14.5 % (ref 11.5–15.5)
WBC: 5.5 10*3/uL (ref 4.0–10.5)
nRBC: 0 % (ref 0.0–0.2)

## 2018-08-12 LAB — POCT URINALYSIS DIP (DEVICE)
Bilirubin Urine: NEGATIVE
Glucose, UA: 500 mg/dL — AB
Hgb urine dipstick: NEGATIVE
Ketones, ur: NEGATIVE mg/dL
Leukocytes, UA: NEGATIVE
Nitrite: NEGATIVE
Protein, ur: NEGATIVE mg/dL
Specific Gravity, Urine: 1.01 (ref 1.005–1.030)
Urobilinogen, UA: 0.2 mg/dL (ref 0.0–1.0)
pH: 6 (ref 5.0–8.0)

## 2018-08-12 LAB — TYPE AND SCREEN
ABO/RH(D): A POS
Antibody Screen: NEGATIVE

## 2018-08-12 LAB — OB RESULTS CONSOLE GBS: GBS: POSITIVE

## 2018-08-12 LAB — OB RESULTS CONSOLE GC/CHLAMYDIA: Gonorrhea: NEGATIVE

## 2018-08-12 MED ORDER — PRENATAL MULTIVITAMIN CH: 1.0000 | ORAL_TABLET | Freq: Every day | ORAL | Status: DC

## 2018-08-12 MED ORDER — PRENATAL MULTIVITAMIN CH
1.0000 | ORAL_TABLET | Freq: Every day | ORAL | Status: DC
  Administered 2018-08-13 – 2018-08-19 (×7): 1 via ORAL
  Filled 2018-08-12 (×7): qty 1

## 2018-08-12 MED ORDER — INSULIN DETEMIR 100 UNIT/ML ~~LOC~~ SOLN
40.0000 [IU] | Freq: Two times a day (BID) | SUBCUTANEOUS | Status: DC
  Administered 2018-08-12 – 2018-08-13 (×2): 40 [IU] via SUBCUTANEOUS
  Filled 2018-08-12 (×3): qty 0.4

## 2018-08-12 MED ORDER — INSULIN ASPART 100 UNIT/ML ~~LOC~~ SOLN
15.0000 [IU] | Freq: Three times a day (TID) | SUBCUTANEOUS | Status: DC
  Administered 2018-08-13 – 2018-08-14 (×4): 15 [IU] via SUBCUTANEOUS

## 2018-08-12 MED ORDER — ASPIRIN EC 81 MG PO TBEC
81.0000 mg | DELAYED_RELEASE_TABLET | Freq: Every day | ORAL | Status: DC
  Administered 2018-08-12 – 2018-08-19 (×8): 81 mg via ORAL
  Filled 2018-08-12 (×9): qty 1

## 2018-08-12 MED ORDER — INSULIN ASPART 100 UNIT/ML ~~LOC~~ SOLN
0.0000 [IU] | Freq: Three times a day (TID) | SUBCUTANEOUS | Status: DC
  Administered 2018-08-13: 9 [IU] via SUBCUTANEOUS

## 2018-08-12 MED ORDER — DOCUSATE SODIUM 100 MG PO CAPS
100.0000 mg | ORAL_CAPSULE | Freq: Every day | ORAL | Status: DC
  Administered 2018-08-13 – 2018-08-15 (×3): 100 mg via ORAL
  Filled 2018-08-12 (×3): qty 1

## 2018-08-12 MED ORDER — INSULIN ASPART 100 UNIT/ML ~~LOC~~ SOLN
10.0000 [IU] | Freq: Once | SUBCUTANEOUS | Status: AC
  Administered 2018-08-12: 10 [IU] via SUBCUTANEOUS

## 2018-08-12 MED ORDER — CYCLOBENZAPRINE HCL 10 MG PO TABS
10.0000 mg | ORAL_TABLET | Freq: Three times a day (TID) | ORAL | Status: DC | PRN
  Administered 2018-08-16: 10 mg via ORAL
  Filled 2018-08-12 (×2): qty 1

## 2018-08-12 MED ORDER — ALBUTEROL SULFATE (2.5 MG/3ML) 0.083% IN NEBU: 3.0000 mL | INHALATION_SOLUTION | Freq: Four times a day (QID) | RESPIRATORY_TRACT | Status: DC | PRN

## 2018-08-12 MED ORDER — FAMOTIDINE 20 MG PO TABS
20.0000 mg | ORAL_TABLET | Freq: Two times a day (BID) | ORAL | Status: DC
  Administered 2018-08-12 – 2018-08-19 (×15): 20 mg via ORAL
  Filled 2018-08-12 (×15): qty 1

## 2018-08-12 MED ORDER — CALCIUM CARBONATE ANTACID 500 MG PO CHEW: 2.0000 | CHEWABLE_TABLET | ORAL | Status: DC | PRN

## 2018-08-12 MED ORDER — ZOLPIDEM TARTRATE 5 MG PO TABS
5.0000 mg | ORAL_TABLET | Freq: Every evening | ORAL | Status: DC | PRN
  Administered 2018-08-13: 5 mg via ORAL
  Filled 2018-08-12: qty 1

## 2018-08-12 MED ORDER — ACETAMINOPHEN 325 MG PO TABS: 650.0000 mg | ORAL_TABLET | ORAL | Status: DC | PRN

## 2018-08-12 NOTE — H&P (Signed)
Faculty Practice Antenatal History and Physical  Christie GravesKinyetta D Ligas NWG:[id number] DOB: 09-23-1985 DOA: 08/12/2018  Chief Complaint: Hyperglycemia  HPI: Christie [name] is a 33 y.o. female [phone] with IUP at 8183w6d admitted for blood sugar control. She is a type 1 diabetic on an insulin pump. She was seen in the office today and her blood sugars were 160 fasting and 200s postprandially. She was admitted on recommendation of MFM.   Review of Systems:   Pt denies any fevers, chills, nausea, vomiting, SOB, cp.  Review of systems are otherwise negative  Prenatal History/Complications:   Past Medical History: Past Medical History:  Diagnosis Date  . Abnormal Pap smear    colpo scheduled  . Alpha thalassemia trait   . Anemia   . Anxiety   . Asthma   . Bipolar 1 disorder (HCC)   . Chlamydia   . Depression   . Diabetes mellitus    type 1 on insulin  . Fx ankle    history of left ankle fx at age 33  . Headache(784.0)   . Hx of migraines   . Pregnancy induced hypertension   . Urinary tract infection     Past Surgical History: Past Surgical History:  Procedure Laterality Date  . COLPOSCOPY    . 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 WISDOM TOOTH EXTRACTION      Obstetrical History: OB History    Gravida  4   Para  2   Term  2   Preterm  0   AB  1   Living  2     SAB  0   TAB  1   Ectopic  0   Multiple  0   Live Births  2           Gynecological History: OB History    Gravida  4   Para  2   Term  2   Preterm  0   AB  1   Living  2     SAB  0   TAB  1   Ectopic  0   Multiple  0   Live Births  2           Social History: Social History   Socioeconomic History  . Marital status: Single    Spouse name: Not on file  . Number of children: Not on file  . Years of education: Not on file  . Highest education level: Not on file  Occupational History  . Not on file  Social Needs  . Financial resource strain: Not on file   . Food insecurity:    Worry: Never true    Inability: Never true  . Transportation needs:    Medical: No    Non-medical: No  Tobacco Use  . Smoking status: Never Smoker  . Smokeless tobacco: Never Used  Substance and Sexual Activity  . Alcohol use: Not Currently    Comment: social drinker  . Drug use: No    Comment: last used 1 years ago  . Sexual activity: Not Currently    Birth control/protection: None  Lifestyle  . Physical activity:    Days per week: Not on file    Minutes per session: Not on file  . Stress: Not on file  Relationships  . Social connections:    Talks on phone: Not on file    Gets together: Not on file    Attends religious service: Not on file  Active member of club or organization: Not on file    Attends meetings of clubs or organizations: Not on file    Relationship status: Not on file  Other Topics Concern  . Not on file  Social History Narrative  . Not on file    Family History: Family History  Problem Relation Age of Onset  . Asthma Mother   . Diabetes Mother   . Hypertension Mother   . Asthma Father   . Hypertension Sister   . Diabetes Maternal Grandmother   . Cancer Maternal Grandmother        breast  . Obesity Other   . Sleep apnea Other   . Anesthesia problems Neg Hx     Allergies: Allergies  Allergen Reactions  . Banana Anaphylaxis  . Citrus Other (See Comments)    Gums bleed  . Adhesive [Tape] Rash    Medications Prior to Admission  Medication Sig Dispense Refill Last Dose  . ACCU-CHEK FASTCLIX LANCETS MISC 1 Units by Percutaneous route 4 (four) times daily. 100 each 12 Taking  . acetaminophen (TYLENOL) 500 MG tablet Take 500 mg by mouth every 6 (six) hours as needed.   Taking  . albuterol (PROVENTIL HFA;VENTOLIN HFA) 108 (90 Base) MCG/ACT inhaler Inhale 1-2 puffs into the lungs every 6 (six) hours as needed for wheezing or shortness of breath. 1 Inhaler 0 Taking  . Alum & Mag Hydroxide-Simeth (MYLANTA PO) Take by  mouth.   Not Taking  . aspirin EC 81 MG tablet Take 1 tablet (81 mg total) by mouth daily. Take after 12 weeks for prevention of preeclampsia later in pregnancy 300 tablet 2 Taking  . busPIRone HCl (BUSPAR PO) Take by mouth.   Taking  . cyclobenzaprine (FLEXERIL) 10 MG tablet Take 1 tablet (10 mg total) by mouth every 8 (eight) hours as needed for muscle spasms. 30 tablet 1 Taking  . famotidine (PEPCID) 20 MG tablet Take 1 tablet (20 mg total) by mouth 2 (two) times daily. 60 tablet 3 Taking  . glucagon (GLUCAGON EMERGENCY) 1 MG injection Inject 1 mg into the muscle once as needed. (Patient not taking: Reported on 08/04/2018) 1 each 12 Not Taking  . insulin aspart (NOVOLOG) 100 UNIT/ML injection Inject 25 Units into the skin 3 (three) times daily before meals. Patient will need an appointment for further refills. 10 mL 1 Taking  . Insulin Pen Needle 31G X 8 MM MISC As needed for insulin injections 100 each 1 Taking  . ondansetron (ZOFRAN ODT) 4 MG disintegrating tablet Take 1 tablet (4 mg total) by mouth every 6 (six) hours as needed for nausea or vomiting. (Patient not taking: Reported on 08/12/2018) 30 tablet 2 Not Taking  . ondansetron (ZOFRAN) 4 MG tablet Take 1 tablet (4 mg total) by mouth every 8 (eight) hours as needed for nausea or vomiting. 30 tablet 3 Taking  . Prenatal Vit-Fe Fumarate-FA (PRENATAL MULTIVITAMIN) TABS tablet Take 1 tablet by mouth daily at 12 noon.   Taking  . promethazine (PHENERGAN) 25 MG tablet Take 1 tablet (25 mg total) by mouth every 6 (six) hours as needed for nausea or vomiting. (Patient not taking: Reported on 08/11/2018) 30 tablet 2 Not Taking    Physical Exam: BP 118/85 (BP Location: Right Arm)   Pulse (!) 108   Temp 98.3 F (36.8 C) (Oral)   Resp 18   LMP 12/04/2017 (Exact Date)   SpO2 100%   General appearance: alert, cooperative and no distress Lungs: clear to  auscultation bilaterally Heart: regular rate and rhythm, S1, S2 normal, no murmur, click, rub or  gallop Abdomen: soft, non-tender; bowel sounds normal; no masses,  no organomegaly and fundal height > dates Pelvic: deferred Extremities: extremities normal, atraumatic, no cyanosis or edema, Homans sign is negative, no sign of DVT and no edema, redness or tenderness in the calves or thighs Pulses: 2+ and symmetric Skin: Skin color, texture, turgor normal. No rashes or lesions Lymph nodes: Cervical, supraclavicular, and axillary nodes normal. Neurologic: Alert and oriented X 3, normal strength and tone. Normal symmetric reflexes. Normal coordination and gait cephalic  None             Labs on Admission:  Basic Metabolic Panel: No results for input(s): NA, K, CL, CO2, GLUCOSE, BUN, CREATININE, CALCIUM, MG, PHOS in the last 168 hours. Liver Function Tests: No results for input(s): AST, ALT, ALKPHOS, BILITOT, PROT, ALBUMIN in the last 168 hours. No results for input(s): LIPASE, AMYLASE in the last 168 hours. No results for input(s): AMMONIA in the last 168 hours. CBC: No results for input(s): WBC, NEUTROABS, HGB, HCT, MCV, PLT in the last 168 hours.  CBG: No results for input(s): GLUCAP in the last 168 hours.  Radiological Exams on Admission: [place] Mfm Fetal Bpp W/nonstress  Result Date: 08/11/2018 ----------------------------------------------------------------------  OBSTETRICS REPORT                    (Corrected Final 08/11/2018 03:26 pm) ---------------------------------------------------------------------- Patient Info  ID #:       [id number]                          D.O.B.:  1985/07/15 (32 yrs)  Name:       Christie Graves             Visit Date: 08/11/2018 11:52 am ---------------------------------------------------------------------- Performed By  Performed By:     Emeline Darling BS,      Ref. Address:     [address]                                                             [place],  [place]                                                             [address]  Attending:        Noralee Space MD        Location:         [facility]  Referred By:      Jethro Bastos  Macon Large MD ---------------------------------------------------------------------- Orders   #  Description                          Code         Ordered By   1  [place] MFM FETAL BPP                     40981.1      RAVI [facility]      W/NONSTRESS  ----------------------------------------------------------------------   #  Order #                    Accession #                 Episode #   1  [id number]                  [id number]                  [id number]  ---------------------------------------------------------------------- Indications   Pre-existing diabetes, type 1, in pregnancy,   O24.013   third trimester (Novolog)   Poor obstetric history: Previous               O09.299   preeclampsia / eclampsia/gestational HTN   Asthma (Mild, Intermittent)                    O99.89 j45.909   Obesity complicating pregnancy, third          O99.213   trimester   [redacted] weeks gestation of pregnancy                Z3A.35  ---------------------------------------------------------------------- Vital Signs  Weight (lb): 241                               Height:        5'9"  BMI:         35.59 ---------------------------------------------------------------------- Fetal Evaluation  Num Of Fetuses:         1  Fetal Heart Rate(bpm):  168  Cardiac Activity:       Observed  Presentation:           Cephalic  Amniotic Fluid  AFI FV:      Within normal limits  AFI Sum(cm)     %Tile       Largest Pocket(cm)  21.66           82          5.94  RUQ(cm)       RLQ(cm)       LUQ(cm)        LLQ(cm)  5.94          5.08          5.53           5.11 ---------------------------------------------------------------------- Biophysical Evaluation  Amniotic F.V:   Pocket => 2 cm two         F. Tone:        Observed                  planes  F. Movement:    Observed                    N.S.T:          Reactive  F. Breathing:   Observed  Score:          10/10 ---------------------------------------------------------------------- OB History  Gravidity:    4         Term:   2  TOP:          1        Living:  2 ---------------------------------------------------------------------- Gestational Age  LMP:           35w 5d        Date:  12/04/17                 EDD:   09/10/18  Best:          35w 5d     Det. By:  LMP  (12/04/17)          EDD:   09/10/18 ---------------------------------------------------------------------- Impression  Patient returned for antenatal testing. She is on insulin pump  that is being managed by her obstetricians. Patient reports  her basal levels were adjusted. She also reports she still has  increased glucose values.  Amniotic fluid is normal and good fetal activity is seen.  Antenatal testing is reassuring. NST is reactive. BPP 10/10.  I did review her blood glucose log today. She has an  appointment with her Ob tomorrow.  Patient had DKA in this pregnancy. I informed her that  reassuring antenatal testing does not always mean that the  fetus is not at risk of stillbirth.  If blood glucose control is poor, delivery is recommended at  37 weeks. ACOG recommends  a broad range for delivery  between 36 0/7 and 38 6/7 weeks (ACOG Committee  Opinion, No. 764, Feb 2019).  In her case, if control is poor, inpatient management should  be discussed. If patient is unwilling to be admitted, given her  history of DKA and current poor control, delivery may be  considered at 36 weeks. Newborns have higher risk of  respiratory distress syndrome and NICU admissions at 36  weeks.  We have increased the frequency of antenatal testing and the  patient will be returning in 3 days for BPP and NST  (08/14/2018).  If a decision is made to induce labor at 36 or 37 weeks,  glucose control (if poor) should be achieved with intravenous  infusion and glucose stabilizer before starting  elective  induction. ----------------------------------------------------------------------                       Noralee Spaceavi Shankar, MD Electronically Signed Corrected Final Report  08/11/2018 03:26 pm ----------------------------------------------------------------------    Assessment/Plan Active Problems:   Type I diabetes mellitus with complication, uncontrolled (HCC)   Alpha+ thalassemia trait   Polyhydramnios in third trimester, antepartum complication   Hyperglycemia  1. Hyperglycemia 2. Type 1 diabetes 3. Polyhydramnios  Admit Change to subcut insulin Appreciate advise of Diabetes Management  - Will give Levemir 40 units BID  - Aspart 15 units before meals  - SSI CBGs 2hr pp and fasting  Code Status: fulle  Family Communication: FOB present   Disposition Plan: admit  Time spent: [address]  Levie HeritageStinson,  J, [place]DO 08/12/2018 6:35 PM Faculty Practice Attending Physician [facility] Attending Phone #: [phone]

## 2018-08-12 NOTE — Progress Notes (Signed)
PRENATAL VISIT NOTE  Subjective:  Christie [name] is a 33 y.o. [phone] at 6064w6d being seen today for ongoing prenatal care.  She is currently monitored for the following issues for this high-risk pregnancy and has Type I diabetes mellitus with complication, uncontrolled (HCC); History of migraine during pregnancy; Asthma, mild intermittent; Adjustment disorder with depressed mood; Abnormal Papanicolaou smear of cervix with positive human papilloma virus (HPV) test; Back pain; History of loop electrical excision procedure (LEEP); Preexisting diabetes complicating pregnancy, antepartum; Supervision of high-risk pregnancy; Alpha+ thalassemia trait; Migraine; DKA (diabetic ketoacidoses) (HCC); Type 1 diabetes mellitus in pregnancy; Polyhydramnios in third trimester, antepartum complication; and Anxiety on their problem list.  Patient reports occasional contractions.  Contractions: Irregular. Vag. Bleeding: None.  Movement: Present. Denies leaking of fluid. Reports a lot of anxiety and depression, feels like it is not helping.   The following portions of the patient's history were reviewed and updated as appropriate: allergies, current medications, past family history, past medical history, past social history, past surgical history and problem list. Problem list updated.  Objective:   Vitals:   08/12/18 1030  BP: 111/78  Pulse: (!) 118  Weight: 239 lb 9.6 oz (108.7 kg)    Fetal Status: Fetal Heart Rate (bpm): 154   Movement: Present     General:  Alert, oriented and cooperative. Patient is in no acute distress.  Skin: Skin is warm and dry. No rash noted.   Cardiovascular: Normal heart rate noted  Respiratory: Normal respiratory effort, no problems with respiration noted  Abdomen: Soft, gravid, appropriate for gestational age.  Pain/Pressure: Present     Pelvic: Cervical exam performed closed        Extremities: Normal range of motion.  Edema: None  Mental Status: Normal mood and  affect. Normal behavior. Normal judgment and thought content.   Assessment and Plan:  Pregnancy: [id number] at 5264w6d  1. Supervision of high risk pregnancy in third trimester - Culture, beta strep (group b only) - GC[facility]Chlamydia [facility] ([place]  2. Polyhydramnios in third trimester, antepartum complication mild  3. Preexisting diabetes complicating pregnancy, antepartum - On insulin pump, managed by [facility], patient did not bring her log but  FG: this am 183, ranging 160s PP: 196 yesterday, 215 - Reviewed case with Dr. Judeth CornfieldShankar of MFM who recommends inpatient management with delivery at 37 weeks versus admission for delivery at 36 weeks - Reviewed poor control with patient and her mother, recommended inpatient management with plan for induction at 37 if DM can be controlled, versus option for induction at 36 weeks. She would like to avoid induction at 36 weeks and agrees to admission today for management. Reviewed that she would likely be hospitalized until delivery. Reviewed risks of induction at 36 weeks, including risk of respiratory distress. Reviewed risk of still birth despite testing and that she will be induced at 37 weeks.  - Will plan for admission today for T1DM and induction at 37 weeks - Had patient see Bev Gaylyn Rongaddock today who reviewed pump settings with her, patient only correcting 25% of time, increased basal rate of insulin to 3.5 /hr   4. Type I diabetes mellitus with complication, uncontrolled (HCC)  5. Anxiety Taking both buspar and buspirone, reviewed she should only be taking one She will bring bottles to next visit Feels like it is not helping, reviewed to cont taking until after pregnancy - occasionally has thoughts about hurting herself but states she would never hurt herself, denies  homicidal ideation, feels like she has good support system - declines to see Asher MuirJamie today  Preterm labor symptoms and general obstetric precautions including but not limited  to vaginal bleeding, contractions, leaking of fluid and fetal movement were reviewed in detail with the patient. Please refer to After Visit Summary for other counseling recommendations.  Return in about 1 week (around 08/19/2018) for OB visit (MD).  Future Appointments  Date Time Provider Department Center  08/14/2018 11:15 AM WH-MFC US 4 WH-MFCUS MFC-US  08/14/2018  1:15 PM WH-MFC NST WH-MFC MFC-US  08/18/2018  8:30 AM WH-MFC US 1 WH-MFCUS MFC-US  08/18/2018  9:45 AM WH-MFC NST WH-MFC MFC-US  08/18/2018  4:15 PM Levie HeritageStinson, Jacob J, DO WOC-WOCA WOC  08/20/2018  6:30 AM WH-BSSCHED ROOM WH-BSSCHED None    Conan BowensKelly M , MD

## 2018-08-12 NOTE — Telephone Encounter (Signed)
Pt called regarding instruction from Dr. Earlene Plater regarding diabetes management and induction.   After speaking to Dr. Earlene Plater, pt was called and informed of either making the decision to be admitted today for management of diabetes until induction on 2/12 or being induced at 36 weeks on 2/5. Pt verbalized that she would come and be admitted today for the management of her diabetes until the scheduled induction on on 2/12. Pt had no further questions.

## 2018-08-12 NOTE — Progress Notes (Signed)
Inpatient Diabetes Program Recommendations  AACE/ADA: New Consensus Statement on Inpatient Glycemic Control (2015)  Target Ranges:  Prepandial:   less than 140 mg/dL      Peak postprandial:   less than 180 mg/dL (1-2 hours)      Critically ill patients:  140 - 180 mg/dL   Lab Results  Component Value Date   GLUCAP 160 (H) 07/13/2018   HGBA1C 12.0 (H) 07/12/2018     Diabetes history: Type 1 DM Outpatient Diabetes medications: Medtronic insulin pump   Inpatient Diabetes Program Recommendations:    Patient to be a direct admit for blood glucose management leading up to induction. Discussed with Dr Adrian Blackwater that plan for admission would be to removed insulin pump and dose per SQ. Based off of readings reported from today at visit (FBG 183, PP 196, 215) and recent settings consider: - NPH 30 units BID - Novolog 10 units TID (assuming that patient is consuming >50% of meal). - Add Novolog 0-14 units AFTER meals under diabetic pregnant patient order set.   -Obtain CBGs FSBG, 2 hours post prandial, QHS   Has been on insulin pump, managed by [facility] and was seen today by Jenita Seashore who relayed the following settings:  Current insulin pump settings are as follows: Basal insulin  12A 3.5 units/hour Carb Coverage 1:4 1 unit for every 4 grams of carbohydrates Insulin Sensitivity 1:15 1 unit drops blood glucose 15 mg/dl Target Glucose Goals 34K-87G  120-130 mg/dl  Will plan to follow.   Thanks, Lujean Rave, MSN, RNC-OB Diabetes Coordinator [phone] (8a-5p)

## 2018-08-12 NOTE — Progress Notes (Signed)
Patient was seen on 2/04//2020 for type 1 Diabetes and pregnancy self-management follow up visit. EDD 09/02/2018. She is here with her mother today who participated actively in the visit today. Dr. Leroy Libman asked me to see this patient today due to elevated BG's.  She wears Medtronic insulin pump, she does not wear a CGM.  Current pump settings today: Basal Rate: 3.2 units/hour for a 24 hour total of 76.8 units from basal rate Bolus:  Insulin to Carb ratio: 4 units/Exchange (Carb Choice of 15 grams each)   Sensitivity Factor: 1 unit/15 mg/dl above target of 460 mg/dl  Target setting: 479-987 mg/dl  Patient states when asked that she takes her insulin for her food intake 100% of the time and is comfortable with current Carb Counting by Food Group and the use of Ex hanges (Carb Choices) She states when asked that she gives a Correction Bolus about 25% of the time so she is not putting her BG number into the pump so a correction dose can be delivered most of the time.   Dr. Earlene Plater and I agreed to increasing her Basal Rate by 10%  3.2 u/hour + 0.3 = 3.5 units hour I increased this setting in her pump from 3.2 to 3.5 units/hour for 24 hour total of 84 units. I also increased her Max Basal setting which was set at 3.5 u/hour to 5.0 u/hour to allow for further increases if needed in the future.   I reinforced the value of putting her BG into the pump so the Correction dose can be delivered and to keep her BG from remaining high for extended periods of time. She and her mother verbalized understanding of this.   Intervention:  Pump settings noted as follows:  Basal rate: 3.5 units per hour x 24 hours = 84 units/day  Bolus:  Insulin to Carb Ratio:  4 units / exchange (to match her actual meal time doses better)  Sensitivity Factor 1/15 mg/dl  Target 215-872 mg/dl  Active insulin: 4 hours Test BG 2 hours after meals as able and give a correction dose whenever BG is above 130 mg/dl  Patient  instructed to monitor glucose levels: FBS: 60 - 95 mg/dl 2 hour: <761 mg/dl  Patient received the following handouts:  No new handouts today  Patient has been recommended to be admitted to the hospital today due to elevated BG's and induction is planned for next week.

## 2018-08-13 DIAGNOSIS — O0993 Supervision of high risk pregnancy, unspecified, third trimester: Secondary | ICD-10-CM | POA: Diagnosis not present

## 2018-08-13 DIAGNOSIS — O24313 Unspecified pre-existing diabetes mellitus in pregnancy, third trimester: Secondary | ICD-10-CM | POA: Diagnosis not present

## 2018-08-13 DIAGNOSIS — O24913 Unspecified diabetes mellitus in pregnancy, third trimester: Secondary | ICD-10-CM | POA: Diagnosis not present

## 2018-08-13 LAB — GLUCOSE, CAPILLARY
Glucose-Capillary: 159 mg/dL — ABNORMAL HIGH (ref 70–99)
Glucose-Capillary: 263 mg/dL — ABNORMAL HIGH (ref 70–99)
Glucose-Capillary: 213 mg/dL — ABNORMAL HIGH (ref 70–99)
Glucose-Capillary: 216 mg/dL — ABNORMAL HIGH (ref 70–99)
Glucose-Capillary: 173 mg/dL — ABNORMAL HIGH (ref 70–99)

## 2018-08-13 LAB — HEMOGLOBIN A1C
Hgb A1c MFr Bld: 12.4 % — ABNORMAL HIGH (ref 4.8–5.6)
Mean Plasma Glucose: 309.18 mg/dL

## 2018-08-13 LAB — GC/CHLAMYDIA PROBE AMP (~~LOC~~) NOT AT ARMC
Chlamydia: NEGATIVE
Neisseria Gonorrhea: NEGATIVE

## 2018-08-13 MED ORDER — INSULIN DETEMIR 100 UNIT/ML ~~LOC~~ SOLN
44.0000 [IU] | Freq: Two times a day (BID) | SUBCUTANEOUS | Status: DC
  Administered 2018-08-13: 44 [IU] via SUBCUTANEOUS
  Filled 2018-08-13 (×2): qty 0.44

## 2018-08-13 MED ORDER — INSULIN ASPART 100 UNIT/ML ~~LOC~~ SOLN
0.0000 [IU] | Freq: Three times a day (TID) | SUBCUTANEOUS | Status: DC
  Administered 2018-08-13: 10 [IU] via SUBCUTANEOUS
  Administered 2018-08-14: 6 [IU] via SUBCUTANEOUS
  Administered 2018-08-14 (×2): 4 [IU] via SUBCUTANEOUS
  Administered 2018-08-15: 6 [IU] via SUBCUTANEOUS
  Administered 2018-08-15: 2 [IU] via SUBCUTANEOUS
  Administered 2018-08-16: 6 [IU] via SUBCUTANEOUS
  Administered 2018-08-16 (×2): 4 [IU] via SUBCUTANEOUS
  Administered 2018-08-17: 2 [IU] via SUBCUTANEOUS
  Administered 2018-08-17 – 2018-08-19 (×4): 6 [IU] via SUBCUTANEOUS

## 2018-08-13 MED ORDER — INSULIN ASPART 100 UNIT/ML ~~LOC~~ SOLN
0.0000 [IU] | Freq: Three times a day (TID) | SUBCUTANEOUS | Status: DC
  Administered 2018-08-13: 6 [IU] via SUBCUTANEOUS

## 2018-08-13 MED ORDER — BUSPIRONE HCL 10 MG PO TABS
10.0000 mg | ORAL_TABLET | Freq: Two times a day (BID) | ORAL | Status: DC
  Administered 2018-08-13 – 2018-08-19 (×13): 10 mg via ORAL
  Filled 2018-08-13 (×14): qty 1

## 2018-08-13 NOTE — Progress Notes (Signed)
Notified MD to review strip and also report cbg results at bedtime.

## 2018-08-13 NOTE — Progress Notes (Addendum)
Inpatient Diabetes Program Recommendations  ADA Standards of Care 2019 Diabetes in Pregnancy Target Glucose Ranges:  Fasting: 60 - 90 mg/dL Preprandial: 60 - 144 mg/dL 1 hr postprandial: Less than 140mg /dL (from first bite of meal) 2 hr postprandial: Less than 120 mg/dL (from first bit of meal)  Lab Results  Component Value Date   GLUCAP 213 (H) 08/13/2018   HGBA1C 12.4 (H) 08/13/2018    Review of Glycemic Control Results for Christie [name], Christie [name] (MRN [id number]) as of 08/13/2018 12:55  Ref. Range 08/12/2018 23:03 08/13/2018 08:36  Glucose-Capillary Latest Ref Range: 70 - 99 mg/dL 619 (H) 509 (H)   Diabetes history: Type 1 DM Outpatient Diabetes medications: Medtronic insulin pump Current orders for Inpatient glycemic control: Novolog 15 units TID, Novolog 0-24 units TID, Levemir 40 units BID  Inpatient Diabetes Program Recommendations:    Noted consult placement with additional insulin adjustments this AM. In agreement with increase to meal coverage.   Could also increase Levemir to 46 units BID. Anticipate further adjustments.  Fasting: 60 - 90 mg/dL Preprandial: 60 - 326 mg/dL Modified orders for meal coverage to include admin instructions to ensure safety in administration. And changed timing of correction to post prandial as written in administration instructions per MD.   Addendum @1545 : Spoke with patient regarding outpatient diabetes management. Patient reports that she has hypoglycemia awareness starting at 120's mg/dL, has been wearing an insulin pump and has struggled over the last few months of the pregnancy to achieve good glycemic control. Verified that she has Medicaid, even following the postpartum period. Patient was diagnosed 7 years ago and has been following up with Dr Sharl Ma, endocrinologist and [facility].  Reviewed patient's current A1c of 12.4%. Explained what a A1c is and what it measures. Also reviewed goal A1c with patient, importance of good glucose control @  home, and blood sugar goals. Reviewed patho of DM, need for insulin, role of pancreas, impact of hormonal fluctuations in pregnancy to increase insulin resistance, need for less insulin following pregnancy, impact to neonate, vascular changes and comorbidites. Patient has needed supplies, even post partum. Has a meter. Encouraged to check more frequently (2-4x's a day) following pregnancy because of risk for lows. Patient plans to make an appointment with Dr Sharl Ma in the next few days for follow up. Admits to drinking sugary beverages and consuming large amount of carbohydrates, especially to help with nausea in pregnancy. Reminded of the importance of improving choices based off of nutritional value, carb counting, and discussed potential for hypoglycemia during breastfeeding. Patient still having questions regarding carb counting and has been working with a RD outpatient and is requesting additional help.  Patient has no further questions regarding DM at this time.   Thanks, Lujean Rave, MSN, RNC-OB Diabetes Coordinator [phone] (8a-5p)

## 2018-08-13 NOTE — Progress Notes (Signed)
Daily Antepartum Note  Admission Date: 08/12/2018 Current Date: 08/13/2018 9:33 AM  Christie [name] is a 33 y.o. [id number] @ 7462w0d, HD#2, admitted for blood sugar control.  Pregnancy complicated by: Patient Active Problem List   Diagnosis Date Noted  . Anxiety 08/12/2018  . Hyperglycemia 08/12/2018  . Polyhydramnios in third trimester, antepartum complication 07/21/2018  . Type 1 diabetes mellitus in pregnancy 07/12/2018  . DKA (diabetic ketoacidoses) (HCC) 07/11/2018  . 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 Preexisting diabetes complicating pregnancy, antepartum 02/11/2018  . Supervision of high-risk pregnancy 02/11/2018  . History of loop electrical excision procedure (LEEP) 09/23/2017  . Back pain 04/27/2016  . Abnormal Papanicolaou smear of cervix with positive human papilloma virus (HPV) test 09/02/2015  . Adjustment disorder with depressed mood 08/03/2015  . Asthma, mild intermittent 07/06/2013  . Type I diabetes mellitus with complication, uncontrolled (HCC) 06/18/2011  . History of migraine during pregnancy 06/18/2011  . Migraine 06/18/2011    Overnight/24hr events:  none  Subjective:  No PTL s/s or decreased FM  Objective:    Current Vital Signs 24h Vital Sign Ranges  T 97.7 F (36.5 C) Temp  Avg: 98.1 F (36.7 C)  Min: 97.7 F (36.5 C)  Max: 98.4 F (36.9 C)  BP 110/84 BP  Min: 108/65  Max: 118/85  HR (!) 117 Pulse  Avg: 111.4  Min: 102  Max: 118  RR 18 Resp  Avg: 17.8  Min: 17  Max: 18  SaO2 100 % Room Air SpO2  Avg: 100 %  Min: 100 %  Max: 100 %       24 Hour I/O Current Shift I/O  Time Ins Outs No intake/output data recorded. No intake/output data recorded.   Patient Vitals for the past 24 hrs:  BP Temp Temp src Pulse Resp SpO2 Height Weight  08/13/18 0834 110/84 97.7 F (36.5 C) Oral (!) 117 18 100 % - -  08/12/18 2208 108/65 98.1 F (36.7 C) Oral (!) 102 17 100 % 5\' 9"  (1.753 m) 108.4 kg  08/12/18 1926 (!) 116/95 98.4 F (36.9 C) Oral (!) 112  18 100 % - -  08/12/18 1803 118/85 98.3 F (36.8 C) Oral (!) 108 18 100 % - -   FHT: 135 baseline, +accels, no decel, mod variability Toco: occasional UCs  Physical exam: General: Well nourished, well developed female in no acute distress. Abdomen: gravid nttp Cardiovascular: S1, S2 normal, no murmur, rub or gallop, regular rate and rhythm Respiratory: CTAB Extremities: no clubbing, cyanosis or edema Skin: Warm and dry.   Medications: Current Facility-Administered Medications  Medication Dose Route Frequency Provider Last Rate Last Dose  . acetaminophen (TYLENOL) tablet 650 mg  650 mg Oral Q4H PRN Levie HeritageStinson, Jacob J, DO      . albuterol (PROVENTIL) (2.5 MG/3ML) 0.083% nebulizer solution 3 mL  3 mL Inhalation Q6H PRN Levie HeritageStinson, Jacob J, DO      . aspirin EC tablet 81 mg  81 mg Oral Daily Levie HeritageStinson, Jacob J, DO   81 mg at 08/13/18 [id number]  . calcium carbonate (TUMS - dosed in mg elemental calcium) chewable tablet 400 mg of elemental calcium  2 tablet Oral Q4H PRN Levie HeritageStinson, Jacob J, DO      . cyclobenzaprine (FLEXERIL) tablet 10 mg  10 mg Oral Q8H PRN Levie HeritageStinson, Jacob J, DO      . docusate sodium (COLACE) capsule 100 mg  100 mg Oral Daily Levie HeritageStinson, Jacob J, DO   100 mg at 08/13/18  0258  . famotidine (PEPCID) tablet 20 mg  20 mg Oral BID Levie Heritage, DO   20 mg at 08/13/18 5277  . insulin aspart (novoLOG) injection 0-24 Units  0-24 Units Subcutaneous TID [place] Bing, MD      . insulin aspart (novoLOG) injection 15 Units  15 Units Subcutaneous TID AC Levie Heritage, DO   15 Units at 08/13/18 8242  . insulin detemir (LEVEMIR) injection 40 Units  40 Units Subcutaneous BID Levie Heritage, DO   40 Units at 08/12/18 2108  . prenatal multivitamin tablet 1 tablet  1 tablet Oral Q1200 Levie Heritage, DO   1 tablet at 08/13/18 3536    Labs:  Recent Labs  Lab 08/12/18 1829  WBC 5.5  HGB 12.6  HCT 38.5  PLT 190    Radiology: no new imaging  Assessment & Plan:  Pt stable *Pregnancy:  routine care. PNV. ASA *DM1: dm team following and appreciate recs. a1c ordered. Pt states am bs was 200. Continue to titrate. D/w pt rationale for 37wk delivery with better BS control beneficial for baby and mom. qday NST. bpp tomorrow.  *Preterm: no issues *PPx: SCDs, OOB ad lib *FEN/GI: DM diet *Dispo: 37wk IOL per MFM recs  Cornelia Copa MD Attending Center for [facility])

## 2018-08-14 ENCOUNTER — Encounter (HOSPITAL_COMMUNITY): Payer: Self-pay | Admitting: *Deleted

## 2018-08-14 ENCOUNTER — Inpatient Hospital Stay (HOSPITAL_COMMUNITY): Payer: Medicaid Other

## 2018-08-14 ENCOUNTER — Ambulatory Visit (HOSPITAL_COMMUNITY): Admission: RE | Admit: 2018-08-14 | Payer: Medicaid Other | Source: Ambulatory Visit

## 2018-08-14 ENCOUNTER — Encounter (HOSPITAL_COMMUNITY): Payer: Self-pay

## 2018-08-14 ENCOUNTER — Other Ambulatory Visit (HOSPITAL_COMMUNITY): Payer: Self-pay

## 2018-08-14 DIAGNOSIS — J45909 Unspecified asthma, uncomplicated: Secondary | ICD-10-CM | POA: Diagnosis not present

## 2018-08-14 DIAGNOSIS — O9989 Other specified diseases and conditions complicating pregnancy, childbirth and the puerperium: Secondary | ICD-10-CM

## 2018-08-14 DIAGNOSIS — O99213 Obesity complicating pregnancy, third trimester: Secondary | ICD-10-CM

## 2018-08-14 DIAGNOSIS — Z3A36 36 weeks gestation of pregnancy: Secondary | ICD-10-CM

## 2018-08-14 DIAGNOSIS — O24913 Unspecified diabetes mellitus in pregnancy, third trimester: Secondary | ICD-10-CM

## 2018-08-14 DIAGNOSIS — O09293 Supervision of pregnancy with other poor reproductive or obstetric history, third trimester: Secondary | ICD-10-CM

## 2018-08-14 DIAGNOSIS — O24013 Pre-existing diabetes mellitus, type 1, in pregnancy, third trimester: Secondary | ICD-10-CM

## 2018-08-14 LAB — GLUCOSE, CAPILLARY
Glucose-Capillary: 202 mg/dL — ABNORMAL HIGH (ref 70–99)
Glucose-Capillary: 294 mg/dL — ABNORMAL HIGH (ref 70–99)
Glucose-Capillary: 140 mg/dL — ABNORMAL HIGH (ref 70–99)
Glucose-Capillary: 220 mg/dL — ABNORMAL HIGH (ref 70–99)
Glucose-Capillary: 192 mg/dL — ABNORMAL HIGH (ref 70–99)
Glucose-Capillary: 168 mg/dL — ABNORMAL HIGH (ref 70–99)

## 2018-08-14 MED ORDER — INSULIN ASPART 100 UNIT/ML ~~LOC~~ SOLN
18.0000 [IU] | Freq: Three times a day (TID) | SUBCUTANEOUS | Status: DC
  Administered 2018-08-14 – 2018-08-15 (×3): 18 [IU] via SUBCUTANEOUS

## 2018-08-14 MED ORDER — INSULIN DETEMIR 100 UNIT/ML ~~LOC~~ SOLN
47.0000 [IU] | Freq: Two times a day (BID) | SUBCUTANEOUS | Status: DC
  Administered 2018-08-14 (×2): 47 [IU] via SUBCUTANEOUS
  Filled 2018-08-14 (×3): qty 0.47

## 2018-08-14 NOTE — Progress Notes (Signed)
Nutrition  Consult at request of Diabetes Coordinator. Pt adm for DM control, now 36 1/7 weeks, IOL at 37 weeks. Inpatient until delivery Pt has been followed in the outpt setting X 3 this pregnancy by B Paddock RD. Last session 2/4  Pt has no questions about CHO counting at this time. However she is hungry between meals. Have asked her to order double protein portions at all meals and order a whole sandwich with zero calorie beverage at HS snack   Elisabeth Cara M.Odis Luster LDN Neonatal Nutrition Support Specialist/RD III Pager [phone]      Phone [phone]

## 2018-08-14 NOTE — Progress Notes (Signed)
Inpatient Diabetes Program Recommendations  AACE/ADA: New Consensus Statement on Inpatient Glycemic Control (2015)  Target Ranges:  Prepandial:   less than 140 mg/dL      Peak postprandial:   less than 180 mg/dL (1-2 hours)      Critically ill patients:  140 - 180 mg/dL   Lab Results  Component Value Date   GLUCAP 173 (H) 08/13/2018   HGBA1C 12.4 (H) 08/13/2018    Review of Glycemic Control Results for TAURIE, GREENSLADE (MRN [id number]) as of 08/14/2018 10:11  Ref. Range 08/12/2018 23:03 08/13/2018 08:36 08/13/2018 11:54 08/13/2018 14:02  Glucose-Capillary Latest Ref Range: 70 - 99 mg/dL 224 (H) 825 (H) 003 (H) 173 (H)   Diabetes history: Type 1 DM Outpatient Diabetes medications: Medtronic insulin pump Current orders for Inpatient glycemic control: Novolog 15 units TID, Novolog 0-24 units TID, Levemir 47 units BID  Inpatient Diabetes Program Recommendations:    Noted changes made to Levemir. Consider also increasing Novolog 18 units TID (assuming that patient is consuming >50% of meal). Orders received from Dr Vergie Living. Thanks, Lujean Rave, MSN, RNC-OB Diabetes Coordinator [phone] (8a-5p)

## 2018-08-14 NOTE — Progress Notes (Signed)
Daily Antepartum Note  Admission Date: 08/12/2018 Current Date: 08/14/2018 9:53 AM  Christie [name] is a 33 y.o. [id number] @ [redacted]w[redacted]d, HD#3, admitted for blood sugar control.  Pregnancy complicated by: Patient Active Problem List   Diagnosis Date Noted  . Anxiety 08/12/2018  . Hyperglycemia 08/12/2018  . Polyhydramnios in third trimester, antepartum complication 07/21/2018  . Type 1 diabetes mellitus in pregnancy 07/12/2018  . DKA (diabetic ketoacidoses) (HCC) 07/11/2018  . 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 Preexisting diabetes complicating pregnancy, antepartum 02/11/2018  . Supervision of high-risk pregnancy 02/11/2018  . History of loop electrical excision procedure (LEEP) 09/23/2017  . Back pain 04/27/2016  . Abnormal Papanicolaou smear of cervix with positive human papilloma virus (HPV) test 09/02/2015  . Adjustment disorder with depressed mood 08/03/2015  . Asthma, mild intermittent 07/06/2013  . Type I diabetes mellitus with complication, uncontrolled (HCC) 06/18/2011  . History of migraine during pregnancy 06/18/2011  . Migraine 06/18/2011    Overnight/24hr events:  none  Subjective:  No PTL s/s or decreased FM  Objective:    Current Vital Signs 24h Vital Sign Ranges  T 98.7 F (37.1 C) Temp  Avg: 98.2 F (36.8 C)  Min: 97.9 F (36.6 C)  Max: 98.7 F (37.1 C)  BP 109/89 BP  Min: 108/69  Max: 116/82  HR (!) 115 Pulse  Avg: 113.3  Min: 98  Max: 136  RR 18 Resp  Avg: 18.2  Min: 16  Max: 20  SaO2 100 % Room Air SpO2  Avg: 99.5 %  Min: 97 %  Max: 100 %       24 Hour I/O Current Shift I/O  Time Ins Outs No intake/output data recorded. No intake/output data recorded.   Patient Vitals for the past 24 hrs:  BP Temp Temp src Pulse Resp SpO2  08/14/18 0757 109/89 98.7 F (37.1 C) Oral (!) 115 18 100 %  08/14/18 0430 110/83 98.1 F (36.7 C) Oral (!) 115 - 97 %  08/13/18 2341 113/79 98 F (36.7 C) Oral (!) 113 20 100 %  08/13/18 2029 108/69 98.2 F (36.8 C)  Oral (!) 103 19 100 %  08/13/18 1648 114/80 98.1 F (36.7 C) Oral (!) 136 18 100 %  08/13/18 1157 116/82 97.9 F (36.6 C) Oral 98 16 100 %   FHT: 140 baseline, +accels, no decel, mod variability Toco: occasional UCs  Physical exam: General: Well nourished, well developed female in no acute distress. Abdomen: gravid nttp Cardiovascular: S1, S2 normal, no murmur, rub or gallop, regular rate and rhythm Respiratory: CTAB Extremities: no clubbing, cyanosis or edema Skin: Warm and dry.   Medications: Current Facility-Administered Medications  Medication Dose Route Frequency Provider Last Rate Last Dose  . acetaminophen (TYLENOL) tablet 650 mg  650 mg Oral Q4H PRN Levie Heritage, DO      . albuterol (PROVENTIL) (2.5 MG/3ML) 0.083% nebulizer solution 3 mL  3 mL Inhalation Q6H PRN Levie Heritage, DO      . aspirin EC tablet 81 mg  81 mg Oral Daily Levie Heritage, DO   81 mg at 08/13/18 9024  . busPIRone (BUSPAR) tablet 10 mg  10 mg Oral BID [place] Bing, MD   10 mg at 08/13/18 2221  . calcium carbonate (TUMS - dosed in mg elemental calcium) chewable tablet 400 mg of elemental calcium  2 tablet Oral Q4H PRN Levie Heritage, DO      . cyclobenzaprine (FLEXERIL) tablet 10 mg  10 mg  Oral Q8H PRN Levie Heritage, DO      . docusate sodium (COLACE) capsule 100 mg  100 mg Oral Daily Levie Heritage, DO   100 mg at 08/13/18 1700  . famotidine (PEPCID) tablet 20 mg  20 mg Oral BID Levie Heritage, DO   20 mg at 08/13/18 2221  . insulin aspart (novoLOG) injection 0-24 Units  0-24 Units Subcutaneous TID Ilean Skill, MD   10 Units at 08/13/18 2132  . insulin aspart (novoLOG) injection 15 Units  15 Units Subcutaneous TID Kai Levins, MD   15 Units at 08/14/18 0757  . insulin detemir (LEVEMIR) injection 44 Units  44 Units Subcutaneous BID [place] Bing, MD   44 Units at 08/13/18 2221  . prenatal multivitamin tablet 1 tablet  1 tablet Oral Q1200 Levie Heritage, DO   1 tablet  at 08/13/18 1749    Labs:  Results for Christie, [name] (MRN [id number]) as of 08/14/2018 09:53  Ref. Range 08/13/2018 11:54 08/13/2018 14:02  Glucose-Capillary Latest Ref Range: 70 - 99 mg/dL 384 (H) 665 (H)   AM fasting today was 140  A1c: 12.4  Radiology: no new imaging  Assessment & Plan:  Pt stable *Pregnancy: routine care. PNV. ASA *DM1: dm team following and appreciate recs. Will increase nph to 47 bid. qday NSTs. BPP for today. Delivery at 37wks.  *Preterm: no issues *Psych: continue buspar *PPx: SCDs, OOB ad lib *FEN/GI: DM diet *Dispo: 37wk IOL per MFM recs. I npatient until then   Cornelia Copa MD Attending Center for [facility])

## 2018-08-14 NOTE — Progress Notes (Signed)
At 17:20 called to pt room with c/o ripping/burning sensation at low transverse abdomen when standing up from toilet. Pt pointed to location of typical cesarean incision.  She says the burning is superficial on the skin, not deep in the abdomen.  Pt placed on EFM and given ice pack for abdomen.  Reassuring FHR pattern with three contractions in one hour.  After an hour she said that pain was gone while sitting.  Will see how she feels when she stands again.

## 2018-08-15 LAB — GLUCOSE, CAPILLARY
GLUCOSE-CAPILLARY: 144 mg/dL — AB (ref 70–99)
GLUCOSE-CAPILLARY: 120 mg/dL — AB (ref 70–99)
Glucose-Capillary: 235 mg/dL — ABNORMAL HIGH (ref 70–99)
Glucose-Capillary: 191 mg/dL — ABNORMAL HIGH (ref 70–99)
Glucose-Capillary: 154 mg/dL — ABNORMAL HIGH (ref 70–99)
Glucose-Capillary: 201 mg/dL — ABNORMAL HIGH (ref 70–99)

## 2018-08-15 LAB — CULTURE, BETA STREP (GROUP B ONLY): Strep Gp B Culture: POSITIVE — AB

## 2018-08-15 MED ORDER — INSULIN ASPART 100 UNIT/ML ~~LOC~~ SOLN
21.0000 [IU] | Freq: Three times a day (TID) | SUBCUTANEOUS | Status: DC
  Administered 2018-08-15 (×2): 21 [IU] via SUBCUTANEOUS

## 2018-08-15 MED ORDER — POLYETHYLENE GLYCOL 3350 17 G PO PACK
17.0000 g | PACK | Freq: Two times a day (BID) | ORAL | Status: DC
  Administered 2018-08-15: 17 g via ORAL
  Filled 2018-08-15 (×9): qty 1

## 2018-08-15 MED ORDER — INSULIN DETEMIR 100 UNIT/ML ~~LOC~~ SOLN
50.0000 [IU] | Freq: Two times a day (BID) | SUBCUTANEOUS | Status: DC
  Administered 2018-08-15 – 2018-08-19 (×10): 50 [IU] via SUBCUTANEOUS
  Filled 2018-08-15 (×11): qty 0.5

## 2018-08-15 NOTE — Progress Notes (Signed)
Daily Antepartum Note  Admission Date: 08/12/2018 Current Date: 08/15/2018 9:49 AM  Christie [name] is a 33 y.o. [id number] @ [redacted]w[redacted]d, HD#4, admitted for blood sugar control.  Pregnancy complicated by: Patient Active Problem List   Diagnosis Date Noted  . Anxiety 08/12/2018  . Hyperglycemia 08/12/2018  . Polyhydramnios in third trimester, antepartum complication 07/21/2018  . Type 1 diabetes mellitus in pregnancy 07/12/2018  . DKA (diabetic ketoacidoses) (HCC) 07/11/2018  . 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 Preexisting diabetes complicating pregnancy, antepartum 02/11/2018  . Supervision of high-risk pregnancy 02/11/2018  . History of loop electrical excision procedure (LEEP) 09/23/2017  . Back pain 04/27/2016  . Abnormal Papanicolaou smear of cervix with positive human papilloma virus (HPV) test 09/02/2015  . Adjustment disorder with depressed mood 08/03/2015  . Asthma, mild intermittent 07/06/2013  . Type I diabetes mellitus with complication, uncontrolled (HCC) 06/18/2011  . History of migraine during pregnancy 06/18/2011  . Migraine 06/18/2011    Overnight/24hr events:  none  Subjective:  No PTL s/s or decreased FM  Objective:    Current Vital Signs 24h Vital Sign Ranges  T 98.1 F (36.7 C) Temp  Avg: 98.1 F (36.7 C)  Min: 97.7 F (36.5 C)  Max: 98.5 F (36.9 C)  BP 119/84 BP  Min: 111/79  Max: 128/91  HR 90 Pulse  Avg: 100.8  Min: 88  Max: 111  RR 18 Resp  Avg: 18.7  Min: 18  Max: 20  SaO2 100 % Room Air SpO2  Avg: 100 %  Min: 100 %  Max: 100 %       24 Hour I/O Current Shift I/O  Time Ins Outs No intake/output data recorded. No intake/output data recorded.   Patient Vitals for the past 24 hrs:  BP Temp Temp src Pulse Resp SpO2  08/15/18 0752 119/84 98.1 F (36.7 C) Oral 90 18 100 %  08/15/18 0530 (!) 128/91 98.5 F (36.9 C) Oral (!) 105 20 100 %  08/14/18 2330 113/76 98.4 F (36.9 C) Oral (!) 107 20 100 %  08/14/18 1932 128/90 97.8 F (36.6 C) Oral  88 18 100 %  08/14/18 1604 111/79 97.7 F (36.5 C) Oral (!) 104 18 100 %  08/14/18 1138 111/80 98.1 F (36.7 C) Oral (!) 111 18 100 %   FHT: 150 baseline, +accels, no decel, mod variability Toco: occasional UCs  Physical exam: General: Well nourished, well developed female in no acute distress. Abdomen: gravid nttp Cardiovascular: S1, S2 normal, no murmur, rub or gallop, regular rate and rhythm Respiratory: CTAB Extremities: no clubbing, cyanosis or edema Skin: Warm and dry.   Medications: Current Facility-Administered Medications  Medication Dose Route Frequency Provider Last Rate Last Dose  . acetaminophen (TYLENOL) tablet 650 mg  650 mg Oral Q4H PRN Levie Heritage, DO      . albuterol (PROVENTIL) (2.5 MG/3ML) 0.083% nebulizer solution 3 mL  3 mL Inhalation Q6H PRN Levie Heritage, DO      . aspirin EC tablet 81 mg  81 mg Oral Daily Levie Heritage, DO   81 mg at 08/14/18 1051  . busPIRone (BUSPAR) tablet 10 mg  10 mg Oral BID [place] Bing, MD   10 mg at 08/14/18 2120  . calcium carbonate (TUMS - dosed in mg elemental calcium) chewable tablet 400 mg of elemental calcium  2 tablet Oral Q4H PRN Levie Heritage, DO      . cyclobenzaprine (FLEXERIL) tablet 10 mg  10 mg Oral  Q8H PRN Levie Heritage, DO      . docusate sodium (COLACE) capsule 100 mg  100 mg Oral Daily Levie Heritage, DO   100 mg at 08/14/18 1051  . famotidine (PEPCID) tablet 20 mg  20 mg Oral BID Levie Heritage, DO   20 mg at 08/14/18 2120  . insulin aspart (novoLOG) injection 0-24 Units  0-24 Units Subcutaneous TID Ilean Skill, MD   4 Units at 08/14/18 2032  . insulin aspart (novoLOG) injection 18 Units  18 Units Subcutaneous TID Kai Levins, MD   18 Units at 08/15/18 0911  . insulin detemir (LEVEMIR) injection 47 Units  47 Units Subcutaneous BID [place] Bing, MD   47 Units at 08/14/18 2120  . prenatal multivitamin tablet 1 tablet  1 tablet Oral Q1200 Levie Heritage, DO   1 tablet at  08/14/18 1311    Labs:  Results for Christie [name], Christie [name] (MRN [id number]) as of 08/15/2018 10:19  Ref. Range 08/14/2018 07:49 08/14/2018 09:59 08/14/2018 10:46 08/14/2018 11:23 08/14/2018 13:09  Glucose-Capillary Latest Ref Range: 70 - 99 mg/dL 177 (H) 939 (H) 030 (H)  168 (H)   AM fasting today was 140s, per patient  A1c: 12.4  Radiology:  2/6 bpp: ceph, afi 28, bpp 10/10 1/13: ceph, afi 27, efw 2263gm, 76%, AC>97%  Assessment & Plan:  Pt stable *Pregnancy: routine care. PNV. ASA *DM1: dm team following and appreciate recs. Will increase nph to 50 bid and meal coverage to 21tidac. Pt states that she has a sandwich around 2000. I asked her to omit the bread. qday NSTs. BPP for today. Delivery at 37wks.  *Preterm: no issues *Psych: continue buspar *PPx: SCDs, OOB ad lib *FEN/GI: DM diet *Dispo: 37wk IOL per MFM recs. Inpatient until then   Cornelia Copa MD Attending Center for [facility])

## 2018-08-15 NOTE — Progress Notes (Signed)
Inpatient Diabetes Program Recommendations  AACE/ADA: New Consensus Statement on Inpatient Glycemic Control (2015)  Target Ranges:  Prepandial:   less than 140 mg/dL      Peak postprandial:   less than 180 mg/dL (1-2 hours)      Critically ill patients:  140 - 180 mg/dL   Lab Results  Component Value Date   GLUCAP 168 (H) 08/14/2018   HGBA1C 12.4 (H) 08/13/2018    Review of Glycemic Control Results for Christie [name], Christie [name] (MRN [id number]) as of 08/15/2018 08:45  Ref. Range 08/14/2018 07:49 08/14/2018 09:59 08/14/2018 10:46 08/14/2018 13:09  Glucose-Capillary Latest Ref Range: 70 - 99 mg/dL 778 (H) 242 (H) 353 (H) 168 (H)   Diabetes history:Type 1 DM Outpatient Diabetes medications:Medtronic insulin pump Current orders for Inpatient glycemic control:Novolog 18 units TID, Novolog 0-24 units TID, Levemir 47 units BID  Inpatient Diabetes Program Recommendations:  FSBG this am was 144 mg/dL, consider increasing Levemir to 52 units BID.  Additionally, post prandials still continue to be elevated above goal of 140 mg/dL, consider increasing meal coverage to Novolog 20 units TID (assuming patient is consuming >50% of meal).  Recurring issue with CBGs crossing over into system. Reached out to [facility] of Care again this AM for resolution.   Thanks, Lujean Rave, MSN, RNC-OB Diabetes Coordinator [phone] (8a-5p)

## 2018-08-15 NOTE — Progress Notes (Signed)
Pt 2 hr PP CBG was 120

## 2018-08-15 NOTE — Progress Notes (Signed)
Pt states she is ordering increased food for nighttime snack and was told she would get extra  insulin coverage afterwards. Dr Macon Large called at 1935 to verify. Pt will have CBG taken after night snack and get SS insulin accordingly.

## 2018-08-15 NOTE — Progress Notes (Signed)
OB Note  Pt comfortable, but does feel UCs, not getting stronger. Usually worse in AM she states S/p PO fluid hydration  145 baseine, +accels, no decel, mod var q5-19m UCs  NAD, texting on phone Cl/long/high  Okay to d/c efm Recheck cx prn miralax bid BS values improving.

## 2018-08-16 DIAGNOSIS — O24313 Unspecified pre-existing diabetes mellitus in pregnancy, third trimester: Secondary | ICD-10-CM | POA: Diagnosis not present

## 2018-08-16 LAB — GLUCOSE, CAPILLARY
Glucose-Capillary: 74 mg/dL (ref 70–99)
Glucose-Capillary: 155 mg/dL — ABNORMAL HIGH (ref 70–99)
Glucose-Capillary: 222 mg/dL — ABNORMAL HIGH (ref 70–99)
Glucose-Capillary: 176 mg/dL — ABNORMAL HIGH (ref 70–99)
Glucose-Capillary: 168 mg/dL — ABNORMAL HIGH (ref 70–99)

## 2018-08-16 MED ORDER — INSULIN ASPART 100 UNIT/ML ~~LOC~~ SOLN
23.0000 [IU] | Freq: Three times a day (TID) | SUBCUTANEOUS | Status: DC
  Administered 2018-08-16 – 2018-08-18 (×6): 23 [IU] via SUBCUTANEOUS

## 2018-08-16 MED ORDER — INSULIN ASPART 100 UNIT/ML ~~LOC~~ SOLN: 25.0000 [IU] | Freq: Three times a day (TID) | SUBCUTANEOUS | Status: DC

## 2018-08-16 NOTE — Plan of Care (Signed)
Plan of care discussed with pt, pt verbalized understanding, safety maintained. 

## 2018-08-16 NOTE — Progress Notes (Addendum)
Daily Antepartum Note  Admission Date: 08/12/2018 Current Date: 08/16/2018 6:30 AM  Christie [name] is a 33 y.o. [id number] @ 2711w3d, HD#5, admitted for blood sugar control.  Pregnancy complicated by: Patient Active Problem List   Diagnosis Date Noted  . Anxiety 08/12/2018  . Hyperglycemia 08/12/2018  . Polyhydramnios in third trimester, antepartum complication 07/21/2018  . Type 1 diabetes mellitus in pregnancy 07/12/2018  . DKA (diabetic ketoacidoses) (HCC) 07/11/2018  . 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 Preexisting diabetes complicating pregnancy, antepartum 02/11/2018  . Supervision of high-risk pregnancy 02/11/2018  . History of loop electrical excision procedure (LEEP) 09/23/2017  . Back pain 04/27/2016  . Abnormal Papanicolaou smear of cervix with positive human papilloma virus (HPV) test 09/02/2015  . Adjustment disorder with depressed mood 08/03/2015  . Asthma, mild intermittent 07/06/2013  . Type I diabetes mellitus with complication, uncontrolled (HCC) 06/18/2011  . History of migraine during pregnancy 06/18/2011  . Migraine 06/18/2011    Overnight/24hr events:  none  Subjective:  No PTL s/s or decreased FM  Objective:    Current Vital Signs 24h Vital Sign Ranges  T 98.1 F (36.7 C) Temp  Avg: 98.1 F (36.7 C)  Min: 98 F (36.7 C)  Max: 98.2 F (36.8 C)  BP 122/85 BP  Min: 108/77  Max: 122/85  HR 90 Pulse  Avg: 88  Min: 68  Max: 117  RR 16 Resp  Avg: 16.8  Min: 15  Max: 18  SaO2 99 % Room Air SpO2  Avg: 99.8 %  Min: 99 %  Max: 100 %       24 Hour I/O Current Shift I/O  Time Ins Outs No intake/output data recorded. No intake/output data recorded.   Patient Vitals for the past 24 hrs:  BP Temp Temp src Pulse Resp SpO2  08/16/18 0447 122/85 98.1 F (36.7 C) - 90 16 99 %  08/15/18 2314 108/77 98.2 F (36.8 C) Oral 75 15 100 %  08/15/18 1936 (!) 115/93 98 F (36.7 C) Oral (!) 117 16 100 %  08/15/18 1510 122/86 98.1 F (36.7 C) Oral 88 18 100 %   08/15/18 1132 115/86 98.1 F (36.7 C) Oral 68 18 100 %  08/15/18 0752 119/84 98.1 F (36.7 C) Oral 90 18 100 %   FHT: 150 baseline, +accels, no decel, mod variability Toco: occasional UCs  Physical exam: General: Well nourished, well developed female in no acute distress. Abdomen: gravid nttp Cardiovascular: S1, S2 normal, no murmur, rub or gallop, regular rate and rhythm Respiratory: CTAB Extremities: no clubbing, cyanosis or edema Skin: Warm and dry.   Medications: Current Facility-Administered Medications  Medication Dose Route Frequency Provider Last Rate Last Dose  . acetaminophen (TYLENOL) tablet 650 mg  650 mg Oral Q4H PRN Levie HeritageStinson, Jacob J, DO      . albuterol (PROVENTIL) (2.5 MG/3ML) 0.083% nebulizer solution 3 mL  3 mL Inhalation Q6H PRN Levie HeritageStinson, Jacob J, DO      . aspirin EC tablet 81 mg  81 mg Oral Daily Levie HeritageStinson, Jacob J, DO   81 mg at 08/15/18 [id number]  . busPIRone (BUSPAR) tablet 10 mg  10 mg Oral BID [place] BingPickens, Charlie, MD   10 mg at 08/15/18 2201  . calcium carbonate (TUMS - dosed in mg elemental calcium) chewable tablet 400 mg of elemental calcium  2 tablet Oral Q4H PRN Levie HeritageStinson, Jacob J, DO      . cyclobenzaprine (FLEXERIL) tablet 10 mg  10 mg Oral Q8H PRN Stinson,  Rhona Raider, DO      . famotidine (PEPCID) tablet 20 mg  20 mg Oral BID Levie Heritage, DO   20 mg at 08/15/18 2200  . insulin aspart (novoLOG) injection 0-24 Units  0-24 Units Subcutaneous TID Ilean Skill, MD   6 Units at 08/15/18 2159  . insulin aspart (novoLOG) injection 23 Units  23 Units Subcutaneous TID AC Lavada Langsam A, MD      . insulin detemir (LEVEMIR) injection 50 Units  50 Units Subcutaneous BID [place] Bing, MD   50 Units at 08/15/18 2200  . polyethylene glycol (MIRALAX / GLYCOLAX) packet 17 g  17 g Oral BID [place] Bing, MD   17 g at 08/15/18 2201  . prenatal multivitamin tablet 1 tablet  1 tablet Oral Q1200 Levie Heritage, DO   1 tablet at 08/15/18 1129    Recent Labs     08/15/18 0802 08/15/18 1108 08/15/18 1508 08/15/18 2154 08/16/18 0628  GLUCAP 144* 120* 154* 201* 74   A1c: 12.4  Radiology:  2/6 bpp: ceph, afi 28, bpp 10/10 1/13: ceph, afi 27, efw 2263gm, 76%, AC>97%  Assessment & Plan:  Pt stable *Pregnancy: routine care. PNV. ASA *DM1: Reviewed CBGs, she had about 8 extra units of Aspart and fastings are normal range (74 today). Will increase meal coverage to 23 tid ac and leave Detemir at 50 bid. Daily NSTs.  Delivery at 37wks.  *Preterm: no issues *Psych: continue buspar *PPx: SCDs, OOB ad lib *FEN/GI: DM diet *Dispo: 37wk IOL per MFM recommendations, already scheduled. Inpatient until then.    [name], MD, FACOG Obstetrician & Gynecologist, [facility] for [employer], [facility]

## 2018-08-16 NOTE — Progress Notes (Signed)
Pt left unit for 2 hours. Pt returned to unit around 14:15 after getting her nails done. Dr. Alysia Penna notified.

## 2018-08-17 DIAGNOSIS — O24313 Unspecified pre-existing diabetes mellitus in pregnancy, third trimester: Secondary | ICD-10-CM | POA: Diagnosis not present

## 2018-08-17 LAB — GLUCOSE, CAPILLARY
GLUCOSE-CAPILLARY: 126 mg/dL — AB (ref 70–99)
Glucose-Capillary: 63 mg/dL — ABNORMAL LOW (ref 70–99)
Glucose-Capillary: 105 mg/dL — ABNORMAL HIGH (ref 70–99)
Glucose-Capillary: 214 mg/dL — ABNORMAL HIGH (ref 70–99)
Glucose-Capillary: 120 mg/dL — ABNORMAL HIGH (ref 70–99)

## 2018-08-17 MED ORDER — PROMETHAZINE HCL 12.5 MG PO TABS
12.5000 mg | ORAL_TABLET | Freq: Four times a day (QID) | ORAL | Status: DC | PRN
  Administered 2018-08-17: 25 mg via ORAL
  Filled 2018-08-17 (×2): qty 2

## 2018-08-17 NOTE — Progress Notes (Signed)
FACULTY PRACTICE ANTEPARTUM PROGRESS NOTE  Octavio GravesKinyetta D Hagwood is a 33 y.o. [phone] at 9282w4d who is admitted for uncontrolled T1DM.  Estimated Date of Delivery: 09/10/18 Fetal presentation is cephalic.  Length of Stay:  0 Days. Admitted 08/12/2018  Subjective:  Patient reports normal fetal movement.  She denies uterine contractions, denies bleeding and leaking of fluid per vagina. No complaints.  Vitals:  Blood pressure 126/85, pulse 89, temperature 98.5 F (36.9 C), temperature source Oral, resp. rate 17, height 5\' 9"  (1.753 m), weight 108.4 kg, last menstrual period 12/04/2017, SpO2 99 %, unknown if currently breastfeeding. Physical Examination: CONSTITUTIONAL: Well-developed, well-nourished female in no acute distress.  HENT:  Normocephalic, atraumatic, External right and left ear normal. Oropharynx is clear and moist EYES: Conjunctivae and EOM are normal. Pupils are equal, round, and reactive to light. No scleral icterus.  NECK: Normal range of motion, supple, no masses. SKIN: Skin is warm and dry. No rash noted. Not diaphoretic. No erythema. No pallor. NEUROLGIC: Alert and oriented to person, place, and time. Normal reflexes, muscle tone coordination. No cranial nerve deficit noted. PSYCHIATRIC: Normal mood and affect. Normal behavior. Normal judgment and thought content. CARDIOVASCULAR: Normal heart rate noted, regular rhythm RESPIRATORY: Effort and breath sounds normal, no problems with respiration noted MUSCULOSKELETAL: Normal range of motion. No edema and no tenderness. ABDOMEN: Soft, nontender, nondistended, gravid. CERVIX: deferred  Last done yesterday am, due for this am Fetal monitoring: FHR: 130 bpm, Variability: moderate, Accelerations: Present, Decelerations: Absent  Uterine activity: no contractions per hour  Results for orders placed or performed during the hospital encounter of 08/12/18 (from the past 48 hour(s))  Glucose, capillary     Status: Abnormal   Collection  Time: 08/15/18 11:08 AM  Result Value Ref Range   Glucose-Capillary 120 (H) 70 - 99 mg/dL  Glucose, capillary     Status: Abnormal   Collection Time: 08/15/18  3:08 PM  Result Value Ref Range   Glucose-Capillary 154 (H) 70 - 99 mg/dL  Glucose, capillary     Status: Abnormal   Collection Time: 08/15/18  9:54 PM  Result Value Ref Range   Glucose-Capillary 201 (H) 70 - 99 mg/dL  Glucose, capillary     Status: None   Collection Time: 08/16/18  6:28 AM  Result Value Ref Range   Glucose-Capillary 74 70 - 99 mg/dL  Glucose, capillary     Status: Abnormal   Collection Time: 08/16/18  8:52 AM  Result Value Ref Range   Glucose-Capillary 155 (H) 70 - 99 mg/dL  Glucose, capillary     Status: Abnormal   Collection Time: 08/16/18 11:22 AM  Result Value Ref Range   Glucose-Capillary 222 (H) 70 - 99 mg/dL  Glucose, capillary     Status: Abnormal   Collection Time: 08/16/18  5:21 PM  Result Value Ref Range   Glucose-Capillary 176 (H) 70 - 99 mg/dL   Comment 1 Notify RN   Glucose, capillary     Status: Abnormal   Collection Time: 08/16/18 10:13 PM  Result Value Ref Range   Glucose-Capillary 168 (H) 70 - 99 mg/dL    No results found.  Current scheduled medications . aspirin EC  81 mg Oral Daily  . busPIRone  10 mg Oral BID  . famotidine  20 mg Oral BID  . insulin aspart  0-24 Units Subcutaneous TID PC  . insulin aspart  23 Units Subcutaneous TID AC  . insulin detemir  50 Units Subcutaneous BID  . polyethylene glycol  17 g Oral BID  . prenatal multivitamin  1 tablet Oral Q1200    I have reviewed the patient's current medications.  ASSESSMENT: Principal Problem:   Preexisting diabetes complicating pregnancy, antepartum Active Problems:   Type I diabetes mellitus with complication, uncontrolled (HCC)   Alpha+ thalassemia trait   Polyhydramnios in third trimester, antepartum complication   Hyperglycemia   PLAN: Pt stable *Pregnancy: routine care. PNV. ASA *DM1: cont meal  coverage at 23 tid ac and leave Detemir at 50 bid.  *Preterm: no issues *Psych: continue buspar *PPx: SCDs, OOB ad lib *FEN/GI: DM diet *Dispo: 37wk IOL per MFM recommendations, already scheduled. Inpatient until then.   Continue routine antenatal care.   Baldemar Lenis, M.D. Attending Center for [employer] (Faculty Practice)  08/17/2018 8:03 AM

## 2018-08-17 NOTE — Progress Notes (Signed)
Pt going off unit. ?

## 2018-08-17 NOTE — Progress Notes (Signed)
Hypoglycemic Event  CBG: 0818- 63  Treatment: Pt was given breakfast tray.  Symptoms: none  Follow-up CBG: Time: CBG Result:  Possible Reasons for Event: Pt stated she was not given her midnight snack.  Comments/MD notified: Pt received breakfast tray. Will follow up 15 minutes after pt eat breakfast.    Laury Axon

## 2018-08-18 ENCOUNTER — Encounter: Payer: Self-pay | Admitting: Family Medicine

## 2018-08-18 ENCOUNTER — Ambulatory Visit (HOSPITAL_COMMUNITY): Admission: RE | Admit: 2018-08-18 | Payer: Medicaid Other | Source: Ambulatory Visit

## 2018-08-18 ENCOUNTER — Inpatient Hospital Stay (HOSPITAL_COMMUNITY): Payer: Medicaid Other

## 2018-08-18 ENCOUNTER — Encounter (HOSPITAL_COMMUNITY): Payer: Self-pay

## 2018-08-18 ENCOUNTER — Ambulatory Visit (HOSPITAL_COMMUNITY): Payer: Medicaid Other

## 2018-08-18 DIAGNOSIS — D563 Thalassemia minor: Secondary | ICD-10-CM | POA: Diagnosis present

## 2018-08-18 DIAGNOSIS — O9952 Diseases of the respiratory system complicating childbirth: Secondary | ICD-10-CM | POA: Diagnosis present

## 2018-08-18 DIAGNOSIS — J45909 Unspecified asthma, uncomplicated: Secondary | ICD-10-CM

## 2018-08-18 DIAGNOSIS — F419 Anxiety disorder, unspecified: Secondary | ICD-10-CM | POA: Diagnosis present

## 2018-08-18 DIAGNOSIS — F329 Major depressive disorder, single episode, unspecified: Secondary | ICD-10-CM | POA: Diagnosis present

## 2018-08-18 DIAGNOSIS — O99344 Other mental disorders complicating childbirth: Secondary | ICD-10-CM | POA: Diagnosis present

## 2018-08-18 DIAGNOSIS — O99824 Streptococcus B carrier state complicating childbirth: Secondary | ICD-10-CM | POA: Diagnosis present

## 2018-08-18 DIAGNOSIS — J4520 Mild intermittent asthma, uncomplicated: Secondary | ICD-10-CM | POA: Diagnosis present

## 2018-08-18 DIAGNOSIS — Z9641 Presence of insulin pump (external) (internal): Secondary | ICD-10-CM | POA: Diagnosis present

## 2018-08-18 DIAGNOSIS — O1205 Gestational edema, complicating the puerperium: Secondary | ICD-10-CM | POA: Diagnosis not present

## 2018-08-18 DIAGNOSIS — O24013 Pre-existing diabetes mellitus, type 1, in pregnancy, third trimester: Secondary | ICD-10-CM | POA: Diagnosis not present

## 2018-08-18 DIAGNOSIS — O9989 Other specified diseases and conditions complicating pregnancy, childbirth and the puerperium: Secondary | ICD-10-CM

## 2018-08-18 DIAGNOSIS — O24313 Unspecified pre-existing diabetes mellitus in pregnancy, third trimester: Secondary | ICD-10-CM | POA: Diagnosis not present

## 2018-08-18 DIAGNOSIS — Z362 Encounter for other antenatal screening follow-up: Secondary | ICD-10-CM | POA: Diagnosis not present

## 2018-08-18 DIAGNOSIS — O09293 Supervision of pregnancy with other poor reproductive or obstetric history, third trimester: Secondary | ICD-10-CM | POA: Diagnosis not present

## 2018-08-18 DIAGNOSIS — O403XX0 Polyhydramnios, third trimester, not applicable or unspecified: Secondary | ICD-10-CM | POA: Diagnosis present

## 2018-08-18 DIAGNOSIS — Z3A37 37 weeks gestation of pregnancy: Secondary | ICD-10-CM | POA: Diagnosis not present

## 2018-08-18 DIAGNOSIS — K5900 Constipation, unspecified: Secondary | ICD-10-CM | POA: Diagnosis present

## 2018-08-18 DIAGNOSIS — E1065 Type 1 diabetes mellitus with hyperglycemia: Secondary | ICD-10-CM | POA: Diagnosis present

## 2018-08-18 DIAGNOSIS — O411230 Chorioamnionitis, third trimester, not applicable or unspecified: Secondary | ICD-10-CM | POA: Diagnosis present

## 2018-08-18 DIAGNOSIS — Z3A35 35 weeks gestation of pregnancy: Secondary | ICD-10-CM | POA: Diagnosis not present

## 2018-08-18 DIAGNOSIS — Z3A36 36 weeks gestation of pregnancy: Secondary | ICD-10-CM

## 2018-08-18 DIAGNOSIS — O2402 Pre-existing diabetes mellitus, type 1, in childbirth: Secondary | ICD-10-CM | POA: Diagnosis present

## 2018-08-18 DIAGNOSIS — E1165 Type 2 diabetes mellitus with hyperglycemia: Secondary | ICD-10-CM | POA: Diagnosis present

## 2018-08-18 DIAGNOSIS — O99213 Obesity complicating pregnancy, third trimester: Secondary | ICD-10-CM

## 2018-08-18 DIAGNOSIS — Z794 Long term (current) use of insulin: Secondary | ICD-10-CM | POA: Diagnosis not present

## 2018-08-18 DIAGNOSIS — Z302 Encounter for sterilization: Secondary | ICD-10-CM | POA: Diagnosis not present

## 2018-08-18 LAB — GLUCOSE, CAPILLARY
Glucose-Capillary: 131 mg/dL — ABNORMAL HIGH (ref 70–99)
Glucose-Capillary: 208 mg/dL — ABNORMAL HIGH (ref 70–99)
Glucose-Capillary: 237 mg/dL — ABNORMAL HIGH (ref 70–99)
Glucose-Capillary: 119 mg/dL — ABNORMAL HIGH (ref 70–99)
Glucose-Capillary: 86 mg/dL (ref 70–99)

## 2018-08-18 MED ORDER — INSULIN ASPART 100 UNIT/ML ~~LOC~~ SOLN
25.0000 [IU] | Freq: Three times a day (TID) | SUBCUTANEOUS | Status: DC
  Administered 2018-08-18 – 2018-08-19 (×2): 25 [IU] via SUBCUTANEOUS

## 2018-08-18 NOTE — Progress Notes (Signed)
Inpatient Diabetes Program Recommendations   Diabetes Treatment Program Recommendations  ADA Standards of Care 2018 Diabetes in Pregnancy Target Glucose Ranges:  Fasting: 60 - 90 mg/dL Preprandial: 60 - 294 mg/dL 1 hr postprandial: Less than 140mg /dL (from first bite of meal) 2 hr postprandial: Less than 120 mg/dL (from first bite of meal)      Lab Results  Component Value Date   GLUCAP 131 (H) 08/18/2018   HGBA1C 12.4 (H) 08/13/2018    Review of Glycemic Control Results for Christie, [name] (MRN [id number]) as of 08/18/2018 09:38  Ref. Range 08/17/2018 15:45 08/17/2018 22:28 08/18/2018 07:54  Glucose-Capillary Latest Ref Range: 70 - 99 mg/dL 465 (H) 681 (H) 275 (H)   Diabetes history:Type 1 DM Outpatient Diabetes medications:Medtronic insulin pump Current orders for Inpatient glycemic control:Novolog 23 units TID, Novolog 0-24 units TID, Levemir 50 units BID  Inpatient Diabetes Program Recommendations:  Patient continues to have increased post prandials, exceeding inpatient goals of >140 mg/dL. Consider increasing Novolog 25 units TID (assuming that patient is consuming >50% of meal).  Thanks, Lujean Rave, MSN, RNC-OB Diabetes Coordinator [phone] (8a-5p)

## 2018-08-18 NOTE — Progress Notes (Signed)
FACULTY PRACTICE ANTEPARTUM PROGRESS NOTE  Christie [name] is a 33 y.o. [id number] at [redacted]w[redacted]d who is admitted for uncontrolled T1DM.  Estimated Date of Delivery: 09/10/18 Fetal presentation is cephalic.  Admitted 08/12/2018  Subjective: No complaints. Patient reports normal fetal movement.  She denies uterine contractions, denies bleeding and leaking of fluid per vagina. No complaints.  Vitals:  Blood pressure 123/86, pulse 84, temperature 98.7 F (37.1 C), temperature source Oral, resp. rate 18, height 5\' 9"  (1.753 m), weight 108.4 kg, last menstrual period 12/04/2017, SpO2 100 %, unknown if currently breastfeeding. Physical Examination: CONSTITUTIONAL: Well-developed, well-nourished female in no acute distress.  HENT:  Normocephalic, atraumatic, External right and left ear normal. Oropharynx is clear and moist EYES: Conjunctivae and EOM are normal. Pupils are equal, round, and reactive to light. No scleral icterus.  NECK: Normal range of motion, supple, no masses. SKIN: Skin is warm and dry. No rash noted. Not diaphoretic. No erythema. No pallor. NEUROLGIC: Alert and oriented to person, place, and time. Normal reflexes, muscle tone coordination. No cranial nerve deficit noted. PSYCHIATRIC: Normal mood and affect. Normal behavior. Normal judgment and thought content. CARDIOVASCULAR: Normal heart rate noted, regular rhythm RESPIRATORY: Effort and breath sounds normal, no problems with respiration noted MUSCULOSKELETAL: Normal range of motion. No edema and no tenderness. ABDOMEN: Soft, nontender, nondistended, gravid. CERVIX: deferred  Fetal monitoring: FHR: 145 bpm, Variability: moderate, Accelerations: Present, Decelerations: Absent  Uterine activity: 1-2 contractions per hour  Results for orders placed or performed during the hospital encounter of 08/12/18 (from the past 48 hour(s))  Glucose, capillary     Status: Abnormal   Collection Time: 08/16/18  5:21 PM  Result Value Ref Range    Glucose-Capillary 176 (H) 70 - 99 mg/dL   Comment 1 Notify RN   Glucose, capillary     Status: Abnormal   Collection Time: 08/16/18 10:13 PM  Result Value Ref Range   Glucose-Capillary 168 (H) 70 - 99 mg/dL  Glucose, capillary     Status: Abnormal   Collection Time: 08/17/18  8:18 AM  Result Value Ref Range   Glucose-Capillary 63 (L) 70 - 99 mg/dL  Glucose, capillary     Status: Abnormal   Collection Time: 08/17/18  8:48 AM  Result Value Ref Range   Glucose-Capillary 105 (H) 70 - 99 mg/dL  Glucose, capillary     Status: Abnormal   Collection Time: 08/17/18 10:21 AM  Result Value Ref Range   Glucose-Capillary 126 (H) 70 - 99 mg/dL  Glucose, capillary     Status: Abnormal   Collection Time: 08/17/18  3:45 PM  Result Value Ref Range   Glucose-Capillary 214 (H) 70 - 99 mg/dL   Comment 1 Notify RN   Glucose, capillary     Status: Abnormal   Collection Time: 08/17/18 10:28 PM  Result Value Ref Range   Glucose-Capillary 120 (H) 70 - 99 mg/dL  Glucose, capillary     Status: Abnormal   Collection Time: 08/18/18  7:54 AM  Result Value Ref Range   Glucose-Capillary 131 (H) 70 - 99 mg/dL  Glucose, capillary     Status: Abnormal   Collection Time: 08/18/18 10:12 AM  Result Value Ref Range   Glucose-Capillary 208 (H) 70 - 99 mg/dL   Imaging [place] Mfm Fetal Bpp Wo Non Stress  Result Date: 08/18/2018 ----------------------------------------------------------------------  OBSTETRICS REPORT                       (Signed Final 08/18/2018  08:17 am) ---------------------------------------------------------------------- Patient Info  ID #:       [id number]                          D.O.B.:  August 16, 1985 (32 yrs)  Name:       Christie [name]             Visit Date: 08/18/2018 07:08 am ---------------------------------------------------------------------- Performed By  Performed By:     Hurman Horn          Ref. Address:     [address]                                                             [place], [place]                                                             [address]  Attending:        Noralee Space MD        Location:         [facility]  Referred By:      Tereso Newcomer MD ---------------------------------------------------------------------- Orders   #  Description                          Code         Ordered By   1  [place] MFM OB FOLLOW UP                  19147.82     RAVI SHANKAR   2  [place] MFM FETAL BPP WO NON              95621.30     RAVI [facility]      STRESS  ----------------------------------------------------------------------   #  Order #                    Accession #                 Episode #   1  [id number]                  [id number]                  [id number]   2  [id number]                  [id number]  [id number]  ---------------------------------------------------------------------- Indications   Pre-existing diabetes, type 1, in pregnancy,   O24.013   third trimester (Novolog)   Poor obstetric history: Previous               O09.299   preeclampsia / eclampsia/gestational HTN   Asthma (Mild, Intermittent)                    O99.89 j45.909   Obesity complicating pregnancy, third          O99.213   trimester   [redacted] weeks gestation of pregnancy                Z3A.36  ---------------------------------------------------------------------- Vital Signs                                                 Height:        5'9" ---------------------------------------------------------------------- Fetal Evaluation  Num Of Fetuses:         1  Fetal Heart Rate(bpm):  142  Cardiac Activity:       Observed  Presentation:           Cephalic  Placenta:               Posterior  P. Cord Insertion:      Visualized, central  Amniotic Fluid  AFI FV:      Polyhydramnios  AFI Sum(cm)     %Tile       Largest Pocket(cm)  27.87           > 97        8.33  RUQ(cm)       RLQ(cm)       LUQ(cm)         LLQ(cm)  8.33          7.97          6              5.57 ---------------------------------------------------------------------- Biophysical Evaluation  Amniotic F.V:   Polyhydramnios             F. Tone:        Observed  F. Movement:    Observed                   Score:          8/8  F. Breathing:   Observed ---------------------------------------------------------------------- Biometry  BPD:      88.4  mm     G. Age:  35w 5d         35  %    CI:        74.53   %    70 - 86                                                          FL/HC:      20.6   %    20.8 - 22.6  HC:       325   mm     G. Age:  36w 6d         25  %    HC/AC:  0.85        0.92 - 1.05  AC:      384.4  mm     G. Age:  N/A          > 97  %    FL/BPD:     75.7   %    71 - 87  FL:       66.9  mm     G. Age:  34w 3d          6  %    FL/AC:      17.4   %    20 - 24  Est. FW:    3768  gm      8 lb 5 oz   > 90  % ---------------------------------------------------------------------- OB History  Gravidity:    4         Term:   2  TOP:          1        Living:  2 ---------------------------------------------------------------------- Gestational Age  LMP:           36w 5d        Date:  12/04/17                 EDD:   09/10/18  U/S Today:     35w 5d                                        EDD:   09/17/18  Best:          36w 5d     Det. By:  LMP  (12/04/17)          EDD:   09/10/18 ---------------------------------------------------------------------- Anatomy  Cranium:               Appears normal         LVOT:                   Previously seen  Cavum:                 Previously seen        Aortic Arch:            Previously seen  Ventricles:            Previously seen        Ductal Arch:            Previously seen  Choroid Plexus:        Previously seen        Diaphragm:              Appears normal  Cerebellum:            Previously seen        Stomach:                Appears normal, left                                                                         sided  Posterior Fossa:       Previously  seen        Abdomen:                Appears normal  Nuchal Fold:           Not applicable (>20    Abdominal Wall:         Previously seen                         wks GA)  Face:                  Orbits and profile     Cord Vessels:           Previously seen                         previously seen  Lips:                  Appears normal         Kidneys:                Appear normal  Palate:                Not well visualized    Bladder:                Appears normal  Thoracic:              Appears normal         Spine:                  Previously seen  Heart:                 Previously seen        Upper Extremities:      Previously seen  RVOT:                  Previously seen        Lower Extremities:      Previously seen  Other:  Heels, feet, and lenses previously visualized. Technically difficult due          to maternal habitus and fetal position. ---------------------------------------------------------------------- Impression  Patient with pregestational diabetes admitted for control  returned for antenatal testing.  Polyhydramnios is seen again. Antenatal testing is  reassuring. BPP 8/8. ---------------------------------------------------------------------- Recommendations  Delivery at 37 weeks. ----------------------------------------------------------------------                  Noralee Space, MD Electronically Signed Final Report   08/18/2018 08:17 am ----------------------------------------------------------------------  [place] Mfm Ob Follow Up  Result Date: 08/18/2018 ----------------------------------------------------------------------  OBSTETRICS REPORT                       (Signed Final 08/18/2018 08:17 am) ---------------------------------------------------------------------- Patient Info  ID #:       [id number]                          D.O.B.:  17-Jun-1986 (32 yrs)  Name:       Christie [name]             Visit Date: 08/18/2018 07:08 am  ---------------------------------------------------------------------- Performed By  Performed By:     Hurman Horn          Ref. Address:     [address]  [address]                                                             [place], [place]                                                             [address]  Attending:        Noralee Space MD        Location:         [facility]  Referred By:      Tereso Newcomer MD ---------------------------------------------------------------------- Orders   #  Description                          Code         Ordered By   1  [place] MFM OB FOLLOW UP                  [id number]     RAVI SHANKAR   2  [place] MFM FETAL BPP WO NON              76819.01     [facility]      STRESS  ----------------------------------------------------------------------   #  Order #                    Accession #                 Episode #   1  [id number]                  [id number]                  [id number]   2  [id number]                  [id number]                  [id number]  ---------------------------------------------------------------------- Indications   Pre-existing diabetes, type 1, in pregnancy,   O24.013   third trimester (Novolog)   Poor obstetric history: Previous               O09.299   preeclampsia / eclampsia/gestational HTN   Asthma (Mild, Intermittent)                    O99.89 j45.909   Obesity complicating pregnancy, third          O99.213   trimester   [redacted] weeks gestation of pregnancy  Z3A.36  ---------------------------------------------------------------------- Vital Signs                                                 Height:        5'9" ---------------------------------------------------------------------- Fetal Evaluation  Num Of Fetuses:         1  Fetal Heart Rate(bpm):  142  Cardiac Activity:       Observed  Presentation:           Cephalic  Placenta:                Posterior  P. Cord Insertion:      Visualized, central  Amniotic Fluid  AFI FV:      Polyhydramnios  AFI Sum(cm)     %Tile       Largest Pocket(cm)  27.87           > 97        8.33  RUQ(cm)       RLQ(cm)       LUQ(cm)        LLQ(cm)  8.33          7.97          6              5.57 ---------------------------------------------------------------------- Biophysical Evaluation  Amniotic F.V:   Polyhydramnios             F. Tone:        Observed  F. Movement:    Observed                   Score:          8/8  F. Breathing:   Observed ---------------------------------------------------------------------- Biometry  BPD:      88.4  mm     G. Age:  35w 5d         35  %    CI:        74.53   %    70 - 86                                                          FL/HC:      20.6   %    20.8 - 22.6  HC:       325   mm     G. Age:  36w 6d         25  %    HC/AC:      0.85        0.92 - 1.05  AC:      384.4  mm     G. Age:  N/A          > 97  %    FL/BPD:     75.7   %    71 - 87  FL:       66.9  mm     G. Age:  34w 3d          6  %    FL/AC:      17.4   %    20 - 24  Est. FW:  3768  gm      8 lb 5 oz   > 90  % ---------------------------------------------------------------------- OB History  Gravidity:    4         Term:   2  TOP:          1        Living:  2 ---------------------------------------------------------------------- Gestational Age  LMP:           36w 5d        Date:  12/04/17                 EDD:   09/10/18  U/S Today:     35w 5d                                        EDD:   09/17/18  Best:          36w 5d     Det. By:  LMP  (12/04/17)          EDD:   09/10/18 ---------------------------------------------------------------------- Anatomy  Cranium:               Appears normal         LVOT:                   Previously seen  Cavum:                 Previously seen        Aortic Arch:            Previously seen  Ventricles:            Previously seen        Ductal Arch:            Previously seen  Choroid Plexus:         Previously seen        Diaphragm:              Appears normal  Cerebellum:            Previously seen        Stomach:                Appears normal, left                                                                        sided  Posterior Fossa:       Previously seen        Abdomen:                Appears normal  Nuchal Fold:           Not applicable (>20    Abdominal Wall:         Previously seen                         wks GA)  Face:                  Orbits and profile     Cord Vessels:  Previously seen                         previously seen  Lips:                  Appears normal         Kidneys:                Appear normal  Palate:                Not well visualized    Bladder:                Appears normal  Thoracic:              Appears normal         Spine:                  Previously seen  Heart:                 Previously seen        Upper Extremities:      Previously seen  RVOT:                  Previously seen        Lower Extremities:      Previously seen  Other:  Heels, feet, and lenses previously visualized. Technically difficult due          to maternal habitus and fetal position. ---------------------------------------------------------------------- Impression  Patient with pregestational diabetes admitted for control  returned for antenatal testing.  Polyhydramnios is seen again. Antenatal testing is  reassuring. BPP 8/8. ---------------------------------------------------------------------- Recommendations  Delivery at 37 weeks. ----------------------------------------------------------------------                  Noralee Space, MD Electronically Signed Final Report   08/18/2018 08:17 am ----------------------------------------------------------------------   Current scheduled medications . aspirin EC  81 mg Oral Daily  . busPIRone  10 mg Oral BID  . famotidine  20 mg Oral BID  . insulin aspart  0-24 Units Subcutaneous TID PC  . insulin aspart  23 Units Subcutaneous TID AC   . insulin detemir  50 Units Subcutaneous BID  . polyethylene glycol  17 g Oral BID  . prenatal multivitamin  1 tablet Oral Q1200    I have reviewed the patient's current medications.  ASSESSMENT: Principal Problem:   Preexisting diabetes complicating pregnancy, antepartum Active Problems:   Type I diabetes mellitus with complication, uncontrolled (HCC)   Alpha+ thalassemia trait   Polyhydramnios in third trimester, antepartum complication   Hyperglycemia   Poorly controlled diabetes mellitus (HCC)   PLAN: Pt stable *Pregnancy: routine care. PNV. ASA *DM1: Increase meal coverage at 25 tid ac and leave Detemir at 50 bid. Appreciate input from Diabetic Coordinator team.  *Preterm: no issues *Psych: continue buspar *PPx: SCDs, OOB ad lib *FEN/GI: DM diet *Dispo: 37wk IOL per MFM recommendations, already scheduled for 08/20/18 at 0630. Inpatient until then. Continue routine antenatal care.   [name], MD, FACOG Obstetrician & Gynecologist, [facility] for [employer], [facility]

## 2018-08-18 NOTE — Progress Notes (Signed)
Received referral from pt's RN to offer education regarding Advance Directives prior to induction on Wednesday.  I provided education and patient stated she did not want to complete a living will or health care power of attorney but did have concerns regarding who would get custody of her two children if something were to happen to her in labor.  I helped her process her concerns and advised her that the Advance Directives the hospital provides do not cover such circumstances.  I offered emotional support and opportunity to process feelings related to upcoming delivery.  Please page as further needs arise.  Maryanna ShapeAmanda M. Carley Hammedavee Lomax, M.Div. [facility] Chaplain Pager [phone] Office [phone]

## 2018-08-19 ENCOUNTER — Encounter: Payer: Self-pay | Admitting: Family Medicine

## 2018-08-19 ENCOUNTER — Encounter (HOSPITAL_COMMUNITY): Payer: Self-pay | Admitting: *Deleted

## 2018-08-19 LAB — CBC WITH DIFFERENTIAL/PLATELET
BASOS ABS: 0 10*3/uL (ref 0.0–0.1)
Basophils Relative: 0 %
Eosinophils Absolute: 0 10*3/uL (ref 0.0–0.5)
Eosinophils Relative: 0 %
HCT: 39.1 % (ref 36.0–46.0)
Hemoglobin: 12.5 g/dL (ref 12.0–15.0)
Lymphocytes Relative: 53 %
Lymphs Abs: 3 10*3/uL (ref 0.7–4.0)
MCH: 25.1 pg — ABNORMAL LOW (ref 26.0–34.0)
MCHC: 32 g/dL (ref 30.0–36.0)
MCV: 78.5 fL — ABNORMAL LOW (ref 80.0–100.0)
Monocytes Absolute: 0.4 10*3/uL (ref 0.1–1.0)
Monocytes Relative: 6 %
NEUTROS ABS: 2.4 10*3/uL (ref 1.7–7.7)
Neutrophils Relative %: 41 %
Platelets: 191 10*3/uL (ref 150–400)
RBC: 4.98 MIL/uL (ref 3.87–5.11)
RDW: 15.3 % (ref 11.5–15.5)
WBC: 5.8 10*3/uL (ref 4.0–10.5)
nRBC: 0 % (ref 0.0–0.2)

## 2018-08-19 LAB — COMPREHENSIVE METABOLIC PANEL
ALT: 13 U/L (ref 0–44)
AST: 21 U/L (ref 15–41)
Albumin: 2.4 g/dL — ABNORMAL LOW (ref 3.5–5.0)
Alkaline Phosphatase: 73 U/L (ref 38–126)
Anion gap: 8 (ref 5–15)
BUN: 10 mg/dL (ref 6–20)
CO2: 22 mmol/L (ref 22–32)
Calcium: 8.4 mg/dL — ABNORMAL LOW (ref 8.9–10.3)
Chloride: 100 mmol/L (ref 98–111)
Creatinine, Ser: 0.57 mg/dL (ref 0.44–1.00)
GFR calc Af Amer: >60 mL/min (ref >60)
GFR calc non Af Amer: >60 mL/min (ref >60)
Glucose, Bld: 116 mg/dL — ABNORMAL HIGH (ref 70–99)
Potassium: 3.9 mmol/L (ref 3.5–5.1)
SODIUM: 130 mmol/L — AB (ref 135–145)
Total Bilirubin: 0.5 mg/dL (ref 0.3–1.2)
Total Protein: 6.1 g/dL — ABNORMAL LOW (ref 6.5–8.1)

## 2018-08-19 LAB — GLUCOSE, CAPILLARY
GLUCOSE-CAPILLARY: 109 mg/dL — AB (ref 70–99)
Glucose-Capillary: 136 mg/dL — ABNORMAL HIGH (ref 70–99)
Glucose-Capillary: 58 mg/dL — ABNORMAL LOW (ref 70–99)
Glucose-Capillary: 72 mg/dL (ref 70–99)
Glucose-Capillary: 83 mg/dL (ref 70–99)
Glucose-Capillary: 134 mg/dL — ABNORMAL HIGH (ref 70–99)
Glucose-Capillary: 205 mg/dL — ABNORMAL HIGH (ref 70–99)

## 2018-08-19 LAB — TYPE AND SCREEN
ABO/RH(D): A POS
Antibody Screen: NEGATIVE

## 2018-08-19 MED ORDER — INSULIN ASPART 100 UNIT/ML ~~LOC~~ SOLN
30.0000 [IU] | Freq: Three times a day (TID) | SUBCUTANEOUS | Status: DC
  Administered 2018-08-19: 30 [IU] via SUBCUTANEOUS

## 2018-08-19 NOTE — Progress Notes (Signed)
FACULTY PRACTICE ANTEPARTUM PROGRESS NOTE  Christie [name] is a 33 y.o. [phone] at [redacted]w[redacted]d who is admitted for uncontrolled T1DM.  Estimated Date of Delivery: 09/10/18 Fetal presentation is cephalic.  Admitted 08/12/2018  Subjective: No complaints. Looking forward to tomorrow!!! Patient reports normal fetal movement.  She denies uterine contractions, denies bleeding and leaking of fluid per vagina. No complaints.  Vitals:  Blood pressure (!) 114/91, pulse 95, temperature 98 F (36.7 C), temperature source Oral, resp. rate 18, height 5\' 9"  (1.753 m), weight 108.4 kg, last menstrual period 12/04/2017, SpO2 100 %, unknown if currently breastfeeding. Physical Examination: CONSTITUTIONAL: Well-developed, well-nourished female in no acute distress.  HENT:  Normocephalic, atraumatic, External right and left ear normal. Oropharynx is clear and moist EYES: Conjunctivae and EOM are normal. Pupils are equal, round, and reactive to light. No scleral icterus.  NECK: Normal range of motion, supple, no masses. SKIN: Skin is warm and dry. No rash noted. Not diaphoretic. No erythema. No pallor. NEUROLGIC: Alert and oriented to person, place, and time. Normal reflexes, muscle tone coordination. No cranial nerve deficit noted. PSYCHIATRIC: Normal mood and affect. Normal behavior. Normal judgment and thought content. CARDIOVASCULAR: Normal heart rate noted, regular rhythm RESPIRATORY: Effort and breath sounds normal, no problems with respiration noted MUSCULOSKELETAL: Normal range of motion. No edema and no tenderness. ABDOMEN: Soft, nontender, nondistended, gravid. CERVIX: deferred  Fetal monitoring: FHR: 145 bpm, Variability: moderate, Accelerations: Present, Decelerations: Absent  Uterine activity: 1-2 contractions per hour  Results for orders placed or performed during the hospital encounter of 08/12/18 (from the past 48 hour(s))  Glucose, capillary     Status: Abnormal   Collection Time: 08/17/18  10:21 AM  Result Value Ref Range   Glucose-Capillary 126 (H) 70 - 99 mg/dL  Glucose, capillary     Status: Abnormal   Collection Time: 08/17/18  3:45 PM  Result Value Ref Range   Glucose-Capillary 214 (H) 70 - 99 mg/dL   Comment 1 Notify RN   Glucose, capillary     Status: Abnormal   Collection Time: 08/17/18 10:28 PM  Result Value Ref Range   Glucose-Capillary 120 (H) 70 - 99 mg/dL  Glucose, capillary     Status: Abnormal   Collection Time: 08/18/18  7:54 AM  Result Value Ref Range   Glucose-Capillary 131 (H) 70 - 99 mg/dL  Glucose, capillary     Status: Abnormal   Collection Time: 08/18/18 10:12 AM  Result Value Ref Range   Glucose-Capillary 208 (H) 70 - 99 mg/dL  Glucose, capillary     Status: Abnormal   Collection Time: 08/18/18  4:31 PM  Result Value Ref Range   Glucose-Capillary 237 (H) 70 - 99 mg/dL   Comment 1 Notify RN   Glucose, capillary     Status: Abnormal   Collection Time: 08/18/18  7:49 PM  Result Value Ref Range   Glucose-Capillary 119 (H) 70 - 99 mg/dL  Glucose, capillary     Status: None   Collection Time: 08/18/18  9:37 PM  Result Value Ref Range   Glucose-Capillary 86 70 - 99 mg/dL  Glucose, capillary     Status: Abnormal   Collection Time: 08/19/18  1:39 AM  Result Value Ref Range   Glucose-Capillary 136 (H) 70 - 99 mg/dL   Imaging [place] Mfm Fetal Bpp Wo Non Stress  Result Date: 08/18/2018 ----------------------------------------------------------------------  OBSTETRICS REPORT                       (  Signed Final 08/18/2018 08:17 am) ---------------------------------------------------------------------- Patient Info  ID #:       [id number]                          D.O.B.:  July 24, 1985 (32 yrs)  Name:       Christie [name]             Visit Date: 08/18/2018 07:08 am ---------------------------------------------------------------------- Performed By  Performed By:     Hurman Horn          Ref. Address:     [address]                                                             [place], [place]                                                             [address]  Attending:        Noralee Space MD        Location:         [facility]  Referred By:      Tereso Newcomer MD ---------------------------------------------------------------------- Orders   #  Description                          Code         Ordered By   1  [place] MFM OB FOLLOW UP                  15176.16     RAVI SHANKAR   2  [place] MFM FETAL BPP WO NON              07371.06     RAVI [facility]      STRESS  ----------------------------------------------------------------------   #  Order #                    Accession #                 Episode #   1  [id number]                  [id number]   2  [id number]                  [id number]  [id number]  ---------------------------------------------------------------------- Indications   Pre-existing diabetes, type 1, in pregnancy,   O24.013   third trimester (Novolog)   Poor obstetric history: Previous               O09.299   preeclampsia / eclampsia/gestational HTN   Asthma (Mild, Intermittent)                    O99.89 j45.909   Obesity complicating pregnancy, third          O99.213   trimester   [redacted] weeks gestation of pregnancy                Z3A.36  ---------------------------------------------------------------------- Vital Signs                                                 Height:        5'9" ---------------------------------------------------------------------- Fetal Evaluation  Num Of Fetuses:         1  Fetal Heart Rate(bpm):  142  Cardiac Activity:       Observed  Presentation:           Cephalic  Placenta:               Posterior  P. Cord Insertion:      Visualized, central  Amniotic Fluid  AFI FV:      Polyhydramnios  AFI Sum(cm)     %Tile       Largest Pocket(cm)  27.87           > 97        8.33  RUQ(cm)        RLQ(cm)       LUQ(cm)        LLQ(cm)  8.33          7.97          6              5.57 ---------------------------------------------------------------------- Biophysical Evaluation  Amniotic F.V:   Polyhydramnios             F. Tone:        Observed  F. Movement:    Observed                   Score:          8/8  F. Breathing:   Observed ---------------------------------------------------------------------- Biometry  BPD:      88.4  mm     G. Age:  35w 5d         35  %    CI:        74.53   %    70 - 86                                                          FL/HC:      20.6   %    20.8 - 22.6  HC:       325   mm     G. Age:  36w 6d         25  %    HC/AC:  0.85        0.92 - 1.05  AC:      384.4  mm     G. Age:  N/A          > 97  %    FL/BPD:     75.7   %    71 - 87  FL:       66.9  mm     G. Age:  34w 3d          6  %    FL/AC:      17.4   %    20 - 24  Est. FW:    3768  gm      8 lb 5 oz   > 90  % ---------------------------------------------------------------------- OB History  Gravidity:    4         Term:   2  TOP:          1        Living:  2 ---------------------------------------------------------------------- Gestational Age  LMP:           36w 5d        Date:  12/04/17                 EDD:   09/10/18  U/S Today:     35w 5d                                        EDD:   09/17/18  Best:          36w 5d     Det. By:  LMP  (12/04/17)          EDD:   09/10/18 ---------------------------------------------------------------------- Anatomy  Cranium:               Appears normal         LVOT:                   Previously seen  Cavum:                 Previously seen        Aortic Arch:            Previously seen  Ventricles:            Previously seen        Ductal Arch:            Previously seen  Choroid Plexus:        Previously seen        Diaphragm:              Appears normal  Cerebellum:            Previously seen        Stomach:                Appears normal, left                                                                         sided  Posterior Fossa:       Previously  seen        Abdomen:                Appears normal  Nuchal Fold:           Not applicable (>20    Abdominal Wall:         Previously seen                         wks GA)  Face:                  Orbits and profile     Cord Vessels:           Previously seen                         previously seen  Lips:                  Appears normal         Kidneys:                Appear normal  Palate:                Not well visualized    Bladder:                Appears normal  Thoracic:              Appears normal         Spine:                  Previously seen  Heart:                 Previously seen        Upper Extremities:      Previously seen  RVOT:                  Previously seen        Lower Extremities:      Previously seen  Other:  Heels, feet, and lenses previously visualized. Technically difficult due          to maternal habitus and fetal position. ---------------------------------------------------------------------- Impression  Patient with pregestational diabetes admitted for control  returned for antenatal testing.  Polyhydramnios is seen again. Antenatal testing is  reassuring. BPP 8/8. ---------------------------------------------------------------------- Recommendations  Delivery at 37 weeks. ----------------------------------------------------------------------                  Noralee Space, MD Electronically Signed Final Report   08/18/2018 08:17 am ----------------------------------------------------------------------  [place] Mfm Ob Follow Up  Result Date: 08/18/2018 ----------------------------------------------------------------------  OBSTETRICS REPORT                       (Signed Final 08/18/2018 08:17 am) ---------------------------------------------------------------------- Patient Info  ID #:       [id number]                          D.O.B.:  06/24/1986 (32 yrs)  Name:       Christie [name]             Visit Date: 08/18/2018  07:08 am ---------------------------------------------------------------------- Performed By  Performed By:     Hurman Horn          Ref. Address:     [address]  [address]                                                             [place], [place]                                                             [address]  Attending:        Noralee Space MD        Location:         [facility]  Referred By:      Tereso Newcomer MD ---------------------------------------------------------------------- Orders   #  Description                          Code         Ordered By   1  [place] MFM OB FOLLOW UP                  [id number]     RAVI SHANKAR   2  [place] MFM FETAL BPP WO NON              76819.01     RAVI [facility]      STRESS  ----------------------------------------------------------------------   #  Order #                    Accession #                 Episode #   1  [id number]                  [id number]                  [id number]   2  [id number]                  [id number]                  [id number]  ---------------------------------------------------------------------- Indications   Pre-existing diabetes, type 1, in pregnancy,   O24.013   third trimester (Novolog)   Poor obstetric history: Previous               O09.299   preeclampsia / eclampsia/gestational HTN   Asthma (Mild, Intermittent)                    O99.89 j45.909   Obesity complicating pregnancy, third          O99.213   trimester   [redacted] weeks gestation of pregnancy  Z3A.36  ---------------------------------------------------------------------- Vital Signs                                                 Height:        5'9" ---------------------------------------------------------------------- Fetal Evaluation  Num Of Fetuses:         1  Fetal Heart Rate(bpm):  142  Cardiac Activity:       Observed  Presentation:           Cephalic  Placenta:                Posterior  P. Cord Insertion:      Visualized, central  Amniotic Fluid  AFI FV:      Polyhydramnios  AFI Sum(cm)     %Tile       Largest Pocket(cm)  27.87           > 97        8.33  RUQ(cm)       RLQ(cm)       LUQ(cm)        LLQ(cm)  8.33          7.97          6              5.57 ---------------------------------------------------------------------- Biophysical Evaluation  Amniotic F.V:   Polyhydramnios             F. Tone:        Observed  F. Movement:    Observed                   Score:          8/8  F. Breathing:   Observed ---------------------------------------------------------------------- Biometry  BPD:      88.4  mm     G. Age:  35w 5d         35  %    CI:        74.53   %    70 - 86                                                          FL/HC:      20.6   %    20.8 - 22.6  HC:       325   mm     G. Age:  36w 6d         25  %    HC/AC:      0.85        0.92 - 1.05  AC:      384.4  mm     G. Age:  N/A          > 97  %    FL/BPD:     75.7   %    71 - 87  FL:       66.9  mm     G. Age:  34w 3d          6  %    FL/AC:      17.4   %    20 - 24  Est. FW:  3768  gm      8 lb 5 oz   > 90  % ---------------------------------------------------------------------- OB History  Gravidity:    4         Term:   2  TOP:          1        Living:  2 ---------------------------------------------------------------------- Gestational Age  LMP:           36w 5d        Date:  12/04/17                 EDD:   09/10/18  U/S Today:     35w 5d                                        EDD:   09/17/18  Best:          36w 5d     Det. By:  LMP  (12/04/17)          EDD:   09/10/18 ---------------------------------------------------------------------- Anatomy  Cranium:               Appears normal         LVOT:                   Previously seen  Cavum:                 Previously seen        Aortic Arch:            Previously seen  Ventricles:            Previously seen        Ductal Arch:            Previously seen  Choroid  Plexus:        Previously seen        Diaphragm:              Appears normal  Cerebellum:            Previously seen        Stomach:                Appears normal, left                                                                        sided  Posterior Fossa:       Previously seen        Abdomen:                Appears normal  Nuchal Fold:           Not applicable (>20    Abdominal Wall:         Previously seen                         wks GA)  Face:                  Orbits and profile     Cord Vessels:  Previously seen                         previously seen  Lips:                  Appears normal         Kidneys:                Appear normal  Palate:                Not well visualized    Bladder:                Appears normal  Thoracic:              Appears normal         Spine:                  Previously seen  Heart:                 Previously seen        Upper Extremities:      Previously seen  RVOT:                  Previously seen        Lower Extremities:      Previously seen  Other:  Heels, feet, and lenses previously visualized. Technically difficult due          to maternal habitus and fetal position. ---------------------------------------------------------------------- Impression  Patient with pregestational diabetes admitted for control  returned for antenatal testing.  Polyhydramnios is seen again. Antenatal testing is  reassuring. BPP 8/8. ---------------------------------------------------------------------- Recommendations  Delivery at 37 weeks. ----------------------------------------------------------------------                  Noralee Space, MD Electronically Signed Final Report   08/18/2018 08:17 am ----------------------------------------------------------------------   Current scheduled medications . aspirin EC  81 mg Oral Daily  . busPIRone  10 mg Oral BID  . famotidine  20 mg Oral BID  . insulin aspart  0-24 Units Subcutaneous TID PC  . insulin aspart  25 Units  Subcutaneous TID AC  . insulin detemir  50 Units Subcutaneous BID  . polyethylene glycol  17 g Oral BID  . prenatal multivitamin  1 tablet Oral Q1200    I have reviewed the patient's current medications.  ASSESSMENT: Principal Problem:   Preexisting diabetes complicating pregnancy, antepartum Active Problems:   Type I diabetes mellitus with complication, uncontrolled (HCC)   Alpha+ thalassemia trait   Polyhydramnios in third trimester, antepartum complication   Hyperglycemia   Poorly controlled diabetes mellitus (HCC)   PLAN: Pt stable *Pregnancy: routine care. PNV. ASA. IOL tomorrow, transfer orders signed and held for L&D.   *DM1: Increased meal coverage at 30 tid ac and leave Detemir at 50 bid. Will be on Glucostabilizer while on L&D.  Appreciate input from Diabetic Coordinator team.  *Preterm: no issues *Psych: continue buspar *PPx: SCDs, OOB ad lib *FEN/GI: DM diet *Continue routine antenatal care.   [name], MD, FACOG Obstetrician & Gynecologist, [facility] for [employer], [facility]

## 2018-08-19 NOTE — Progress Notes (Signed)
Inpatient Diabetes Program Recommendations  AACE/ADA: New Consensus Statement on Inpatient Glycemic Control (2015)  Target Ranges:  Prepandial:   less than 140 mg/dL      Peak postprandial:   less than 180 mg/dL (1-2 hours)      Critically ill patients:  140 - 180 mg/dL   Lab Results  Component Value Date   GLUCAP 136 (H) 08/19/2018   HGBA1C 12.4 (H) 08/13/2018    Review of Glycemic Control Results for Christie [name], Christie [name] (MRN [id number]) as of 08/19/2018 09:04  Ref. Range 08/18/2018 10:12 08/18/2018 16:31 08/18/2018 19:49 08/18/2018 21:37 08/19/2018 01:39  Glucose-Capillary Latest Ref Range: 70 - 99 mg/dL 364 (H) 680 (H) 321 (H) 86 136 (H)   Diabetes history:Type 1 DM Outpatient Diabetes medications:Medtronic insulin pump Current orders for Inpatient glycemic control:Novolog 25 units TID, Novolog 0-24 units TID, Levemir 50units BID  Inpatient Diabetes Program Recommendations:  Patient continues to have increased post prandials, exceeding inpatient goals of >140 mg/dL. Consider increasing Novolog 30 units TID (assuming that patient is consuming >50% of meal).  Anticipate that patient will require glucostabilizer while in labor.   Thanks, Lujean Rave, MSN, RNC-OB Diabetes Coordinator [phone] (8a-5p)

## 2018-08-19 NOTE — Telephone Encounter (Signed)
Preadmission screen  

## 2018-08-19 NOTE — Progress Notes (Signed)
Pt requested CBG check due to feeling hot and dizzy. CBG 136.

## 2018-08-19 NOTE — Progress Notes (Signed)
Pt off unit for 2 hour PP CBG

## 2018-08-19 NOTE — Progress Notes (Signed)
Hypoglycemic Event  CBG: 58  Treatment: diabetic snack (milk and apple) and cranberry juice given   Symptoms: asymptomatic- cbg checked per 2 hour post prandial guidelines.   Follow-up CBG: Time: 1515 CBG Result: 72  Possible Reasons for Event:    Comments/MD notified: Dr. Macon Large notified, no new orders      Tacy Dura

## 2018-08-20 ENCOUNTER — Inpatient Hospital Stay (HOSPITAL_COMMUNITY): Payer: Medicaid Other | Admitting: Anesthesiology

## 2018-08-20 ENCOUNTER — Inpatient Hospital Stay (HOSPITAL_COMMUNITY)
Admission: RE | Admit: 2018-08-20 | Discharge: 2018-08-20 | Disposition: A | Discharge status: Home / Self Care | Payer: Medicaid Other | Source: Ambulatory Visit | Attending: Family Medicine | Admitting: Family Medicine

## 2018-08-20 ENCOUNTER — Inpatient Hospital Stay (HOSPITAL_COMMUNITY): Admit: 2018-08-20 | Discharge: 2018-08-20 | Disposition: A | Discharge status: Home / Self Care | Payer: Self-pay

## 2018-08-20 LAB — GLUCOSE, CAPILLARY
GLUCOSE-CAPILLARY: 135 mg/dL — AB (ref 70–99)
GLUCOSE-CAPILLARY: 116 mg/dL — AB (ref 70–99)
Glucose-Capillary: 113 mg/dL — ABNORMAL HIGH (ref 70–99)
Glucose-Capillary: 123 mg/dL — ABNORMAL HIGH (ref 70–99)
Glucose-Capillary: 126 mg/dL — ABNORMAL HIGH (ref 70–99)
Glucose-Capillary: 143 mg/dL — ABNORMAL HIGH (ref 70–99)
Glucose-Capillary: 146 mg/dL — ABNORMAL HIGH (ref 70–99)
Glucose-Capillary: 152 mg/dL — ABNORMAL HIGH (ref 70–99)
Glucose-Capillary: 153 mg/dL — ABNORMAL HIGH (ref 70–99)
Glucose-Capillary: 154 mg/dL — ABNORMAL HIGH (ref 70–99)
Glucose-Capillary: 176 mg/dL — ABNORMAL HIGH (ref 70–99)
Glucose-Capillary: 205 mg/dL — ABNORMAL HIGH (ref 70–99)
Glucose-Capillary: 70 mg/dL (ref 70–99)
Glucose-Capillary: 85 mg/dL (ref 70–99)
Glucose-Capillary: 99 mg/dL (ref 70–99)

## 2018-08-20 LAB — CBC
HCT: 37.6 % (ref 36.0–46.0)
Hemoglobin: 11.9 g/dL — ABNORMAL LOW (ref 12.0–15.0)
MCH: 24.6 pg — ABNORMAL LOW (ref 26.0–34.0)
MCHC: 31.6 g/dL (ref 30.0–36.0)
MCV: 77.7 fL — AB (ref 80.0–100.0)
Platelets: 174 10*3/uL (ref 150–400)
RBC: 4.84 MIL/uL (ref 3.87–5.11)
RDW: 15.3 % (ref 11.5–15.5)
WBC: 6.9 10*3/uL (ref 4.0–10.5)
nRBC: 0 % (ref 0.0–0.2)

## 2018-08-20 LAB — RPR: RPR Ser Ql: NONREACTIVE

## 2018-08-20 MED ORDER — INSULIN DETEMIR 100 UNIT/ML ~~LOC~~ SOLN: 25.0000 [IU] | Freq: Every day | SUBCUTANEOUS | Status: DC

## 2018-08-20 MED ORDER — MISOPROSTOL 25 MCG QUARTER TABLET
25.0000 ug | ORAL_TABLET | ORAL | Status: DC | PRN
  Administered 2018-08-20 (×2): 25 ug via VAGINAL
  Filled 2018-08-20 (×3): qty 1

## 2018-08-20 MED ORDER — ZOLPIDEM TARTRATE 5 MG PO TABS: 5.0000 mg | ORAL_TABLET | Freq: Every evening | ORAL | Status: DC | PRN

## 2018-08-20 MED ORDER — TERBUTALINE SULFATE 1 MG/ML IJ SOLN
INTRAMUSCULAR | Status: AC
  Administered 2018-08-20: 0.25 mg via SUBCUTANEOUS
  Filled 2018-08-20: qty 1

## 2018-08-20 MED ORDER — DIPHENHYDRAMINE HCL 50 MG/ML IJ SOLN: 12.5000 mg | INTRAMUSCULAR | Status: DC | PRN

## 2018-08-20 MED ORDER — OXYCODONE-ACETAMINOPHEN 5-325 MG PO TABS: 1.0000 | ORAL_TABLET | ORAL | Status: DC | PRN

## 2018-08-20 MED ORDER — SOD CITRATE-CITRIC ACID 500-334 MG/5ML PO SOLN
30.0000 mL | ORAL | Status: DC | PRN
  Administered 2018-08-21: 30 mL via ORAL
  Filled 2018-08-20: qty 15

## 2018-08-20 MED ORDER — LACTATED RINGERS IV SOLN
INTRAVENOUS | Status: DC
  Administered 2018-08-20 – 2018-08-21 (×5): via INTRAVENOUS

## 2018-08-20 MED ORDER — TERBUTALINE SULFATE 1 MG/ML IJ SOLN
0.2500 mg | Freq: Once | INTRAMUSCULAR | Status: AC | PRN
  Administered 2018-08-20: 0.25 mg via SUBCUTANEOUS
  Filled 2018-08-20: qty 1

## 2018-08-20 MED ORDER — ACETAMINOPHEN 325 MG PO TABS: 650.0000 mg | ORAL_TABLET | ORAL | Status: DC | PRN

## 2018-08-20 MED ORDER — DEXTROSE IN LACTATED RINGERS 5 % IV SOLN
INTRAVENOUS | Status: DC
  Administered 2018-08-20 – 2018-08-21 (×3): via INTRAVENOUS

## 2018-08-20 MED ORDER — PENICILLIN G 3 MILLION UNITS IVPB - SIMPLE MED
3.0000 10*6.[IU] | INTRAVENOUS | Status: DC
  Administered 2018-08-20 – 2018-08-21 (×6): 3 10*6.[IU] via INTRAVENOUS
  Filled 2018-08-20 (×5): qty 100

## 2018-08-20 MED ORDER — PHENYLEPHRINE 40 MCG/ML (10ML) SYRINGE FOR IV PUSH (FOR BLOOD PRESSURE SUPPORT)
80.0000 ug | PREFILLED_SYRINGE | INTRAVENOUS | Status: DC | PRN
  Filled 2018-08-20: qty 10

## 2018-08-20 MED ORDER — FLEET ENEMA 7-19 GM/118ML RE ENEM: 1.0000 | ENEMA | Freq: Every day | RECTAL | Status: DC | PRN

## 2018-08-20 MED ORDER — OXYTOCIN 40 UNITS IN NORMAL SALINE INFUSION - SIMPLE MED: 1.0000 m[IU]/min | INTRAVENOUS | Status: DC

## 2018-08-20 MED ORDER — OXYTOCIN BOLUS FROM INFUSION: 500.0000 mL | Freq: Once | INTRAVENOUS | Status: DC

## 2018-08-20 MED ORDER — PHENYLEPHRINE 40 MCG/ML (10ML) SYRINGE FOR IV PUSH (FOR BLOOD PRESSURE SUPPORT): 80.0000 ug | PREFILLED_SYRINGE | INTRAVENOUS | Status: DC | PRN

## 2018-08-20 MED ORDER — TERBUTALINE SULFATE 1 MG/ML IJ SOLN
0.2500 mg | Freq: Once | INTRAMUSCULAR | Status: AC | PRN
  Administered 2018-08-21: 0.25 mg via SUBCUTANEOUS
  Filled 2018-08-20: qty 1

## 2018-08-20 MED ORDER — LIDOCAINE HCL (PF) 1 % IJ SOLN
INTRAMUSCULAR | Status: DC | PRN
  Administered 2018-08-20: 6 mL via EPIDURAL

## 2018-08-20 MED ORDER — FENTANYL CITRATE (PF) 100 MCG/2ML IJ SOLN: 50.0000 ug | INTRAMUSCULAR | Status: DC | PRN

## 2018-08-20 MED ORDER — OXYCODONE-ACETAMINOPHEN 5-325 MG PO TABS: 2.0000 | ORAL_TABLET | ORAL | Status: DC | PRN

## 2018-08-20 MED ORDER — OXYTOCIN 40 UNITS IN NORMAL SALINE INFUSION - SIMPLE MED
1.0000 m[IU]/min | INTRAVENOUS | Status: DC
  Administered 2018-08-21 (×2): 2 m[IU]/min via INTRAVENOUS

## 2018-08-20 MED ORDER — LIDOCAINE HCL (PF) 1 % IJ SOLN: 30.0000 mL | INTRAMUSCULAR | Status: DC | PRN

## 2018-08-20 MED ORDER — FENTANYL 2.5 MCG/ML BUPIVACAINE 1/10 % EPIDURAL INFUSION (WH - ANES)
14.0000 mL/h | INTRAMUSCULAR | Status: DC | PRN
  Administered 2018-08-20: 4 mL/h via EPIDURAL
  Filled 2018-08-20 (×2): qty 100

## 2018-08-20 MED ORDER — EPHEDRINE 5 MG/ML INJ: 10.0000 mg | INTRAVENOUS | Status: DC | PRN

## 2018-08-20 MED ORDER — LACTATED RINGERS IV SOLN
500.0000 mL | INTRAVENOUS | Status: DC | PRN
  Administered 2018-08-20 – 2018-08-21 (×5): 500 mL via INTRAVENOUS

## 2018-08-20 MED ORDER — LACTATED RINGERS IV SOLN: 500.0000 mL | Freq: Once | INTRAVENOUS | Status: DC

## 2018-08-20 MED ORDER — TERBUTALINE SULFATE 1 MG/ML IJ SOLN
0.2500 mg | Freq: Once | INTRAMUSCULAR | Status: AC
  Administered 2018-08-20: 0.25 mg via SUBCUTANEOUS

## 2018-08-20 MED ORDER — ONDANSETRON HCL 4 MG/2ML IJ SOLN
4.0000 mg | Freq: Four times a day (QID) | INTRAMUSCULAR | Status: DC | PRN
  Administered 2018-08-21: 4 mg via INTRAVENOUS
  Filled 2018-08-20: qty 2

## 2018-08-20 MED ORDER — HYDROXYZINE HCL 50 MG PO TABS: 50.0000 mg | ORAL_TABLET | Freq: Four times a day (QID) | ORAL | Status: DC | PRN

## 2018-08-20 MED ORDER — SODIUM CHLORIDE 0.9 % IV SOLN
5.0000 10*6.[IU] | Freq: Once | INTRAVENOUS | Status: AC
  Administered 2018-08-20: 5 10*6.[IU] via INTRAVENOUS
  Filled 2018-08-20: qty 5

## 2018-08-20 MED ORDER — FENTANYL CITRATE (PF) 100 MCG/2ML IJ SOLN
100.0000 ug | INTRAMUSCULAR | Status: DC | PRN
  Administered 2018-08-20 (×2): 100 ug via INTRAVENOUS
  Filled 2018-08-20 (×2): qty 2

## 2018-08-20 MED ORDER — OXYTOCIN 40 UNITS IN NORMAL SALINE INFUSION - SIMPLE MED
2.5000 [IU]/h | INTRAVENOUS | Status: DC
  Filled 2018-08-20: qty 1000

## 2018-08-20 MED ORDER — INSULIN REGULAR(HUMAN) IN NACL 100-0.9 UT/100ML-% IV SOLN
INTRAVENOUS | Status: DC
  Administered 2018-08-20: 0.8 [IU]/h via INTRAVENOUS
  Administered 2018-08-21: 0.6 [IU]/h via INTRAVENOUS
  Filled 2018-08-20 (×2): qty 100

## 2018-08-20 NOTE — Progress Notes (Signed)
Patient ID: [name], female   DOB: 1985/09/27, 33 y.o.   MRN: [id number]  Patient transferred to L&D for induction of labor for uncontrolled diabetes. Cervical exam done: 0.5/thick/ballotable. Cytotec 25mcg given Insulin - glucostabilizer when necessary GBS - PCN  Desires PP BTL following delivery.  Levie HeritageJacob J Stinson, DO 08/20/2018 7:23 AM'

## 2018-08-20 NOTE — Anesthesia Procedure Notes (Signed)
Epidural Patient location during procedure: OB Start time: 08/20/2018 10:31 PM End time: 08/20/2018 10:35 PM  Staffing Anesthesiologist: Bethena Midget, MD  Preanesthetic Checklist Completed: patient identified, site marked, surgical consent, pre-op evaluation, timeout performed, IV checked, risks and benefits discussed and monitors and equipment checked  Epidural Patient position: sitting Prep: site prepped and draped and DuraPrep Patient monitoring: continuous pulse ox and blood pressure Approach: midline Location: L3-L4 Injection technique: LOR air  Needle:  Needle type: Tuohy  Needle gauge: 17 G Needle length: 9 cm and 9 Needle insertion depth: 8 cm Catheter type: closed end flexible Catheter size: 19 Gauge Catheter at skin depth: 13 cm Test dose: negative  Assessment Events: blood not aspirated, injection not painful, no injection resistance, negative IV test and no paresthesia

## 2018-08-20 NOTE — Anesthesia Preprocedure Evaluation (Addendum)
Anesthesia Evaluation  Patient identified by MRN, date of birth, ID band Patient awake    Reviewed: Allergy & Precautions, H&P , NPO status , Patient's Chart, lab work & pertinent test results, reviewed documented beta blocker date and time   History of Anesthesia Complications Negative for: history of anesthetic complications  Airway Mallampati: II  TM Distance: >3 FB Neck ROM: full    Dental no notable dental hx.    Pulmonary asthma ,    Pulmonary exam normal breath sounds clear to auscultation       Cardiovascular Exercise Tolerance: Good hypertension, Normal cardiovascular exam Rhythm:regular Rate:Normal     Neuro/Psych  Headaches, Anxiety Depression Bipolar Disorder negative psych ROS   GI/Hepatic negative GI ROS, Neg liver ROS,   Endo/Other  diabetes, Type 1, Insulin Dependent  Renal/GU negative Renal ROS  negative genitourinary   Musculoskeletal   Abdominal (+) + obese,   Peds  Hematology negative hematology ROS (+)   Anesthesia Other Findings   Reproductive/Obstetrics negative OB ROS                            Anesthesia Physical Anesthesia Plan  ASA: III and emergent  Anesthesia Plan: Epidural   Post-op Pain Management:    Induction:   PONV Risk Score and Plan: 2 and Treatment may vary due to age or medical condition, Ondansetron and Dexamethasone  Airway Management Planned:   Additional Equipment:   Intra-op Plan:   Post-operative Plan:   Informed Consent: I have reviewed the patients History and Physical, chart, labs and discussed the procedure including the risks, benefits and alternatives for the proposed anesthesia with the patient or authorized representative who has indicated his/her understanding and acceptance.     Dental advisory given  Plan Discussed with: CRNA and Anesthesiologist  Anesthesia Plan Comments: (Labs checked- platelets confirmed with RN  in room. Fetal heart tracing, per RN, reported to be stable enough for sitting procedure. Discussed epidural, and patient consents to the procedure:  included risk of possible headache,backache, failed block, allergic reaction, and nerve injury. This patient was asked if she had any questions or concerns before the procedure started.  Epidural used for C/S for NRFHT.)       Anesthesia Quick Evaluation

## 2018-08-20 NOTE — Progress Notes (Signed)
OB/[facility]N Faculty [facility]: Labor Progress Note  Subjective: Doing well, feeling much more uncomfortable with contractions for the last 1 hour. Glucose stabilizer still running.   Objective: BP 118/78   Pulse 85   Temp 98.6 F (37 C) (Oral)   Resp 18   Ht 5\' 9"  (1.753 m)   Wt 108.4 kg   LMP 12/04/2017 (Exact Date)   SpO2 96%   BMI 35.29 kg/m  Gen: sitting in chair, breathing through contractions  Dilation: 1 Effacement (%): Thick Station: -3 Presentation: Vertex Exam by:: Dr. Earlene [name]  Assessment and Plan: 33 y.o. [id number] [redacted]w[redacted]d here for IOL for poorly controlled Type I DM.   Labor: Much more uncomfortable but cervical exam unchanged, will place FB. Contracting too frequently for any more cytotec at this time, will continue to monitor and consider cytotec versus transitioning to pitocin.  -- pain control: ultimately desires epidural -- PPH Risk: medium  Type I DM: Most recent A1C 12.0%. Recent sugars 200>170s>120s -- glucose stabilizer with D5 fluids   Fetal Well-Being: EFW 3768g, >90% at 36w5. Cephalic by sutures.  -- Category I - continuous fetal monitoring  -- GBS positive -  PCN  Christie Lorah S. Earlene Plater, DO OB/GYN Fellow, Faculty Practice  4:24 PM

## 2018-08-20 NOTE — Progress Notes (Signed)
LABOR PROGRESS NOTE  Christie [name] is a 33 y.o. [phone] at [redacted]w[redacted]d  admitted for IOL for uncontrolled T1DM.  Subjective: Christie [name] was tearful on exam and in significant pain. Christie [name] was not tolerating the FB well at all and asked it be removed. Family members state Christie [name] has been experiencing this severe pain for a couple of hours and it is not being controlled with the IV fentanyl. Christie [name] is having regular contractions. Glucose stabilizer running.  Objective: BP 128/84   Pulse 98   Temp 98 F (36.7 C) (Oral)   Resp 20   Ht 5\' 9"  (1.753 m)   Wt 108.4 kg   LMP 12/04/2017 (Exact Date)   SpO2 96%   BMI 35.29 kg/m  or  Vitals:   08/20/18 1225 08/20/18 1726 08/20/18 1956 08/20/18 2000  BP:  110/62  128/84  Pulse:  79  98  Resp: 18 18 20    Temp:  98 F (36.7 C) 98 F (36.7 C)   TempSrc:  Oral Oral   SpO2:      Weight:      Height:        Dilation: 1 Effacement (%): Thick Station: -3 Presentation: Vertex Exam by:: Dr. Aggie Hacker: baseline rate 150, moderate varibility, 10x10 acel, no decel Toco: ctx q 2-3 min  Labs: Lab Results  Component Value Date   WBC 5.8 08/19/2018   HGB 12.5 08/19/2018   HCT 39.1 08/19/2018   MCV 78.5 (L) 08/19/2018   PLT 191 08/19/2018    Patient Active Problem List   Diagnosis Date Noted  . Poorly controlled diabetes mellitus (HCC) 08/18/2018  . Anxiety 08/12/2018  . Hyperglycemia 08/12/2018  . Polyhydramnios in third trimester, antepartum complication 07/21/2018  . Type 1 diabetes mellitus in pregnancy 07/12/2018  . DKA (diabetic ketoacidoses) (HCC) 07/11/2018  . 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 Preexisting diabetes complicating pregnancy, antepartum 02/11/2018  . Supervision of high-risk pregnancy 02/11/2018  . History of loop electrical excision procedure (LEEP) 09/23/2017  . Back pain 04/27/2016  . Abnormal Papanicolaou smear of cervix with positive human papilloma virus (HPV) test 09/02/2015  . Adjustment disorder with  depressed mood 08/03/2015  . Asthma, mild intermittent 07/06/2013  . Type I diabetes mellitus with complication, uncontrolled (HCC) 06/18/2011  . History of migraine during pregnancy 06/18/2011  . Migraine 06/18/2011    Assessment / Plan: 33 y.o. [id number] at [redacted]w[redacted]d here for IOL for uncontrolled T1DM  Labor: latent, FB placed at 1530 was removed due to Christie [name] intolerance, s/p cytotec x2, will start pitocin Fetal Wellbeing:  Cat  Pain Control:  Plans for epidural Anticipated MOD:  SVD  Xan Lekia Nier, D.O. 08/20/2018, 9:32 PM

## 2018-08-20 NOTE — Progress Notes (Signed)
OB/GYN Faculty Practice: Labor Progress Note  Subjective: Doing well, NAD. Glucose stabilizer has been started for elevated BG.   Objective: BP 118/78   Pulse 85   Temp 98.6 F (37 C) (Oral)   Resp 18   Ht 5\' 9"  (1.753 m)   Wt 108.4 kg   LMP 12/04/2017 (Exact Date)   SpO2 96%   BMI 35.29 kg/m  Gen: well-appearing, sitting up in bed, NAD Dilation: 1 Effacement (%): Thick Station: -3 Presentation: Vertex Exam by:: Dr. Earlene Plater  Assessment and Plan: 33 y.o. [id number] [redacted]w[redacted]d here for IOL for poorly controlled Type I DM.   Labor: IOL started this morning with cytotec. Rechecked and progressed from FT to 1, membranes stripped. Patient quite resistant to FB placement at this time. Discussed rechecking in 4 hours and then switching to buccal cytotec and reconsidering FB at that time.  -- pain control: ultimately desires epidural -- PPH Risk: medium  Type I DM: Most recent A1C 12.0%. Most recent CBG 130s.  -- glucose stabilizer with D5 fluids   Fetal Well-Being: EFW 3768g, >90% at 36w5. Cephalic by sutures.  -- Category I - continuous fetal monitoring  -- GBS positive -  PCN  Marjani Kobel S. Earlene Plater, DO OB/GYN Fellow, Faculty Practice  12:30 PM

## 2018-08-20 NOTE — Anesthesia Pain Management Evaluation Note (Signed)
  CRNA Pain Management Visit Note  Patient: Christie [name], 33 y.o., female  "Hello I am a member of the anesthesia team at [facility]. We have an anesthesia team available at all times to provide care throughout the hospital, including epidural management and anesthesia for C-section. I don't know your plan for the delivery whether it a natural birth, water birth, IV sedation, nitrous supplementation, doula or epidural, but we want to meet your pain goals."   1.Was your pain managed to your expectations on prior hospitalizations?   Yes   2.What is your expectation for pain management during this hospitalization?     Epidural  3.How can we help you reach that goal? UNSURE  Record the patient's initial score and the patient's pain goal.   Pain: 3  Pain Goal: 7 [facility] wants you to be able to say your pain was always managed very well.  Cephus Shelling 08/20/2018

## 2018-08-21 ENCOUNTER — Encounter (HOSPITAL_COMMUNITY): Payer: Self-pay | Admitting: *Deleted

## 2018-08-21 ENCOUNTER — Encounter (HOSPITAL_COMMUNITY)
Admission: AD | Disposition: A | Discharge status: Home / Self Care | Payer: Self-pay | Source: Home / Self Care | Attending: Obstetrics and Gynecology

## 2018-08-21 DIAGNOSIS — Z794 Long term (current) use of insulin: Secondary | ICD-10-CM

## 2018-08-21 DIAGNOSIS — Z9641 Presence of insulin pump (external) (internal): Secondary | ICD-10-CM

## 2018-08-21 DIAGNOSIS — Z302 Encounter for sterilization: Secondary | ICD-10-CM

## 2018-08-21 DIAGNOSIS — Z3A37 37 weeks gestation of pregnancy: Secondary | ICD-10-CM

## 2018-08-21 DIAGNOSIS — O403XX0 Polyhydramnios, third trimester, not applicable or unspecified: Secondary | ICD-10-CM

## 2018-08-21 DIAGNOSIS — O2402 Pre-existing diabetes mellitus, type 1, in childbirth: Secondary | ICD-10-CM | POA: Diagnosis not present

## 2018-08-21 LAB — GLUCOSE, CAPILLARY
GLUCOSE-CAPILLARY: 117 mg/dL — AB (ref 70–99)
GLUCOSE-CAPILLARY: 75 mg/dL (ref 70–99)
GLUCOSE-CAPILLARY: 92 mg/dL (ref 70–99)
Glucose-Capillary: 126 mg/dL — ABNORMAL HIGH (ref 70–99)
Glucose-Capillary: 93 mg/dL (ref 70–99)
Glucose-Capillary: 83 mg/dL (ref 70–99)
Glucose-Capillary: 109 mg/dL — ABNORMAL HIGH (ref 70–99)
Glucose-Capillary: 89 mg/dL (ref 70–99)
Glucose-Capillary: 78 mg/dL (ref 70–99)
Glucose-Capillary: 107 mg/dL — ABNORMAL HIGH (ref 70–99)
Glucose-Capillary: 174 mg/dL — ABNORMAL HIGH (ref 70–99)
Glucose-Capillary: 216 mg/dL — ABNORMAL HIGH (ref 70–99)
Glucose-Capillary: 222 mg/dL — ABNORMAL HIGH (ref 70–99)

## 2018-08-21 SURGERY — Surgical Case
Anesthesia: Epidural | Site: Abdomen | Laterality: Bilateral | Wound class: Clean Contaminated

## 2018-08-21 MED ORDER — ACETAMINOPHEN 500 MG PO TABS
1000.0000 mg | ORAL_TABLET | Freq: Four times a day (QID) | ORAL | Status: AC
  Administered 2018-08-21 – 2018-08-22 (×3): 1000 mg via ORAL
  Filled 2018-08-21 (×3): qty 2

## 2018-08-21 MED ORDER — SODIUM CHLORIDE 0.9 % IV SOLN
500.0000 mg | Freq: Once | INTRAVENOUS | Status: AC
  Administered 2018-08-21: 500 mg via INTRAVENOUS
  Filled 2018-08-21: qty 500

## 2018-08-21 MED ORDER — KETOROLAC TROMETHAMINE 30 MG/ML IJ SOLN
INTRAMUSCULAR | Status: AC
  Filled 2018-08-21: qty 1

## 2018-08-21 MED ORDER — SIMETHICONE 80 MG PO CHEW
80.0000 mg | CHEWABLE_TABLET | Freq: Three times a day (TID) | ORAL | Status: DC
  Administered 2018-08-21 – 2018-08-24 (×6): 80 mg via ORAL
  Filled 2018-08-21 (×6): qty 1

## 2018-08-21 MED ORDER — SCOPOLAMINE 1 MG/3DAYS TD PT72
MEDICATED_PATCH | TRANSDERMAL | Status: AC
  Filled 2018-08-21: qty 1

## 2018-08-21 MED ORDER — FENTANYL CITRATE (PF) 100 MCG/2ML IJ SOLN
INTRAMUSCULAR | Status: AC
  Filled 2018-08-21: qty 2

## 2018-08-21 MED ORDER — NALOXONE HCL 0.4 MG/ML IJ SOLN: 0.4000 mg | INTRAMUSCULAR | Status: DC | PRN

## 2018-08-21 MED ORDER — NALBUPHINE HCL 10 MG/ML IJ SOLN
5.0000 mg | INTRAMUSCULAR | Status: DC | PRN
  Filled 2018-08-21: qty 1

## 2018-08-21 MED ORDER — DIPHENHYDRAMINE HCL 50 MG/ML IJ SOLN: 12.5000 mg | INTRAMUSCULAR | Status: DC | PRN

## 2018-08-21 MED ORDER — OXYCODONE HCL 5 MG PO TABS
5.0000 mg | ORAL_TABLET | ORAL | Status: DC | PRN
  Administered 2018-08-22: 5 mg via ORAL
  Administered 2018-08-22: 10 mg via ORAL
  Administered 2018-08-22: 5 mg via ORAL
  Administered 2018-08-23 – 2018-08-24 (×7): 10 mg via ORAL
  Filled 2018-08-21: qty 1
  Filled 2018-08-21 (×4): qty 2
  Filled 2018-08-21: qty 1
  Filled 2018-08-21 (×4): qty 2

## 2018-08-21 MED ORDER — TETANUS-DIPHTH-ACELL PERTUSSIS 5-2.5-18.5 LF-MCG/0.5 IM SUSP: 0.5000 mL | Freq: Once | INTRAMUSCULAR | Status: DC

## 2018-08-21 MED ORDER — STERILE WATER FOR IRRIGATION IR SOLN
Status: DC | PRN
  Administered 2018-08-21: 1000 mL

## 2018-08-21 MED ORDER — INSULIN GLARGINE 100 UNIT/ML ~~LOC~~ SOLN
20.0000 [IU] | Freq: Every day | SUBCUTANEOUS | Status: DC
  Administered 2018-08-21: 20 [IU] via SUBCUTANEOUS
  Filled 2018-08-21: qty 0.2

## 2018-08-21 MED ORDER — SODIUM CHLORIDE 0.9% FLUSH: 3.0000 mL | INTRAVENOUS | Status: DC | PRN

## 2018-08-21 MED ORDER — SIMETHICONE 80 MG PO CHEW: 80.0000 mg | CHEWABLE_TABLET | ORAL | Status: DC | PRN

## 2018-08-21 MED ORDER — CHLOROPROCAINE HCL (PF) 3 % IJ SOLN
INTRAMUSCULAR | Status: DC | PRN
  Administered 2018-08-21: 20 mL

## 2018-08-21 MED ORDER — LIDOCAINE-EPINEPHRINE (PF) 2 %-1:200000 IJ SOLN
INTRAMUSCULAR | Status: AC
  Filled 2018-08-21: qty 20

## 2018-08-21 MED ORDER — NALOXONE HCL 4 MG/10ML IJ SOLN
1.0000 ug/kg/h | INTRAVENOUS | Status: DC | PRN
  Filled 2018-08-21: qty 5

## 2018-08-21 MED ORDER — FENTANYL CITRATE (PF) 100 MCG/2ML IJ SOLN
25.0000 ug | INTRAMUSCULAR | Status: DC | PRN
  Administered 2018-08-21: 25 ug via INTRAVENOUS

## 2018-08-21 MED ORDER — ONDANSETRON HCL 4 MG/2ML IJ SOLN
4.0000 mg | Freq: Three times a day (TID) | INTRAMUSCULAR | Status: DC | PRN
  Administered 2018-08-21: 4 mg via INTRAVENOUS
  Filled 2018-08-21: qty 2

## 2018-08-21 MED ORDER — PHENYLEPHRINE 40 MCG/ML (10ML) SYRINGE FOR IV PUSH (FOR BLOOD PRESSURE SUPPORT)
PREFILLED_SYRINGE | INTRAVENOUS | Status: AC
  Filled 2018-08-21: qty 10

## 2018-08-21 MED ORDER — DEXAMETHASONE SODIUM PHOSPHATE 4 MG/ML IJ SOLN
INTRAMUSCULAR | Status: AC
  Filled 2018-08-21: qty 1

## 2018-08-21 MED ORDER — CEFAZOLIN SODIUM-DEXTROSE 2-4 GM/100ML-% IV SOLN
2.0000 g | Freq: Once | INTRAVENOUS | Status: AC
  Administered 2018-08-21: 2 g via INTRAVENOUS

## 2018-08-21 MED ORDER — FENTANYL CITRATE (PF) 100 MCG/2ML IJ SOLN
INTRAMUSCULAR | Status: DC | PRN
  Administered 2018-08-21: 50 ug via INTRAVENOUS
  Administered 2018-08-21: 100 ug via EPIDURAL

## 2018-08-21 MED ORDER — MEPERIDINE HCL 25 MG/ML IJ SOLN
INTRAMUSCULAR | Status: AC
  Filled 2018-08-21: qty 1

## 2018-08-21 MED ORDER — CHLOROPROCAINE HCL (PF) 3 % IJ SOLN
INTRAMUSCULAR | Status: AC
  Filled 2018-08-21: qty 20

## 2018-08-21 MED ORDER — INSULIN ASPART 100 UNIT/ML ~~LOC~~ SOLN
0.0000 [IU] | Freq: Three times a day (TID) | SUBCUTANEOUS | Status: DC
  Administered 2018-08-21: 5 [IU] via SUBCUTANEOUS
  Administered 2018-08-22 – 2018-08-24 (×4): 2 [IU] via SUBCUTANEOUS
  Filled 2018-08-21 (×4): qty 1

## 2018-08-21 MED ORDER — OXYTOCIN 10 UNIT/ML IJ SOLN
INTRAMUSCULAR | Status: AC
  Filled 2018-08-21: qty 4

## 2018-08-21 MED ORDER — COCONUT OIL OIL: 1.0000 "application " | TOPICAL_OIL | Status: DC | PRN

## 2018-08-21 MED ORDER — SIMETHICONE 80 MG PO CHEW
80.0000 mg | CHEWABLE_TABLET | ORAL | Status: DC
  Administered 2018-08-22 – 2018-08-24 (×3): 80 mg via ORAL
  Filled 2018-08-21 (×3): qty 1

## 2018-08-21 MED ORDER — DIPHENHYDRAMINE HCL 25 MG PO CAPS
25.0000 mg | ORAL_CAPSULE | ORAL | Status: DC | PRN
  Filled 2018-08-21: qty 1

## 2018-08-21 MED ORDER — SODIUM CHLORIDE 0.9 % IR SOLN
Status: DC | PRN
  Administered 2018-08-21: 1000 mL

## 2018-08-21 MED ORDER — NALBUPHINE HCL 10 MG/ML IJ SOLN: 5.0000 mg | Freq: Once | INTRAMUSCULAR | Status: DC | PRN

## 2018-08-21 MED ORDER — MORPHINE SULFATE (PF) 0.5 MG/ML IJ SOLN
INTRAMUSCULAR | Status: DC | PRN
  Administered 2018-08-21: 2000 ug via EPIDURAL

## 2018-08-21 MED ORDER — ONDANSETRON HCL 4 MG/2ML IJ SOLN
INTRAMUSCULAR | Status: AC
  Filled 2018-08-21: qty 2

## 2018-08-21 MED ORDER — NALBUPHINE HCL 10 MG/ML IJ SOLN
5.0000 mg | INTRAMUSCULAR | Status: DC | PRN
  Administered 2018-08-21: 5 mg via INTRAVENOUS
  Administered 2018-08-22: 0.5 mg via INTRAVENOUS
  Filled 2018-08-21: qty 1

## 2018-08-21 MED ORDER — ZOLPIDEM TARTRATE 5 MG PO TABS: 5.0000 mg | ORAL_TABLET | Freq: Every evening | ORAL | Status: DC | PRN

## 2018-08-21 MED ORDER — KETOROLAC TROMETHAMINE 30 MG/ML IJ SOLN: 30.0000 mg | Freq: Four times a day (QID) | INTRAMUSCULAR | Status: AC | PRN

## 2018-08-21 MED ORDER — OXYTOCIN 40 UNITS IN NORMAL SALINE INFUSION - SIMPLE MED
INTRAVENOUS | Status: DC | PRN
  Administered 2018-08-21: 40 [IU] via INTRAVENOUS

## 2018-08-21 MED ORDER — ENOXAPARIN SODIUM 60 MG/0.6ML ~~LOC~~ SOLN
0.5000 mg/kg | SUBCUTANEOUS | Status: DC
  Administered 2018-08-22 – 2018-08-24 (×3): 55 mg via SUBCUTANEOUS
  Filled 2018-08-21 (×3): qty 0.6

## 2018-08-21 MED ORDER — MENTHOL 3 MG MT LOZG: 1.0000 | LOZENGE | OROMUCOSAL | Status: DC | PRN

## 2018-08-21 MED ORDER — LACTATED RINGERS IV SOLN
INTRAVENOUS | Status: DC | PRN
  Administered 2018-08-21: 10:00:00 via INTRAVENOUS

## 2018-08-21 MED ORDER — METOCLOPRAMIDE HCL 5 MG/ML IJ SOLN
INTRAMUSCULAR | Status: DC | PRN
  Administered 2018-08-21: 10 mg via INTRAVENOUS

## 2018-08-21 MED ORDER — SENNOSIDES-DOCUSATE SODIUM 8.6-50 MG PO TABS
2.0000 | ORAL_TABLET | ORAL | Status: DC
  Administered 2018-08-22 – 2018-08-24 (×3): 2 via ORAL
  Filled 2018-08-21 (×3): qty 2

## 2018-08-21 MED ORDER — MORPHINE SULFATE (PF) 0.5 MG/ML IJ SOLN
INTRAMUSCULAR | Status: AC
  Filled 2018-08-21: qty 10

## 2018-08-21 MED ORDER — PRENATAL MULTIVITAMIN CH
1.0000 | ORAL_TABLET | Freq: Every day | ORAL | Status: DC
  Administered 2018-08-22 – 2018-08-23 (×2): 1 via ORAL
  Filled 2018-08-21 (×2): qty 1

## 2018-08-21 MED ORDER — WITCH HAZEL-GLYCERIN EX PADS: 1.0000 "application " | MEDICATED_PAD | CUTANEOUS | Status: DC | PRN

## 2018-08-21 MED ORDER — DIBUCAINE 1 % RE OINT: 1.0000 "application " | TOPICAL_OINTMENT | RECTAL | Status: DC | PRN

## 2018-08-21 MED ORDER — LACTATED RINGERS IV SOLN
INTRAVENOUS | Status: DC | PRN
  Administered 2018-08-21 (×2): via INTRAVENOUS

## 2018-08-21 MED ORDER — METOCLOPRAMIDE HCL 5 MG/ML IJ SOLN
INTRAMUSCULAR | Status: AC
  Filled 2018-08-21: qty 2

## 2018-08-21 MED ORDER — OXYTOCIN 40 UNITS IN NORMAL SALINE INFUSION - SIMPLE MED: 2.5000 [IU]/h | INTRAVENOUS | Status: AC

## 2018-08-21 MED ORDER — HYDROMORPHONE HCL 1 MG/ML IJ SOLN: 0.5000 mg | INTRAMUSCULAR | Status: DC | PRN

## 2018-08-21 MED ORDER — LIDOCAINE-EPINEPHRINE (PF) 2 %-1:200000 IJ SOLN
INTRAMUSCULAR | Status: DC | PRN
  Administered 2018-08-21: 4 mL via EPIDURAL
  Administered 2018-08-21: 10 mL via EPIDURAL

## 2018-08-21 MED ORDER — ONDANSETRON HCL 4 MG/2ML IJ SOLN
INTRAMUSCULAR | Status: DC | PRN
  Administered 2018-08-21: 4 mg via INTRAVENOUS

## 2018-08-21 MED ORDER — DIPHENHYDRAMINE HCL 25 MG PO CAPS
25.0000 mg | ORAL_CAPSULE | Freq: Four times a day (QID) | ORAL | Status: DC | PRN
  Administered 2018-08-21: 25 mg via ORAL
  Filled 2018-08-21: qty 1

## 2018-08-21 MED ORDER — BUPIVACAINE HCL (PF) 0.5 % IJ SOLN
INTRAMUSCULAR | Status: AC
  Filled 2018-08-21: qty 30

## 2018-08-21 MED ORDER — KETOROLAC TROMETHAMINE 30 MG/ML IJ SOLN
30.0000 mg | Freq: Four times a day (QID) | INTRAMUSCULAR | Status: AC | PRN
  Administered 2018-08-21: 30 mg via INTRAMUSCULAR

## 2018-08-21 MED ORDER — DEXAMETHASONE SODIUM PHOSPHATE 4 MG/ML IJ SOLN
INTRAMUSCULAR | Status: DC | PRN
  Administered 2018-08-21: 4 mg via INTRAVENOUS

## 2018-08-21 MED ORDER — ACETAMINOPHEN 325 MG PO TABS
650.0000 mg | ORAL_TABLET | ORAL | Status: DC | PRN
  Administered 2018-08-22 – 2018-08-24 (×8): 650 mg via ORAL
  Filled 2018-08-21 (×8): qty 2

## 2018-08-21 MED ORDER — PROMETHAZINE HCL 25 MG/ML IJ SOLN: 6.2500 mg | INTRAMUSCULAR | Status: DC | PRN

## 2018-08-21 MED ORDER — KETOROLAC TROMETHAMINE 30 MG/ML IJ SOLN: 30.0000 mg | Freq: Once | INTRAMUSCULAR | Status: DC | PRN

## 2018-08-21 MED ORDER — SCOPOLAMINE 1 MG/3DAYS TD PT72
MEDICATED_PATCH | TRANSDERMAL | Status: DC | PRN
  Administered 2018-08-21: 1 via TRANSDERMAL

## 2018-08-21 MED ORDER — MEPERIDINE HCL 25 MG/ML IJ SOLN
INTRAMUSCULAR | Status: DC | PRN
  Administered 2018-08-21 (×2): 12.5 mg via INTRAVENOUS

## 2018-08-21 MED ORDER — BUPIVACAINE HCL (PF) 0.5 % IJ SOLN
INTRAMUSCULAR | Status: DC | PRN
  Administered 2018-08-21: 30 mL

## 2018-08-21 SURGICAL SUPPLY — 38 items
APL SKNCLS STERI-STRIP NONHPOA (GAUZE/BANDAGES/DRESSINGS)
BENZOIN TINCTURE PRP APPL 2/3 (GAUZE/BANDAGES/DRESSINGS) IMPLANT
CHLORAPREP W/TINT 26ML (MISCELLANEOUS) ×3 IMPLANT
CLAMP CORD UMBIL (MISCELLANEOUS) ×2 IMPLANT
CLOSURE WOUND 1/2 X4 (GAUZE/BANDAGES/DRESSINGS)
CLOTH BEACON ORANGE TIMEOUT ST (SAFETY) ×3 IMPLANT
DRSG OPSITE POSTOP 4X10 (GAUZE/BANDAGES/DRESSINGS) ×3 IMPLANT
ELECT REM PT RETURN 9FT ADLT (ELECTROSURGICAL) ×3
ELECTRODE REM PT RTRN 9FT ADLT (ELECTROSURGICAL) ×1 IMPLANT
EXTRACTOR VACUUM BELL STYLE (SUCTIONS) IMPLANT
GLOVE BIOGEL PI IND STRL 6.5 (GLOVE) ×1 IMPLANT
GLOVE BIOGEL PI IND STRL 7.0 (GLOVE) ×2 IMPLANT
GLOVE BIOGEL PI INDICATOR 6.5 (GLOVE) ×2
GLOVE BIOGEL PI INDICATOR 7.0 (GLOVE) ×4
GLOVE ORTHOPEDIC STR SZ6.5 (GLOVE) ×3 IMPLANT
GOWN STRL REUS W/TWL LRG LVL3 (GOWN DISPOSABLE) ×9 IMPLANT
HEMOSTAT ARISTA ABSORB 3G PWDR (HEMOSTASIS) ×2 IMPLANT
KIT PREVENA INCISION MGT20CM45 (CANNISTER) ×2 IMPLANT
NDL HYPO 25X1 1.5 SAFETY (NEEDLE) IMPLANT
NEEDLE HYPO 22GX1.5 SAFETY (NEEDLE) ×3 IMPLANT
NEEDLE HYPO 25X1 1.5 SAFETY (NEEDLE) IMPLANT
NS IRRIG 1000ML POUR BTL (IV SOLUTION) ×3 IMPLANT
PACK C SECTION WH (CUSTOM PROCEDURE TRAY) ×3 IMPLANT
PAD OB MATERNITY 4.3X12.25 (PERSONAL CARE ITEMS) ×3 IMPLANT
PENCIL SMOKE EVAC W/HOLSTER (ELECTROSURGICAL) ×3 IMPLANT
STRIP CLOSURE SKIN 1/2X4 (GAUZE/BANDAGES/DRESSINGS) IMPLANT
SUT MON AB 4-0 PS1 27 (SUTURE) ×3 IMPLANT
SUT PDS AB 0 CTX 36 PDP370T (SUTURE) ×2 IMPLANT
SUT PLAIN 2 0 (SUTURE) ×3
SUT PLAIN ABS 2-0 CT1 27XMFL (SUTURE) ×1 IMPLANT
SUT VIC AB 0 CT1 36 (SUTURE) ×10 IMPLANT
SUT VIC AB 0 CTX 36 (SUTURE) ×3
SUT VIC AB 0 CTX36XBRD ANBCTRL (SUTURE) ×1 IMPLANT
SUT VIC AB 4-0 KS 27 (SUTURE) ×2 IMPLANT
SYR CONTROL 10ML LL (SYRINGE) ×3 IMPLANT
TOWEL OR 17X24 6PK STRL BLUE (TOWEL DISPOSABLE) ×3 IMPLANT
TRAY FOLEY W/BAG SLVR 14FR LF (SET/KITS/TRAYS/PACK) ×3 IMPLANT
WATER STERILE IRR 1000ML POUR (IV SOLUTION) ×3 IMPLANT

## 2018-08-21 NOTE — Progress Notes (Signed)
LABOR PROGRESS NOTE  Christie [name] is a 33 y.o. [phone] at [redacted]w[redacted]d  admitted for IOL for uncontrolled T1DM  Subjective: Patient is resting comfortably. Pain well-controlled. No vaginal bleeding.  Objective: BP 117/79   Pulse 92   Temp 98 F (36.7 C) (Oral)   Resp 20   Ht 5\' 9"  (1.753 m)   Wt 108.4 kg   LMP 12/04/2017 (Exact Date)   SpO2 96%   BMI 35.29 kg/m  or  Vitals:   08/21/18 0431 08/21/18 0501 08/21/18 0531 08/21/18 0601  BP: 116/80 112/85 110/76 117/79  Pulse: 75 93 76 92  Resp:      Temp:      TempSrc:      SpO2:      Weight:      Height:        Dilation: 4 Effacement (%): 40 Station: -3 Presentation: Vertex Exam by:: [employer]: baseline rate 130, moderate varibility, 15x15 acel, no decel Toco: ctx q2-65min  Labs: Lab Results  Component Value Date   WBC 6.9 08/20/2018   HGB 11.9 (L) 08/20/2018   HCT 37.6 08/20/2018   MCV 77.7 (L) 08/20/2018   PLT 174 08/20/2018    Patient Active Problem List   Diagnosis Date Noted  . Poorly controlled diabetes mellitus (HCC) 08/18/2018  . Anxiety 08/12/2018  . Hyperglycemia 08/12/2018  . Polyhydramnios in third trimester, antepartum complication 07/21/2018  . Type 1 diabetes mellitus in pregnancy 07/12/2018  . DKA (diabetic ketoacidoses) (HCC) 07/11/2018  . 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 Preexisting diabetes complicating pregnancy, antepartum 02/11/2018  . Supervision of high-risk pregnancy 02/11/2018  . History of loop electrical excision procedure (LEEP) 09/23/2017  . Back pain 04/27/2016  . Abnormal Papanicolaou smear of cervix with positive human papilloma virus (HPV) test 09/02/2015  . Adjustment disorder with depressed mood 08/03/2015  . Asthma, mild intermittent 07/06/2013  . Type I diabetes mellitus with complication, uncontrolled (HCC) 06/18/2011  . History of migraine during pregnancy 06/18/2011  . Migraine 06/18/2011    Assessment / Plan: 33 y.o. Christie [name] at [redacted]w[redacted]d here for IOL for  uncontrolled T1DM  Labor: latent, s/p cytotec x2; pitocin stopped at 0230 due to late decels, restarted at 0630 Fetal Wellbeing:  Cat 1 Pain Control:  epidural Anticipated MOD:  SVD  Christie [name], D.O.  08/21/2018, 6:28 AM

## 2018-08-21 NOTE — Progress Notes (Addendum)
LABOR PROGRESS NOTE  ICELYNN TOKI is a 33 y.o. [phone] at [redacted]w[redacted]d  admitted for IOL for uncontrolled T1DM  Subjective: Patient is resting comfortably. Baby not tolerating decels well despite position changes and fluid boluses.  Objective: BP (!) 129/94   Pulse 79   Temp 98.3 F (36.8 C) (Oral)   Resp 18   Ht 5\' 9"  (1.753 m)   Wt 108.4 kg   LMP 12/04/2017 (Exact Date)   SpO2 96%   BMI 35.29 kg/m  or  Vitals:   08/21/18 0631 08/21/18 0701 08/21/18 0731 08/21/18 0801  BP: 108/77 96/66 120/85 (!) 129/94  Pulse: 82 79 79 79  Resp:   18 18  Temp:   98.3 F (36.8 C)   TempSrc:   Oral   SpO2:      Weight:      Height:        Dilation: 4 Effacement (%): 80 Station: -1 Presentation: Vertex Exam by:: Earlene Plater, RN  FHT: baseline rate 135, moderate varibility, 15x15 acel, late decel  Labs: Lab Results  Component Value Date   WBC 6.9 08/20/2018   HGB 11.9 (L) 08/20/2018   HCT 37.6 08/20/2018   MCV 77.7 (L) 08/20/2018   PLT 174 08/20/2018    Patient Active Problem List   Diagnosis Date Noted  . Poorly controlled diabetes mellitus (HCC) 08/18/2018  . Anxiety 08/12/2018  . Hyperglycemia 08/12/2018  . Polyhydramnios in third trimester, antepartum complication 07/21/2018  . Type 1 diabetes mellitus in pregnancy 07/12/2018  . DKA (diabetic ketoacidoses) (HCC) 07/11/2018  . 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 Preexisting diabetes complicating pregnancy, antepartum 02/11/2018  . Supervision of high-risk pregnancy 02/11/2018  . History of loop electrical excision procedure (LEEP) 09/23/2017  . Back pain 04/27/2016  . Abnormal Papanicolaou smear of cervix with positive human papilloma virus (HPV) test 09/02/2015  . Adjustment disorder with depressed mood 08/03/2015  . Asthma, mild intermittent 07/06/2013  . Type I diabetes mellitus with complication, uncontrolled (HCC) 06/18/2011  . History of migraine during pregnancy 06/18/2011  . Migraine 06/18/2011    Assessment  / Plan: 33 y.o. [id number] at [redacted]w[redacted]d here for IOL for uncontrolled T1DM  Labor: latent, s/p cytotec x2; pitocin stopped again after 0630 restart due to fetal intolerance; IUPC and FSE placed due to difficulty monitoring and need for frequent position changes Fetal Wellbeing:  Cat II Pain Control:  epidural Anticipated MOD:  SVD  Xan Kellogg, D.O.  08/21/2018, 8:54 AM    Faculty Addendum  In to see patient, reviewed course and need for discontinuing pitocin due to fetal intolerance, has been discontinued overnight twice for same. FHT Cat II currently, occasional late decel but with moderate variability and accels. Overall, reassuring. Reviewed that we will continue current pitocin break for 2 hours, then restart pitocin. Reviewed that if FHT worsens with augmentation, would not be able to continue induction and would recommend proceeding with primary c-section. Reviewed that will plan to restart pitocin after 2 hour break, as she has ruptured membranes and may progress spontaneously, then see how well fetus tolerates augmentation. Answered all questions, pt and family verbalize understanding of the above and are in agreement with plan.   Baldemar Lenis, M.D. Attending Center for [employer] [facility])

## 2018-08-21 NOTE — Anesthesia Postprocedure Evaluation (Signed)
Anesthesia Post Note  Patient: Christie [name]  Procedure(s) Performed: CESAREAN SECTION (N/A Abdomen)     Patient location during evaluation: PACU Anesthesia Type: Epidural Level of consciousness: awake and alert Pain management: pain level controlled Vital Signs Assessment: post-procedure vital signs reviewed and stable Respiratory status: spontaneous breathing, nonlabored ventilation and respiratory function stable Cardiovascular status: blood pressure returned to baseline and stable Postop Assessment: no apparent nausea or vomiting and epidural receding Anesthetic complications: no     Kaylyn Layer

## 2018-08-21 NOTE — Transfer of Care (Signed)
Immediate Anesthesia Transfer of Care Note  Patient: Christie [name]  Procedure(s) Performed: CESAREAN SECTION (N/A Abdomen)  Patient Location: PACU  Anesthesia Type:Epidural  Level of Consciousness: awake, alert  and oriented  Airway & Oxygen Therapy: Patient Spontanous Breathing  Post-op Assessment: Report given to RN and Post -op Vital signs reviewed and stable  Post vital signs: Reviewed and stable  Last Vitals:  Vitals Value Taken Time  BP    Temp    Pulse 124 08/21/2018 11:36 AM  Resp 19 08/21/2018 11:36 AM  SpO2 100 % 08/21/2018 11:36 AM  Vitals shown include unvalidated device data.  Last Pain:  Vitals:   08/21/18 0929  TempSrc: Oral  PainSc:       Patients Stated Pain Goal: 3 (08/17/18 1132)  Complications: No apparent anesthesia complications

## 2018-08-21 NOTE — Progress Notes (Signed)
LABOR PROGRESS NOTE  Christie [name] is a 33 y.o. [phone] at [redacted]w[redacted]d  admitted for IOL for uncontrolled T1DM  Subjective: Patient is resting comfortably. Pain no well-controlled.   Objective: BP 132/82   Pulse 87   Temp 98 F (36.7 C) (Oral)   Resp 20   Ht 5\' 9"  (1.753 m)   Wt 108.4 kg   LMP 12/04/2017 (Exact Date)   SpO2 96%   BMI 35.29 kg/m  or  Vitals:   08/21/18 0039 08/21/18 0101 08/21/18 0131 08/21/18 0201  BP: 117/80 113/75 116/77 132/82  Pulse: 85 72 74 87  Resp:      Temp:      TempSrc:      SpO2:      Weight:      Height:        Dilation: 4 Effacement (%): 30 Station: -3 Presentation: Vertex Exam by:: Dr. Gevena Cotton FHT: baseline rate 150, minimal varibility, no acel, late decel Toco: ctx q 2-29min  Labs: Lab Results  Component Value Date   WBC 6.9 08/20/2018   HGB 11.9 (L) 08/20/2018   HCT 37.6 08/20/2018   MCV 77.7 (L) 08/20/2018   PLT 174 08/20/2018    Patient Active Problem List   Diagnosis Date Noted  . Poorly controlled diabetes mellitus (HCC) 08/18/2018  . Anxiety 08/12/2018  . Hyperglycemia 08/12/2018  . Polyhydramnios in third trimester, antepartum complication 07/21/2018  . Type 1 diabetes mellitus in pregnancy 07/12/2018  . DKA (diabetic ketoacidoses) (HCC) 07/11/2018  . 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 Preexisting diabetes complicating pregnancy, antepartum 02/11/2018  . Supervision of high-risk pregnancy 02/11/2018  . History of loop electrical excision procedure (LEEP) 09/23/2017  . Back pain 04/27/2016  . Abnormal Papanicolaou smear of cervix with positive human papilloma virus (HPV) test 09/02/2015  . Adjustment disorder with depressed mood 08/03/2015  . Asthma, mild intermittent 07/06/2013  . Type I diabetes mellitus with complication, uncontrolled (HCC) 06/18/2011  . History of migraine during pregnancy 06/18/2011  . Migraine 06/18/2011    Assessment / Plan: 33 y.o. [id number] at [redacted]w[redacted]d here for IOL for uncontrolled  T1DM  Labor: latent, s/p cytotec x2; pitocin stopped due to late decels on fetal heart tracing despite 3 fluid boluses and repositioning Fetal Wellbeing:  Cat 3 Pain Control:  epidural Anticipated MOD:  SVD  Xan Murrel Bertram, D.O.  08/21/2018, 2:32 AM

## 2018-08-21 NOTE — Progress Notes (Signed)
Faculty Note  Pt with recurrent late decels despite no augmentation. No improvement with position changes. Recommended proceeding with primary c-section for fetal intolerance, patient tearful but agreeable. Pt previously signed BTL papers, she affirms desire to have BTL done with c-section, reviewed this is permanent and cannot be undone, she affirms desire to have permanent sterilization done.   The risks of cesarean section were discussed with the patient; including but not limited to: infection which may require antibiotics; bleeding which may require transfusion or re-operation; injury to bowel, bladder, ureters or other surrounding organs; need for additional procedures, incisional problems, thromboembolic phenomenon and other postoperative/anesthesia complications. Answered all questions. The patient verbalized understanding of the plan, giving informed consent for the procedure. She is agreeable to blood transfusion in the event of emergency.  Patient has been NPO since last night, she will remain NPO for procedure Anesthesia and OR aware Preoperative prophylactic antibiotics and SCDs ordered on call to the OR  To OR when ready   K. Therese Sarah, M.D. Attending Center for [employer] Midwife)

## 2018-08-21 NOTE — Lactation Note (Signed)
This note was copied from a baby's chart. Lactation Consultation Note  Patient Name: Christie [name]: 08/21/2018 Reason for consult: Initial assessment;Early term 37-38.6wks Type of Endocrine Disorder?: Diabetes P3, 10 hour female infant in NICU Per mom, she has DEBP at home. She tried BF with other children but did not BF them for a long. LC helped mom with hand expression, Mom expressed 3 ml of colostrum that will be taken to NICU infant labels given and mom knows to take EBM less than 4 hours with labels. Mom plans to pump every 3 hours for 15 minutes on initial setting. Mom shown how to use DEBP & how to disassemble, clean, & reassemble parts. [facility] when given medical okay to latch infant to breast. LC discussed ETI behaviors. Mom made aware of O/P services, breastfeeding support groups, community resources, and our phone # for post-discharge questions.  Maternal Data Formula Feeding for Exclusion: No Has patient been taught Hand Expression?: Yes(3 ml of colostrum ) Does the patient have breastfeeding experience prior to this delivery?: Yes  Feeding Feeding Type: Breast Fed  LATCH Score                   Interventions Interventions: Breast feeding basics reviewed;DEBP;Expressed milk;Hand express;Breast massage  Lactation Tools Discussed/Used WIC Program: No Pump Review: Setup, frequency, and cleaning;Milk Storage Initiated by:: Christie [name], IBCLC [name] initiated:: 08/21/18   Consult Status Consult Status: Follow-up [name]: 08/22/18 Follow-up type: In-patient    Christie [name] 08/21/2018, 8:28 PM

## 2018-08-21 NOTE — Discharge Summary (Signed)
Postpartum Discharge Summary     Patient Name: Christie [name] DOB: [birth year] MRN: [id number]  Date of admission: 08/12/2018 Delivering Provider: Conan Bowens   Date of discharge: 08/24/2018  Admitting diagnosis: direct admit Intrauterine pregnancy: [redacted]w[redacted]d     Secondary diagnosis:  Principal Problem:   Preexisting diabetes complicating pregnancy, antepartum Active Problems:   Type I diabetes mellitus with complication, uncontrolled (HCC)   History of migraine during pregnancy   Asthma, mild intermittent   Alpha+ thalassemia trait   Polyhydramnios in third trimester, antepartum complication   Hyperglycemia   Poorly controlled diabetes mellitus (HCC)   Bilateral lower extremity edema  Additional problems: non-reassuring fetal tracing     Discharge diagnosis: Term Pregnancy Delivered                                                                                                Post partum procedures:  Augmentation: Pitocin, Cytotec and Foley Balloon  Complications: Intrauterine Inflammation or infection (Chorioamniotis)  Hospital course:  Induction of Labor With Cesarean Section  33 y.o. yo [phone] at [redacted]w[redacted]d was admitted to the hospital 08/12/2018 for poorly controlled Type 1 DM. She underwent induction of labor on 08/20/17. Patient had a labor course significant for progression to 4 cm, and further inability to augment labor due to non-reassuring fetal tracing. On pitocin break, fetal tracing worsened significantly and she was given terbutaline. The patient went for cesarean section due to Non-Reassuring FHR, and delivered a Viable infant,08/21/2018  Membrane Rupture Time/Date: 7:20 AM ,08/21/2018   Details of operation can be found in separate operative Note, of note, she also underwent bilateral tubal ligation at the time of c-section.  Patient's postpartum course was complicated by significant lower extremity edema. She was discharged with a 5-day course of lasix and potassium.  She is ambulating, tolerating a regular diet, passing flatus, and urinating well.  Patient is discharged home in stable condition on 08/24/18.   Magnesium Sulfate recieved: No BMZ received: No  Physical exam  Vitals:   08/23/18 0549 08/23/18 1349 08/23/18 2151 08/24/18 0624  BP: (!) 130/91 (!) 122/91 135/86 136/80  Pulse: 91 89 85 93  Resp: 16 18 14 16   Temp: 97.9 F (36.6 C) 98.6 F (37 C) 98.4 F (36.9 C) 98.7 F (37.1 C)  TempSrc: Oral Oral Oral Oral  SpO2: 100% 100% 100% 100%  Weight:      Height:       General: alert, cooperative and no distress Lochia: appropriate Uterine Fundus: firm Incision: Prevena in place DVT Evaluation: No evidence of DVT seen on physical exam. Extremities: 2+ pitting edema bilateral  Labs: Lab Results  Component Value Date   WBC 8.5 08/22/2018   HGB 9.4 (L) 08/22/2018   HCT 29.1 (L) 08/22/2018   MCV 78.4 (L) 08/22/2018   PLT 175 08/22/2018   CMP Latest Ref Rng & Units 08/22/2018  Glucose 70 - 99 mg/dL -  BUN 6 - 20 mg/dL -  Creatinine 1.47 - 8.29 mg/dL 5.62  Sodium 130 - 865 mmol/L -  Potassium 3.5 - 5.1 mmol/L -  Chloride  98 - 111 mmol/L -  CO2 22 - 32 mmol/L -  Calcium 8.9 - 10.3 mg/dL -  Total Protein 6.5 - 8.1 g/dL -  Total Bilirubin 0.3 - 1.2 mg/dL -  Alkaline Phos 38 - 409126 U/L -  AST 15 - 41 U/L -  ALT 0 - 44 U/L -    Discharge instruction: per After Visit Summary and "Baby and Me Booklet".  After visit meds:  Allergies as of 08/24/2018      Reactions   Banana Anaphylaxis   Citrus Other (See Comments)   Gums bleed   Adhesive [tape] Rash      Medication List    STOP taking these medications   aspirin EC 81 MG tablet     TAKE these medications   ACCU-CHEK FASTCLIX LANCETS Misc 1 Units by Percutaneous route 4 (four) times daily.   acetaminophen 500 MG tablet Commonly known as:  TYLENOL Take 500 mg by mouth every 6 (six) hours as needed.   albuterol 108 (90 Base) MCG/ACT inhaler Commonly known as:   PROVENTIL HFA;VENTOLIN HFA Inhale 1-2 puffs into the lungs every 6 (six) hours as needed for wheezing or shortness of breath.   busPIRone 10 MG tablet Commonly known as:  BUSPAR Take 10 mg by mouth 2 (two) times daily.   cyclobenzaprine 10 MG tablet Commonly known as:  FLEXERIL Take 1 tablet (10 mg total) by mouth every 8 (eight) hours as needed for muscle spasms.   famotidine 20 MG tablet Commonly known as:  PEPCID Take 1 tablet (20 mg total) by mouth 2 (two) times daily.   furosemide 20 MG tablet Commonly known as:  LASIX Take 1 tablet (20 mg total) by mouth daily.   glucagon 1 MG injection Commonly known as:  GLUCAGON EMERGENCY Inject 1 mg into the muscle once as needed.   ibuprofen 600 MG tablet Commonly known as:  ADVIL,MOTRIN Take 1 tablet (600 mg total) by mouth every 6 (six) hours as needed for mild pain or cramping.   insulin aspart 100 UNIT/ML injection Commonly known as:  NOVOLOG Inject 25 Units into the skin 3 (three) times daily before meals. Patient will need an appointment for further refills. What changed:    how much to take  when to take this  additional instructions   Insulin Pen Needle 31G X 8 MM Misc As needed for insulin injections   insulin pump Soln Inject 1 each into the skin continuous. Novolog   ondansetron 4 MG tablet Commonly known as:  ZOFRAN Take 1 tablet (4 mg total) by mouth every 8 (eight) hours as needed for nausea or vomiting.   oxyCODONE 5 MG immediate release tablet Commonly known as:  Oxy IR/ROXICODONE Take 1 tablet (5 mg total) by mouth every 4 (four) hours as needed for moderate pain.   Potassium Chloride ER 20 MEQ Tbcr Take 10 mEq by mouth daily.   prenatal multivitamin Tabs tablet Take 1 tablet by mouth daily at 12 noon.       Diet: carb modified diet  Activity: Advance as tolerated. Pelvic rest for 6 weeks.   Outpatient follow up:2 weeks Follow up Appt: Future Appointments  Date Time Provider Department  Center  09/05/2018 10:00 AM [facility] NURSE [facility]  09/19/2018 10:55 AM Conan Bowensavis, Kelly M, MD [facility]   Follow up Visit: Please schedule this patient for Postpartum visit in: 4 weeks with the following provider: MD For C/S patients schedule nurse incision check in weeks 2 weeks: yes  High risk pregnancy complicated by: T2DM, uncontrolled Delivery mode:  CS Anticipated Birth Control:  BTL done PP PP Procedures needed: Incision check  Schedule Integrated BH visit: no  Newborn Data: Live born female  Birth Weight: 7 lb 10.9 oz (3485 g) APGAR: 4, 7  Newborn Delivery   Birth date/time:  08/21/2018 10:20:00 Delivery type:  C-Section, Low Transverse Trial of labor:  Yes C-section categorization:  Primary     Baby Feeding: Breast Disposition:NICU   08/24/2018 Gwenevere Abbot, MD

## 2018-08-21 NOTE — Progress Notes (Addendum)
Dr. Earlene Plater at bedside. Discussing with pt need for c-section due to fetal intolerance of labor. Explaining risks and benefits of c-section. Pt plans tubal ligation. Verbalizes that she still wants to forward with tubal. Risks and benefits explained. Pt verbalizes understanding and consents to c-section and tubal ligation. Family at bedside.

## 2018-08-21 NOTE — Op Note (Signed)
Christie [name] PROCEDURE DATE: 08/21/2018  PREOPERATIVE DIAGNOSES: Intrauterine pregnancy at [redacted]w[redacted]d weeks gestation; non-reassuring fetal status, desiring permanent sterility  POSTOPERATIVE DIAGNOSES: The same  PROCEDURE: Primary Low Transverse Cesarean Section, bilateral tubal ligation  SURGEON:  K. Therese Sarah, MD  ASSISTANT:  [name], MD  An experienced assistant was required given the standard of surgical care given the complexity of the case.  This assistant was needed for exposure, dissection, suctioning, retraction, instrument exchange, assisting with delivery with administration of fundal pressure, and for overall help during the procedure.  ANESTHESIOLOGY TEAM: Anesthesiologist: Kaylyn Layer, MD; Bethena Midget, MD CRNA: Graciela Husbands, CRNA Student Nurse Anesthetist: Bethann Berkshire, RN  INDICATIONS: Christie [name] is a 33 y.o. [phone] at [redacted]w[redacted]d here for cesarean section secondary to the indications listed under preoperative diagnoses; please see preoperative note for further details.  The risks of cesarean section were discussed with the patient including but were not limited to: bleeding which may require transfusion or reoperation; infection which may require antibiotics; injury to bowel, bladder, ureters or other surrounding organs; injury to the fetus; need for additional procedures including hysterectomy in the event of a life-threatening hemorrhage; placental abnormalities wth subsequent pregnancies, incisional problems, thromboembolic phenomenon and other postoperative/anesthesia complications.  She consents to blood transfusion in the event of an emergency. The patient verbalized understanding of the plan, giving informed written consent for the procedure.    FINDINGS:  Viable female infant in cephalic presentation.  Apgars 4, 7, 9.  Clear amniotic fluid.  Intact placenta, three vessel cord.  Normal uterus, fallopian tubes and ovaries  bilaterally.  ANESTHESIA: Epidural  INTRAVENOUS FLUIDS: 1800 ml   ESTIMATED BLOOD LOSS: 800 ml URINE OUTPUT:  150 ml SPECIMENS: Placenta sent to L&D COMPLICATIONS: None immediate  PROCEDURE IN DETAIL:  The patient preoperatively received intravenous antibiotics and had sequential compression devices applied to her lower extremities.  She was then taken to the operating room where the epidural anesthesia was dosed up to surgical level and was found to be adequate. She was then placed in a dorsal supine position with a leftward tilt, and prepped and draped in a sterile manner.  A foley catheter was placed into her bladder with sterile technique and attached to constant gravity.  After a timeout was performed, a Pfannenstiel skin incision was made with scalpel and carried through to the underlying layer of fascia. The fascia was incised in the midline, and this incision was extended bilaterally using the Mayo scissors.  Kocher clamps were applied to the superior aspect of the fascial incision and the underlying rectus muscles were dissected off bluntly.  A similar process was carried out on the inferior aspect of the fascial incision. The rectus muscles were separated in the midline bluntly and the peritoneum was entered bluntly. The peritoneal incision was carefully extended bluntly laterally and caudad with good visualization of the bladder. The uterus appeared normal. The Alexis O-ring retractor was placed into the incision, taking care not to incorporate bowel or omentum. The bladder blade was inserted. Attention was turned to the lower uterine segment where a low transverse hysterotomy was made with a scalpel and extended bilaterally bluntly.  The infant was delivered from compound ROP position, nose and mouth were bulb suctioned, and the cord clamped and cut after 1 minute. The infant was then handed over to the waiting neonatology team. Uterine massage was then performed, and the placenta delivered  intact with a three-vessel cord. The uterus was gently cleared  of clots and debris.  The hysterotomy was closed with 0 Vicryl in a running locked fashion, and an imbricating layer was also placed with 0 Vicryl. Figure-of-eight 0 Vicryl serosal stitches were placed to help with hemostasis.  The fallopian tubes and ovaries were visualized bilaterally and normal appearing.   The right fallopian tube was clamped using a babcock and followed out to its fimbriated end. A 3 cm portion of the mid-portion of the fallopian tube was grasped with babcock graspers and elevated. The elevated loop of tube was tied off with 2-0 plain gut. A second tie was placed under the first tie and a 2 cm loop of tube was excised by bluntly creating a hole in the mesosalpinx under the tied off portion above the suture and excising each end of tube with the metzenbaum scissors via a modified pomeroy fashion.  The remaining pedicles of the tube were inspected and found to be hemostatic. The left fallopian tube was visualized and followed out to the fimbriated end.  A similar process was carried out for the left fallopian tube and hemostasis noted at the pedicles. Good hemostasis was noted overall. The Alexis retractor was removed.  The pelvis was cleared of all clot and debris. Arista was placed over the hysterotomy for additional hemostasis.  Hemostasis was again confirmed on all surfaces. The fascia was then closed using 0 PDS in a running fashion.  The subcutaneous layer was irrigated, then reapproximated with 2-0 plain gut, and 30 ml of 0.5% Marcaine was injected subcutaneously around the incision.  The skin was closed with a 4-0 Monocryl subcuticular stitch. The patient tolerated the procedure well. Sponge, lap, instrument and needle counts were correct x 3.  She was taken to the recovery room in stable condition.    Baldemar Lenis, M.D. Attending Obstetrician & Gynecologist, [facility] for [employer], [facility]

## 2018-08-21 NOTE — Addendum Note (Signed)
Addendum  created 08/21/18 1236 by Kaylyn Layer, MD   Clinical Note Signed, Order list changed, Order sets accessed

## 2018-08-22 ENCOUNTER — Encounter (HOSPITAL_COMMUNITY): Payer: Self-pay

## 2018-08-22 LAB — GLUCOSE, CAPILLARY
GLUCOSE-CAPILLARY: 219 mg/dL — AB (ref 70–99)
Glucose-Capillary: 126 mg/dL — ABNORMAL HIGH (ref 70–99)
Glucose-Capillary: 148 mg/dL — ABNORMAL HIGH (ref 70–99)
Glucose-Capillary: 150 mg/dL — ABNORMAL HIGH (ref 70–99)
Glucose-Capillary: 249 mg/dL — ABNORMAL HIGH (ref 70–99)

## 2018-08-22 LAB — CBC
HCT: 29.1 % — ABNORMAL LOW (ref 36.0–46.0)
Hemoglobin: 9.4 g/dL — ABNORMAL LOW (ref 12.0–15.0)
MCH: 25.3 pg — ABNORMAL LOW (ref 26.0–34.0)
MCHC: 32.3 g/dL (ref 30.0–36.0)
MCV: 78.4 fL — ABNORMAL LOW (ref 80.0–100.0)
PLATELETS: 175 10*3/uL (ref 150–400)
RBC: 3.71 MIL/uL — ABNORMAL LOW (ref 3.87–5.11)
RDW: 15.6 % — ABNORMAL HIGH (ref 11.5–15.5)
WBC: 8.5 10*3/uL (ref 4.0–10.5)
nRBC: 0 % (ref 0.0–0.2)

## 2018-08-22 LAB — CREATININE, SERUM
Creatinine, Ser: 0.64 mg/dL (ref 0.44–1.00)
GFR calc Af Amer: >60 mL/min (ref >60)
GFR calc non Af Amer: >60 mL/min (ref >60)

## 2018-08-22 LAB — BIRTH TISSUE RECOVERY COLLECTION (PLACENTA DONATION)

## 2018-08-22 MED ORDER — INSULIN ASPART 100 UNIT/ML ~~LOC~~ SOLN
0.0000 [IU] | SUBCUTANEOUS | Status: DC
  Administered 2018-08-22 (×2): 5 [IU] via SUBCUTANEOUS
  Filled 2018-08-22: qty 1

## 2018-08-22 MED ORDER — BUSPIRONE HCL 10 MG PO TABS
10.0000 mg | ORAL_TABLET | Freq: Two times a day (BID) | ORAL | Status: DC
  Administered 2018-08-22 – 2018-08-24 (×5): 10 mg via ORAL
  Filled 2018-08-22 (×7): qty 1

## 2018-08-22 MED ORDER — GLYCERIN (LAXATIVE) 2.1 G RE SUPP
1.0000 | Freq: Once | RECTAL | Status: DC
  Filled 2018-08-22: qty 1

## 2018-08-22 MED ORDER — INSULIN ASPART 100 UNIT/ML ~~LOC~~ SOLN
10.0000 [IU] | Freq: Three times a day (TID) | SUBCUTANEOUS | Status: DC
  Administered 2018-08-22 – 2018-08-24 (×5): 10 [IU] via SUBCUTANEOUS
  Filled 2018-08-22 (×4): qty 1

## 2018-08-22 MED ORDER — FLEET ENEMA 7-19 GM/118ML RE ENEM: 1.0000 | ENEMA | Freq: Once | RECTAL | Status: DC

## 2018-08-22 MED ORDER — IBUPROFEN 600 MG PO TABS
600.0000 mg | ORAL_TABLET | Freq: Four times a day (QID) | ORAL | Status: DC | PRN
  Administered 2018-08-22 – 2018-08-24 (×6): 600 mg via ORAL
  Filled 2018-08-22 (×6): qty 1

## 2018-08-22 MED ORDER — INSULIN GLARGINE 100 UNIT/ML ~~LOC~~ SOLN: 25.0000 [IU] | Freq: Every day | SUBCUTANEOUS | Status: DC

## 2018-08-22 MED ORDER — INSULIN GLARGINE 100 UNIT/ML ~~LOC~~ SOLN
25.0000 [IU] | Freq: Two times a day (BID) | SUBCUTANEOUS | Status: DC
  Administered 2018-08-22 – 2018-08-24 (×5): 25 [IU] via SUBCUTANEOUS
  Filled 2018-08-22 (×6): qty 0.25

## 2018-08-22 NOTE — Progress Notes (Signed)
CSW acknowledges consult.  CSW attempted to meet with MOB, however MOB was not in the room.  CSW will attempt to visit with MOB at a later time.   Marabelle Cushman, LCSW Clinical Social Worker [facility] Cell#: [phone]  

## 2018-08-22 NOTE — Progress Notes (Signed)
Inpatient Diabetes Program Recommendations  AACE/ADA: New Consensus Statement on Inpatient Glycemic Control (2015)  Target Ranges:  Prepandial:   less than 140 mg/dL      Peak postprandial:   less than 180 mg/dL (1-2 hours)      Critically ill patients:  140 - 180 mg/dL   Lab Results  Component Value Date   GLUCAP 150 (H) 08/22/2018   HGBA1C 12.4 (H) 08/13/2018    Review of Glycemic Control Results for REBEKKAH, GOLA (MRN [id number]) as of 08/22/2018 10:10  Ref. Range 08/21/2018 20:04 08/22/2018 00:40 08/22/2018 04:52 08/22/2018 07:57  Glucose-Capillary Latest Ref Range: 70 - 99 mg/dL 952 (H) 841 (H) 324 (H) 150 (H)   Diabetes history:Type 1 DM Outpatient Diabetes medications:Medtronic insulin pump Current orders for Inpatient glycemic control:Novolog 10 units TID, Novolog 0-15 units TID, Levemir 25units BID  Inpatient Diabetes Program Recommendations:  Received page regarding patient's concern for updated post partum orders.   Noted patient received Decadron 4 mg x 1 intra-op, thus explaining increases to glucose trends. Expect to trend down today. In agreement with current orders. Anticipate need for possible reduction to Lantus to 35 units QD.  @1010 : Spoke with patient to review current orders. Discussed differences between insulin administration timing as well as collection of CBGs antepartum vs. Postpartum, hormonal changes and varying levels of insulin resistance. Stressed the importance of patient having snack nearby, especially in the immediate postpartum period d/t risk of hypoglycemia. Patient is a type 1 and plans to make appointment with Dr Sharl Ma for insulin pump placement and settings. Patient comfortable with injections. Patient reassured and has no further questions at this time.   @1345 - Noted that patient was given Novolog 10 units with meal, however no CBG was record at the time of discovery. Added meal coverage parameters to current order to ensure safety.  Spoke with RN and patient again regarding current orders. RN educated on importance of collecting CBGs at time of insulin delivery, discussed meal coverage parameters, reviewed current orders and when to collect CBGs. RN states, "The patient wants me to give after the meal, like she was doing on the other unit." Explained that post prandials are typically obtained during pregnancy, as the patient was pregnant and needed tighter target goals per ADA recommendations. However, practice post partum consists of administering insulin with meals and adding correction to dosages with the meal. Reviewed current orders with RN and my desire to provide consistent care between patient, RN and provider as patient preferences are not in alignment with orders. Encouraged RN to reach out to MD if they needed to be changed to ensure consistency, however feel that they are appropriate at this time.  Spoke with patient again regarding insulin delivery. Patient does not seem to remember speaking prior. Reviewed the need for insulin to be given with meals and reviewed differences now that she had delivered. Patient verbalized understanding.   Thanks, Lujean Rave, MSN, RNC-OB Diabetes Coordinator [phone] (8a-5p)

## 2018-08-22 NOTE — Addendum Note (Signed)
Addendum  created 08/22/18 0829 by Jennelle Human, CRNA   Clinical Note Signed

## 2018-08-22 NOTE — Anesthesia Postprocedure Evaluation (Signed)
Anesthesia Post Note  Patient: BONNETTA SEIWERT  Procedure(s) Performed: CESAREAN SECTION WITH BILATERAL TUBAL LIGATION (Bilateral Abdomen)     Patient location during evaluation: Mother Baby Anesthesia Type: Epidural Level of consciousness: awake, oriented and awake and alert Pain management: pain level controlled Vital Signs Assessment: post-procedure vital signs reviewed and stable Respiratory status: spontaneous breathing and nonlabored ventilation Cardiovascular status: blood pressure returned to baseline and stable Postop Assessment: no headache, no backache, able to ambulate, adequate PO intake, patient able to bend at knees and no apparent nausea or vomiting Anesthetic complications: no Comments: Pt had one small hot spot during closure that required additional local - which resolved her discomfort     Last Vitals:  Vitals:   08/22/18 0130 08/22/18 0609  BP: 105/75 103/72  Pulse: 91 71  Resp: 18 16  Temp: 36.9 C 36.9 C  SpO2: 97% 99%    Last Pain:  Vitals:   08/22/18 0609  TempSrc: Axillary  PainSc:    Pain Goal: Patients Stated Pain Goal: 3 (08/21/18 1200)                 Jennelle Human

## 2018-08-22 NOTE — Progress Notes (Addendum)
Subjective: Was called to patient's room due to severe abdominal pain. Earlier today around 10 AM she ambulated in her room then was pushing a wheelchair up to see her son in the NICU.  She came back down around 1:00.  At this time the patient severe abdominal pain began.  Patient states she has not had a bowel movement since Monday and is unsure if she has passed gas. She reports she does not have bowel movements regularly and when she is very constipated uses Fleet enemas, approximately once weekly.  She has tried MiraLAX in the past but cannot tolerate the flavor.  The abdominal pain is making ambulation difficult.  Otherwise, she is able to eat and drink.  She is currently day 1 status post pLTCS/BTL.    Objective:  General: Patient pleasant, in moderate discomfort secondary to pain CV: RRR, S1-S2 present, no murmurs, rubs, gallops Abdomen: Distended, tenderness to palpation, normal bowel sounds present in all 4 quadrants Extremities: SCDs in place to bilateral lower extremities, 1+ nonpitting pedal edema  Assessment: Christie [name] is a 33 year old female day 2 status post pLTCS/BTL experiencing abdominal pain secondary to surgery versus gas/constipation.  Pain does not appear to be due to ileus or other surgical complication as there are bowel sounds present and patient is tolerating food and drink well.  Plan: Constipation: -Patient encouraged to ambulate and chew gum -Patient may have either Fleet enema or glycerin suppository (both options were ordered for patient to choose from) -Patient administered oxycodone   Peggyann Shoals, DO [facility], PGY-1 08/22/2018 2:19 PM  I saw this pt with Dr Dareen Piano and I agree with the above. Home Buspar restarted as well.  Arabella Merles CNM 2:54 PM 08/22/2018

## 2018-08-22 NOTE — Progress Notes (Addendum)
Post Partum/Op Day 1 s/p pLTCS and BTL due to failed IOL for poorly controlled T1DM and Polyhydramnios  Subjective: voiding, tolerating PO and + flatus. The patient has not been ambulating much because her feet are so swollen. She has been ambulating some to the bathroom in the room but has yet to walk in the hallway. Her blood sugar has also been elevated on the regimen of Novolog 0-15 units TID qAC, Novolog 0-15 q4 hours, and Lantus 20U qHS.   Objective: Blood pressure 103/72, pulse 71, temperature 98.4 F (36.9 C), temperature source Axillary, resp. rate 16, height 5\' 9"  (1.753 m), weight 108.4 kg, last menstrual period 12/04/2017, SpO2 99 %, unknown if currently breastfeeding.  Physical Exam:  General: alert, cooperative, appears stated age and no distress Lochia: appropriate Uterine Fundus: firm Incision: healing well, no significant drainage, no dehiscence, no significant erythema DVT Evaluation: No evidence of DVT seen on physical exam. SCDs are in place, no tenderness to palpation. Bilateral 2+ nonpitting edema to lower extremities.   Recent Labs    08/19/18 0937 08/20/18 2139  HGB 12.5 11.9*  HCT 39.1 37.6    Assessment/Plan: Plan for discharge tomorrow, Breastfeeding and Contraception BTL (performed).  Blood sugar: poorly controlled, acutely worse on above regimen. - Carb modified diet - Change regimen to Novolog 10 U TID qAC; Lantus 25 U BID - Daily fasting blood sugar qAM - CBG TID before meals  Swelling: patient experiencing 2+ nonpitting pedal edema, occurred with last pregnancy and responded well at the time to diuretics - Encourage ambulation - SCDs and leg elevation while at rest - Low-sodium diet to decrease fluid retention - Will consider Diuretic if no improvement (hesitant to use diuretic d/t patient's desire to breastfeed as they will decr milk supply)   LOS: 4 days   Dollene Cleveland 08/22/2018, 9:31 AM   I have participated in the care of this  patient and I agree with the above.  Hemoglobin & Hematocrit- POD#1    Component Value Date/Time   HGB 9.4 (L) 08/22/2018 1022   HGB 13.8 02/11/2018 1147   HCT 29.1 (L) 08/22/2018 1022   HCT 40.4 02/11/2018 1147   Arabella Merles  CNM 12:10 PM 08/22/2018

## 2018-08-22 NOTE — Progress Notes (Signed)
Patient remains in NICU with infant, will pass on medications required to following shift.

## 2018-08-23 ENCOUNTER — Encounter (HOSPITAL_COMMUNITY): Payer: Self-pay | Admitting: Obstetrics and Gynecology

## 2018-08-23 LAB — GLUCOSE, CAPILLARY
Glucose-Capillary: 81 mg/dL (ref 70–99)
Glucose-Capillary: 106 mg/dL — ABNORMAL HIGH (ref 70–99)
Glucose-Capillary: 149 mg/dL — ABNORMAL HIGH (ref 70–99)

## 2018-08-23 MED ORDER — FUROSEMIDE 40 MG PO TABS
40.0000 mg | ORAL_TABLET | Freq: Once | ORAL | Status: AC
  Administered 2018-08-23: 40 mg via ORAL
  Filled 2018-08-23: qty 1

## 2018-08-23 NOTE — Progress Notes (Signed)
Pt states she is unable to answer edinburgh questions, questions read to her, but still unable or unwilling to answer questions. MSW consult placed.

## 2018-08-23 NOTE — Progress Notes (Signed)
Post Partum/Op Day #2 s/p pLTCS and BTL   Subjective: up ad lib, voiding, tolerating PO and + flatus. The patient is finally able to pass gas but has yet to have a BM. She is very upset this morning because she's dreading being discharged home tomorrow, possibly without her baby who is in the NICU. She continues to have 2+ swelling in her bilateral lower extremities. The pain in her abdomen is better but is still there, she believes now that it is gas-related. She continues to try to pump to provide the baby with breastmilk but she is low on supply. She is emotionally up and down but is hopeful.   Objective: Blood pressure (!) 130/91, pulse 91, temperature 97.9 F (36.6 C), temperature source Oral, resp. rate 16, height 5\' 9"  (1.753 m), weight 108.4 kg, last menstrual period 12/04/2017, SpO2 100 %, unknown if currently breastfeeding.  Physical Exam:  General: alert, cooperative, appears stated age, no distress and moderately obese Lochia: appropriate Uterine Fundus: firm, right at the level of the umbilicus but the patient needs to urinate Incision: healing well, no significant drainage, no dehiscence, no significant erythema; wound vac remains in place DVT Evaluation: No evidence of DVT seen on physical exam. No significant calf/ankle edema.  Recent Labs    08/20/18 2139 08/22/18 1022  HGB 11.9* 9.4*  HCT 37.6 29.1*   Assessment/Plan: Plan for discharge tomorrow, Breastfeeding and Contraception UNDECIDED Depression/Anxiety: continue Buspar Swelling: patient given 40mg  Lasix PO, encouraged to ambulate    LOS: 5 days   Dollene Cleveland 08/23/2018, 10:30 AM

## 2018-08-23 NOTE — Clinical Social Work Maternal (Signed)
CLINICAL SOCIAL WORK MATERNAL/CHILD NOTE  Patient Details  Name: IESHA SUMMERHILL MRN: [id number] Date of Birth: 1985-11-25  Date:  08/23/2018  Clinical Social Worker Initiating Note:  Madilyn Fireman, MSW, LCSW-A Date/Time: Initiated:  08/23/18/1142     Child's Name:  Manuela Schwartz   Biological Parents:  Mother, Father   Need for Interpreter:  None   Reason for Referral:  Behavioral Health Concerns   Address:  [place] [address]    Phone number:  [phone] (home)     Additional phone number:   Household Members/Support Persons (HM/SP):   Household Member/Support Person 1   HM/SP Name Relationship DOB or Age  HM/SP -[place] Mother    HM/SP -2        HM/SP -3        HM/SP -4        HM/SP -5        HM/SP -6        HM/SP -7        HM/SP -8          Natural Supports (not living in the home):   Family members   Professional Supports:  Juanita Laster, LCSW    Employment: Unemployed   Type of Work:    Education:  Programmer, systems   Homebound arranged:    Museum/gallery curator Resources:  [employer]   Other Resources:  [employer], Physicist, medical    Cultural/Religious Considerations Which May Impact Care:  None  Strengths:  Ability to meet basic needs , Home prepared for child , Psychotropic Medications, Compliance with medical plan , Understanding of illness   Psychotropic Medications:  Buspar      Pediatrician:       Pediatrician List:   [place]      Pediatrician Fax Number:    Risk Factors/Current Problems:  Mental Health Concerns    Cognitive State:  Able to Concentrate , Alert , Goal Oriented    Mood/Affect:  Calm , Comfortable    CSW Assessment: CSW met with MOB and sister at bedside to complete assessment. CSW obtained permission from MOB to discuss concerns with sister present. MOB was in bed with her feet  elevated in attempts to reduce swelling. MOB reports that this is her third birth and first c-section. MOB reports great support at home, including her sister, mother, and older children. MOB has a 9 and 19 year old. MOB states that she has type 1 diabetes. MOB delivered at [redacted]w[redacted]d newborn was admitted to NICU for glucose monitoring. MOB reported feeling extremely sad when attempting to complete EPDS, but stated she will complete the assessment. MOB reports struggling emotionally with being separated from newborn, but is optimistic and hopeful about their reunification. MOB reports taking 10 mg of Buspar twice daily, was restarted on this medication last night. MOB denies any suicidal or homicidal ideations. MOB reports having a car seat and crib for newborn. MOB was counseled on SIDS precautions and safe transportation of newborn.  MOB receiving WIC and Food Stamps. MOB and CSW discussed outpatient therapy, and MOB was agreeable to a resource list to look into post discharge. CSW encouraged MOB to reach out for assistance if any needs arise, MOB agreeable.  CSW Plan/Description:  Other Information/Referral to [place]2/15/2020, 11:59 AM

## 2018-08-23 NOTE — Lactation Note (Signed)
This note was copied from a baby's chart. Lactation Consultation Note  Patient Name: Christie [name] [id number] Date: 08/23/2018  Mom just finished pumping a few mls of colostrum.  She is discouraged she is not obtaining more volume.  Reassured and discussed milk coming to volume.  Stressed importance of pumping 8-12 times/24 hours.  She states her father plans on purchasing a pump for her.  Encouraged to call for assist/concerns prn.   Maternal Data    Feeding Feeding Type: Formula Nipple Type: Nfant Slow Flow (purple)  LATCH Score                   Interventions    Lactation Tools Discussed/Used     Consult Status      Huston Foley 08/23/2018, 2:37 PM

## 2018-08-24 DIAGNOSIS — R600 Localized edema: Secondary | ICD-10-CM

## 2018-08-24 LAB — GLUCOSE, CAPILLARY: Glucose-Capillary: 149 mg/dL — ABNORMAL HIGH (ref 70–99)

## 2018-08-24 MED ORDER — POTASSIUM CHLORIDE ER 20 MEQ PO TBCR: 10.0000 meq | EXTENDED_RELEASE_TABLET | Freq: Every day | ORAL | 0 refills | Status: DC

## 2018-08-24 MED ORDER — ENOXAPARIN SODIUM 60 MG/0.6ML ~~LOC~~ SOLN
60.0000 mg | SUBCUTANEOUS | Status: DC
  Filled 2018-08-24: qty 0.6

## 2018-08-24 MED ORDER — ONDANSETRON HCL 4 MG PO TABS: 4.0000 mg | ORAL_TABLET | Freq: Once | ORAL | Status: DC

## 2018-08-24 MED ORDER — OXYCODONE HCL 5 MG PO TABS: 5.0000 mg | ORAL_TABLET | ORAL | 0 refills | Status: DC | PRN

## 2018-08-24 MED ORDER — IBUPROFEN 600 MG PO TABS: 600.0000 mg | ORAL_TABLET | Freq: Four times a day (QID) | ORAL | 0 refills | Status: DC | PRN

## 2018-08-24 MED ORDER — FUROSEMIDE 20 MG PO TABS: 20.0000 mg | ORAL_TABLET | Freq: Every day | ORAL | 11 refills | Status: DC

## 2018-08-24 MED ORDER — ONDANSETRON HCL 4 MG PO TABS
4.0000 mg | ORAL_TABLET | Freq: Three times a day (TID) | ORAL | Status: DC | PRN
  Administered 2018-08-24: 4 mg via ORAL
  Filled 2018-08-24: qty 1

## 2018-08-24 MED ORDER — AMLODIPINE BESYLATE 5 MG PO TABS: 5.0000 mg | ORAL_TABLET | Freq: Every day | ORAL | 0 refills | Status: DC

## 2018-08-24 NOTE — Progress Notes (Signed)
DR. Earlene Plater called and given report of patient complaint of headache that is making her eyes hurt, not feeling well in NICU, sent back to her room by NICU staff. This nurse encouraged pt to order breakfast at this time, currently 1030. Get in bed and rest, blood sugar checked and given am insulin and sliding scale. Ate breakfast. At 1140 BP checked 143/103. Dr Earlene Plater to put in order for a BP medication to go home with and follow up for BP check/ PP visit.

## 2018-08-24 NOTE — Lactation Note (Signed)
This note was copied from a baby's chart. Lactation Consultation Note  Patient Name: Christie [name] [id number] Date: 08/24/2018   Baby 73 hours old in NICU.   Mother has DEBP.  Discussed engorgement care, pumping rooms and milk storage. Mother is taking Flexiril L3, Insulin L1, and Buspar L3. Mother aware to discuss medications with Ped MD when discharged.       Maternal Data    Feeding Feeding Type: Formula Nipple Type: Nfant Slow Flow (purple)  LATCH Score                   Interventions    Lactation Tools Discussed/Used     Consult Status      Hardie Pulley 08/24/2018, 11:37 AM

## 2018-08-24 NOTE — Progress Notes (Signed)
New discharge instructions discussed with patient and her family. A new copy od her AVS given as well.

## 2018-08-26 ENCOUNTER — Encounter: Payer: Self-pay | Admitting: Obstetrics & Gynecology

## 2018-08-28 ENCOUNTER — Telehealth: Payer: Self-pay | Admitting: Obstetrics and Gynecology

## 2018-08-28 NOTE — Telephone Encounter (Signed)
Patient called about getting her wound VAC removed. She has questions about how it will be removed. Requesting a call back from the nurse.

## 2018-08-28 NOTE — Telephone Encounter (Signed)
I called Christie [name] and answered her questions.  I explained we will remove wound vac at her appointment tomorrow by slowly peeling it off. She states the wound vac is not working now and wanted to know if she can remove it. I informed her she can but first wash hands, then peel off slowly and stop if she has any difficulty or meets resistance, has pain, etc. I advised her to check wound with mirror for gaping open, bleeding, discharge , etc and keep her appointment tomorrow as scheduled. She voices understanding.

## 2018-08-29 ENCOUNTER — Ambulatory Visit (INDEPENDENT_AMBULATORY_CARE_PROVIDER_SITE_OTHER): Payer: Medicaid Other | Admitting: *Deleted

## 2018-08-29 VITALS — BP 126/89 | HR 96 | Ht 69.0 in | Wt 224.9 lb

## 2018-08-29 DIAGNOSIS — Z5189 Encounter for other specified aftercare: Secondary | ICD-10-CM

## 2018-08-29 NOTE — Progress Notes (Signed)
Mom in for would check this morning. She is having difficulty with getting milk out of her right breast. This is mom's 3rd infant. She has pumped for all 3. She reports she had similar issues with her other children.   Infant is 32 days old, mom is pumping and bottle feeding infant. Mom is pumping with Medela PIS about every 3 hours.   She has lumps/swelling to outer and upper right breast and is able to get one ounce out of that breast. She is able to pump 2-2.5 ounces out of the left breast per pumping.   Noralee Stain, RN, IBCLC  Engorgement treatment reviewed with mom. Enc ice packs for 10-20 minutes prior to pumping. Pumping about every 2 hours. Ibuprofen for pain. Ice for 24-48 hours only. Once milk flowing better can use warm moist compresses to the breast. Mom to call back on Monday if breasts have not improved.   Mom voiced understanding to plan.

## 2018-08-29 NOTE — Progress Notes (Signed)
Pt here for wound vac removal and wound check.  Pt reports she removed her wound vac at home.  Wound appeared well approximated and no redness, drainage, or other s/s of infection present.  Wound care and s/s of infection reviewed with pt.  Pt reports that she feels that she is having some PTSD following her delivery and baby's NICU stay.  Encouraged pt to speak with integrated behavioral health.  Pt verbalized understanding and stated she would make an appointment to see integrated behavioral health.

## 2018-08-29 NOTE — Progress Notes (Signed)
Chart reviewed - agree with RN documentation.   

## 2018-09-02 ENCOUNTER — Encounter: Payer: Self-pay | Admitting: Obstetrics & Gynecology

## 2018-09-02 ENCOUNTER — Telehealth: Payer: Self-pay | Admitting: *Deleted

## 2018-09-02 NOTE — Telephone Encounter (Signed)
Per my notes, her initial pump settings for her most recent pregnancy were:   Initial    Most recent during pregnancy Basal Rate:  2.9 u / hr = 69 u /day 3.5 units / hour = 84 u / day Carb Ratio 1.35 u /Carb Choice 4.0 units / carb choice Sensitivity 1 u/15 mg/dl  1 u /57 mg/dl Target   [id number] Active insulin  4 hours  Today, I assisted her in decreasing her basal rate from 3.5 u / hour back to 2.9 u / hr per her request.  All other settings remained the same.   I explained that if her blood sugars started dropping after meals or after correcting high blood sugars, to let me know and we would adjust other settings as needed.  She verbalized understanding and agreed with above.

## 2018-09-03 ENCOUNTER — Telehealth (HOSPITAL_COMMUNITY): Payer: Self-pay | Admitting: Lactation Services

## 2018-09-03 NOTE — Telephone Encounter (Signed)
Christie [name] called today to say her right breast is still not releasing. She is able to soften the left breast.   The right breast has the same area to the upper region toward her shoulder that is not draining. She gets the best response from taking a warm shower and massage. The area if painful. She has no fever or flu like symptoms.   Enc mom to apply warm moist compresses to the area for 10-20 minutes and follow with pumping for 15-20 minutes. Enc mom to massage breast with pumping.  Enc mom to follow pumping with hand expression on the right breast. Mom denies any breast surgeries and reports she had the same issues with her other children leading to mastitis. She reports she was told both times previously to stop pumping.   Enc mom to continue pumping at this time to avoid further back up and mastitis.   Left set of # 27 flanges at the front desk for mom to come and pick up. Suggested Sunflower Lecithin 1200 mg QID to try.    Mom to call back by Friday if not improved. Would recommend an [place] to rule out abscess if still not resolved.   Mom to try above and call back and let me know if it resolves.

## 2018-09-05 ENCOUNTER — Telehealth (HOSPITAL_COMMUNITY): Payer: Self-pay | Admitting: Lactation Services

## 2018-09-05 ENCOUNTER — Ambulatory Visit: Payer: Self-pay

## 2018-09-05 ENCOUNTER — Ambulatory Visit (INDEPENDENT_AMBULATORY_CARE_PROVIDER_SITE_OTHER): Payer: Medicaid Other | Admitting: Obstetrics & Gynecology

## 2018-09-05 VITALS — BP 129/86 | HR 95 | Wt 224.1 lb

## 2018-09-05 DIAGNOSIS — Z5189 Encounter for other specified aftercare: Secondary | ICD-10-CM

## 2018-09-05 NOTE — Telephone Encounter (Signed)
Mom in the Center for [facility] for incision check.   Mom is still having difficulty with pumping. Mom tried ice for 24-48 hours with no relief in the fullness of her breast. Now both breasts are more swollen today. Mom has changed to # 27 flanges as she has cracks around the base of the nipple that are improving per mom. Asked Clinic to call in All Purpose Nipple Ointment. Comfort Gels given with instructions for use and cleaning. Advised mom not to use Lanolin with cracks.    Mom pumping every 2 hours around the clock for 30 minutes with a Medela Freestyle. Mom able to turn suction up to level 3. Mom is able to pump about 2-3 ounces from the left breast and 1-1.5 ounces on the right breast. Mom reports sometimes she will take a hot shower and she cannot get any milk to flow. Discussed that the hot shower may bring congestion to the breast and impede milk flow.   Mom with lumpiness to the breasts, reviewed trying Sunflower Lecithin 1200 mg 4 x a day to help with plugs and see if milk with flow easier. Mom does not have fever, redness to breast or flu like symptoms.   Mom is using a Medela Freestyle pump. Mom using a hands free bra and massage with pumping.  Mom is a [facility] client, Enc her to call and see if she can get a Symphony pump from them.   Reviewed trying Cabbage leaves 2 x a day for 15 minutes inside bra for the next 2 days to see if we can decrease swelling.   Mom pumped in the office and had pumped an hour prior to being here. She pumped 1.5 oz out of the left breast and 1 ounce out of the right breast.

## 2018-09-05 NOTE — Progress Notes (Signed)
Pt here today for wound check s/p c-section.  Pt reports having a burning or stinging sensation at the incision site.  Observed healthy looking incision with no drainage, no redness, or warmness.  There is scar tissue that is palpated on the left side of incision.  Pt reports that is where she is having the burning sensation.  I explained to the pt that it is normal to have that sensation at the site and to take ibuprofen because it can help with the inflammation and healing.  Pt stated understanding with no further questions.   Nipple cream phoned in to [facility] @ [phone] to pharmacist.

## 2018-09-07 NOTE — Progress Notes (Signed)
I have reviewed the chart and agree with nursing staff's documentation of this patient's encounter.  Felisia Balcom, MD 09/07/2018 9:10 AM    

## 2018-09-19 ENCOUNTER — Ambulatory Visit (INDEPENDENT_AMBULATORY_CARE_PROVIDER_SITE_OTHER): Payer: Medicaid Other | Admitting: Obstetrics and Gynecology

## 2018-09-19 ENCOUNTER — Encounter: Payer: Self-pay | Admitting: Obstetrics and Gynecology

## 2018-09-19 ENCOUNTER — Other Ambulatory Visit: Payer: Self-pay

## 2018-09-19 VITALS — BP 109/75 | HR 86 | Ht 69.0 in | Wt 221.0 lb

## 2018-09-19 DIAGNOSIS — N941 Unspecified dyspareunia: Secondary | ICD-10-CM

## 2018-09-19 DIAGNOSIS — Z9889 Other specified postprocedural states: Secondary | ICD-10-CM

## 2018-09-19 DIAGNOSIS — Z9851 Tubal ligation status: Secondary | ICD-10-CM

## 2018-09-19 DIAGNOSIS — O99345 Other mental disorders complicating the puerperium: Secondary | ICD-10-CM

## 2018-09-19 DIAGNOSIS — F53 Postpartum depression: Secondary | ICD-10-CM

## 2018-09-19 DIAGNOSIS — Z1389 Encounter for screening for other disorder: Secondary | ICD-10-CM | POA: Diagnosis not present

## 2018-09-19 NOTE — Progress Notes (Signed)
Obstetrics/Postpartum Visit  Appointment Date: 09/19/2018  OBGYN Clinic: [facility]  Primary Care Provider: Reva Bores  Chief Complaint:  Chief Complaint  Patient presents with  . Postpartum Care    History of Present Illness: Christie [name] is a 33 y.o. African-American [id number] (No LMP recorded.), seen for the above chief complaint. Her past medical history is significant for Type 1 DM.   She is s/p RCS on 08/21/18 at 37 weeks; she was discharged to home on POD#3. Pregnancy complicated by poorly controlled DM.  Complains of pain with intercourse, deep thrusting.  Vaginal bleeding or discharge: No  Breast or formula feeding: both Intercourse: Yes  Contraception: s/p BTL PP depression s/s: Yes  Any bowel or bladder issues: No  Pap smear: no abnormalities (date: 09/2017)  Review of Systems: Positive for n/a.   Her 12 point review of systems is negative or as noted in the History of Present Illness.  Patient Active Problem List   Diagnosis Date Noted  . Bilateral lower extremity edema 08/24/2018  . Poorly controlled diabetes mellitus (HCC) 08/18/2018  . Anxiety 08/12/2018  . Hyperglycemia 08/12/2018  . Polyhydramnios in third trimester, antepartum complication 07/21/2018  . Type 1 diabetes mellitus in pregnancy 07/12/2018  . DKA (diabetic ketoacidoses) (HCC) 07/11/2018  . 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 Preexisting diabetes complicating pregnancy, antepartum 02/11/2018  . Supervision of high-risk pregnancy 02/11/2018  . History of loop electrical excision procedure (LEEP) 09/23/2017  . Back pain 04/27/2016  . Abnormal Papanicolaou smear of cervix with positive human papilloma virus (HPV) test 09/02/2015  . Adjustment disorder with depressed mood 08/03/2015  . Asthma, mild intermittent 07/06/2013  . Type I diabetes mellitus with complication, uncontrolled (HCC) 06/18/2011  . History of migraine during pregnancy 06/18/2011  . Migraine 06/18/2011     Medications Afomia D. Meinders had no medications administered during this visit. Current Outpatient Medications  Medication Sig Dispense Refill  . ACCU-CHEK FASTCLIX LANCETS MISC 1 Units by Percutaneous route 4 (four) times daily. 100 each 12  . albuterol (PROVENTIL HFA;VENTOLIN HFA) 108 (90 Base) MCG/ACT inhaler Inhale 1-2 puffs into the lungs every 6 (six) hours as needed for wheezing or shortness of breath. 1 Inhaler 0  . busPIRone (BUSPAR) 10 MG tablet Take 10 mg by mouth 2 (two) times daily.    Marland Kitchen glucagon (GLUCAGON EMERGENCY) 1 MG injection Inject 1 mg into the muscle once as needed. 1 each 12  . ibuprofen (ADVIL,MOTRIN) 600 MG tablet Take 1 tablet (600 mg total) by mouth every 6 (six) hours as needed for mild pain or cramping. 30 tablet 0  . insulin aspart (NOVOLOG) 100 UNIT/ML injection Inject 25 Units into the skin 3 (three) times daily before meals. Patient will need an appointment for further refills. (Patient taking differently: Inject 0-84 Units into the skin daily. Insulin pump) 10 mL 1  . Insulin Human (INSULIN PUMP) SOLN Inject 1 each into the skin continuous. Novolog    . Insulin Pen Needle 31G X 8 MM MISC As needed for insulin injections 100 each 1  . cyclobenzaprine (FLEXERIL) 10 MG tablet Take 1 tablet (10 mg total) by mouth every 8 (eight) hours as needed for muscle spasms. (Patient not taking: Reported on 09/19/2018) 30 tablet 1   No current facility-administered medications for this visit.     Allergies Banana; Citrus; and Adhesive [tape]  Physical Exam:  BP 109/75   Pulse 86   Ht 5\' 9"  (1.753 m)   Wt 221 lb (  100.2 kg)   Breastfeeding No   BMI 32.64 kg/m  Body mass index is 32.64 kg/m. General appearance: Well nourished, well developed female in no acute distress.  Cardiovascular: regular rate and rhythm Respiratory:  Clear to auscultation bilateral. Normal respiratory effort Abdomen: positive bowel sounds and no masses, hernias; diffusely non tender to  palpation, non distended, well healed pfannenstiel incision Breasts: not examined. Neuro/Psych:  Normal mood and affect.  Skin:  Warm and dry.    PP Depression Screening:   Edinburgh Postnatal Depression Scale - 09/19/18 1122      Edinburgh Postnatal Depression Scale:  In the Past 7 Days   I have been able to laugh and see the funny side of things.  1    I have looked forward with enjoyment to things.  0    I have blamed myself unnecessarily when things went wrong.  2    I have been anxious or worried for no good reason.  3    I have felt scared or panicky for no good reason.  2    Things have been getting on top of me.  2    I have been so unhappy that I have had difficulty sleeping.  0    I have felt sad or miserable.  1    I have been so unhappy that I have been crying.  2    The thought of harming myself has occurred to me.  0    Edinburgh Postnatal Depression Scale Total  13       Assessment: Patient is a 33 y.o. [id number] who is 4 weeks post partum from a RCS+BTL. She is doing very well. Is having some depression symptoms, denies suicidal/homicidal ideation.  Plan:   1. Postoperative state No issues  2. Pain in female genitalia on intercourse Pain with intercourse, deep thrusting Recommended she wait another month to heal prior to attempting intercours  3. S/P tubal ligation Husband going to get vasectomy  4. Postpartum state Doing well, baby gaining weight She is pumping  5. Postpartum depression To see Asher Muir with behavioral health  6. Type 1 DM - states well controlled - has appt with endo for follow up/management  RTC for annual   Christie [name], M.D. Attending Center for [employer] [facility])

## 2018-09-23 ENCOUNTER — Telehealth (HOSPITAL_COMMUNITY): Payer: Self-pay | Admitting: Lactation Services

## 2018-09-23 NOTE — Telephone Encounter (Signed)
Called Ms. Routh at request of Marylynn Pearson. Aylia reports she stopped pumping as her breasts were so painful last week. She reports her breasts feel more normal. She would like to know if it would be ok to pump about every 4 hours and give that milk to the baby, enc her to do so. She is able to still express milk in the shower currently and would like infant to have some EBM in a bottle. She has no further questions/concerns at this time. She has phone # to call with questions/concerns as needed.

## 2018-09-29 ENCOUNTER — Encounter: Payer: Self-pay | Admitting: *Deleted

## 2018-10-29 ENCOUNTER — Telehealth (HOSPITAL_COMMUNITY): Payer: Self-pay | Admitting: Lactation Services

## 2018-10-29 NOTE — Telephone Encounter (Signed)
[facility] called with concerns of pain to right nipple and radiating up into breast for the last 3 days. The pain is keeping her up at night. She is using a bag of frozen corn intermittently to the nipple with a cloth in between and is noticing it is not helping and burning is still present after compress.   She reports she dried up her milk over a 2 week period and stopped nursing and pumping about a month ago.   Christie [name] says her nipple looks completely normal and there is no redness, swelling or lumps in the area. There is no discoloration to the nipple to indicate vasospasms.   Will talk with a provider and give her a call back.

## 2018-10-29 NOTE — Telephone Encounter (Signed)
Spoke with Dr. Vergie Living about pt's breast pain. He suggested that pt come in for exam to rule out underlying issues.   Pt says she is using the ice pack for 10-15 minutes BID. Enc her to take Ibuprofen if she wants and to schedule appt once she is contacted.   Pt verbalized understanding.

## 2018-11-03 ENCOUNTER — Ambulatory Visit: Payer: Self-pay | Admitting: Obstetrics and Gynecology

## 2018-11-05 ENCOUNTER — Other Ambulatory Visit: Payer: Self-pay

## 2018-11-05 ENCOUNTER — Encounter: Payer: Self-pay | Admitting: Obstetrics and Gynecology

## 2018-11-05 ENCOUNTER — Ambulatory Visit (INDEPENDENT_AMBULATORY_CARE_PROVIDER_SITE_OTHER): Payer: Medicaid Other | Admitting: Obstetrics and Gynecology

## 2018-11-05 VITALS — BP 107/79 | HR 96 | Ht 69.0 in | Wt 225.0 lb

## 2018-11-05 DIAGNOSIS — N644 Mastodynia: Secondary | ICD-10-CM

## 2018-11-05 NOTE — Progress Notes (Signed)
First lmp and started a week before Ring poking Non radiating.  Stopped about a month ago feeding and pumping  Obstetrics and Gynecology Visit Return Patient Evaluation  Appointment Date: 11/05/2018  Primary Care Provider: Jake [name], Christie S  [facility]  Chief Complaint: right breast mastalgia.   History of Present Illness:  Christie [name] is a 33 y.o. with the above CC. PMHx significant for DM1, 2/13 c/s and btl.  Patient stopped breast feeding and pumping about a month ago due to issues (swollen, pumping issues) with it, particularly on the right side and said that her current s/s started about a week before her first menstrual cycle after delivery which was about a two weeks ago and s/s intermittently continue. It is circular around the right areola and feels like stabbing pain. No constitutional s/s, lumps, bumps or nipple d/c.    Review of Systems: as noted in the History of Present Illness.   Patient Active Problem List   Diagnosis Date Noted  . Bilateral lower extremity edema 08/24/2018  . Poorly controlled diabetes mellitus (HCC) 08/18/2018  . Anxiety 08/12/2018  . Hyperglycemia 08/12/2018  . Polyhydramnios in third trimester, antepartum complication 07/21/2018  . Type 1 diabetes mellitus in pregnancy 07/12/2018  . DKA (diabetic ketoacidoses) (HCC) 07/11/2018  . 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 Preexisting diabetes complicating pregnancy, antepartum 02/11/2018  . Supervision of high-risk pregnancy 02/11/2018  . History of loop electrical excision procedure (LEEP) 09/23/2017  . Back pain 04/27/2016  . Abnormal Papanicolaou smear of cervix with positive human papilloma virus (HPV) test 09/02/2015  . Adjustment disorder with depressed mood 08/03/2015  . Asthma, mild intermittent 07/06/2013  . Type I diabetes mellitus with complication, uncontrolled (HCC) 06/18/2011  . History of migraine during pregnancy 06/18/2011  . Migraine  06/18/2011   Medications:  Christie [name] had no medications administered during this visit. Current Outpatient Medications  Medication Sig Dispense Refill  . albuterol (PROVENTIL HFA;VENTOLIN HFA) 108 (90 Base) MCG/ACT inhaler Inhale 1-2 puffs into the lungs every 6 (six) hours as needed for wheezing or shortness of breath. 1 Inhaler 0  . busPIRone (BUSPAR) 10 MG tablet Take 10 mg by mouth 2 (two) times daily.    Marland Kitchen. glucagon (GLUCAGON EMERGENCY) 1 MG injection Inject 1 mg into the muscle once as needed. 1 each 12  . ibuprofen (ADVIL,MOTRIN) 600 MG tablet Take 1 tablet (600 mg total) by mouth every 6 (six) hours as needed for mild pain or cramping. 30 tablet 0  . insulin aspart (NOVOLOG) 100 UNIT/ML injection Inject 25 Units into the skin 3 (three) times daily before meals. Patient will need an appointment for further refills. (Patient taking differently: Inject 0-84 Units into the skin daily. Insulin pump) 10 mL 1  . Insulin Human (INSULIN PUMP) SOLN Inject 1 each into the skin continuous. Novolog    . Insulin Pen Needle 31G X 8 MM MISC As needed for insulin injections 100 each 1   No current facility-administered medications for this visit.     Allergies: is allergic to banana; citrus; and adhesive [tape].  Physical Exam:  BP 107/79   Pulse 96   Ht 5\' 9"  (1.753 m)   Wt 225 lb (102.1 kg)   LMP 10/28/2018   Breastfeeding No   BMI 33.23 kg/m  Body mass index is 33.23 kg/m. General appearance: Well nourished, well developed female in no acute distress.  Breast: normal exam on inspection and palpation bilaterally Neuro/Psych:  Normal  mood and affect.     Assessment: right breast mastalgia. Exam benign  Plan:  1. Mastalgia Unsure etiology. ?related to resumption of menses. RTC in 44m if s/s are stable. Pt told if notices any skin changes, lumps or worsening s/s in the interim to let us know and can do a breast u/s. If s/s are stable at 85m, recommend breast u/s. If s/s go away  but come back in a cyclical fashion to her menses, likely related to that and nothing further to do   RTC: 49m and prn  Cornelia Copa MD Attending Center for [facility])

## 2018-11-06 DIAGNOSIS — N644 Mastodynia: Secondary | ICD-10-CM | POA: Insufficient documentation

## 2018-12-10 ENCOUNTER — Other Ambulatory Visit: Payer: Self-pay

## 2018-12-10 DIAGNOSIS — R0602 Shortness of breath: Secondary | ICD-10-CM

## 2018-12-10 MED ORDER — ALBUTEROL SULFATE HFA 108 (90 BASE) MCG/ACT IN AERS: 1.0000 | INHALATION_SPRAY | Freq: Four times a day (QID) | RESPIRATORY_TRACT | 1 refills | Status: DC | PRN

## 2018-12-10 NOTE — Progress Notes (Signed)
[facility] sent in refill authrization refilled inhaler and lvm to patient to find a pcp to handel future needs.

## 2019-04-23 ENCOUNTER — Ambulatory Visit (INDEPENDENT_AMBULATORY_CARE_PROVIDER_SITE_OTHER): Payer: Medicaid Other | Admitting: Primary Care

## 2019-04-29 ENCOUNTER — Encounter: Payer: Self-pay | Admitting: Family Medicine

## 2019-04-29 ENCOUNTER — Ambulatory Visit: Payer: Medicaid Other | Admitting: Family Medicine

## 2019-04-29 NOTE — Progress Notes (Signed)
Patient did not keep appointment today. She may call to reschedule.  

## 2019-05-04 ENCOUNTER — Ambulatory Visit (INDEPENDENT_AMBULATORY_CARE_PROVIDER_SITE_OTHER): Payer: Medicaid Other | Admitting: Primary Care

## 2019-06-01 ENCOUNTER — Encounter (HOSPITAL_BASED_OUTPATIENT_CLINIC_OR_DEPARTMENT_OTHER): Payer: Self-pay

## 2019-06-01 ENCOUNTER — Emergency Department (HOSPITAL_BASED_OUTPATIENT_CLINIC_OR_DEPARTMENT_OTHER)
Admission: EM | Admit: 2019-06-01 | Discharge: 2019-06-01 | Disposition: A | Discharge status: Home / Self Care | Payer: Medicaid Other | Attending: Emergency Medicine | Admitting: Emergency Medicine

## 2019-06-01 ENCOUNTER — Ambulatory Visit (INDEPENDENT_AMBULATORY_CARE_PROVIDER_SITE_OTHER): Payer: Medicaid Other | Admitting: Primary Care

## 2019-06-01 ENCOUNTER — Encounter (INDEPENDENT_AMBULATORY_CARE_PROVIDER_SITE_OTHER): Payer: Self-pay | Admitting: Primary Care

## 2019-06-01 ENCOUNTER — Other Ambulatory Visit: Payer: Self-pay

## 2019-06-01 VITALS — BP 116/72 | HR 74 | Temp 97.5°F | Ht 69.0 in | Wt 232.0 lb

## 2019-06-01 DIAGNOSIS — E1165 Type 2 diabetes mellitus with hyperglycemia: Secondary | ICD-10-CM

## 2019-06-01 DIAGNOSIS — Z760 Encounter for issue of repeat prescription: Secondary | ICD-10-CM

## 2019-06-01 DIAGNOSIS — R739 Hyperglycemia, unspecified: Secondary | ICD-10-CM

## 2019-06-01 DIAGNOSIS — Z79899 Other long term (current) drug therapy: Secondary | ICD-10-CM | POA: Insufficient documentation

## 2019-06-01 DIAGNOSIS — E1065 Type 1 diabetes mellitus with hyperglycemia: Secondary | ICD-10-CM | POA: Diagnosis not present

## 2019-06-01 DIAGNOSIS — IMO0002 Reserved for concepts with insufficient information to code with codable children: Secondary | ICD-10-CM

## 2019-06-01 LAB — COMPREHENSIVE METABOLIC PANEL
ALT: 17 U/L (ref 0–44)
AST: 20 U/L (ref 15–41)
Albumin: 3.8 g/dL (ref 3.5–5.0)
Alkaline Phosphatase: 75 U/L (ref 38–126)
Anion gap: 13 (ref 5–15)
BUN: 15 mg/dL (ref 6–20)
CO2: 23 mmol/L (ref 22–32)
Calcium: 8.8 mg/dL — ABNORMAL LOW (ref 8.9–10.3)
Chloride: 90 mmol/L — ABNORMAL LOW (ref 98–111)
Creatinine, Ser: 1.08 mg/dL — ABNORMAL HIGH (ref 0.44–1.00)
GFR calc Af Amer: >60 mL/min (ref >60)
GFR calc non Af Amer: >60 mL/min (ref >60)
Glucose, Bld: 646 mg/dL (ref 70–99)
Potassium: 4.5 mmol/L (ref 3.5–5.1)
Sodium: 126 mmol/L — ABNORMAL LOW (ref 135–145)
Total Bilirubin: 1.1 mg/dL (ref 0.3–1.2)
Total Protein: 7.3 g/dL (ref 6.5–8.1)

## 2019-06-01 LAB — CBG MONITORING, ED
Glucose-Capillary: >600 mg/dL (ref 70–99)
Glucose-Capillary: 575 mg/dL (ref 70–99)
Glucose-Capillary: 434 mg/dL — ABNORMAL HIGH (ref 70–99)
Glucose-Capillary: 395 mg/dL — ABNORMAL HIGH (ref 70–99)

## 2019-06-01 LAB — POCT URINALYSIS DIP (CLINITEK)
Bilirubin, UA: NEGATIVE
Blood, UA: NEGATIVE
Glucose, UA: 500 mg/dL — AB
Leukocytes, UA: NEGATIVE
Nitrite, UA: NEGATIVE
POC PROTEIN,UA: NEGATIVE
Spec Grav, UA: <=1.005 — AB (ref 1.010–1.025)
Urobilinogen, UA: 0.2 E.U./dL
pH, UA: 5.5 (ref 5.0–8.0)

## 2019-06-01 LAB — URINALYSIS, MICROSCOPIC (REFLEX)

## 2019-06-01 LAB — CBC WITH DIFFERENTIAL/PLATELET
Abs Immature Granulocytes: 0.01 10*3/uL (ref 0.00–0.07)
Basophils Absolute: 0 10*3/uL (ref 0.0–0.1)
Basophils Relative: 0 %
Eosinophils Absolute: 0 10*3/uL (ref 0.0–0.5)
Eosinophils Relative: 0 %
HCT: 37.4 % (ref 36.0–46.0)
Hemoglobin: 11.7 g/dL — ABNORMAL LOW (ref 12.0–15.0)
Immature Granulocytes: 0 %
Lymphocytes Relative: 35 %
Lymphs Abs: 2.6 10*3/uL (ref 0.7–4.0)
MCH: 23.2 pg — ABNORMAL LOW (ref 26.0–34.0)
MCHC: 31.3 g/dL (ref 30.0–36.0)
MCV: 74.2 fL — ABNORMAL LOW (ref 80.0–100.0)
Monocytes Absolute: 0.5 10*3/uL (ref 0.1–1.0)
Monocytes Relative: 7 %
Neutro Abs: 4.3 10*3/uL (ref 1.7–7.7)
Neutrophils Relative %: 58 %
Platelets: 228 10*3/uL (ref 150–400)
RBC: 5.04 MIL/uL (ref 3.87–5.11)
RDW: 16.2 % — ABNORMAL HIGH (ref 11.5–15.5)
WBC: 7.4 10*3/uL (ref 4.0–10.5)
nRBC: 0 % (ref 0.0–0.2)

## 2019-06-01 LAB — POCT GLYCOSYLATED HEMOGLOBIN (HGB A1C): Hemoglobin A1C: 12.9 % — AB (ref 4.0–5.6)

## 2019-06-01 LAB — POCT I-STAT EG7
Bicarbonate: 26.4 mmol/L (ref 20.0–28.0)
Calcium, Ion: 1.19 mmol/L (ref 1.15–1.40)
HCT: 40 % (ref 36.0–46.0)
Hemoglobin: 13.6 g/dL (ref 12.0–15.0)
O2 Saturation: 73 %
Patient temperature: 98.9
Potassium: 4.6 mmol/L (ref 3.5–5.1)
Sodium: 127 mmol/L — ABNORMAL LOW (ref 135–145)
TCO2: 28 mmol/L (ref 22–32)
pCO2, Ven: 49.5 mmHg (ref 44.0–60.0)
pH, Ven: 7.335 (ref 7.250–7.430)
pO2, Ven: 42 mmHg (ref 32.0–45.0)

## 2019-06-01 LAB — URINALYSIS, ROUTINE W REFLEX MICROSCOPIC
Bilirubin Urine: NEGATIVE
Glucose, UA: >=500 mg/dL — AB
Hgb urine dipstick: NEGATIVE
Ketones, ur: 15 mg/dL — AB
Leukocytes,Ua: NEGATIVE
Nitrite: NEGATIVE
Protein, ur: NEGATIVE mg/dL
Specific Gravity, Urine: <1.005 — ABNORMAL LOW (ref 1.005–1.030)
pH: 6 (ref 5.0–8.0)

## 2019-06-01 LAB — PREGNANCY, URINE: Preg Test, Ur: NEGATIVE

## 2019-06-01 MED ORDER — INSULIN ASPART 100 UNIT/ML ~~LOC~~ SOLN: 25.0000 [IU] | Freq: Three times a day (TID) | SUBCUTANEOUS | 1 refills | Status: DC

## 2019-06-01 MED ORDER — SODIUM CHLORIDE 0.9 % IV BOLUS
1000.0000 mL | Freq: Once | INTRAVENOUS | Status: AC
  Administered 2019-06-01: 1000 mL via INTRAVENOUS

## 2019-06-01 MED ORDER — INSULIN REGULAR HUMAN 100 UNIT/ML IJ SOLN
5.0000 [IU] | Freq: Once | INTRAMUSCULAR | Status: AC
  Administered 2019-06-01: 5 [IU] via INTRAVENOUS
  Filled 2019-06-01: qty 1

## 2019-06-01 MED ORDER — INSULIN REGULAR HUMAN 100 UNIT/ML IJ SOLN
10.0000 [IU] | Freq: Once | INTRAMUSCULAR | Status: AC
  Administered 2019-06-01: 10 [IU] via INTRAVENOUS
  Filled 2019-06-01: qty 1

## 2019-06-01 MED ORDER — SODIUM CHLORIDE 0.9 % IV BOLUS
1000.0000 mL | Freq: Once | INTRAVENOUS | Status: AC
  Administered 2019-06-01: 15:00:00 1000 mL via INTRAVENOUS

## 2019-06-01 NOTE — ED Notes (Signed)
RN Vincente Liberty informed of CBG

## 2019-06-01 NOTE — ED Provider Notes (Signed)
Care assumed from Dr. Gilford Raid while awaiting results of repeat glucose testing after insulin and fluids.  Plan of care was to discharge patient when glucoses were in or below the 300s.  She is not in DKA on labs.  Patient was feeling well and glucose returned in the 400s.  She was given more fluids.  Had been return in 300s.  Patient does not want to wait longer but she is amenable to getting 1 more dose of insulin on her way out.  Patient does not want to wait to see what her glucose stabilizes that but she will follow-up as an outpatient and will manage her sugars at home.  She understands return precautions and follow-up instructions.  She understands risks of fluctuating glucose and leaving while it is still in the 300s.  She has no other questions or concerns and wants to be discharged.  Patient discharged in good condition with family.    Clinical Impression: 1. Poorly controlled diabetes mellitus ([place])   2. Type I diabetes mellitus with complication, uncontrolled ([place])   3. Medication refill     Disposition: Discharge  Condition: Good  I have discussed the results, Dx and Tx plan with the pt(& family if present). He/she/they expressed understanding and agree(s) with the plan. Discharge instructions discussed at great length. Strict return precautions discussed and pt &/or family have verbalized understanding of the instructions. No further questions at time of discharge.    Current Discharge Medication List      Follow Up: Kerin Perna, NP [id number] [place] [id number] [phone]  Schedule an appointment as soon as possible for a visit in 2 days As needed       Tegeler, Gwenyth Allegra, MD 06/01/19 Vernelle Emerald

## 2019-06-01 NOTE — Discharge Instructions (Addendum)
Please follow-up with your primary doctor and continue managing your glucose at home.  If any symptoms change or worsen or you start feeling like your glucose too high or too low, please return to the nearest emergency department.

## 2019-06-01 NOTE — ED Provider Notes (Addendum)
[facility] EMERGENCY DEPARTMENT Provider Note   CSN: [id number] Arrival date & time: 06/01/19  1237     History   Chief Complaint Chief Complaint  Patient presents with  . Hyperglycemia    HPI Christie [name] is a 33 y.o. female.     Pt presents to the ED today with elevated blood sugar.  Pt has a hx of DM1 and is out of her insulin.  The pt was under the care of an endocrinologist and had an insulin pump.  However, she became pregnant.  Pt said the endocrinologist wanted the obgyns to manage her blood sugar.  The obgyns managed her blood sugar while she was pregnant.  She delivered in Feb.  She was given refills on her meds, but they are out.  Pt has been trying to get into a pcp so she can get a new referral to endocrinology.  She finally went to that appointment today.  Her blood sugar was high and she had ketones in her urine, so she was sent to the ED.  Pt said she feels fine other than frequent urination.     Past Medical History:  Diagnosis Date  . Abnormal Pap smear    colpo scheduled  . Alpha thalassemia trait   . Anemia   . Anxiety   . Asthma   . Bipolar 1 disorder (HCC)   . Chlamydia   . Depression   . Diabetes mellitus    type 1 on insulin  . Fx ankle    history of left ankle fx at age 33  . Headache(784.0)   . Hx of migraines   . Pregnancy induced hypertension   . Urinary tract infection     Patient Active Problem List   Diagnosis Date Noted  . Mastalgia 11/06/2018  . Bilateral lower extremity edema 08/24/2018  . Poorly controlled diabetes mellitus (HCC) 08/18/2018  . Anxiety 08/12/2018  . Hyperglycemia 08/12/2018  . DKA (diabetic ketoacidoses) (HCC) 07/11/2018  . 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 History of loop electrical excision procedure (LEEP) 09/23/2017  . Back pain 04/27/2016  . Abnormal Papanicolaou smear of cervix with positive human papilloma virus (HPV) test 09/02/2015  . Adjustment disorder with depressed mood  08/03/2015  . Asthma, mild intermittent 07/06/2013  . Type I diabetes mellitus with complication, uncontrolled (HCC) 06/18/2011  . History of migraine during pregnancy 06/18/2011  . Migraine 06/18/2011    Past Surgical History:  Procedure Laterality Date  . CESAREAN SECTION Bilateral 08/21/2018   Procedure: CESAREAN SECTION WITH BILATERAL TUBAL LIGATION;  Surgeon: Conan Bowens, MD;  Location: [facility] BIRTHING SU[facility];  Service: Obstetrics;  Laterality: Bilateral;  . COLPOSCOPY    . 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 WISDOM TOOTH EXTRACTION       OB History    Gravida  4   Para  3   Term  3   Preterm  0   AB  1   Living  3     SAB  0   TAB  1   Ectopic  0   Multiple  0   Live Births  3            Home Medications    Prior to Admission medications   Medication Sig Start Date End Date Taking? Authorizing Provider  albuterol (VENTOLIN HFA) 108 (90 Base) MCG/ACT inhaler Inhale 1-2 puffs into the lungs every 6 (six) hours as needed for wheezing or shortness of  breath. 12/10/18   Chalmers BingPickens, Charlie, MD  insulin aspart (NOVOLOG) 100 UNIT/ML injection Inject 25 Units into the skin 3 (three) times daily before meals. 06/01/19   Jacalyn LefevreHaviland, Dashiel Bergquist, MD  Insulin Human (INSULIN PUMP) SOLN Inject 1 each into the skin continuous. Novolog    [provider]  Insulin Pen Needle 31G X 8 MM MISC As needed for insulin injections 05/17/15   Judeth HornLawrence, Erin, NP    Family History Family History  Problem Relation Age of Onset  . Asthma Mother   . Diabetes Mother   . Hypertension Mother   . Asthma Father   . Hypertension Sister   . Diabetes Maternal Grandmother   . Cancer Maternal Grandmother        breast  . Obesity Other   . Sleep apnea Other   . Anesthesia problems Neg Hx     Social History Social History   Tobacco Use  . Smoking status: Never Smoker  . Smokeless tobacco: Never Used  Substance Use Topics  . Alcohol use: Yes    Comment: occ  . Drug use: No     Comment: last used 1 years ago     Allergies   Banana, Citrus, and Adhesive [tape]   Review of Systems Review of Systems  Endocrine: Positive for polyuria.       Elevated blood sugar  All other systems reviewed and are negative.    Physical Exam Updated Vital Signs BP 103/66   Pulse 73   Temp 98.9 F (37.2 C) (Oral)   Resp 20   LMP 05/21/2019 (Exact Date)   SpO2 100%   Physical Exam Vitals signs and nursing note reviewed.  Constitutional:      Appearance: Normal appearance. She is obese.  HENT:     Head: Normocephalic and atraumatic.     Right Ear: External ear normal.     Left Ear: External ear normal.     Nose: Nose normal.     Mouth/Throat:     Mouth: Mucous membranes are dry.  Eyes:     Extraocular Movements: Extraocular movements intact.     Conjunctiva/sclera: Conjunctivae normal.     Pupils: Pupils are equal, round, and reactive to light.  Neck:     Musculoskeletal: Normal range of motion and neck supple.  Cardiovascular:     Rate and Rhythm: Normal rate and regular rhythm.     Pulses: Normal pulses.     Heart sounds: Normal heart sounds.  Pulmonary:     Effort: Pulmonary effort is normal.     Breath sounds: Normal breath sounds.  Abdominal:     General: Abdomen is flat. Bowel sounds are normal.     Palpations: Abdomen is soft.  Musculoskeletal: Normal range of motion.  Skin:    General: Skin is warm.     Capillary Refill: Capillary refill takes less than 2 seconds.  Neurological:     General: No focal deficit present.     Mental Status: She is alert and oriented to person, place, and time.  Psychiatric:        Mood and Affect: Mood normal.        Behavior: Behavior normal.        Thought Content: Thought content normal.        Judgment: Judgment normal.      ED Treatments / Results  Labs (all labs ordered are listed, but only abnormal results are displayed) Labs Reviewed  COMPREHENSIVE METABOLIC PANEL - Abnormal; Notable for the  following components:      Result Value   Sodium 126 (*)    Chloride 90 (*)    Glucose, Bld 646 (*)    Creatinine, Ser 1.08 (*)    Calcium 8.8 (*)    All other components within normal limits  CBC WITH DIFFERENTIAL/PLATELET - Abnormal; Notable for the following components:   Hemoglobin 11.7 (*)    MCV 74.2 (*)    MCH 23.2 (*)    RDW 16.2 (*)    All other components within normal limits  URINALYSIS, ROUTINE W REFLEX MICROSCOPIC - Abnormal; Notable for the following components:   Specific Gravity, Urine <1.005 (*)    Glucose, UA >=500 (*)    Ketones, ur 15 (*)    All other components within normal limits  URINALYSIS, MICROSCOPIC (REFLEX) - Abnormal; Notable for the following components:   Bacteria, UA RARE (*)    All other components within normal limits  CBG MONITORING, ED - Abnormal; Notable for the following components:   Glucose-Capillary >600 (*)    All other components within normal limits  CBG MONITORING, ED - Abnormal; Notable for the following components:   Glucose-Capillary 575 (*)    All other components within normal limits  POCT I-STAT EG7 - Abnormal; Notable for the following components:   Sodium 127 (*)    All other components within normal limits  PREGNANCY, URINE  I-STAT VENOUS BLOOD GAS, ED  CBG MONITORING, ED  CBG MONITORING, ED    EKG EKG Interpretation  Date/Time:  Monday June 01 2019 13:45:17 EST Ventricular Rate:  70 PR Interval:    QRS Duration: 91 QT Interval:  426 QTC Calculation: 460 R Axis:   79 Text Interpretation: Sinus rhythm ST elev, probable normal early repol pattern No significant change since last tracing Confirmed by Isla Pence [phone]) on 06/01/2019 1:55:41 PM   Radiology No results found.  Procedures Procedures (including critical care time)  Medications Ordered in ED Medications  sodium chloride 0.9 % bolus 1,000 mL (1,000 mLs Intravenous New Bag/Given 06/01/19 1437)  insulin regular (NOVOLIN R) 100 units/mL  injection 10 Units (10 Units Intravenous Given 06/01/19 1443)     Initial Impression / Assessment and Plan / ED Course  I have reviewed the triage vital signs and the nursing notes.  Pertinent labs & imaging results that were available during my care of the patient were reviewed by me and considered in my medical decision making (see chart for details).       Pt is not in DKA.  She is dehydrated from her blood sugar being elevated.  I'd like to get pt's bs down below 300, so she will be signed out to Dr. Gustavus Messing at shift change.  CRITICAL CARE Performed by: Isla Pence   Total critical care time: 30 minutes  Critical care time was exclusive of separately billable procedures and treating other patients.  Critical care was necessary to treat or prevent imminent or life-threatening deterioration.  Critical care was time spent personally by me on the following activities: development of treatment plan with patient and/or surrogate as well as nursing, discussions with consultants, evaluation of patient's response to treatment, examination of patient, obtaining history from patient or surrogate, ordering and performing treatments and interventions, ordering and review of laboratory studies, ordering and review of radiographic studies, pulse oximetry and re-evaluation of patient's condition.  I spoke with pharmacy about her insulin.  She wants to go back on the pump, but since she is not seeing  endocrinology, we will hold off on the pump.  For now, we will do her novolog injection dose as given to her back in Feb by Obgyn.  The pt said she has enough strips and testing materials.  Pt is stable for d/c.  Return if worse.  F/u with pcp.  Final Clinical Impressions(s) / ED Diagnoses   Final diagnoses:  Poorly controlled diabetes mellitus (HCC)  Medication refill    ED Discharge Orders         Ordered    insulin aspart (NOVOLOG) 100 UNIT/ML injection  3 times daily before meals      06/01/19 1542           Jacalyn Lefevre, MD 06/01/19 1546    Jacalyn Lefevre, MD 06/15/19 [phone]

## 2019-06-01 NOTE — Progress Notes (Signed)
Established Patient Office Visit  Subjective:  Patient ID: Christie [name], female    DOB: [birth year]  Age: 33 y.o. MRN: [id number]  CC:  Chief Complaint  Patient presents with  . New Patient (Initial Visit)    diabetes     HPI Christie [name] presents for establishment of care she was referred by GYN she had gestational diabetes now needs to be managed for her diabetes.  Originally was treating in office  has drank 64 oz of water. A1C 12.9 and CGB unable to read and urine is positive for ketones. Called EMS to transport to ED for treatment.  Past Medical History:  Diagnosis Date  . Abnormal Pap smear    colpo scheduled  . Alpha thalassemia trait   . Anemia   . Anxiety   . Asthma   . Bipolar 1 disorder ([place])   . Chlamydia   . Depression   . Diabetes mellitus    type 1 on insulin  . Fx ankle    history of left ankle fx at age 68  . Headache(784.0)   . Hx of migraines   . Pregnancy induced hypertension   . Urinary tract infection     Past Surgical History:  Procedure Laterality Date  . CESAREAN SECTION Bilateral 08/21/2018   Procedure: CESAREAN SECTION WITH BILATERAL TUBAL LIGATION;  Surgeon: Sloan Leiter, MD;  Location: [facility];  Service: Obstetrics;  Laterality: Bilateral;  . COLPOSCOPY    . 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 WISDOM TOOTH EXTRACTION      Family History  Problem Relation Age of Onset  . Asthma Mother   . Diabetes Mother   . Hypertension Mother   . Asthma Father   . Hypertension Sister   . Diabetes Maternal Grandmother   . Cancer Maternal Grandmother        breast  . Obesity Other   . Sleep apnea Other   . Anesthesia problems Neg Hx     Social History   Socioeconomic History  . Marital status: Single    Spouse name: Not on file  . Number of children: Not on file  . Years of education: Not on file  . Highest education level: Not on file  Occupational History  . Not on file  Social Needs  . Financial resource  strain: Not on file  . Food insecurity    Worry: Never true    Inability: Never true  . Transportation needs    Medical: No    Non-medical: No  Tobacco Use  . Smoking status: Never Smoker  . Smokeless tobacco: Never Used  Substance and Sexual Activity  . Alcohol use: Yes    Comment: occ  . Drug use: No    Comment: last used 1 years ago  . Sexual activity: Not on file  Lifestyle  . Physical activity    Days per week: Not on file    Minutes per session: Not on file  . Stress: Not on file  Relationships  . Social Herbalist on phone: Not on file    Gets together: Not on file    Attends religious service: Not on file    Active member of club or organization: Not on file    Attends meetings of clubs or organizations: Not on file    Relationship status: Not on file  . Intimate partner violence    Fear of current or ex partner: Not on  file    Emotionally abused: Not on file    Physically abused: Not on file    Forced sexual activity: Not on file  Other Topics Concern  . Not on file  Social History Narrative  . Not on file    Outpatient Medications Prior to Visit  Medication Sig Dispense Refill  . albuterol (VENTOLIN HFA) 108 (90 Base) MCG/ACT inhaler Inhale 1-2 puffs into the lungs every 6 (six) hours as needed for wheezing or shortness of breath. 1 Inhaler 1  . Insulin Human (INSULIN PUMP) SOLN Inject 1 each into the skin continuous. Novolog    . Insulin Pen Needle 31G X 8 MM MISC As needed for insulin injections 100 each 1  . busPIRone (BUSPAR) 10 MG tablet Take 10 mg by mouth 2 (two) times daily.    Marland Kitchen. glucagon (GLUCAGON EMERGENCY) 1 MG injection Inject 1 mg into the muscle once as needed. 1 each 12  . ibuprofen (ADVIL,MOTRIN) 600 MG tablet Take 1 tablet (600 mg total) by mouth every 6 (six) hours as needed for mild pain or cramping. 30 tablet 0  . insulin aspart (NOVOLOG) 100 UNIT/ML injection Inject 25 Units into the skin 3 (three) times daily before meals.  Patient will need an appointment for further refills. (Patient not taking: Reported on 06/01/2019) 10 mL 1   No facility-administered medications prior to visit.     Allergies  Allergen Reactions  . Banana Anaphylaxis  . Citrus Other (See Comments)    Gums bleed  . Adhesive [Tape] Rash    ROS Review of Systems  Endocrine: Positive for polydipsia, polyphagia and polyuria.  Genitourinary: Positive for urgency.  All other systems reviewed and are negative.     Objective:    Physical Exam  Constitutional: She is oriented to person, place, and time. She appears well-developed and well-nourished.  Neck: Neck supple.  Cardiovascular: Normal rate and regular rhythm.  Pulmonary/Chest: Effort normal and breath sounds normal.  Abdominal: Soft. Bowel sounds are normal.  Musculoskeletal: Normal range of motion.  Neurological: She is oriented to person, place, and time.  Psychiatric: She has a normal mood and affect. Her behavior is normal. Thought content normal.    BP 116/72 (BP Location: Left Arm, Patient Position: Sitting, Cuff Size: Large)   Pulse 74   Temp (!) 97.5 F (36.4 C) (Temporal)   Ht 5\' 9"  (1.753 m)   Wt 232 lb (105.2 kg)   LMP 05/21/2019 (Exact Date)   SpO2 96%   Breastfeeding No   BMI 34.26 kg/m  Wt Readings from Last 3 Encounters:  06/01/19 232 lb (105.2 kg)  11/05/18 225 lb (102.1 kg)  09/19/18 221 lb (100.2 kg)     Health Maintenance Due  Topic Date Due  . PNEUMOCOCCAL POLYSACCHARIDE VACCINE AGE 69-64 HIGH RISK  03/26/1990  . FOOT EXAM  03/26/1998  . OPHTHALMOLOGY EXAM  01/30/2015  . URINE MICROALBUMIN  10/19/2016  . INFLUENZA VACCINE  02/07/2019    There are no preventive care reminders to display for this patient.  Lab Results  Component Value Date   TSH 3.420 02/11/2018   Lab Results  Component Value Date   WBC 7.4 06/01/2019   HGB 13.6 06/01/2019   HCT 40.0 06/01/2019   MCV 74.2 (L) 06/01/2019   PLT 228 06/01/2019   Lab Results   Component Value Date   NA 127 (L) 06/01/2019   K 4.6 06/01/2019   CO2 23 06/01/2019   GLUCOSE 646 (HH) 06/01/2019  BUN 15 06/01/2019   CREATININE 1.08 (H) 06/01/2019   BILITOT 1.1 06/01/2019   ALKPHOS 75 06/01/2019   AST 20 06/01/2019   ALT 17 06/01/2019   PROT 7.3 06/01/2019   ALBUMIN 3.8 06/01/2019   CALCIUM 8.8 (L) 06/01/2019   ANIONGAP 13 06/01/2019   No results found for: CHOL No results found for: HDL No results found for: LDLCALC No results found for: TRIG No results found for: CHOLHDL Lab Results  Component Value Date   HGBA1C 12.9 (A) 06/01/2019      Assessment & Plan:   Problem List Items Addressed This Visit    Type I diabetes mellitus with complication, uncontrolled (HCC) - Primary (Chronic)   Relevant Orders   HgB A1c (Completed)   Glucose (CBG)   Hyperglycemia   Relevant Orders   POCT URINALYSIS DIP (CLINITEK) (Completed)     Sent to ED for treatment. No orders of the defined types were placed in this encounter.   Follow-up:   After ED follow up  Grayce Sessions, NP

## 2019-06-01 NOTE — ED Triage Notes (Addendum)
Pt states she is Type I DM-she has been out of insulin x 2 months due to no PCP-new pt PCP visit PTA and sent to ED for BS "high" and ketones in urine-NAD-steady gait

## 2019-06-01 NOTE — ED Notes (Addendum)
CBG:434 RN aware

## 2019-06-02 ENCOUNTER — Encounter: Payer: Medicaid Other | Attending: Primary Care | Admitting: Nutrition

## 2019-06-02 MED FILL — Insulin Regular (Human) Inj 100 Unit/ML: INTRAMUSCULAR | Qty: 0.05 | Status: CN

## 2019-06-02 MED FILL — Insulin Regular (Human) Inj 100 Unit/ML: INTRAMUSCULAR | Qty: 0.1 | Status: CN

## 2019-06-02 MED FILL — Insulin Regular (Human) Inj 100 Unit/ML: INTRAMUSCULAR | Qty: 0.05 | Status: AC

## 2019-06-02 MED FILL — Insulin Regular (Human) Inj 100 Unit/ML: INTRAMUSCULAR | Qty: 0.1 | Status: AC

## 2019-06-02 NOTE — Progress Notes (Signed)
Patient is here with her new pump.  Says she is a patient of Dr. Cindra Eves, and does not know her old settings.  Her old pump is not working.  I went to Dr. Cindra Eves office.  They printed the last 2 office notes.  Last visit with him was 5/19 with low blood sugar.  Per Talking with Dr Buddy Duty, she was a no show on last 2 visits, and he has not seen her.  He also does not have any pump settings.  I also contacted [employer], but they did not have any pump settings.   I told this to the patient, and told her that her best option is to call the ER (she was there 3 days ago with high readings) to see if she can get a long acting insulin, until she can see a family doctor.

## 2019-06-17 ENCOUNTER — Other Ambulatory Visit: Payer: Self-pay

## 2019-06-17 ENCOUNTER — Encounter (INDEPENDENT_AMBULATORY_CARE_PROVIDER_SITE_OTHER): Payer: Self-pay | Admitting: Primary Care

## 2019-06-17 ENCOUNTER — Ambulatory Visit (INDEPENDENT_AMBULATORY_CARE_PROVIDER_SITE_OTHER): Payer: Medicaid Other | Admitting: Primary Care

## 2019-06-17 DIAGNOSIS — IMO0002 Reserved for concepts with insufficient information to code with codable children: Secondary | ICD-10-CM

## 2019-06-17 DIAGNOSIS — Z7689 Persons encountering health services in other specified circumstances: Secondary | ICD-10-CM

## 2019-06-17 DIAGNOSIS — E1065 Type 1 diabetes mellitus with hyperglycemia: Secondary | ICD-10-CM

## 2019-06-17 MED ORDER — INSULIN PUMP: 1.0000 | SUBCUTANEOUS | 0 refills | Status: DC

## 2019-06-17 MED ORDER — INSULIN ASPART 100 UNIT/ML ~~LOC~~ SOLN: SUBCUTANEOUS | 0 refills | Status: DC

## 2019-06-17 NOTE — Progress Notes (Signed)
Virtual Visit via Telephone Note  I connected with Christie [name] on 06/17/19 at 10:30 AM EST by telephone and verified that I am speaking with the correct person using two identifiers.   I discussed the limitations, risks, security and privacy concerns of performing an evaluation and management service by telephone and the availability of in person appointments. I also discussed with the patient that there may be a patient responsible charge related to this service. The patient expressed understanding and agreed to proceed.   History of Present Illness: Christie [name] is having a tele visit to establish care and manage type 1 diabetes. CBG ranged 121-147 fasting  200-240. Requesting insulin for her pump. Previously followed by Dr. Sharl Ma however, has been inconsistent with follow ups prior to pregnancy.   Past Medical History:  Diagnosis Date  . Abnormal Pap smear    colpo scheduled  . Alpha thalassemia trait   . Anemia   . Anxiety   . Asthma   . Bipolar 1 disorder (HCC)   . Chlamydia   . Depression   . Diabetes mellitus    type 1 on insulin  . Fx ankle    history of left ankle fx at age 78  . Headache(784.0)   . Hx of migraines   . Pregnancy induced hypertension   . Urinary tract infection    Current Outpatient Medications on File Prior to Visit  Medication Sig Dispense Refill  . albuterol (VENTOLIN HFA) 108 (90 Base) MCG/ACT inhaler Inhale 1-2 puffs into the lungs every 6 (six) hours as needed for wheezing or shortness of breath. 1 Inhaler 1  . insulin aspart (NOVOLOG) 100 UNIT/ML injection Inject 25 Units into the skin 3 (three) times daily before meals. 10 mL 1  . Insulin Human (INSULIN PUMP) SOLN Inject 1 each into the skin continuous. Novolog    . Insulin Pen Needle 31G X 8 MM MISC As needed for insulin injections 100 each 1   No current facility-administered medications on file prior to visit.    Observations/Objective: Review of Systems  All other systems  reviewed and are negative.   Assessment and Plan: Christie [name] was seen today for diabetes.  Diagnoses and all orders for this visit:  Type I diabetes mellitus with complication, uncontrolled (HCC) Patient has tried to restart insulin pump and settings are not correct. Spoke with Dr. Sharl Ma patient can be seen by the end of the month. Until seen by Dr. Sharl Ma sliding scale insulin will be prescribed. -     Ambulatory referral to Endocrinology  Encounter to establish care Christie Passe, NP-C will be your  (PCP) mastered prepared that is able to that will  diagnosed and treatment able to answer health concern as well as continuing care of varied medical conditions, not limited by cause, organ system, or diagnosis.  Other orders -     Discontinue: Insulin Human (INSULIN PUMP) SOLN; Inject 1 each into the skin continuous. Novolog -     insulin aspart (NOVOLOG) 100 UNIT/ML injection; Take after meals- 200-250 take 4 units, 251-300 take 6 units, 301-350 8 units , 351-400 10 units.   Follow Up Instructions:    I discussed the assessment and treatment plan with the patient. The patient was provided an opportunity to ask questions and all were answered. The patient agreed with the plan and demonstrated an understanding of the instructions.   The patient was advised to call back or seek an in-person evaluation if the symptoms worsen or if  the condition fails to improve as anticipated.  I provided 30 minutes of non-face-to-face time during this encounter. To include endocrinology consult   Kerin Perna, NP

## 2019-06-17 NOTE — Progress Notes (Signed)
Fasting CBG 1 hour ago was 121

## 2019-07-12 ENCOUNTER — Other Ambulatory Visit (INDEPENDENT_AMBULATORY_CARE_PROVIDER_SITE_OTHER): Payer: Self-pay | Admitting: Primary Care

## 2019-07-13 NOTE — Telephone Encounter (Signed)
FWD to PCP

## 2019-07-20 ENCOUNTER — Telehealth: Payer: Self-pay | Admitting: Nutrition

## 2019-07-22 ENCOUNTER — Encounter: Payer: Medicaid Other | Attending: Primary Care | Admitting: Nutrition

## 2019-07-22 ENCOUNTER — Other Ambulatory Visit: Payer: Self-pay

## 2019-07-22 DIAGNOSIS — E1065 Type 1 diabetes mellitus with hyperglycemia: Secondary | ICD-10-CM

## 2019-07-22 DIAGNOSIS — IMO0002 Reserved for concepts with insufficient information to code with codable children: Secondary | ICD-10-CM

## 2019-07-22 NOTE — Progress Notes (Signed)
Patient is here to restart her insulin pump.  She did not take her Lantus last night or this AM.  Per Dr. Daune Perch written orders, settings were put in by the patient with my assistance:  Basal rate: 1.8, I/C: 5, active insulin:3hr., ISF: 20, Max basal: 4.0, target: 100.  She filled a cartridge with Humalog insulin, without any assistance from me, and reported having sufficient  pump supplies at home.  She attached a Quick Set 86mm, 23 inch infusion set to her left abdomen.  She can not afford the sensors, so she is using her Bayer contour meter, that is linked to her pump.  We reviewed how to give a bolus, and how to do correction boluses as needed.  WE also reviewed the importance of testing blood sugars before meals, so the pump with correct for the high readings.   She was drinking a sweetened tea from [employer] while in the office.  I told her that she will need to put 75 carbs into her pump while drinking this and this will add to weight gain.  She did not comment on this.  She had no final questions.

## 2019-07-23 NOTE — Patient Instructions (Signed)
Test blood sugar before meals and at bedtime Put blood sugar readings into the pump before each meal, so that the pump with add insulin to the bolus to bring down blood sugar readings. Change infusion set every 3 days.

## 2019-08-10 NOTE — Telephone Encounter (Signed)
Opened in error

## 2019-12-24 ENCOUNTER — Other Ambulatory Visit: Payer: Self-pay

## 2019-12-24 ENCOUNTER — Emergency Department (HOSPITAL_BASED_OUTPATIENT_CLINIC_OR_DEPARTMENT_OTHER): Payer: Medicaid Other

## 2019-12-24 ENCOUNTER — Encounter (HOSPITAL_BASED_OUTPATIENT_CLINIC_OR_DEPARTMENT_OTHER): Payer: Self-pay | Admitting: Emergency Medicine

## 2019-12-24 ENCOUNTER — Emergency Department (HOSPITAL_BASED_OUTPATIENT_CLINIC_OR_DEPARTMENT_OTHER)
Admission: EM | Admit: 2019-12-24 | Discharge: 2019-12-24 | Disposition: A | Discharge status: Home / Self Care | Payer: Medicaid Other | Attending: Emergency Medicine | Admitting: Emergency Medicine

## 2019-12-24 DIAGNOSIS — M791 Myalgia, unspecified site: Secondary | ICD-10-CM | POA: Diagnosis not present

## 2019-12-24 DIAGNOSIS — E1010 Type 1 diabetes mellitus with ketoacidosis without coma: Secondary | ICD-10-CM | POA: Insufficient documentation

## 2019-12-24 DIAGNOSIS — Z794 Long term (current) use of insulin: Secondary | ICD-10-CM | POA: Diagnosis not present

## 2019-12-24 DIAGNOSIS — Z041 Encounter for examination and observation following transport accident: Secondary | ICD-10-CM | POA: Insufficient documentation

## 2019-12-24 DIAGNOSIS — Y9289 Other specified places as the place of occurrence of the external cause: Secondary | ICD-10-CM | POA: Insufficient documentation

## 2019-12-24 DIAGNOSIS — M545 Low back pain, unspecified: Secondary | ICD-10-CM

## 2019-12-24 DIAGNOSIS — Y929 Unspecified place or not applicable: Secondary | ICD-10-CM | POA: Diagnosis not present

## 2019-12-24 DIAGNOSIS — Y939 Activity, unspecified: Secondary | ICD-10-CM | POA: Diagnosis not present

## 2019-12-24 DIAGNOSIS — Y999 Unspecified external cause status: Secondary | ICD-10-CM | POA: Insufficient documentation

## 2019-12-24 LAB — PREGNANCY, URINE: Preg Test, Ur: NEGATIVE

## 2019-12-24 MED ORDER — IBUPROFEN 800 MG PO TABS
800.0000 mg | ORAL_TABLET | Freq: Once | ORAL | Status: AC
  Administered 2019-12-24: 800 mg via ORAL
  Filled 2019-12-24: qty 1

## 2019-12-24 MED ORDER — HYDROMORPHONE HCL 1 MG/ML IJ SOLN
1.0000 mg | Freq: Once | INTRAMUSCULAR | Status: DC
  Filled 2019-12-24: qty 1

## 2019-12-24 MED ORDER — ACETAMINOPHEN 325 MG PO TABS
650.0000 mg | ORAL_TABLET | Freq: Once | ORAL | Status: AC
  Administered 2019-12-24: 650 mg via ORAL
  Filled 2019-12-24: qty 2

## 2019-12-24 NOTE — Discharge Instructions (Signed)
Wonderful to meet you today.  You can continue to use Tylenol 650 mg every 4-6 hours.  You can also use ibuprofen 600 to 800 mg 2-3 times daily.  Ice/heat up to 20 minutes several times a day.  Follow-up if symptoms not improving in the next 1-2 weeks or sooner if worsening including any weakness, worsening numbness/tingling, any bowel/bladder difficulties.
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
## 2020-01-27 ENCOUNTER — Emergency Department (HOSPITAL_BASED_OUTPATIENT_CLINIC_OR_DEPARTMENT_OTHER)
Admission: EM | Admit: 2020-01-27 | Discharge: 2020-01-27 | Disposition: A | Discharge status: Home / Self Care | Payer: Medicaid Other | Attending: Emergency Medicine | Admitting: Emergency Medicine

## 2020-01-27 ENCOUNTER — Emergency Department (HOSPITAL_BASED_OUTPATIENT_CLINIC_OR_DEPARTMENT_OTHER): Payer: Medicaid Other

## 2020-01-27 ENCOUNTER — Encounter (HOSPITAL_BASED_OUTPATIENT_CLINIC_OR_DEPARTMENT_OTHER): Payer: Self-pay | Admitting: Emergency Medicine

## 2020-01-27 ENCOUNTER — Other Ambulatory Visit: Payer: Self-pay

## 2020-01-27 DIAGNOSIS — Z79899 Other long term (current) drug therapy: Secondary | ICD-10-CM | POA: Diagnosis not present

## 2020-01-27 DIAGNOSIS — E1065 Type 1 diabetes mellitus with hyperglycemia: Secondary | ICD-10-CM | POA: Insufficient documentation

## 2020-01-27 DIAGNOSIS — R55 Syncope and collapse: Secondary | ICD-10-CM | POA: Insufficient documentation

## 2020-01-27 DIAGNOSIS — J45909 Unspecified asthma, uncomplicated: Secondary | ICD-10-CM | POA: Insufficient documentation

## 2020-01-27 DIAGNOSIS — F4321 Adjustment disorder with depressed mood: Secondary | ICD-10-CM | POA: Insufficient documentation

## 2020-01-27 LAB — CBC WITH DIFFERENTIAL/PLATELET
Abs Immature Granulocytes: 0.01 10*3/uL (ref 0.00–0.07)
Basophils Absolute: 0 10*3/uL (ref 0.0–0.1)
Basophils Relative: 0 %
Eosinophils Absolute: 0 10*3/uL (ref 0.0–0.5)
Eosinophils Relative: 0 %
HCT: 38.3 % (ref 36.0–46.0)
Hemoglobin: 11.9 g/dL — ABNORMAL LOW (ref 12.0–15.0)
Immature Granulocytes: 0 %
Lymphocytes Relative: 34 %
Lymphs Abs: 1.9 10*3/uL (ref 0.7–4.0)
MCH: 23.7 pg — ABNORMAL LOW (ref 26.0–34.0)
MCHC: 31.1 g/dL (ref 30.0–36.0)
MCV: 76.1 fL — ABNORMAL LOW (ref 80.0–100.0)
Monocytes Absolute: 0.3 10*3/uL (ref 0.1–1.0)
Monocytes Relative: 6 %
Neutro Abs: 3.4 10*3/uL (ref 1.7–7.7)
Neutrophils Relative %: 60 %
Platelets: 206 10*3/uL (ref 150–400)
RBC: 5.03 MIL/uL (ref 3.87–5.11)
RDW: 15.1 % (ref 11.5–15.5)
WBC: 5.6 10*3/uL (ref 4.0–10.5)
nRBC: 0 % (ref 0.0–0.2)

## 2020-01-27 LAB — URINALYSIS, ROUTINE W REFLEX MICROSCOPIC
Bilirubin Urine: NEGATIVE
Glucose, UA: >=500 mg/dL — AB
Hgb urine dipstick: NEGATIVE
Ketones, ur: 40 mg/dL — AB
Leukocytes,Ua: NEGATIVE
Nitrite: NEGATIVE
Protein, ur: NEGATIVE mg/dL
Specific Gravity, Urine: 1.01 (ref 1.005–1.030)
pH: 6 (ref 5.0–8.0)

## 2020-01-27 LAB — COMPREHENSIVE METABOLIC PANEL
ALT: 14 U/L (ref 0–44)
AST: 18 U/L (ref 15–41)
Albumin: 3.6 g/dL (ref 3.5–5.0)
Alkaline Phosphatase: 70 U/L (ref 38–126)
Anion gap: 12 (ref 5–15)
BUN: 11 mg/dL (ref 6–20)
CO2: 24 mmol/L (ref 22–32)
Calcium: 8.8 mg/dL — ABNORMAL LOW (ref 8.9–10.3)
Chloride: 97 mmol/L — ABNORMAL LOW (ref 98–111)
Creatinine, Ser: 0.86 mg/dL (ref 0.44–1.00)
GFR calc Af Amer: >60 mL/min (ref >60)
GFR calc non Af Amer: >60 mL/min (ref >60)
Glucose, Bld: 332 mg/dL — ABNORMAL HIGH (ref 70–99)
Potassium: 4.2 mmol/L (ref 3.5–5.1)
Sodium: 133 mmol/L — ABNORMAL LOW (ref 135–145)
Total Bilirubin: 0.3 mg/dL (ref 0.3–1.2)
Total Protein: 7.4 g/dL (ref 6.5–8.1)

## 2020-01-27 LAB — CBG MONITORING, ED
Glucose-Capillary: 312 mg/dL — ABNORMAL HIGH (ref 70–99)
Glucose-Capillary: 283 mg/dL — ABNORMAL HIGH (ref 70–99)

## 2020-01-27 LAB — RAPID URINE DRUG SCREEN, HOSP PERFORMED
Amphetamines: NOT DETECTED
Barbiturates: NOT DETECTED
Benzodiazepines: POSITIVE — AB
Cocaine: NOT DETECTED
Opiates: NOT DETECTED
Tetrahydrocannabinol: NOT DETECTED

## 2020-01-27 LAB — URINALYSIS, MICROSCOPIC (REFLEX): WBC, UA: NONE SEEN WBC/hpf (ref 0–5)

## 2020-01-27 LAB — I-STAT VENOUS BLOOD GAS, ED
Acid-Base Excess: 3 mmol/L — ABNORMAL HIGH (ref 0.0–2.0)
Bicarbonate: 28.4 mmol/L — ABNORMAL HIGH (ref 20.0–28.0)
Calcium, Ion: 1.23 mmol/L (ref 1.15–1.40)
HCT: 40 % (ref 36.0–46.0)
Hemoglobin: 13.6 g/dL (ref 12.0–15.0)
O2 Saturation: 56 %
Potassium: 4.4 mmol/L (ref 3.5–5.1)
Sodium: 135 mmol/L (ref 135–145)
TCO2: 30 mmol/L (ref 22–32)
pCO2, Ven: 46.2 mmHg (ref 44.0–60.0)
pH, Ven: 7.396 (ref 7.250–7.430)
pO2, Ven: 30 mmHg — CL (ref 32.0–45.0)

## 2020-01-27 LAB — D-DIMER, QUANTITATIVE: D-Dimer, Quant: 0.34 ug/mL-FEU (ref 0.00–0.50)

## 2020-01-27 LAB — MAGNESIUM: Magnesium: 2 mg/dL (ref 1.7–2.4)

## 2020-01-27 LAB — ETHANOL: Alcohol, Ethyl (B): <10 mg/dL (ref <10)

## 2020-01-27 LAB — HCG, SERUM, QUALITATIVE: Preg, Serum: NEGATIVE

## 2020-01-27 LAB — TROPONIN I (HIGH SENSITIVITY): Troponin I (High Sensitivity): 3 ng/L (ref <18)

## 2020-01-27 MED ORDER — LACTATED RINGERS IV BOLUS
1000.0000 mL | Freq: Once | INTRAVENOUS | Status: AC
  Administered 2020-01-27: 1000 mL via INTRAVENOUS

## 2020-01-27 MED ORDER — INSULIN ASPART 100 UNIT/ML ~~LOC~~ SOLN: SUBCUTANEOUS | 2 refills | Status: AC

## 2020-01-27 NOTE — Discharge Instructions (Signed)
I have given you a refill on your insulin.  I would recommend that you do not drive, operate heavy machinery, or perform other potentially dangerous tasks until cleared to do so by a physician.  I suspect that you passed out today due to dehydration and emotional stress.  Please make sure that you are eating healthy, taking your insulin and drinking enough fluid.

## 2020-01-27 NOTE — ED Notes (Signed)
Pt tolerated PO water, no emesis. Ambulated independantly with standby assit.

## 2020-01-27 NOTE — ED Triage Notes (Addendum)
Arrived by [facility]. Pt had syncopal episode at home. No more than 1 min.  Fell onto carpeted floor.  No obvious trauma.  Sts recent death in family and pt has not been eating or drinking x4 days. Has not been wearing her insulin pump for the past week. Random insulin coverage. Got dizzy.  Has slight HA.

## 2020-01-27 NOTE — ED Notes (Signed)
ED Provider at bedside. 

## 2020-01-27 NOTE — ED Notes (Signed)
Orthostatic vitals pending till PT is more alert

## 2020-01-27 NOTE — ED Notes (Signed)
PT awakens to verbal. Husband at bedside. Pt very weak to perform orthostatic VS at this time.

## 2020-01-27 NOTE — ED Provider Notes (Signed)
[facility] EMERGENCY DEPARTMENT Provider Note   CSN: [id number] Arrival date & time: 01/27/20  1652     History Chief Complaint  Patient presents with  . Loss of Consciousness    Christie [name] is a 34 y.o. female with a past medical history of DM 1, bipolar 1, who presents today for evaluation after syncopal event.  History obtained from patient, EMS, and chart review.  Patient states that [place]  She has not been wearing her insulin pump for a week.  She states that it had been off and she was going to change it when she got the phone call that her father had died and since then just did not put it back on.  She states that she did take 20 units of Humalog this morning, she has not eaten since breakfast this morning at about 7 or 8 AM.  She states that between taking care of her mother, planning the funeral, and mourning her father she just has not put her insulin pump back on.    Patient was cleaning the living room when she reportedly had a syncopal event.  She states that she had been feeling lightheaded for about an hour prior to this.  She landed on carpet.  She states that she has a posterior headache since she fell and feels like she is having a hard time keeping her eyes open.  She denies any pain in her neck, chest, back arms or legs.    She is tearful, stating that she needs to be taking care of her mother.  HPI     Past Medical History:  Diagnosis Date  . Abnormal Pap smear    colpo scheduled  . Alpha thalassemia trait   . Anemia   . Anxiety   . Asthma   . Bipolar 1 disorder (HCC)   . Chlamydia   . Depression   . Diabetes mellitus    type 1 on insulin  . Fx ankle    history of left ankle fx at age 34  . Headache(784.0)   . Hx of migraines   . Pregnancy induced hypertension   . Urinary tract infection     Patient Active Problem List   Diagnosis Date Noted  . Mastalgia 11/06/2018  . Bilateral lower  extremity edema 08/24/2018  . Poorly controlled diabetes mellitus (HCC) 08/18/2018  . Anxiety 08/12/2018  . Hyperglycemia 08/12/2018  . DKA (diabetic ketoacidoses) (HCC) 07/11/2018  . 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 History of loop electrical excision procedure (LEEP) 09/23/2017  . Back pain 04/27/2016  . Abnormal Papanicolaou smear of cervix with positive human papilloma virus (HPV) test 09/02/2015  . Adjustment disorder with depressed mood 08/03/2015  . Asthma, mild intermittent 07/06/2013  . Type I diabetes mellitus with complication, uncontrolled (HCC) 06/18/2011  . History of migraine during pregnancy 06/18/2011  . Migraine 06/18/2011    Past Surgical History:  Procedure Laterality Date  . CESAREAN SECTION Bilateral 08/21/2018   Procedure: CESAREAN SECTION WITH BILATERAL TUBAL LIGATION;  Surgeon: Conan Bowensavis, Kelly M, MD;  [facility];  Service: Obstetrics;  Laterality: Bilateral;  . COLPOSCOPY    . 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 TUBAL LIGATION    . WISDOM TOOTH EXTRACTION       OB History    Gravida  4   Para  3   Term  3   Preterm  0   AB  1   Living  3     SAB  0   TAB  1   Ectopic  0   Multiple  0   Live Births  3           Family History  Problem Relation Age of Onset  . Asthma Mother   . Diabetes Mother   . Hypertension Mother   . Asthma Father   . Hypertension Sister   . Diabetes Maternal Grandmother   . Cancer Maternal Grandmother        breast  . Obesity Other   . Sleep apnea Other   . Anesthesia problems Neg Hx     Social History   Tobacco Use  . Smoking status: Never Smoker  . Smokeless tobacco: Never Used  Vaping Use  . Vaping Use: Never used  Substance Use Topics  . Alcohol use: Yes    Comment: occ  . Drug use: No    Comment: last used 1 years ago    Home Medications Prior to Admission medications   Medication Sig Start Date End Date Taking? Authorizing Provider  albuterol (VENTOLIN HFA) 108  (90 Base) MCG/ACT inhaler Inhale 1-2 puffs into the lungs every 6 (six) hours as needed for wheezing or shortness of breath. 12/10/18   Poston Bing, MD  insulin aspart (NOVOLOG) 100 UNIT/ML injection TAKE AFTER MEALS- 200-250 TAKE 4 UNITS, 251-300 TAKE 6 UNITS, 301-350 8 UNITS , 351-400 10 UNITS. 01/27/20   Cristina Gong, PA-C  Insulin Pen Needle 31G X 8 MM MISC As needed for insulin injections 05/17/15   Judeth Horn, NP  LANTUS SOLOSTAR 100 UNIT/ML Solostar Pen SMARTSIG:35 Unit(s) SUB-Q Daily 09/08/19   [provider]    Allergies    Banana, Citrus, and Adhesive [tape]  Review of Systems   Review of Systems  Constitutional: Negative for chills and fever.  HENT: Negative for congestion.   Eyes: Negative for visual disturbance.  Respiratory: Negative for cough and shortness of breath.   Cardiovascular: Negative for chest pain.  Gastrointestinal: Negative for abdominal pain, nausea and vomiting.  Endocrine: Positive for polydipsia and polyuria. Negative for polyphagia.  Genitourinary: Negative for dysuria, hematuria and urgency.  Musculoskeletal: Negative for back pain and neck pain.  Neurological: Positive for syncope, light-headedness and headaches. Negative for dizziness.  Psychiatric/Behavioral: Negative for suicidal ideas.       Grief  All other systems reviewed and are negative.   Physical Exam Updated Vital Signs BP (!) 114/91   Pulse 93   Temp 98.9 F (37.2 C) (Oral)   Resp 14   Ht 5\' 9"  (1.753 m)   Wt 107 kg   LMP  (Within Weeks)   SpO2 98%   BMI 34.85 kg/m   Physical Exam Vitals and nursing note reviewed.  Constitutional:      Appearance: She is well-developed. She is ill-appearing. She is not diaphoretic.     Comments: Slightly slow to respond, quiet speech, tearful.   HENT:     Head: Normocephalic and atraumatic.     Mouth/Throat:     Mouth: Mucous membranes are moist.  Eyes:     General: No scleral icterus.       Right eye: No  discharge.        Left eye: No discharge.     Conjunctiva/sclera: Conjunctivae normal.  Cardiovascular:     Rate and Rhythm: Normal rate and regular rhythm.     Pulses: Normal pulses.  Heart sounds: Normal heart sounds.  Pulmonary:     Effort: Pulmonary effort is normal. No respiratory distress.     Breath sounds: Normal breath sounds. No stridor.  Abdominal:     General: There is no distension.     Tenderness: There is no abdominal tenderness. There is no guarding.  Musculoskeletal:        General: No deformity.     Cervical back: Normal range of motion and neck supple. No rigidity or tenderness.     Right lower leg: No edema.     Left lower leg: No edema.  Skin:    General: Skin is warm and dry.  Neurological:     General: No focal deficit present.     Mental Status: She is alert and oriented to person, place, and time.     Motor: No abnormal muscle tone.  Psychiatric:        Mood and Affect: Mood is depressed. Affect is tearful.        Behavior: Behavior is slowed.     ED Results / Procedures / Treatments   Labs (all labs ordered are listed, but only abnormal results are displayed) Labs Reviewed  URINALYSIS, ROUTINE W REFLEX MICROSCOPIC - Abnormal; Notable for the following components:      Result Value   Glucose, UA >=500 (*)    Ketones, ur 40 (*)    All other components within normal limits  COMPREHENSIVE METABOLIC PANEL - Abnormal; Notable for the following components:   Sodium 133 (*)    Chloride 97 (*)    Glucose, Bld 332 (*)    Calcium 8.8 (*)    All other components within normal limits  CBC WITH DIFFERENTIAL/PLATELET - Abnormal; Notable for the following components:   Hemoglobin 11.9 (*)    MCV 76.1 (*)    MCH 23.7 (*)    All other components within normal limits  RAPID URINE DRUG SCREEN, HOSP PERFORMED - Abnormal; Notable for the following components:   Benzodiazepines POSITIVE (*)    All other components within normal limits  URINALYSIS,  MICROSCOPIC (REFLEX) - Abnormal; Notable for the following components:   Bacteria, UA RARE (*)    All other components within normal limits  CBG MONITORING, ED - Abnormal; Notable for the following components:   Glucose-Capillary 312 (*)    All other components within normal limits  I-STAT VENOUS BLOOD GAS, ED - Abnormal; Notable for the following components:   pO2, Ven 30.0 (*)    Bicarbonate 28.4 (*)    Acid-Base Excess 3.0 (*)    All other components within normal limits  CBG MONITORING, ED - Abnormal; Notable for the following components:   Glucose-Capillary 283 (*)    All other components within normal limits  MAGNESIUM  ETHANOL  D-DIMER, QUANTITATIVE (NOT AT [facility])  HCG, SERUM, QUALITATIVE  BLOOD GAS, VENOUS  TROPONIN I (HIGH SENSITIVITY)  TROPONIN I (HIGH SENSITIVITY)   Orthostatic VS for the past 24 hrs:  BP- Lying Pulse- Lying BP- Sitting Pulse- Sitting BP- Standing at 0 minutes Pulse- Standing at 0 minutes  01/27/20 1945 122/80 86 121/86 87 (!) 125/100 93      EKG EKG Interpretation  Date/Time:  Wednesday January 27 2020 17:00:30 EDT Ventricular Rate:  94 PR Interval:    QRS Duration: 79 QT Interval:  354 QTC Calculation: 443 R Axis:   86 Text Interpretation: Sinus rhythm Nonspecific T abnrm, anterolateral leads ST elev, probable normal early repol pattern Similar to prior. No  STEMI Confirmed by Alona Bene [phone]) on 01/27/2020 5:21:23 PM   Radiology DG Chest 2 View  Result Date: 01/27/2020 CLINICAL DATA:  Syncope.  Fell onto carpeted floor. EXAM: CHEST - 2 VIEW COMPARISON:  07/08/2008 FINDINGS: Shallow lung inflation.  Heart size is normal.  Lungs are clear. IMPRESSION: Shallow inflation.  No evidence for acute  abnormality. Electronically Signed   By: Norva Pavlov M.D.   On: 01/27/2020 18:19   CT Head Wo Contrast  Result Date: 01/27/2020 CLINICAL DATA:  Syncope.  History diabetes.  No insulin for 7 days. EXAM: CT HEAD WITHOUT CONTRAST TECHNIQUE:  Contiguous axial images were obtained from the base of the skull through the vertex without intravenous contrast. COMPARISON:  None. FINDINGS: Brain: No evidence of acute infarction, hemorrhage, hydrocephalus, extra-axial collection or mass lesion/mass effect. Vascular: No hyperdense vessel or unexpected calcification. Skull: Normal. Negative for fracture or focal lesion. Sinuses/Orbits: No acute finding. Other: None. IMPRESSION: Negative exam. Electronically Signed   By: Norva Pavlov M.D.   On: 01/27/2020 18:21    Procedures Procedures (including critical care time)  Medications Ordered in ED Medications  lactated ringers bolus 1,000 mL ( Intravenous Stopped 01/27/20 1931)    ED Course  I have reviewed the triage vital signs and the nursing notes.  Pertinent labs & imaging results that were available during my care of the patient were reviewed by me and considered in my medical decision making (see chart for details).  Clinical Course as of Jan 27 13  Wed Jan 27, 2020  1738 Patient husband is present, he provides additional history.  He reports that no one in the family has been eating well.  That she does not have a primary care doctor and so often times will run out of insulin.  He does not think she is suicidal, rather just tied up with everything going on.  He did not see her fall however thinks it is very likely that she struck her head.  He states that she was out for about 10 to 12 minutes, he denies any seizure-like activity.     [EH]  2007 Patient reevaluated, she feels better now.  She is awake and alert.     [EH]    Clinical Course User Index [EH] Norman Clay   MDM Rules/Calculators/A&P                         Patient is a 34 year old woman with DM 1 who presents today for evaluation after syncopal event.  Recently she has had her father passed away and has not had her insulin pump on for a week.  She did take 20 units of insulin this morning however has  not eaten since breakfast.  She had sat down on the floor and stood up after which she had a syncopal event.  She was unconscious for about 10 to 12 minutes according to her husband without seizure activity.  She has not been eating adequately or taking insulin.  She is hyperglycemic with a blood sugar in the 300s.  VBG was obtained, PO2 was slightly low at 30, however her pH is 7.396, not consistent with severe acidosis.  Additionally her CO2 is normal.  CBC shows minimal anemia with a hemoglobin of 11.9.  CMP shows hyperglycemia at 332 without other significant hematologic or electrolyte derangements.  Anion gap is not significantly elevated.  UA was obtained with over 500 glucose and 40 ketones.  UDS  is positive for benzos.  Ethanol is negative.  Given that she is not in DKA with her symptoms, evaluation was broadened to include D-dimer, and troponin both of which were normal.  CT head was obtained given her fall and reported headache without intracranial hemorrhage, fracture or other acute abnormality.  She was treated in the emergency room with IV fluids, allowed time and to eat and drink.  After this she fully recovered, was awake and alert, able to ambulate and eat and drink.  She denies SI, this appears to be more of a grief reaction and poor self-care rather than a deliberate attempt to harm herself.  This is supported by her husband.  We discussed the importance of self-care including taking insulin, checking blood sugar, eating and drinking appropriately.  We discussed that failing to do so can result in life-threatening conditions.  She states her understanding.  Per her request she is given refill on her insulin, she says that she has some at home and she will take that there.  After IVF she was not orthostatic.  Given her reassuring lab work-up, EKG and CT scan I suspect that her symptoms were due to a combination of dehydration from untreated hyperglycemia, not maintaining adequate p.o.  intake, after a position change going from sitting to standing combined with her stress.  Return precautions were discussed with patient who states their understanding.  At the time of discharge patient denied any unaddressed complaints or concerns.  Patient is agreeable for discharge home.  Note: Portions of this report may have been transcribed using voice recognition software. Every effort was made to ensure accuracy; however, inadvertent computerized transcription errors may be present  This patient was seen as a shared visit with Dr. Jacqulyn Bath.  Final Clinical Impression(s) / ED Diagnoses Final diagnoses:  Syncope, unspecified syncope type  Hyperglycemia due to type 1 diabetes mellitus (HCC)  Grief reaction    Rx / DC Orders ED Discharge Orders         Ordered    insulin aspart (NOVOLOG) 100 UNIT/ML injection     Discontinue  Reprint     01/27/20 2310           Cristina Gong, PA-C 01/28/20 0019    Maia Plan, MD 01/28/20 1223

## 2020-01-27 NOTE — ED Notes (Signed)
Patient transported to CT 

## 2020-04-20 ENCOUNTER — Ambulatory Visit: Payer: Medicaid Other | Admitting: Internal Medicine

## 2020-08-07 ENCOUNTER — Encounter (HOSPITAL_BASED_OUTPATIENT_CLINIC_OR_DEPARTMENT_OTHER): Payer: Self-pay | Admitting: Emergency Medicine

## 2020-08-07 ENCOUNTER — Emergency Department (HOSPITAL_BASED_OUTPATIENT_CLINIC_OR_DEPARTMENT_OTHER)
Admission: EM | Admit: 2020-08-07 | Discharge: 2020-08-07 | Disposition: A | Discharge status: Home / Self Care | Payer: Medicaid Other | Attending: Emergency Medicine | Admitting: Emergency Medicine

## 2020-08-07 ENCOUNTER — Other Ambulatory Visit: Payer: Self-pay

## 2020-08-07 DIAGNOSIS — R112 Nausea with vomiting, unspecified: Secondary | ICD-10-CM

## 2020-08-07 DIAGNOSIS — R197 Diarrhea, unspecified: Secondary | ICD-10-CM | POA: Insufficient documentation

## 2020-08-07 DIAGNOSIS — J4520 Mild intermittent asthma, uncomplicated: Secondary | ICD-10-CM | POA: Insufficient documentation

## 2020-08-07 DIAGNOSIS — R109 Unspecified abdominal pain: Secondary | ICD-10-CM | POA: Diagnosis not present

## 2020-08-07 DIAGNOSIS — E1110 Type 2 diabetes mellitus with ketoacidosis without coma: Secondary | ICD-10-CM | POA: Insufficient documentation

## 2020-08-07 DIAGNOSIS — E1165 Type 2 diabetes mellitus with hyperglycemia: Secondary | ICD-10-CM | POA: Diagnosis not present

## 2020-08-07 DIAGNOSIS — R739 Hyperglycemia, unspecified: Secondary | ICD-10-CM

## 2020-08-07 DIAGNOSIS — Z794 Long term (current) use of insulin: Secondary | ICD-10-CM | POA: Insufficient documentation

## 2020-08-07 LAB — CBC WITH DIFFERENTIAL/PLATELET
Abs Immature Granulocytes: 0.01 K/uL (ref 0.00–0.07)
Basophils Absolute: 0 K/uL (ref 0.0–0.1)
Basophils Relative: 0 %
Eosinophils Absolute: 0 K/uL (ref 0.0–0.5)
Eosinophils Relative: 0 %
HCT: 38.8 % (ref 36.0–46.0)
Hemoglobin: 12 g/dL (ref 12.0–15.0)
Immature Granulocytes: 0 %
Lymphocytes Relative: 30 %
Lymphs Abs: 1.8 K/uL (ref 0.7–4.0)
MCH: 23.3 pg — ABNORMAL LOW (ref 26.0–34.0)
MCHC: 30.9 g/dL (ref 30.0–36.0)
MCV: 75.2 fL — ABNORMAL LOW (ref 80.0–100.0)
Monocytes Absolute: 0.5 K/uL (ref 0.1–1.0)
Monocytes Relative: 9 %
Neutro Abs: 3.7 K/uL (ref 1.7–7.7)
Neutrophils Relative %: 61 %
Platelets: 252 K/uL (ref 150–400)
RBC: 5.16 MIL/uL — ABNORMAL HIGH (ref 3.87–5.11)
RDW: 16.3 % — ABNORMAL HIGH (ref 11.5–15.5)
WBC: 6.1 K/uL (ref 4.0–10.5)
nRBC: 0 % (ref 0.0–0.2)

## 2020-08-07 LAB — COMPREHENSIVE METABOLIC PANEL
ALT: 13 U/L (ref 0–44)
AST: 25 U/L (ref 15–41)
Albumin: 3.9 g/dL (ref 3.5–5.0)
Alkaline Phosphatase: 66 U/L (ref 38–126)
Anion gap: 12 (ref 5–15)
BUN: 11 mg/dL (ref 6–20)
CO2: 23 mmol/L (ref 22–32)
Calcium: 9.5 mg/dL (ref 8.9–10.3)
Chloride: 97 mmol/L — ABNORMAL LOW (ref 98–111)
Creatinine, Ser: 0.87 mg/dL (ref 0.44–1.00)
GFR, Estimated: >60 mL/min (ref >60)
Glucose, Bld: 368 mg/dL — ABNORMAL HIGH (ref 70–99)
Potassium: 3.7 mmol/L (ref 3.5–5.1)
Sodium: 132 mmol/L — ABNORMAL LOW (ref 135–145)
Total Bilirubin: 0.3 mg/dL (ref 0.3–1.2)
Total Protein: 7.8 g/dL (ref 6.5–8.1)

## 2020-08-07 LAB — CBG MONITORING, ED: Glucose-Capillary: 200 mg/dL — ABNORMAL HIGH (ref 70–99)

## 2020-08-07 LAB — LIPASE, BLOOD: Lipase: 35 U/L (ref 11–51)

## 2020-08-07 MED ORDER — SODIUM CHLORIDE 0.9 % IV BOLUS
1000.0000 mL | Freq: Once | INTRAVENOUS | Status: AC
  Administered 2020-08-07: 1000 mL via INTRAVENOUS

## 2020-08-07 MED ORDER — ONDANSETRON HCL 4 MG/2ML IJ SOLN
4.0000 mg | Freq: Once | INTRAMUSCULAR | Status: AC
  Administered 2020-08-07: 4 mg via INTRAVENOUS
  Filled 2020-08-07: qty 2

## 2020-08-07 MED ORDER — ONDANSETRON 4 MG PO TBDP
4.0000 mg | ORAL_TABLET | Freq: Three times a day (TID) | ORAL | 0 refills | Status: DC | PRN
Start: 2020-08-07 — End: 2020-11-27

## 2020-08-07 NOTE — ED Notes (Signed)
Pt tolerating liquids, sipping gingerale, denies n/v

## 2020-08-07 NOTE — ED Notes (Signed)
Per EDP order, pt given fluids and/or food for PO challenge. Pt verbalized understanding to utilize call bell if nausea or emesis occur. 

## 2020-08-07 NOTE — ED Provider Notes (Signed)
[facility] EMERGENCY DEPARTMENT Provider Note   CSN: [id number] Arrival date & time: 08/07/20  8185     History Chief Complaint  Patient presents with  . Abdominal Pain    Christie [name] is a 35 y.o. female.  HPI Patient presents with nausea vomiting diarrhea and some abdominal pain.  States began 2 days ago after she ate an undercooked burger from [employer].  States she ate about half the burger.  States that she began to feel little strange at night but then the next day developed nausea vomiting diarrhea.  Also crampy pain.  States she also had run out of her insulin and had not been checking her sugar.  No fevers.  States she does feel little weak.  Does have urinary frequency.  No blood in the stool or emesis.  States she is really not been able to get much fluid going in except ginger ale.    Past Medical History:  Diagnosis Date  . Abnormal Pap smear    colpo scheduled  . Alpha thalassemia trait   . Anemia   . Anxiety   . Asthma   . Bipolar 1 disorder (HCC)   . Chlamydia   . Depression   . Diabetes mellitus    type 1 on insulin  . Fx ankle    history of left ankle fx at age 24  . Headache(784.0)   . Hx of migraines   . Pregnancy induced hypertension   . Urinary tract infection     Patient Active Problem List   Diagnosis Date Noted  . Mastalgia 11/06/2018  . Bilateral lower extremity edema 08/24/2018  . Poorly controlled diabetes mellitus (HCC) 08/18/2018  . Anxiety 08/12/2018  . Hyperglycemia 08/12/2018  . DKA (diabetic ketoacidoses) 07/11/2018  . 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 History of loop electrical excision procedure (LEEP) 09/23/2017  . Back pain 04/27/2016  . Abnormal Papanicolaou smear of cervix with positive human papilloma virus (HPV) test 09/02/2015  . Adjustment disorder with depressed mood 08/03/2015  . Asthma, mild intermittent 07/06/2013  . Type I diabetes mellitus with complication, uncontrolled (HCC) 06/18/2011   . History of migraine during pregnancy 06/18/2011  . Migraine 06/18/2011    Past Surgical History:  Procedure Laterality Date  . CESAREAN SECTION Bilateral 08/21/2018   Procedure: CESAREAN SECTION WITH BILATERAL TUBAL LIGATION;  Surgeon: Conan Bowens, MD;  Location: [facility] BIRTHING SUIT[facility];  Service: Obstetrics;  Laterality: Bilateral;  . COLPOSCOPY    . 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 TUBAL LIGATION    . WISDOM TOOTH EXTRACTION       OB History    Gravida  4   Para  3   Term  3   Preterm  0   AB  1   Living  3     SAB  0   IAB  1   Ectopic  0   Multiple  0   Live Births  3           Family History  Problem Relation Age of Onset  . Asthma Mother   . Diabetes Mother   . Hypertension Mother   . Asthma Father   . Hypertension Sister   . Diabetes Maternal Grandmother   . Cancer Maternal Grandmother        breast  . Obesity Other   . Sleep apnea Other   . Anesthesia problems Neg Hx     Social History  Tobacco Use  . Smoking status: Never Smoker  . Smokeless tobacco: Never Used  Vaping Use  . Vaping Use: Never used  Substance Use Topics  . Alcohol use: Yes    Comment: occ  . Drug use: No    Comment: last used 1 years ago    Home Medications Prior to Admission medications   Medication Sig Start Date End Date Taking? Authorizing Provider  ondansetron (ZOFRAN-ODT) 4 MG disintegrating tablet Take 1 tablet (4 mg total) by mouth every 8 (eight) hours as needed for nausea or vomiting. 08/07/20  Yes Benjiman Core, MD  albuterol (VENTOLIN HFA) 108 (90 Base) MCG/ACT inhaler Inhale 1-2 puffs into the lungs every 6 (six) hours as needed for wheezing or shortness of breath. 12/10/18   [place] Bing, MD  insulin aspart (NOVOLOG) 100 UNIT/ML injection TAKE AFTER MEALS- 200-250 TAKE 4 UNITS, 251-300 TAKE 6 UNITS, 301-350 8 UNITS , 351-400 10 UNITS. 01/27/20   Cristina Gong, PA-C  Insulin Pen Needle 31G X 8 MM MISC As needed for insulin injections  05/17/15   Judeth Horn, NP  LANTUS SOLOSTAR 100 UNIT/ML Solostar Pen SMARTSIG:35 Unit(s) SUB-Q Daily 09/08/19   [provider]    Allergies    Banana, Citrus, and Adhesive [tape]  Review of Systems   Review of Systems  Constitutional: Negative for appetite change and fatigue.  Respiratory: Negative for shortness of breath.   Gastrointestinal: Positive for abdominal pain, diarrhea, nausea and vomiting.  Genitourinary: Negative for flank pain.  Musculoskeletal: Negative for back pain.  Skin: Negative for rash.  Neurological: Negative for weakness.  Psychiatric/Behavioral: Negative for confusion.    Physical Exam Updated Vital Signs BP 103/74   Pulse 67   Temp 98.4 F (36.9 C) (Oral)   Resp 18   Ht 5\' 9"  (1.753 m)   Wt 113.4 kg   LMP 07/18/2020   SpO2 100%   BMI 36.92 kg/m   Physical Exam Vitals and nursing note reviewed.  HENT:     Head: Normocephalic.  Eyes:     General: No scleral icterus. Cardiovascular:     Rate and Rhythm: Normal rate.  Pulmonary:     Breath sounds: Normal breath sounds.  Abdominal:     Hernia: No hernia is present.     Comments: Mild diffuse tenderness without rebound or guarding.  No hernia palpated.  Skin:    General: Skin is warm.     Capillary Refill: Capillary refill takes less than 2 seconds.  Neurological:     Mental Status: She is alert and oriented to person, place, and time.  Psychiatric:        Mood and Affect: Mood normal.     ED Results / Procedures / Treatments   Labs (all labs ordered are listed, but only abnormal results are displayed) Labs Reviewed  COMPREHENSIVE METABOLIC PANEL - Abnormal; Notable for the following components:      Result Value   Sodium 132 (*)    Chloride 97 (*)    Glucose, Bld 368 (*)    All other components within normal limits  CBC WITH DIFFERENTIAL/PLATELET - Abnormal; Notable for the following components:   RBC 5.16 (*)    MCV 75.2 (*)    MCH 23.3 (*)    RDW 16.3 (*)    All  other components within normal limits  CBG MONITORING, ED - Abnormal; Notable for the following components:   Glucose-Capillary 200 (*)    All other components within normal limits  LIPASE, BLOOD    EKG None  Radiology No results found.  Procedures Procedures   Medications Ordered in ED Medications  sodium chloride 0.9 % bolus 1,000 mL ( Intravenous Stopped 08/07/20 1049)  ondansetron (ZOFRAN) injection 4 mg (4 mg Intravenous Given 08/07/20 0941)  sodium chloride 0.9 % bolus 1,000 mL ( Intravenous Stopped 08/07/20 1153)    ED Course  I have reviewed the triage vital signs and the nursing notes.  Pertinent labs & imaging results that were available during my care of the patient were reviewed by me and considered in my medical decision making (see chart for details).    MDM Rules/Calculators/A&P                          Patient presents with nausea vomiting diarrhea.  Some crampy abdominal pain.  Thinks it could have come from eating an undercooked burger.  Mild hyperglycemia.  Not in DKA.  Fluid boluses given.  Mild hyponatremia.  CBG rechecked and down to 200 from 368.  Feels better and is tolerated oral.  Will discharge with Zofran.  Benign abdominal exam.  Doubt severe intra-abdominal pathology.  Likely foodborne illness versus a separate gastroenteritis.  Will discharge home. Final Clinical Impression(s) / ED Diagnoses Final diagnoses:  Nausea vomiting and diarrhea  Hyperglycemia    Rx / DC Orders ED Discharge Orders         Ordered    ondansetron (ZOFRAN-ODT) 4 MG disintegrating tablet  Every 8 hours PRN        08/07/20 1225           Benjiman Core, MD 08/07/20 1230

## 2020-08-07 NOTE — ED Triage Notes (Signed)
Pt reports abd pain, nausea, and diarrhea since eating and undercooked hamburger on Friday.

## 2020-08-07 NOTE — ED Notes (Signed)
ED Provider at bedside. 

## 2020-08-07 NOTE — ED Notes (Signed)
Pt tolerating ginger ale for po challenge

## 2020-10-06 DIAGNOSIS — Z794 Long term (current) use of insulin: Secondary | ICD-10-CM | POA: Diagnosis not present

## 2020-10-06 DIAGNOSIS — K900 Celiac disease: Secondary | ICD-10-CM | POA: Diagnosis not present

## 2020-10-06 DIAGNOSIS — E1065 Type 1 diabetes mellitus with hyperglycemia: Secondary | ICD-10-CM | POA: Diagnosis not present

## 2020-11-24 DIAGNOSIS — E1065 Type 1 diabetes mellitus with hyperglycemia: Secondary | ICD-10-CM | POA: Diagnosis not present

## 2020-11-24 DIAGNOSIS — Z794 Long term (current) use of insulin: Secondary | ICD-10-CM | POA: Diagnosis not present

## 2020-11-24 DIAGNOSIS — K900 Celiac disease: Secondary | ICD-10-CM | POA: Diagnosis not present

## 2020-11-27 ENCOUNTER — Emergency Department (HOSPITAL_BASED_OUTPATIENT_CLINIC_OR_DEPARTMENT_OTHER): Payer: Medicaid Other

## 2020-11-27 ENCOUNTER — Emergency Department (HOSPITAL_BASED_OUTPATIENT_CLINIC_OR_DEPARTMENT_OTHER)
Admission: EM | Admit: 2020-11-27 | Discharge: 2020-11-27 | Disposition: A | Discharge status: Home / Self Care | Payer: Medicaid Other | Attending: Emergency Medicine | Admitting: Emergency Medicine

## 2020-11-27 ENCOUNTER — Other Ambulatory Visit: Payer: Self-pay

## 2020-11-27 ENCOUNTER — Encounter (HOSPITAL_BASED_OUTPATIENT_CLINIC_OR_DEPARTMENT_OTHER): Payer: Self-pay | Admitting: Emergency Medicine

## 2020-11-27 DIAGNOSIS — R0789 Other chest pain: Secondary | ICD-10-CM | POA: Diagnosis not present

## 2020-11-27 DIAGNOSIS — E1165 Type 2 diabetes mellitus with hyperglycemia: Secondary | ICD-10-CM | POA: Diagnosis not present

## 2020-11-27 DIAGNOSIS — J4520 Mild intermittent asthma, uncomplicated: Secondary | ICD-10-CM | POA: Insufficient documentation

## 2020-11-27 DIAGNOSIS — R079 Chest pain, unspecified: Secondary | ICD-10-CM | POA: Insufficient documentation

## 2020-11-27 DIAGNOSIS — Z20822 Contact with and (suspected) exposure to covid-19: Secondary | ICD-10-CM | POA: Diagnosis not present

## 2020-11-27 DIAGNOSIS — E1065 Type 1 diabetes mellitus with hyperglycemia: Secondary | ICD-10-CM | POA: Diagnosis not present

## 2020-11-27 DIAGNOSIS — E1010 Type 1 diabetes mellitus with ketoacidosis without coma: Secondary | ICD-10-CM | POA: Diagnosis not present

## 2020-11-27 DIAGNOSIS — Z794 Long term (current) use of insulin: Secondary | ICD-10-CM | POA: Diagnosis not present

## 2020-11-27 DIAGNOSIS — R739 Hyperglycemia, unspecified: Secondary | ICD-10-CM

## 2020-11-27 LAB — CBC WITH DIFFERENTIAL/PLATELET
Abs Immature Granulocytes: 0.02 10*3/uL (ref 0.00–0.07)
Basophils Absolute: 0 10*3/uL (ref 0.0–0.1)
Basophils Relative: 0 %
Eosinophils Absolute: 0 10*3/uL (ref 0.0–0.5)
Eosinophils Relative: 0 %
HCT: 37.3 % (ref 36.0–46.0)
Hemoglobin: 11.9 g/dL — ABNORMAL LOW (ref 12.0–15.0)
Immature Granulocytes: 0 %
Lymphocytes Relative: 23 %
Lymphs Abs: 1.6 10*3/uL (ref 0.7–4.0)
MCH: 23.8 pg — ABNORMAL LOW (ref 26.0–34.0)
MCHC: 31.9 g/dL (ref 30.0–36.0)
MCV: 74.6 fL — ABNORMAL LOW (ref 80.0–100.0)
Monocytes Absolute: 0.6 10*3/uL (ref 0.1–1.0)
Monocytes Relative: 9 %
Neutro Abs: 4.7 10*3/uL (ref 1.7–7.7)
Neutrophils Relative %: 68 %
Platelets: 234 10*3/uL (ref 150–400)
RBC: 5 MIL/uL (ref 3.87–5.11)
RDW: 15.2 % (ref 11.5–15.5)
WBC: 6.9 10*3/uL (ref 4.0–10.5)
nRBC: 0 % (ref 0.0–0.2)

## 2020-11-27 LAB — RESP PANEL BY RT-PCR (FLU A&B, COVID) ARPGX2
Influenza A by PCR: NEGATIVE
Influenza B by PCR: NEGATIVE
SARS Coronavirus 2 by RT PCR: NEGATIVE

## 2020-11-27 LAB — COMPREHENSIVE METABOLIC PANEL
ALT: 20 U/L (ref 0–44)
AST: 27 U/L (ref 15–41)
Albumin: 3.7 g/dL (ref 3.5–5.0)
Alkaline Phosphatase: 74 U/L (ref 38–126)
Anion gap: 10 (ref 5–15)
BUN: 13 mg/dL (ref 6–20)
CO2: 28 mmol/L (ref 22–32)
Calcium: 9.3 mg/dL (ref 8.9–10.3)
Chloride: 93 mmol/L — ABNORMAL LOW (ref 98–111)
Creatinine, Ser: 0.85 mg/dL (ref 0.44–1.00)
GFR, Estimated: >60 mL/min (ref >60)
Glucose, Bld: 331 mg/dL — ABNORMAL HIGH (ref 70–99)
Potassium: 3.8 mmol/L (ref 3.5–5.1)
Sodium: 131 mmol/L — ABNORMAL LOW (ref 135–145)
Total Bilirubin: 0.3 mg/dL (ref 0.3–1.2)
Total Protein: 7.8 g/dL (ref 6.5–8.1)

## 2020-11-27 LAB — CBG MONITORING, ED: Glucose-Capillary: 296 mg/dL — ABNORMAL HIGH (ref 70–99)

## 2020-11-27 LAB — LIPASE, BLOOD: Lipase: 33 U/L (ref 11–51)

## 2020-11-27 LAB — TROPONIN I (HIGH SENSITIVITY): Troponin I (High Sensitivity): 3 ng/L (ref <18)

## 2020-11-27 MED ORDER — SODIUM CHLORIDE 0.9 % IV SOLN: Freq: Once | INTRAVENOUS | Status: AC

## 2020-11-27 MED ORDER — METOCLOPRAMIDE HCL 5 MG/ML IJ SOLN
10.0000 mg | Freq: Once | INTRAMUSCULAR | Status: AC
  Administered 2020-11-27: 10 mg via INTRAVENOUS
  Filled 2020-11-27: qty 2

## 2020-11-27 MED ORDER — INSULIN REGULAR HUMAN 100 UNIT/ML IJ SOLN: 15.0000 [IU] | Freq: Once | INTRAMUSCULAR | Status: DC

## 2020-11-27 MED ORDER — INSULIN ASPART 100 UNIT/ML IJ SOLN
8.0000 [IU] | Freq: Once | INTRAMUSCULAR | Status: AC
  Administered 2020-11-27: 8 [IU] via SUBCUTANEOUS

## 2020-11-27 MED ORDER — ONDANSETRON 4 MG PO TBDP: 4.0000 mg | ORAL_TABLET | Freq: Three times a day (TID) | ORAL | 0 refills | Status: DC | PRN

## 2020-11-27 MED ORDER — DICYCLOMINE HCL 20 MG PO TABS: 20.0000 mg | ORAL_TABLET | Freq: Two times a day (BID) | ORAL | 0 refills | Status: DC

## 2020-11-27 MED ORDER — MORPHINE SULFATE (PF) 2 MG/ML IV SOLN
2.0000 mg | Freq: Once | INTRAVENOUS | Status: AC
  Administered 2020-11-27: 2 mg via INTRAVENOUS
  Filled 2020-11-27: qty 1

## 2020-11-27 MED ORDER — DIPHENHYDRAMINE HCL 50 MG/ML IJ SOLN
12.5000 mg | Freq: Once | INTRAMUSCULAR | Status: AC
  Administered 2020-11-27: 12.5 mg via INTRAVENOUS
  Filled 2020-11-27: qty 1

## 2020-11-27 NOTE — ED Triage Notes (Addendum)
Reports central chest pain since Friday.  N/V/D started today.  Per spouse found passed out on the floor.  Unknown how long thinks it was atleast 3-4 minutes.  She has no recollection of event.  Also reports being a type 1 diabetic.  Unable to take insulin like normal.  CBG in triage 343.

## 2020-11-27 NOTE — Discharge Instructions (Signed)
Please follow-up with your primary care doctor.  Please use Zofran as needed for nausea.  I have written a prescription for this.  Please use your insulin to give your blood sugar under control.  Drink plenty of water.

## 2020-11-27 NOTE — ED Provider Notes (Signed)
[facility] EMERGENCY DEPARTMENT Provider Note   CSN: [id number] Arrival date & time: 11/27/20  1909     History Chief Complaint  Patient presents with  . Chest Pain  . Vomiting    Christie [name] is a 35 y.o. female.  HPI Patient is a 35 year old female with past medical history significant for anemia, anxiety, asthma, DM 1 on insulin, headaches, migraines, depression  Patient is presenting today with complaints of chest pain since Friday.  She states that she has also had nausea vomiting diarrhea today.  She states she had 3 episodes of nonbloody nonbilious emesis.  She states that she has not been drinking very much water.  She also recently had an issue with her insulin delivery system and is now having to inject subcu insulin instead to use her monitor.  She denies any shortness of breath.  She states that she still feels nauseous and does have some generalized abdominal pain.  No lightheadedness or dizziness apart from an episode that occurred prior to arrival when she went to use restroom had an extended episode of diarrhea and then started walk back to her room when she felt lightheaded and passed out on the floor.  She states she woke up had a mild headache.  Which is circumferential and achy.  She came to the ER for evaluation.      Past Medical History:  Diagnosis Date  . Abnormal Pap smear    colpo scheduled  . Alpha thalassemia trait   . Anemia   . Anxiety   . Asthma   . Bipolar 1 disorder (HCC)   . Chlamydia   . Depression   . Diabetes mellitus    type 1 on insulin  . Fx ankle    history of left ankle fx at age 89  . Headache(784.0)   . Hx of migraines   . Pregnancy induced hypertension   . Urinary tract infection     Patient Active Problem List   Diagnosis Date Noted  . Mastalgia 11/06/2018  . Bilateral lower extremity edema 08/24/2018  . Poorly controlled diabetes mellitus (HCC) 08/18/2018  . Anxiety 08/12/2018  . Hyperglycemia  08/12/2018  . DKA (diabetic ketoacidoses) 07/11/2018  . 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 History of loop electrical excision procedure (LEEP) 09/23/2017  . Back pain 04/27/2016  . Abnormal Papanicolaou smear of cervix with positive human papilloma virus (HPV) test 09/02/2015  . Adjustment disorder with depressed mood 08/03/2015  . Asthma, mild intermittent 07/06/2013  . Type I diabetes mellitus with complication, uncontrolled (HCC) 06/18/2011  . History of migraine during pregnancy 06/18/2011  . Migraine 06/18/2011    Past Surgical History:  Procedure Laterality Date  . CESAREAN SECTION Bilateral 08/21/2018   Procedure: CESAREAN SECTION WITH BILATERAL TUBAL LIGATION;  Surgeon: Conan Bowens, MD;  Location: [facility] BIRTHING SUITES;  Service: Obstetrics;  Laterality: Bilateral;  . COLPOSCOPY    . 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 TUBAL LIGATION    . WISDOM TOOTH EXTRACTION       OB History    Gravida  4   Para  3   Term  3   Preterm  0   AB  1   Living  3     SAB  0   IAB  1   Ectopic  0   Multiple  0   Live Births  3           Family History  Problem Relation Age of Onset  . Asthma Mother   . Diabetes Mother   . Hypertension Mother   . Asthma Father   . Hypertension Sister   . Diabetes Maternal Grandmother   . Cancer Maternal Grandmother        breast  . Obesity Other   . Sleep apnea Other   . Anesthesia problems Neg Hx     Social History   Tobacco Use  . Smoking status: Never Smoker  . Smokeless tobacco: Never Used  Vaping Use  . Vaping Use: Never used  Substance Use Topics  . Alcohol use: Yes    Comment: occ  . Drug use: No    Comment: last used 1 years ago    Home Medications Prior to Admission medications   Medication Sig Start Date End Date Taking? Authorizing Provider  ondansetron (ZOFRAN ODT) 4 MG disintegrating tablet Take 1 tablet (4 mg total) by mouth every 8 (eight) hours as needed for nausea or vomiting. 11/27/20  Yes  Lysle Yero S, PA  albuterol (VENTOLIN HFA) 108 (90 Base) MCG/ACT inhaler Inhale 1-2 puffs into the lungs every 6 (six) hours as needed for wheezing or shortness of breath. 12/10/18    Bing, MD  insulin aspart (NOVOLOG) 100 UNIT/ML injection TAKE AFTER MEALS- 200-250 TAKE 4 UNITS, 251-300 TAKE 6 UNITS, 301-350 8 UNITS , 351-400 10 UNITS. 01/27/20   Cristina Gong, PA-C  Insulin Pen Needle 31G X 8 MM MISC As needed for insulin injections 05/17/15   Judeth Horn, NP  LANTUS SOLOSTAR 100 UNIT/ML Solostar Pen SMARTSIG:35 Unit(s) SUB-Q Daily 09/08/19   [provider]    Allergies    Banana, Citrus, and Adhesive [tape]  Review of Systems   Review of Systems  Constitutional: Positive for fatigue. Negative for chills and fever.  HENT: Negative for congestion.   Eyes: Negative for pain.  Respiratory: Negative for cough and shortness of breath.   Cardiovascular: Positive for chest pain. Negative for leg swelling.  Gastrointestinal: Positive for abdominal pain, diarrhea, nausea and vomiting.  Genitourinary: Negative for dysuria.  Musculoskeletal: Negative for myalgias.  Skin: Negative for rash.  Neurological: Positive for headaches. Negative for dizziness.    Physical Exam Updated Vital Signs BP 114/62 (BP Location: Left Arm)   Pulse 93   Temp 98.6 F (37 C) (Oral)   Resp 16   Ht 5\' 9"  (1.753 m)   Wt 113.4 kg   LMP 11/20/2020   SpO2 99%   BMI 36.92 kg/m   Physical Exam Vitals and nursing note reviewed.  Constitutional:      General: She is not in acute distress.    Comments: Pleasant well-appearing 35 year old.  In no acute distress.  Sitting comfortably in bed.  Able answer questions appropriately follow commands. No increased work of breathing. Speaking in full sentences.  HENT:     Head: Normocephalic and atraumatic.     Nose: Nose normal.     Mouth/Throat:     Mouth: Mucous membranes are moist.  Eyes:     General: No scleral  icterus. Cardiovascular:     Rate and Rhythm: Normal rate and regular rhythm.     Pulses: Normal pulses.     Heart sounds: Normal heart sounds.  Pulmonary:     Effort: Pulmonary effort is normal. No respiratory distress.     Breath sounds: Normal breath sounds. No wheezing.  Abdominal:     Palpations: Abdomen is soft.     Tenderness: There is  no abdominal tenderness. There is no right CVA tenderness, left CVA tenderness, guarding or rebound.     Comments: Abdomen is soft, nontender.  No guarding or rebound.  No palpable masses.  Musculoskeletal:     Cervical back: Normal range of motion.     Right lower leg: No edema.     Left lower leg: No edema.  Skin:    General: Skin is warm and dry.     Capillary Refill: Capillary refill takes less than 2 seconds.  Neurological:     Mental Status: She is alert. Mental status is at baseline.  Psychiatric:        Mood and Affect: Mood normal.        Behavior: Behavior normal.     ED Results / Procedures / Treatments   Labs (all labs ordered are listed, but only abnormal results are displayed) Labs Reviewed  CBC WITH DIFFERENTIAL/PLATELET - Abnormal; Notable for the following components:      Result Value   Hemoglobin 11.9 (*)    MCV 74.6 (*)    MCH 23.8 (*)    All other components within normal limits  COMPREHENSIVE METABOLIC PANEL - Abnormal; Notable for the following components:   Sodium 131 (*)    Chloride 93 (*)    Glucose, Bld 331 (*)    All other components within normal limits  CBG MONITORING, ED - Abnormal; Notable for the following components:   Glucose-Capillary 296 (*)    All other components within normal limits  RESP PANEL BY RT-PCR (FLU A&B, COVID) ARPGX2  LIPASE, BLOOD  CBG MONITORING, ED  TROPONIN I (HIGH SENSITIVITY)  TROPONIN I (HIGH SENSITIVITY)    EKG EKG Interpretation  Date/Time:  Sunday Nov 27 2020 19:36:39 EDT Ventricular Rate:  78 PR Interval:  161 QRS Duration: 92 QT Interval:  390 QTC  Calculation: 445 R Axis:   80 Text Interpretation: Sinus rhythm No significant change since last tracing Confirmed by Melene PlanFloyd, Dan [id number]) on 11/27/2020 7:49:14 PM   Radiology DG Chest Port 1 View  Result Date: 11/27/2020 CLINICAL DATA:  Chest pain EXAM: PORTABLE CHEST 1 VIEW COMPARISON:  01/27/2020 FINDINGS: The heart size and mediastinal contours are within normal limits. Both lungs are clear. The visualized skeletal structures are unremarkable. IMPRESSION: No active disease. Electronically Signed   By: Helyn NumbersAshesh  Parikh MD   On: 11/27/2020 20:51    Procedures Procedures   Medications Ordered in ED Medications  0.9 %  sodium chloride infusion ( Intravenous Stopped 11/27/20 2116)  morphine 2 MG/ML injection 2 mg (2 mg Intravenous Given 11/27/20 2009)  metoCLOPramide (REGLAN) injection 10 mg (10 mg Intravenous Given 11/27/20 2008)  diphenhydrAMINE (BENADRYL) injection 12.5 mg (12.5 mg Intravenous Given 11/27/20 2006)  insulin aspart (novoLOG) injection 8 Units (8 Units Subcutaneous Given 11/27/20 2216)    ED Course  I have reviewed the triage vital signs and the nursing notes.  Pertinent labs & imaging results that were available during my care of the patient were reviewed by me and considered in my medical decision making (see chart for details).  Patient is a 35 year old female presenting today with chest pain nausea vomiting diarrhea and headache fatigue, also had an episode of syncope today.  She is overall very well-appearing physical exam is unremarkable.  Patient overall appears dehydrated.  She has dry oral mucosa and has an elevated blood sugar of over 300.  She has not been taking her insulin as she normally does  Some concern for DKA.  Will obtain lab work.  Clinical Course as of 11/27/20 2230  Sun Nov 27, 2020  2223 Chest x-ray unremarkable.  Agree of radiology read.  COVID test and influenza negative.  CMP notable for elevated blood sugar 331 was improved to 296 with 1 L  normal saline.  Patient provided with small dose of insulin.  Patient is tolerating p.o. at this time.  Troponin x1 within normal limits lipase within normal list on pancreatitis.  EKG without any significant changes.  Patient had Q waves present in inferior leads prior. [WF]    Clinical Course User Index [WF] Gailen Shelter, [place]   Ultimately suspect viral gastroenteritis.  Patient feels completely improved at this time denies any headache nausea vomiting or abdominal pain.  She received a homeopathic dose of morphine with 2 mg and Reglan Benadryl and small bowel release insulin along with fluids.  Her repeat abdominal exam is benign.  Her chest pain has also resolved.  She is completely asymptomatic at this time.  COVID and influenza test were negative.  MDM Rules/Calculators/A&P                          Will discharge home with Bentyl Zofran and follow-up with PCP.  Return precautions given.  Tolerating p.o. at this time. Final Clinical Impression(s) / ED Diagnoses Final diagnoses:  Chest pain, unspecified type  Hyperglycemia    Rx / DC Orders ED Discharge Orders         Ordered    ondansetron (ZOFRAN ODT) 4 MG disintegrating tablet  Every 8 hours PRN        11/27/20 2222           Gailen Shelter, [place] 11/27/20 2230    Melene Plan, DO 11/27/20 2246

## 2020-11-28 LAB — CBG MONITORING, ED: Glucose-Capillary: 343 mg/dL — ABNORMAL HIGH (ref 70–99)

## 2021-02-10 ENCOUNTER — Other Ambulatory Visit: Payer: Self-pay

## 2021-02-10 ENCOUNTER — Encounter (HOSPITAL_BASED_OUTPATIENT_CLINIC_OR_DEPARTMENT_OTHER): Payer: Self-pay | Admitting: *Deleted

## 2021-02-10 ENCOUNTER — Emergency Department (HOSPITAL_BASED_OUTPATIENT_CLINIC_OR_DEPARTMENT_OTHER)
Admission: EM | Admit: 2021-02-10 | Discharge: 2021-02-10 | Disposition: A | Discharge status: Home / Self Care | Payer: Medicaid Other | Attending: Emergency Medicine | Admitting: Emergency Medicine

## 2021-02-10 DIAGNOSIS — Z794 Long term (current) use of insulin: Secondary | ICD-10-CM | POA: Insufficient documentation

## 2021-02-10 DIAGNOSIS — R300 Dysuria: Secondary | ICD-10-CM | POA: Diagnosis not present

## 2021-02-10 DIAGNOSIS — R319 Hematuria, unspecified: Secondary | ICD-10-CM | POA: Insufficient documentation

## 2021-02-10 DIAGNOSIS — R350 Frequency of micturition: Secondary | ICD-10-CM | POA: Diagnosis not present

## 2021-02-10 DIAGNOSIS — J45909 Unspecified asthma, uncomplicated: Secondary | ICD-10-CM | POA: Diagnosis not present

## 2021-02-10 DIAGNOSIS — E1010 Type 1 diabetes mellitus with ketoacidosis without coma: Secondary | ICD-10-CM | POA: Insufficient documentation

## 2021-02-10 LAB — URINALYSIS, ROUTINE W REFLEX MICROSCOPIC
Bilirubin Urine: NEGATIVE
Glucose, UA: NEGATIVE mg/dL
Ketones, ur: NEGATIVE mg/dL
Nitrite: NEGATIVE
Protein, ur: 30 mg/dL — AB
Specific Gravity, Urine: 1.02 (ref 1.005–1.030)
pH: 6.5 (ref 5.0–8.0)

## 2021-02-10 LAB — URINALYSIS, MICROSCOPIC (REFLEX): RBC / HPF: >50 RBC/hpf (ref 0–5)

## 2021-02-10 LAB — PREGNANCY, URINE: Preg Test, Ur: NEGATIVE

## 2021-02-10 MED ORDER — CEPHALEXIN 500 MG PO CAPS: 500.0000 mg | ORAL_CAPSULE | Freq: Four times a day (QID) | ORAL | 0 refills | Status: DC

## 2021-02-10 MED ORDER — PHENAZOPYRIDINE HCL 95 MG PO TABS: 95.0000 mg | ORAL_TABLET | Freq: Three times a day (TID) | ORAL | 0 refills | Status: DC | PRN

## 2021-02-10 MED ORDER — CEPHALEXIN 250 MG PO CAPS
500.0000 mg | ORAL_CAPSULE | Freq: Once | ORAL | Status: AC
  Administered 2021-02-10: 500 mg via ORAL
  Filled 2021-02-10: qty 2

## 2021-02-10 NOTE — Discharge Instructions (Addendum)
It was a pleasure taking care of you today.  As discussed, your urine showed a possible urinary tract infection.  I am sending home with antibiotic.  Take as prescribed and finish all antibiotics.  Also sending home with symptomatic treatment for your burning with urination.  Take as needed.  Your urine culture is pending.  Results should be available within the next few days.  Return to the ER for new or worsening symptoms.

## 2021-02-10 NOTE — ED Triage Notes (Signed)
Hematuria, urinary frequency, back and lower abdominal pain x 3 days.

## 2021-02-10 NOTE — ED Notes (Signed)
Discharge e-signature unable to be signed due to e-sig pad not working.  Pt verbalized understanding of all discharge instructions, medications, and f/u care.

## 2021-02-10 NOTE — ED Provider Notes (Signed)
[facility] EMERGENCY DEPARTMENT Provider Note   CSN: [id number] Arrival date & time: 02/10/21  2055     History Chief Complaint  Patient presents with   Urinary Tract Infection    Christie [name] is a 35 y.o. female with a past medical history significant for bipolar 1 disorder, diabetes, and alpha thalassemia trait who presents to the ED due to urinary frequency, dysuria, and hematuria x3 days.  Dysuria associated with suprapubic pressure during urination.  Patient admits to chronic low back pain.  Denies fever and chills.  No vaginal symptoms.  No concern for STDs. History of previous UTIs and notes it feels similar. No treatment prior to arrival.  History obtained from patient and past medical records. No interpreter used during encounter.       Past Medical History:  Diagnosis Date   Abnormal Pap smear    colpo scheduled   Alpha thalassemia trait    Anemia    Anxiety    Asthma    Bipolar 1 disorder (HCC)    Chlamydia    Depression    Diabetes mellitus    type 1 on insulin   Fx ankle    history of left ankle fx at age 60   Headache(784.0)    Hx of migraines    Pregnancy induced hypertension    Urinary tract infection     Patient Active Problem List   Diagnosis Date Noted   Mastalgia 11/06/2018   Bilateral lower extremity edema 08/24/2018   Poorly controlled diabetes mellitus (HCC) 08/18/2018   Anxiety 08/12/2018   Hyperglycemia 08/12/2018   DKA (diabetic ketoacidoses) 07/11/2018   Alpha+ thalassemia trait 03/12/2018   History of loop electrical excision procedure (LEEP) 09/23/2017   Back pain 04/27/2016   Abnormal Papanicolaou smear of cervix with positive human papilloma virus (HPV) test 09/02/2015   Adjustment disorder with depressed mood 08/03/2015   Asthma, mild intermittent 07/06/2013   Type I diabetes mellitus with complication, uncontrolled (HCC) 06/18/2011   History of migraine during pregnancy 06/18/2011   Migraine 06/18/2011     Past Surgical History:  Procedure Laterality Date   CESAREAN SECTION Bilateral 08/21/2018   Procedure: CESAREAN SECTION WITH BILATERAL TUBAL LIGATION;  Surgeon: Conan Bowens, MD;  Location: [facility] BIRTHING SUITES;  Service: Obstetrics;  Laterality: Bilateral;   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   TUBAL LIGATION     WISDOM TOOTH EXTRACTION       OB History     Gravida  4   Para  3   Term  3   Preterm  0   AB  1   Living  3      SAB  0   IAB  1   Ectopic  0   Multiple  0   Live Births  3           Family History  Problem Relation Age of Onset   Asthma Mother    Diabetes Mother    Hypertension Mother    Asthma Father    Hypertension Sister    Diabetes Maternal Grandmother    Cancer Maternal Grandmother        breast   Obesity Other    Sleep apnea Other    Anesthesia problems Neg Hx     Social History   Tobacco Use   Smoking status: Never   Smokeless tobacco: Never  Vaping Use   Vaping Use: Never used  Substance Use Topics   Alcohol use: Yes    Comment: occ   Drug use: No    Comment: last used 1 years ago    Home Medications Prior to Admission medications   Medication Sig Start Date End Date Taking? Authorizing Provider  cephALEXin (KEFLEX) 500 MG capsule Take 1 capsule (500 mg total) by mouth 4 (four) times daily. 02/10/21  Yes Maikayla Beggs C, PA-C  insulin aspart (NOVOLOG) 100 UNIT/ML injection TAKE AFTER MEALS- 200-250 TAKE 4 UNITS, 251-300 TAKE 6 UNITS, 301-350 8 UNITS , 351-400 10 UNITS. 01/27/20  Yes Cristina Gong, PA-C  phenazopyridine (PYRIDIUM) 95 MG tablet Take 1 tablet (95 mg total) by mouth 3 (three) times daily as needed for pain. 02/10/21  Yes Maxon Kresse, Merla Riches, PA-C  albuterol (VENTOLIN HFA) 108 (90 Base) MCG/ACT inhaler Inhale 1-2 puffs into the lungs every 6 (six) hours as needed for wheezing or shortness of breath. 12/10/18   Carlisle Bing, MD  dicyclomine (BENTYL) 20 MG tablet Take 1 tablet (20 mg  total) by mouth 2 (two) times daily. 11/27/20   Gailen Shelter, PA  Insulin Pen Needle 31G X 8 MM MISC As needed for insulin injections 05/17/15   Judeth Horn, NP  LANTUS SOLOSTAR 100 UNIT/ML Solostar Pen SMARTSIG:35 Unit(s) SUB-Q Daily 09/08/19   [provider]  ondansetron (ZOFRAN ODT) 4 MG disintegrating tablet Take 1 tablet (4 mg total) by mouth every 8 (eight) hours as needed for nausea or vomiting. 11/27/20   Gailen Shelter, PA    Allergies    Banana, Citrus, and Adhesive [tape]  Review of Systems   Review of Systems  Constitutional:  Negative for chills and fever.  Gastrointestinal:  Positive for abdominal pain (suprapubic). Negative for nausea and vomiting.  Genitourinary:  Positive for dysuria, frequency, hematuria and pelvic pain. Negative for flank pain and vaginal discharge.  All other systems reviewed and are negative.  Physical Exam Updated Vital Signs BP 136/86 (BP Location: Right Arm)   Pulse 97   Temp 98.2 F (36.8 C) (Oral)   Resp 18   Ht 5\' 9"  (1.753 m)   Wt 116.3 kg   LMP 01/27/2021   SpO2 100%   BMI 37.86 kg/m   Physical Exam Vitals and nursing note reviewed.  Constitutional:      General: She is not in acute distress.    Appearance: She is not ill-appearing.  HENT:     Head: Normocephalic.  Eyes:     Pupils: Pupils are equal, round, and reactive to light.  Cardiovascular:     Rate and Rhythm: Normal rate and regular rhythm.     Pulses: Normal pulses.     Heart sounds: Normal heart sounds. No murmur heard.   No friction rub. No gallop.  Pulmonary:     Effort: Pulmonary effort is normal.     Breath sounds: Normal breath sounds.  Abdominal:     General: Abdomen is flat. There is no distension.     Palpations: Abdomen is soft.     Tenderness: There is no abdominal tenderness. There is no guarding or rebound.     Comments: Abdomen soft, nondistended, nontender to palpation in all quadrants without guarding or peritoneal signs. No  rebound.  No CVA tenderness bilaterally.  Musculoskeletal:        General: Normal range of motion.     Cervical back: Neck supple.  Skin:    General: Skin is warm and dry.  Neurological:  General: No focal deficit present.     Mental Status: She is alert.  Psychiatric:        Mood and Affect: Mood normal.        Behavior: Behavior normal.    ED Results / Procedures / Treatments   Labs (all labs ordered are listed, but only abnormal results are displayed) Labs Reviewed  URINALYSIS, ROUTINE W REFLEX MICROSCOPIC - Abnormal; Notable for the following components:      Result Value   Hgb urine dipstick LARGE (*)    Protein, ur 30 (*)    Leukocytes,Ua SMALL (*)    All other components within normal limits  URINALYSIS, MICROSCOPIC (REFLEX) - Abnormal; Notable for the following components:   Bacteria, UA FEW (*)    All other components within normal limits  URINE CULTURE  PREGNANCY, URINE    EKG None  Radiology No results found.  Procedures Procedures   Medications Ordered in ED Medications  cephALEXin (KEFLEX) capsule 500 mg (500 mg Oral Given 02/10/21 2314)    ED Course  I have reviewed the triage vital signs and the nursing notes.  Pertinent labs & imaging results that were available during my care of the patient were reviewed by me and considered in my medical decision making (see chart for details).  Clinical Course as of 02/10/21 2316  Fri Feb 10, 2021  2259 Hgb urine dipstick(!): LARGE [CA]  2259 Protein(!): 30 [CA]  2259 Glori Luis): SMALL [CA]  2259 Bacteria, UA(!): FEW [CA]    Clinical Course User Index [CA] Mannie Stabile, PA-C   MDM Rules/Calculators/A&P                           35 year old female presents to the ED due to dysuria, urinary frequency, and hematuria x3 days.  She admits to suprapubic pressure during urination.  Admits to chronic low back pain with no change from baseline.  No fever or chills.  Upon arrival, vitals all  within normal limits.  Patient is afebrile, not tachycardic or hypoxic.  Patient in no acute distress.  Physical exam reassuring.  Abdomen soft, nondistended, nontender.  Negative CVA tenderness bilaterally.  Low suspicion for pyelonephritis.  UA significant for large hematuria, small leukocytes, and few bacteria.  Pregnancy test negative. Urine culture pending.  Shared decision making in regards to treating for possible UTI versus CT scan and  labs to rule out possible kidney stone given large hematuria.  Patient prefers to treat for possible UTI. Patient discharged with Keflex and Pyridium.  Advised patient to follow-up with PCP symptoms not improved within the next week. Strict ED precautions discussed with patient. Patient states understanding and agrees to plan. Patient discharged home in no acute distress and stable vitals.  Final Clinical Impression(s) / ED Diagnoses Final diagnoses:  Dysuria    Rx / DC Orders ED Discharge Orders          Ordered    cephALEXin (KEFLEX) 500 MG capsule  4 times daily        02/10/21 2315    phenazopyridine (PYRIDIUM) 95 MG tablet  3 times daily PRN        02/10/21 2315             Mannie Stabile, PA-C 02/10/21 2316    Glynn Octave, MD 02/11/21 0400

## 2021-02-14 LAB — URINE CULTURE: Culture: >=100000 — AB

## 2021-02-15 ENCOUNTER — Telehealth: Payer: Self-pay

## 2021-02-15 NOTE — Telephone Encounter (Signed)
Post ED Visit - Positive Culture Follow-up  Culture report reviewed by antimicrobial stewardship pharmacist: [facility] [x]  A.Lawless, Pharm.D. []  , Pharm.D., BCPS AQ-ID []  , Pharm.D., BCPS []  Celedonio Miyamoto, Pharm.D., BCPS []  [place], Garvin Fila.D., BCPS, AAHIVP []  , Pharm.D., BCPS, AAHIVP []  Georgina Pillion, PharmD, BCPS []  , PharmD, BCPS []  [place], PharmD, BCPS []  [address], PharmD []  , PharmD, BCPS []  Estella Husk, PharmD  Pharmacy Team []  Lysle Pearl, PharmD []  , PharmD []  Phillips Climes, PharmD []  , Rph []  Agapito Games) , PharmD []  Verlan Friends, PharmD []  , PharmD []  Mervyn Gay, PharmD []  , PharmD []  Vinnie Level, PharmD []  [facility], PharmD []  , PharmD []  Len Childs, PharmD   Positive urine culture Treated with Cephalexin, organism sensitive to the same and no further patient follow-up is required at this time.  02/15/2021, 11:05 AM

## 2021-02-22 DIAGNOSIS — E1065 Type 1 diabetes mellitus with hyperglycemia: Secondary | ICD-10-CM | POA: Diagnosis not present

## 2021-02-22 DIAGNOSIS — Z794 Long term (current) use of insulin: Secondary | ICD-10-CM | POA: Diagnosis not present

## 2021-02-22 DIAGNOSIS — K900 Celiac disease: Secondary | ICD-10-CM | POA: Diagnosis not present

## 2021-04-06 DIAGNOSIS — E1065 Type 1 diabetes mellitus with hyperglycemia: Secondary | ICD-10-CM | POA: Diagnosis not present

## 2021-04-06 DIAGNOSIS — K900 Celiac disease: Secondary | ICD-10-CM | POA: Diagnosis not present

## 2021-04-06 DIAGNOSIS — Z794 Long term (current) use of insulin: Secondary | ICD-10-CM | POA: Diagnosis not present

## 2021-05-08 ENCOUNTER — Other Ambulatory Visit: Payer: Self-pay

## 2021-05-08 ENCOUNTER — Encounter (HOSPITAL_BASED_OUTPATIENT_CLINIC_OR_DEPARTMENT_OTHER): Payer: Self-pay | Admitting: Emergency Medicine

## 2021-05-08 ENCOUNTER — Emergency Department (HOSPITAL_BASED_OUTPATIENT_CLINIC_OR_DEPARTMENT_OTHER)
Admission: EM | Admit: 2021-05-08 | Discharge: 2021-05-09 | Disposition: A | Discharge status: Home / Self Care | Payer: Medicaid Other | Attending: Emergency Medicine | Admitting: Emergency Medicine

## 2021-05-08 DIAGNOSIS — S3992XA Unspecified injury of lower back, initial encounter: Secondary | ICD-10-CM | POA: Diagnosis not present

## 2021-05-08 DIAGNOSIS — Y92410 Unspecified street and highway as the place of occurrence of the external cause: Secondary | ICD-10-CM | POA: Insufficient documentation

## 2021-05-08 DIAGNOSIS — Z5321 Procedure and treatment not carried out due to patient leaving prior to being seen by health care provider: Secondary | ICD-10-CM | POA: Diagnosis not present
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
## 2021-05-10 ENCOUNTER — Emergency Department (HOSPITAL_BASED_OUTPATIENT_CLINIC_OR_DEPARTMENT_OTHER): Payer: Medicaid Other | Admitting: Radiology

## 2021-05-10 ENCOUNTER — Encounter (HOSPITAL_BASED_OUTPATIENT_CLINIC_OR_DEPARTMENT_OTHER): Payer: Self-pay | Admitting: Obstetrics and Gynecology

## 2021-05-10 ENCOUNTER — Emergency Department (HOSPITAL_BASED_OUTPATIENT_CLINIC_OR_DEPARTMENT_OTHER)
Admission: EM | Admit: 2021-05-10 | Discharge: 2021-05-10 | Disposition: A | Discharge status: Home / Self Care | Payer: Medicaid Other | Attending: Emergency Medicine | Admitting: Emergency Medicine

## 2021-05-10 ENCOUNTER — Other Ambulatory Visit: Payer: Self-pay

## 2021-05-10 DIAGNOSIS — M545 Low back pain, unspecified: Secondary | ICD-10-CM | POA: Diagnosis not present

## 2021-05-10 DIAGNOSIS — Z041 Encounter for examination and observation following transport accident: Secondary | ICD-10-CM | POA: Diagnosis not present

## 2021-05-10 DIAGNOSIS — Z794 Long term (current) use of insulin: Secondary | ICD-10-CM | POA: Insufficient documentation

## 2021-05-10 DIAGNOSIS — Y92410 Unspecified street and highway as the place of occurrence of the external cause: Secondary | ICD-10-CM | POA: Diagnosis not present

## 2021-05-10 DIAGNOSIS — J4520 Mild intermittent asthma, uncomplicated: Secondary | ICD-10-CM | POA: Diagnosis not present

## 2021-05-10 DIAGNOSIS — E1010 Type 1 diabetes mellitus with ketoacidosis without coma: Secondary | ICD-10-CM | POA: Diagnosis not present

## 2021-05-10 MED ORDER — KETOROLAC TROMETHAMINE 15 MG/ML IJ SOLN
15.0000 mg | Freq: Once | INTRAMUSCULAR | Status: AC
  Administered 2021-05-10: 15 mg via INTRAMUSCULAR
  Filled 2021-05-10: qty 1

## 2021-05-10 MED ORDER — METHOCARBAMOL 500 MG PO TABS: 500.0000 mg | ORAL_TABLET | Freq: Two times a day (BID) | ORAL | 0 refills | Status: AC

## 2021-05-10 MED ORDER — NAPROXEN 375 MG PO TABS: 375.0000 mg | ORAL_TABLET | Freq: Two times a day (BID) | ORAL | 0 refills | Status: AC

## 2021-05-10 NOTE — Discharge Instructions (Addendum)
I have prescribed muscle relaxers for your pain, please do not drink or drive while taking this medications as it can make you drowsy.     Please follow-up with PCP in 1 week for reevaluation of your symptoms.If you experience any bowel or bladder incontinence, fever, worsening in your symptoms please return to the ED.  
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
## 2021-05-16 DIAGNOSIS — M545 Low back pain, unspecified: Secondary | ICD-10-CM | POA: Diagnosis not present

## 2021-05-30 DIAGNOSIS — Z0000 Encounter for general adult medical examination without abnormal findings: Secondary | ICD-10-CM | POA: Diagnosis not present

## 2021-05-30 DIAGNOSIS — Z131 Encounter for screening for diabetes mellitus: Secondary | ICD-10-CM | POA: Diagnosis not present

## 2021-05-30 DIAGNOSIS — E559 Vitamin D deficiency, unspecified: Secondary | ICD-10-CM | POA: Diagnosis not present

## 2021-05-30 DIAGNOSIS — Z1331 Encounter for screening for depression: Secondary | ICD-10-CM | POA: Diagnosis not present

## 2021-05-30 DIAGNOSIS — R0602 Shortness of breath: Secondary | ICD-10-CM | POA: Diagnosis not present

## 2021-05-30 DIAGNOSIS — R5383 Other fatigue: Secondary | ICD-10-CM | POA: Diagnosis not present

## 2021-06-08 DIAGNOSIS — K900 Celiac disease: Secondary | ICD-10-CM | POA: Diagnosis not present

## 2021-06-08 DIAGNOSIS — Z794 Long term (current) use of insulin: Secondary | ICD-10-CM | POA: Diagnosis not present

## 2021-06-08 DIAGNOSIS — E1065 Type 1 diabetes mellitus with hyperglycemia: Secondary | ICD-10-CM | POA: Diagnosis not present

## 2021-06-14 DIAGNOSIS — M545 Low back pain, unspecified: Secondary | ICD-10-CM | POA: Diagnosis not present

## 2021-06-20 DIAGNOSIS — M545 Low back pain, unspecified: Secondary | ICD-10-CM | POA: Diagnosis not present

## 2021-06-27 DIAGNOSIS — M545 Low back pain, unspecified: Secondary | ICD-10-CM | POA: Diagnosis not present

## 2021-07-05 DIAGNOSIS — M545 Low back pain, unspecified: Secondary | ICD-10-CM | POA: Diagnosis not present

## 2021-07-13 DIAGNOSIS — M545 Low back pain, unspecified: Secondary | ICD-10-CM | POA: Diagnosis not present

## 2021-07-18 DIAGNOSIS — M545 Low back pain, unspecified: Secondary | ICD-10-CM | POA: Diagnosis not present

## 2021-07-20 DIAGNOSIS — M545 Low back pain, unspecified: Secondary | ICD-10-CM | POA: Diagnosis not present

## 2021-07-25 DIAGNOSIS — M545 Low back pain, unspecified: Secondary | ICD-10-CM | POA: Diagnosis not present

## 2021-07-25 DIAGNOSIS — M25552 Pain in left hip: Secondary | ICD-10-CM | POA: Diagnosis not present

## 2021-08-01 DIAGNOSIS — M25552 Pain in left hip: Secondary | ICD-10-CM | POA: Diagnosis not present

## 2021-08-01 DIAGNOSIS — M545 Low back pain, unspecified: Secondary | ICD-10-CM | POA: Diagnosis not present

## 2021-08-04 DIAGNOSIS — M25552 Pain in left hip: Secondary | ICD-10-CM | POA: Diagnosis not present

## 2021-08-04 DIAGNOSIS — F419 Anxiety disorder, unspecified: Secondary | ICD-10-CM | POA: Diagnosis not present

## 2021-08-04 DIAGNOSIS — M545 Low back pain, unspecified: Secondary | ICD-10-CM | POA: Diagnosis not present

## 2021-08-24 DIAGNOSIS — M25552 Pain in left hip: Secondary | ICD-10-CM | POA: Diagnosis not present

## 2021-08-24 DIAGNOSIS — M545 Low back pain, unspecified: Secondary | ICD-10-CM | POA: Diagnosis not present

## 2021-09-05 ENCOUNTER — Ambulatory Visit: Payer: Medicaid Other | Admitting: Family

## 2021-09-07 DIAGNOSIS — Z794 Long term (current) use of insulin: Secondary | ICD-10-CM | POA: Diagnosis not present

## 2021-09-07 DIAGNOSIS — E1065 Type 1 diabetes mellitus with hyperglycemia: Secondary | ICD-10-CM | POA: Diagnosis not present

## 2021-09-07 DIAGNOSIS — K900 Celiac disease: Secondary | ICD-10-CM | POA: Diagnosis not present

## 2021-09-15 ENCOUNTER — Ambulatory Visit: Payer: Medicaid Other | Admitting: Family

## 2021-09-15 ENCOUNTER — Encounter: Payer: Self-pay | Admitting: Family

## 2021-09-15 VITALS — BP 100/60 | HR 77 | Temp 98.6°F | Ht 69.0 in | Wt 235.8 lb

## 2021-09-15 DIAGNOSIS — R0602 Shortness of breath: Secondary | ICD-10-CM

## 2021-09-15 DIAGNOSIS — G8929 Other chronic pain: Secondary | ICD-10-CM

## 2021-09-15 DIAGNOSIS — F419 Anxiety disorder, unspecified: Secondary | ICD-10-CM | POA: Diagnosis not present

## 2021-09-15 DIAGNOSIS — M25552 Pain in left hip: Secondary | ICD-10-CM | POA: Diagnosis not present

## 2021-09-15 DIAGNOSIS — Z124 Encounter for screening for malignant neoplasm of cervix: Secondary | ICD-10-CM

## 2021-09-15 MED ORDER — ALBUTEROL SULFATE HFA 108 (90 BASE) MCG/ACT IN AERS: 1.0000 | INHALATION_SPRAY | Freq: Four times a day (QID) | RESPIRATORY_TRACT | 1 refills | Status: DC | PRN

## 2021-09-15 MED ORDER — DULOXETINE HCL 60 MG PO CPEP: 60.0000 mg | ORAL_CAPSULE | Freq: Every day | ORAL | 1 refills | Status: DC
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
## 2021-09-21 ENCOUNTER — Ambulatory Visit (HOSPITAL_BASED_OUTPATIENT_CLINIC_OR_DEPARTMENT_OTHER): Payer: Medicaid Other | Admitting: Orthopaedic Surgery

## 2021-09-22 ENCOUNTER — Ambulatory Visit (INDEPENDENT_AMBULATORY_CARE_PROVIDER_SITE_OTHER): Payer: Medicaid Other | Admitting: Orthopaedic Surgery

## 2021-09-22 ENCOUNTER — Other Ambulatory Visit: Payer: Self-pay

## 2021-09-22 ENCOUNTER — Other Ambulatory Visit (HOSPITAL_BASED_OUTPATIENT_CLINIC_OR_DEPARTMENT_OTHER): Payer: Self-pay | Admitting: Orthopaedic Surgery

## 2021-09-22 ENCOUNTER — Ambulatory Visit (HOSPITAL_BASED_OUTPATIENT_CLINIC_OR_DEPARTMENT_OTHER)
Admission: RE | Admit: 2021-09-22 | Discharge: 2021-09-22 | Disposition: A | Discharge status: Home / Self Care | Payer: Medicaid Other | Source: Ambulatory Visit | Attending: Orthopaedic Surgery | Admitting: Orthopaedic Surgery

## 2021-09-22 DIAGNOSIS — M25559 Pain in unspecified hip: Secondary | ICD-10-CM | POA: Diagnosis not present

## 2021-09-22 DIAGNOSIS — S76012A Strain of muscle, fascia and tendon of left hip, initial encounter: Secondary | ICD-10-CM
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
## 2021-09-26 ENCOUNTER — Encounter (HOSPITAL_BASED_OUTPATIENT_CLINIC_OR_DEPARTMENT_OTHER): Payer: Self-pay | Admitting: Orthopaedic Surgery

## 2021-10-03 ENCOUNTER — Ambulatory Visit: Payer: Medicaid Other | Admitting: Family

## 2021-10-16 ENCOUNTER — Ambulatory Visit (HOSPITAL_BASED_OUTPATIENT_CLINIC_OR_DEPARTMENT_OTHER): Payer: Medicaid Other | Admitting: Orthopaedic Surgery

## 2021-10-20 ENCOUNTER — Ambulatory Visit (HOSPITAL_BASED_OUTPATIENT_CLINIC_OR_DEPARTMENT_OTHER): Payer: Medicaid Other | Admitting: Orthopaedic Surgery

## 2021-10-20 DIAGNOSIS — S76012A Strain of muscle, fascia and tendon of left hip, initial encounter: Secondary | ICD-10-CM
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
## 2021-10-26 ENCOUNTER — Encounter: Payer: Self-pay | Admitting: Family Medicine

## 2021-10-26 ENCOUNTER — Encounter: Payer: Self-pay | Admitting: Family

## 2021-10-27 ENCOUNTER — Encounter: Payer: Self-pay | Admitting: Family

## 2021-10-27 ENCOUNTER — Other Ambulatory Visit: Payer: Self-pay | Admitting: Family

## 2021-10-27 DIAGNOSIS — N926 Irregular menstruation, unspecified: Secondary | ICD-10-CM

## 2021-10-28 ENCOUNTER — Other Ambulatory Visit: Payer: Self-pay

## 2021-10-28 ENCOUNTER — Emergency Department (HOSPITAL_BASED_OUTPATIENT_CLINIC_OR_DEPARTMENT_OTHER)
Admission: EM | Admit: 2021-10-28 | Discharge: 2021-10-29 | Disposition: A | Discharge status: Home / Self Care | Payer: Medicaid Other | Attending: Emergency Medicine | Admitting: Emergency Medicine

## 2021-10-28 ENCOUNTER — Encounter (HOSPITAL_BASED_OUTPATIENT_CLINIC_OR_DEPARTMENT_OTHER): Payer: Self-pay | Admitting: Emergency Medicine

## 2021-10-28 ENCOUNTER — Emergency Department (HOSPITAL_BASED_OUTPATIENT_CLINIC_OR_DEPARTMENT_OTHER): Payer: Medicaid Other

## 2021-10-28 DIAGNOSIS — Y900 Blood alcohol level of less than 20 mg/100 ml: Secondary | ICD-10-CM | POA: Diagnosis not present

## 2021-10-28 DIAGNOSIS — D649 Anemia, unspecified: Secondary | ICD-10-CM | POA: Diagnosis not present

## 2021-10-28 DIAGNOSIS — J45909 Unspecified asthma, uncomplicated: Secondary | ICD-10-CM | POA: Diagnosis not present

## 2021-10-28 DIAGNOSIS — Z79899 Other long term (current) drug therapy: Secondary | ICD-10-CM | POA: Diagnosis not present

## 2021-10-28 DIAGNOSIS — N939 Abnormal uterine and vaginal bleeding, unspecified: Secondary | ICD-10-CM | POA: Insufficient documentation

## 2021-10-28 DIAGNOSIS — E1065 Type 1 diabetes mellitus with hyperglycemia: Secondary | ICD-10-CM | POA: Insufficient documentation

## 2021-10-28 DIAGNOSIS — D509 Iron deficiency anemia, unspecified: Secondary | ICD-10-CM | POA: Diagnosis not present

## 2021-10-28 DIAGNOSIS — R000 Tachycardia, unspecified: Secondary | ICD-10-CM | POA: Insufficient documentation

## 2021-10-28 DIAGNOSIS — Z794 Long term (current) use of insulin: Secondary | ICD-10-CM | POA: Insufficient documentation

## 2021-10-28 DIAGNOSIS — N8300 Follicular cyst of ovary, unspecified side: Secondary | ICD-10-CM | POA: Diagnosis not present

## 2021-10-28 DIAGNOSIS — T50901A Poisoning by unspecified drugs, medicaments and biological substances, accidental (unintentional), initial encounter: Secondary | ICD-10-CM

## 2021-10-28 LAB — CBC WITH DIFFERENTIAL/PLATELET
Abs Immature Granulocytes: 0.01 10*3/uL (ref 0.00–0.07)
Basophils Absolute: 0 10*3/uL (ref 0.0–0.1)
Basophils Relative: 0 %
Eosinophils Absolute: 0 10*3/uL (ref 0.0–0.5)
Eosinophils Relative: 0 %
HCT: 31.6 % — ABNORMAL LOW (ref 36.0–46.0)
Hemoglobin: 9.9 g/dL — ABNORMAL LOW (ref 12.0–15.0)
Immature Granulocytes: 0 %
Lymphocytes Relative: 37 %
Lymphs Abs: 2.1 10*3/uL (ref 0.7–4.0)
MCH: 23.6 pg — ABNORMAL LOW (ref 26.0–34.0)
MCHC: 31.3 g/dL (ref 30.0–36.0)
MCV: 75.4 fL — ABNORMAL LOW (ref 80.0–100.0)
Monocytes Absolute: 0.5 10*3/uL (ref 0.1–1.0)
Monocytes Relative: 9 %
Neutro Abs: 3.1 10*3/uL (ref 1.7–7.7)
Neutrophils Relative %: 54 %
Platelets: 238 10*3/uL (ref 150–400)
RBC: 4.19 MIL/uL (ref 3.87–5.11)
RDW: 14.8 % (ref 11.5–15.5)
WBC: 5.7 10*3/uL (ref 4.0–10.5)
nRBC: 0 % (ref 0.0–0.2)

## 2021-10-28 LAB — COMPREHENSIVE METABOLIC PANEL
ALT: 15 U/L (ref 0–44)
AST: 18 U/L (ref 15–41)
Albumin: 3.3 g/dL — ABNORMAL LOW (ref 3.5–5.0)
Alkaline Phosphatase: 63 U/L (ref 38–126)
Anion gap: 9 (ref 5–15)
BUN: 11 mg/dL (ref 6–20)
CO2: 24 mmol/L (ref 22–32)
Calcium: 8.8 mg/dL — ABNORMAL LOW (ref 8.9–10.3)
Chloride: 99 mmol/L (ref 98–111)
Creatinine, Ser: 0.89 mg/dL (ref 0.44–1.00)
GFR, Estimated: >60 mL/min (ref >60)
Glucose, Bld: 427 mg/dL — ABNORMAL HIGH (ref 70–99)
Potassium: 4.1 mmol/L (ref 3.5–5.1)
Sodium: 132 mmol/L — ABNORMAL LOW (ref 135–145)
Total Bilirubin: 0.4 mg/dL (ref 0.3–1.2)
Total Protein: 6.8 g/dL (ref 6.5–8.1)

## 2021-10-28 LAB — URINALYSIS, MICROSCOPIC (REFLEX)

## 2021-10-28 LAB — URINALYSIS, ROUTINE W REFLEX MICROSCOPIC
Bilirubin Urine: NEGATIVE
Glucose, UA: >=500 mg/dL — AB
Ketones, ur: 15 mg/dL — AB
Leukocytes,Ua: NEGATIVE
Nitrite: NEGATIVE
Protein, ur: NEGATIVE mg/dL
Specific Gravity, Urine: 1.025 (ref 1.005–1.030)
pH: 5.5 (ref 5.0–8.0)

## 2021-10-28 LAB — PREGNANCY, URINE: Preg Test, Ur: NEGATIVE

## 2021-10-28 LAB — CBG MONITORING, ED
Glucose-Capillary: 387 mg/dL — ABNORMAL HIGH (ref 70–99)
Glucose-Capillary: 411 mg/dL — ABNORMAL HIGH (ref 70–99)
Glucose-Capillary: 351 mg/dL — ABNORMAL HIGH (ref 70–99)

## 2021-10-28 LAB — RAPID URINE DRUG SCREEN, HOSP PERFORMED
Amphetamines: NOT DETECTED
Barbiturates: NOT DETECTED
Benzodiazepines: NOT DETECTED
Cocaine: NOT DETECTED
Opiates: NOT DETECTED
Tetrahydrocannabinol: NOT DETECTED

## 2021-10-28 LAB — ACETAMINOPHEN LEVEL: Acetaminophen (Tylenol), Serum: <10 ug/mL — ABNORMAL LOW (ref 10–30)

## 2021-10-28 LAB — LIPASE, BLOOD: Lipase: 32 U/L (ref 11–51)

## 2021-10-28 LAB — ETHANOL: Alcohol, Ethyl (B): <10 mg/dL (ref <10)

## 2021-10-28 MED ORDER — LACTATED RINGERS IV BOLUS
1000.0000 mL | Freq: Once | INTRAVENOUS | Status: AC
  Administered 2021-10-28: 1000 mL via INTRAVENOUS

## 2021-10-28 MED ORDER — LACTATED RINGERS IV BOLUS
1000.0000 mL | Freq: Once | INTRAVENOUS | Status: AC
Start: 2021-10-28 — End: 2021-10-28
  Administered 2021-10-28: 1000 mL via INTRAVENOUS

## 2021-10-28 MED ORDER — INSULIN ASPART 100 UNIT/ML IJ SOLN
10.0000 [IU] | Freq: Once | INTRAMUSCULAR | Status: AC
  Administered 2021-10-28: 10 [IU] via SUBCUTANEOUS

## 2021-10-28 MED ORDER — INSULIN REGULAR HUMAN 100 UNIT/ML IJ SOLN: 10.0000 [IU] | Freq: Once | INTRAMUSCULAR | Status: DC

## 2021-10-28 NOTE — ED Notes (Signed)
Pt more somnolent now; still having periods of body jerking involuntarily  ?

## 2021-10-28 NOTE — ED Provider Notes (Signed)
?[facility] EMERGENCY DEPARTMENT ?Provider Note ? ? ?CSN: [id number] ?Arrival date & time: 10/28/21  1745 ? ?  ? ?History ? ?Chief Complaint  ?Patient presents with  ? Ingestion  ? Vaginal Bleeding  ? ? ?Christie [name] is a 36 y.o. female. ? ? ?Ingestion ? ?Vaginal Bleeding ? ?Patient with medical history of asthma, type 1 diabetes, history of LEEP presents today with lower abdominal pain and ingestion. ? ?Congestion-patient states she took 7 Flexeril today.  States she took it because she was having lower abdominal cramping which has been painful for the last 3 weeks.  Denies any intentional self-harm. ? ?Patient is having constant lower abdominal cramping for the past 3 weeks.  Associated with menstrual bleeding.  States she has an OB appointment coming up in May, not on any blood thinners. ? ?Surgical history notable for LEEP,'s bilateral C-section, colposcopy, tubal ligation. ? ?Home Medications ?Prior to Admission medications   ?Medication Sig Start Date End Date Taking? Authorizing Provider  ?albuterol (VENTOLIN HFA) 108 (90 Base) MCG/ACT inhaler Inhale 1-2 puffs into the lungs every 6 (six) hours as needed for wheezing or shortness of breath. 09/15/21   Olive Bass, FNP  ?Continuous Blood Gluc Receiver (DEXCOM G6 RECEIVER) DEVI See admin instructions. 09/07/20   [provider]  ?Continuous Blood Gluc Sensor (DEXCOM G6 SENSOR) MISC CHANGE SENSOR EVERY 10 DAYS    [provider]  ?Continuous Blood Gluc Transmit (DEXCOM G6 TRANSMITTER) MISC AS DIRECTED FOR 90 DAYS 08/19/21   [provider]  ?DULoxetine (CYMBALTA) 60 MG capsule Take 1 capsule (60 mg total) by mouth daily. 09/15/21   Olive Bass, FNP  ?insulin aspart (NOVOLOG) 100 UNIT/ML injection TAKE AFTER MEALS- 200-250 TAKE 4 UNITS, 251-300 TAKE 6 UNITS, 301-350 8 UNITS , 351-400 10 UNITS. 01/27/20   Cristina Gong, PA-C  ?Insulin Pen Needle 31G X 8 MM MISC As needed for insulin injections  05/17/15   Judeth Horn, NP  ?LANTUS SOLOSTAR 100 UNIT/ML Solostar Pen SMARTSIG:35 Unit(s) SUB-Q Daily 09/08/19   [provider]  ?ondansetron (ZOFRAN ODT) 4 MG disintegrating tablet Take 1 tablet (4 mg total) by mouth every 8 (eight) hours as needed for nausea or vomiting. 11/27/20   Gailen Shelter, PA  ?   ? ?Allergies    ?Banana, Citrus, and Adhesive [tape]   ? ?Review of Systems   ?Review of Systems  ?Genitourinary:  Positive for vaginal bleeding.  ? ?Physical Exam ?Updated Vital Signs ?BP 110/79   Pulse (!) 104   Temp 98.5 ?F (36.9 ?C) (Oral)   Resp 14   Ht 5\' 9"  (1.753 m)   Wt 104.8 kg   LMP 10/07/2021   SpO2 98%   BMI 34.11 kg/m?  ?Physical Exam ?Vitals and nursing note reviewed. Exam conducted with a chaperone present.  ?Constitutional:   ?   Appearance: Normal appearance.  ?HENT:  ?   Head: Normocephalic and atraumatic.  ?Eyes:  ?   General: No scleral icterus.    ?   Right eye: No discharge.     ?   Left eye: No discharge.  ?   Extraocular Movements: Extraocular movements intact.  ?   Pupils: Pupils are equal, round, and reactive to light.  ?Cardiovascular:  ?   Rate and Rhythm: Regular rhythm. Tachycardia present.  ?   Pulses: Normal pulses.  ?   Heart sounds: Normal heart sounds. No murmur heard. ?  No friction rub. No gallop.  ?Pulmonary:  ?  Effort: Pulmonary effort is normal. No respiratory distress.  ?   Breath sounds: Normal breath sounds.  ?Abdominal:  ?   General: Abdomen is flat. Bowel sounds are normal. There is no distension.  ?   Palpations: Abdomen is soft.  ?   Tenderness: There is abdominal tenderness.  ?   Comments: Diffuse lower abdominal tenderness.  ?Skin: ?   General: Skin is warm and dry.  ?   Coloration: Skin is not jaundiced.  ?Neurological:  ?   Mental Status: She is alert. Mental status is at baseline.  ?   Coordination: Coordination normal.  ? ? ?ED Results / Procedures / Treatments   ?Labs ?(all labs ordered are listed, but only abnormal results are  displayed) ?Labs Reviewed  ?CBC WITH DIFFERENTIAL/PLATELET - Abnormal; Notable for the following components:  ?    Result Value  ? Hemoglobin 9.9 (*)   ? HCT 31.6 (*)   ? MCV 75.4 (*)   ? MCH 23.6 (*)   ? All other components within normal limits  ?COMPREHENSIVE METABOLIC PANEL - Abnormal; Notable for the following components:  ? Sodium 132 (*)   ? Glucose, Bld 427 (*)   ? Calcium 8.8 (*)   ? Albumin 3.3 (*)   ? All other components within normal limits  ?URINALYSIS, ROUTINE W REFLEX MICROSCOPIC - Abnormal; Notable for the following components:  ? Glucose, UA >=500 (*)   ? Hgb urine dipstick SMALL (*)   ? Ketones, ur 15 (*)   ? All other components within normal limits  ?URINALYSIS, MICROSCOPIC (REFLEX) - Abnormal; Notable for the following components:  ? Bacteria, UA RARE (*)   ? All other components within normal limits  ?ACETAMINOPHEN LEVEL - Abnormal; Notable for the following components:  ? Acetaminophen (Tylenol), Serum <10 (*)   ? All other components within normal limits  ?CBG MONITORING, ED - Abnormal; Notable for the following components:  ? Glucose-Capillary 387 (*)   ? All other components within normal limits  ?CBG MONITORING, ED - Abnormal; Notable for the following components:  ? Glucose-Capillary 411 (*)   ? All other components within normal limits  ?CBG MONITORING, ED - Abnormal; Notable for the following components:  ? Glucose-Capillary 351 (*)   ? All other components within normal limits  ?LIPASE, BLOOD  ?PREGNANCY, URINE  ?RAPID URINE DRUG SCREEN, HOSP PERFORMED  ?ETHANOL  ? ? ?EKG ?None ? ?Radiology ?US PELVIC COMPLETE W TRANSVAGINAL AND TORSION R/O ? ?Result Date: 10/28/2021 ?CLINICAL DATA:  Pelvic pain, vaginal bleeding EXAM: TRANSVAGINAL ULTRASOUND OF PELVIS DOPPLER ULTRASOUND OF OVARIES TECHNIQUE: Transvaginal ultrasound examination of the pelvis was performed including evaluation of the uterus, ovaries, adnexal regions, and pelvic cul-de-sac. Color and duplex Doppler ultrasound was  utilized to evaluate blood flow to the ovaries. COMPARISON:  None. FINDINGS: Uterus Measurements: 10.1 x 4.9 x 5.8 cm = volume: 153 mL. No fibroids or other mass visualized. Endometrium Thickness: 13 mm.  No focal abnormality visualized. Right ovary Measurements: 3.9 x 4.1 x 4.1 cm = volume: 35.5 mL. 3.0 x 2.9 x 2.7 cm simple cyst/follicle, physiologic. No follow-up recommended. Left ovary Not discretely visualized.  No adnexal mass is seen. Pulsed Doppler evaluation demonstrates normal low-resistance arterial and venous waveforms in the right ovary. Other findings:  No abnormal free fluid IMPRESSION: Normal sonographic appearance of the uterus and right ovary. Left ovary is not discretely visualized.  No adnexal mass is seen. No evidence of right ovarian torsion. Electronically Signed   By:  Charline Bills M.D.   On: 10/28/2021 20:06   ? ?Procedures ?Procedures  ? ? ?Medications Ordered in ED ?Medications  ?lactated ringers bolus 1,000 mL ( Intravenous Stopped 10/28/21 1939)  ?lactated ringers bolus 1,000 mL (0 mLs Intravenous Stopped 10/28/21 2331)  ?insulin aspart (novoLOG) injection 10 Units (10 Units Subcutaneous Given 10/28/21 2139)  ? ? ?ED Course/ Medical Decision Making/ A&P ?  ?                        ?Medical Decision Making ?Amount and/or Complexity of Data Reviewed ?Labs: ordered. ?Radiology: ordered. ? ?Risk ?Prescription drug management. ? ? ?This patient presents to the ED for concern of ingestion/lower abdominal pain/vaginal bleeding, this involves an extensive number of treatment options, and is a complaint that carries with it a high risk of complications and morbidity.  The differential diagnosis includes not limited to hemorrhagic cyst, DKA, overdose, serotonin syndrome, AKI, dehydration, unstable anemia ? ?Patient?s presentation is complicated by their history of diabetes. ? ? ?Additional history obtained:  ? ?Independent historian: sister, fiance ?Reviewed her family medicine note, she has a  pelvic ultrasound coming up with an OB visit in May. ?  ?Lab Tests: ? ?I ordered, viewed, and personally interpreted labs.  The pertinent results include:   ?No leukocytosis, stable anemia with a hemoglobin of 9.9.  11 months ago 11

## 2021-10-28 NOTE — ED Triage Notes (Signed)
Pt arrives pov, c/o lower abdominal pain x 3 weeks, took flexeril for cramping. Pt reports taking ~ 7 between 12pm and 1730. Pt alert, able to speak in complete sentences. Pt endorses feeling sleepy, last took 3 flexeril ~ 30 mins pta. ?

## 2021-10-28 NOTE — ED Notes (Signed)
US at bedside

## 2021-10-28 NOTE — ED Provider Notes (Signed)
Care assumed from [facility], PA-C/Erin Schlossman MD - patient with unintentional overdose of cyclobenzaprine, being observed until she is able to ambulate safely. ? ?3:18 AM ?She is now awake and alert and able to ambulate without difficulty and is felt to be safe for discharge. ?  ?Christie Booze, MD ?10/29/21 [id number] ? ?

## 2021-10-28 NOTE — Discharge Instructions (Addendum)
1-avoid taking too much of the Flexeril.  Take Motrin and Tylenol for the pain, do not exceed 2400 mg of Motrin daily.  Do not exceed 3000 mg of Tylenol daily. ?2-your workup today is overall reassuring. You have a right ovarian cyst.   ?3-follow-up with your gynecologist as planned in May.  Call your primary and let them know you are seen in the ED today and see if they can see you next week for reevaluation. ?4-return to the ED if things worsen or change. ?

## 2021-10-28 NOTE — ED Notes (Signed)
Pt c/o feeling restless and anxious; EDP made aware ?

## 2021-11-03 ENCOUNTER — Encounter: Payer: Self-pay | Admitting: Family Medicine

## 2021-11-03 ENCOUNTER — Ambulatory Visit: Payer: Medicaid Other | Admitting: Family Medicine

## 2021-11-03 VITALS — BP 125/75 | HR 91 | Ht 69.0 in | Wt 234.0 lb

## 2021-11-03 DIAGNOSIS — D500 Iron deficiency anemia secondary to blood loss (chronic): Secondary | ICD-10-CM

## 2021-11-03 DIAGNOSIS — N939 Abnormal uterine and vaginal bleeding, unspecified: Secondary | ICD-10-CM | POA: Diagnosis not present

## 2021-11-03 NOTE — Progress Notes (Signed)
? ?  Subjective:  ? ? Patient ID: Christie [name], female    DOB: [birth year], 36 y.o.   MRN: [id number] ? ?HPI ?36 year old G4 P3-0-1-3 was referred to our office due to AUB.  The patient's normal cycles are approximately every 28 to 30 days.  She has 8 days of bleeding with the first days extremely heavy, after which the remaining 4 days are fairly light.  She does feel fairly tired during her periods. ? ?Approximately 3 weeks ago, the patient began having increased and consistent bleeding.  She had a lot of cramps.  She was seen in the emergency department and had an ultrasound which showed an endometrial thickness of 1.3 cm.  She did have a right simple ovarian cyst approximately 3 cm in diameter.  Left ovary not visualized.  She also had a CBC done which showed a hemoglobin of 9.9 with decreased MCV and MCH. ? ?Her bleeding stopped yesterday.  She has never had any previous episodes. ? ?Review of Systems ? ?   ?Objective:  ? Physical Exam ?Vitals and nursing note reviewed.  ?Constitutional:   ?   Appearance: Normal appearance.  ?Cardiovascular:  ?   Rate and Rhythm: Normal rate and regular rhythm.  ?Pulmonary:  ?   Effort: Pulmonary effort is normal.  ?Abdominal:  ?   General: Abdomen is flat.  ?Skin: ?   General: Skin is warm.  ?   Capillary Refill: Capillary refill takes less than 2 seconds.  ?Neurological:  ?   General: No focal deficit present.  ?   Mental Status: She is alert.  ?Psychiatric:     ?   Mood and Affect: Mood normal.     ?   Behavior: Behavior normal.     ?   Thought Content: Thought content normal.     ?   Judgment: Judgment normal.  ? ? ?   ?Assessment & Plan:  ?1. Abnormal uterine bleeding (AUB) ?Bleeding stopped. Patient will contact us if starts to have prolonged bleeding.  ? ?2. Chronic blood loss anemia ?Start oral iron with vitamin C every other day. ? ? ?

## 2021-11-03 NOTE — Progress Notes (Signed)
Patient states she was on her last cycle for three weeks. This is the first occurrence. Patient had imaging done through the ED on 04/22/23[facility] RN  ?

## 2021-11-12 ENCOUNTER — Other Ambulatory Visit (HOSPITAL_COMMUNITY)
Admission: EM | Admit: 2021-11-12 | Discharge: 2021-11-15 | Disposition: A | Discharge status: Home / Self Care | Payer: Medicaid Other | Attending: Psychiatry | Admitting: Psychiatry

## 2021-11-12 DIAGNOSIS — F332 Major depressive disorder, recurrent severe without psychotic features: Secondary | ICD-10-CM | POA: Diagnosis present

## 2021-11-12 DIAGNOSIS — Z20822 Contact with and (suspected) exposure to covid-19: Secondary | ICD-10-CM | POA: Diagnosis not present

## 2021-11-12 DIAGNOSIS — J45909 Unspecified asthma, uncomplicated: Secondary | ICD-10-CM | POA: Insufficient documentation

## 2021-11-12 DIAGNOSIS — E109 Type 1 diabetes mellitus without complications: Secondary | ICD-10-CM | POA: Insufficient documentation

## 2021-11-12 DIAGNOSIS — F4323 Adjustment disorder with mixed anxiety and depressed mood: Secondary | ICD-10-CM | POA: Diagnosis not present

## 2021-11-12 DIAGNOSIS — F319 Bipolar disorder, unspecified: Secondary | ICD-10-CM | POA: Diagnosis not present

## 2021-11-12 DIAGNOSIS — Z794 Long term (current) use of insulin: Secondary | ICD-10-CM | POA: Diagnosis not present

## 2021-11-12 LAB — COMPREHENSIVE METABOLIC PANEL
ALT: 17 U/L (ref 0–44)
AST: 19 U/L (ref 15–41)
Albumin: 3.1 g/dL — ABNORMAL LOW (ref 3.5–5.0)
Alkaline Phosphatase: 69 U/L (ref 38–126)
Anion gap: 7 (ref 5–15)
BUN: 8 mg/dL (ref 6–20)
CO2: 27 mmol/L (ref 22–32)
Calcium: 9.2 mg/dL (ref 8.9–10.3)
Chloride: 101 mmol/L (ref 98–111)
Creatinine, Ser: 0.69 mg/dL (ref 0.44–1.00)
GFR, Estimated: >60 mL/min (ref >60)
Glucose, Bld: 257 mg/dL — ABNORMAL HIGH (ref 70–99)
Potassium: 3.7 mmol/L (ref 3.5–5.1)
Sodium: 135 mmol/L (ref 135–145)
Total Bilirubin: 0.6 mg/dL (ref 0.3–1.2)
Total Protein: 6.4 g/dL — ABNORMAL LOW (ref 6.5–8.1)

## 2021-11-12 LAB — ETHANOL: Alcohol, Ethyl (B): <10 mg/dL (ref <10)

## 2021-11-12 LAB — CBC WITH DIFFERENTIAL/PLATELET
Abs Immature Granulocytes: 0.01 10*3/uL (ref 0.00–0.07)
Basophils Absolute: 0 10*3/uL (ref 0.0–0.1)
Basophils Relative: 0 %
Eosinophils Absolute: 0 10*3/uL (ref 0.0–0.5)
Eosinophils Relative: 0 %
HCT: 31.7 % — ABNORMAL LOW (ref 36.0–46.0)
Hemoglobin: 9.7 g/dL — ABNORMAL LOW (ref 12.0–15.0)
Immature Granulocytes: 0 %
Lymphocytes Relative: 45 %
Lymphs Abs: 2.8 10*3/uL (ref 0.7–4.0)
MCH: 22.8 pg — ABNORMAL LOW (ref 26.0–34.0)
MCHC: 30.6 g/dL (ref 30.0–36.0)
MCV: 74.6 fL — ABNORMAL LOW (ref 80.0–100.0)
Monocytes Absolute: 0.5 10*3/uL (ref 0.1–1.0)
Monocytes Relative: 8 %
Neutro Abs: 2.9 10*3/uL (ref 1.7–7.7)
Neutrophils Relative %: 47 %
Platelets: 255 10*3/uL (ref 150–400)
RBC: 4.25 MIL/uL (ref 3.87–5.11)
RDW: 14.6 % (ref 11.5–15.5)
WBC: 6.2 10*3/uL (ref 4.0–10.5)
nRBC: 0 % (ref 0.0–0.2)

## 2021-11-12 LAB — POCT URINE DRUG SCREEN - MANUAL ENTRY (I-SCREEN)
POC Amphetamine UR: NOT DETECTED
POC Buprenorphine (BUP): NOT DETECTED
POC Cocaine UR: NOT DETECTED
POC Marijuana UR: NOT DETECTED
POC Methadone UR: NOT DETECTED
POC Methamphetamine UR: NOT DETECTED
POC Morphine: NOT DETECTED
POC Oxazepam (BZO): NOT DETECTED
POC Oxycodone UR: NOT DETECTED
POC Secobarbital (BAR): NOT DETECTED

## 2021-11-12 LAB — LIPID PANEL
Cholesterol: 233 mg/dL — ABNORMAL HIGH (ref 0–200)
HDL: 88 mg/dL (ref >40)
LDL Cholesterol: 127 mg/dL — ABNORMAL HIGH (ref 0–99)
Total CHOL/HDL Ratio: 2.6 RATIO
Triglycerides: 88 mg/dL (ref <150)
VLDL: 18 mg/dL (ref 0–40)

## 2021-11-12 LAB — RESP PANEL BY RT-PCR (FLU A&B, COVID) ARPGX2
Influenza A by PCR: NEGATIVE
Influenza B by PCR: NEGATIVE
SARS Coronavirus 2 by RT PCR: NEGATIVE

## 2021-11-12 LAB — GLUCOSE, CAPILLARY: Glucose-Capillary: 268 mg/dL — ABNORMAL HIGH (ref 70–99)

## 2021-11-12 LAB — POC SARS CORONAVIRUS 2 AG: SARSCOV2ONAVIRUS 2 AG: NEGATIVE

## 2021-11-12 LAB — POCT PREGNANCY, URINE: Preg Test, Ur: NEGATIVE

## 2021-11-12 LAB — TSH: TSH: 2.919 u[IU]/mL (ref 0.350–4.500)

## 2021-11-12 LAB — HEMOGLOBIN A1C
Hgb A1c MFr Bld: 10.2 % — ABNORMAL HIGH (ref 4.8–5.6)
Mean Plasma Glucose: 246.04 mg/dL

## 2021-11-12 MED ORDER — ALUM & MAG HYDROXIDE-SIMETH 200-200-20 MG/5ML PO SUSP: 30.0000 mL | ORAL | Status: DC | PRN

## 2021-11-12 MED ORDER — TRAZODONE HCL 50 MG PO TABS
50.0000 mg | ORAL_TABLET | Freq: Every evening | ORAL | Status: DC | PRN
  Administered 2021-11-13 – 2021-11-14 (×2): 50 mg via ORAL
  Filled 2021-11-12 (×2): qty 1
  Filled 2021-11-12: qty 7

## 2021-11-12 MED ORDER — INSULIN ASPART 100 UNIT/ML IJ SOLN
0.0000 [IU] | Freq: Three times a day (TID) | INTRAMUSCULAR | Status: DC
  Administered 2021-11-13: 11 [IU] via SUBCUTANEOUS
  Administered 2021-11-13: 8 [IU] via SUBCUTANEOUS
  Administered 2021-11-13: 2 [IU] via SUBCUTANEOUS

## 2021-11-12 MED ORDER — INSULIN ASPART 100 UNIT/ML IJ SOLN
4.0000 [IU] | Freq: Three times a day (TID) | INTRAMUSCULAR | Status: DC
  Administered 2021-11-13 (×3): 4 [IU] via SUBCUTANEOUS

## 2021-11-12 MED ORDER — HYDROXYZINE HCL 25 MG PO TABS: 25.0000 mg | ORAL_TABLET | Freq: Three times a day (TID) | ORAL | Status: DC | PRN

## 2021-11-12 MED ORDER — INSULIN GLARGINE-YFGN 100 UNIT/ML ~~LOC~~ SOLN
35.0000 [IU] | Freq: Every day | SUBCUTANEOUS | Status: DC
  Administered 2021-11-12 – 2021-11-13 (×2): 35 [IU] via SUBCUTANEOUS

## 2021-11-12 MED ORDER — ACETAMINOPHEN 325 MG PO TABS: 650.0000 mg | ORAL_TABLET | Freq: Four times a day (QID) | ORAL | Status: DC | PRN

## 2021-11-12 MED ORDER — MAGNESIUM HYDROXIDE 400 MG/5ML PO SUSP: 30.0000 mL | Freq: Every day | ORAL | Status: DC | PRN
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
## 2021-11-12 NOTE — ED Notes (Signed)
Pending 2 Hr COVID and transfer to [facility].  Monitoring for safety. ?

## 2021-11-13 ENCOUNTER — Other Ambulatory Visit: Payer: Self-pay

## 2021-11-13 DIAGNOSIS — F319 Bipolar disorder, unspecified: Secondary | ICD-10-CM | POA: Diagnosis not present

## 2021-11-13 DIAGNOSIS — J45909 Unspecified asthma, uncomplicated: Secondary | ICD-10-CM | POA: Diagnosis not present

## 2021-11-13 DIAGNOSIS — Z794 Long term (current) use of insulin: Secondary | ICD-10-CM | POA: Diagnosis not present

## 2021-11-13 DIAGNOSIS — Z20822 Contact with and (suspected) exposure to covid-19: Secondary | ICD-10-CM | POA: Diagnosis not present

## 2021-11-13 DIAGNOSIS — F4323 Adjustment disorder with mixed anxiety and depressed mood: Secondary | ICD-10-CM | POA: Diagnosis not present

## 2021-11-13 DIAGNOSIS — E109 Type 1 diabetes mellitus without complications: Secondary | ICD-10-CM | POA: Diagnosis not present

## 2021-11-13 LAB — GLUCOSE, CAPILLARY
Glucose-Capillary: 419 mg/dL — ABNORMAL HIGH (ref 70–99)
Glucose-Capillary: 401 mg/dL — ABNORMAL HIGH (ref 70–99)
Glucose-Capillary: 299 mg/dL — ABNORMAL HIGH (ref 70–99)
Glucose-Capillary: 310 mg/dL — ABNORMAL HIGH (ref 70–99)
Glucose-Capillary: 143 mg/dL — ABNORMAL HIGH (ref 70–99)
Glucose-Capillary: 369 mg/dL — ABNORMAL HIGH (ref 70–99)

## 2021-11-13 MED ORDER — INSULIN PUMP
Freq: Three times a day (TID) | SUBCUTANEOUS | Status: DC
  Administered 2021-11-14: 1 via SUBCUTANEOUS
  Filled 2021-11-13: qty 1

## 2021-11-13 MED ORDER — ALBUTEROL SULFATE HFA 108 (90 BASE) MCG/ACT IN AERS: 1.0000 | INHALATION_SPRAY | Freq: Four times a day (QID) | RESPIRATORY_TRACT | Status: DC | PRN

## 2021-11-13 MED ORDER — HYDROXYZINE HCL 10 MG PO TABS
10.0000 mg | ORAL_TABLET | Freq: Three times a day (TID) | ORAL | Status: DC | PRN
  Administered 2021-11-13 – 2021-11-14 (×2): 10 mg via ORAL
  Filled 2021-11-13: qty 15
  Filled 2021-11-13 (×2): qty 1

## 2021-11-13 MED ORDER — ASCORBIC ACID 500 MG PO TABS
500.0000 mg | ORAL_TABLET | Freq: Every day | ORAL | Status: DC
  Administered 2021-11-13 – 2021-11-15 (×3): 500 mg via ORAL
  Filled 2021-11-13 (×3): qty 1

## 2021-11-13 MED ORDER — INSULIN ASPART 100 UNIT/ML IJ SOLN
10.0000 [IU] | Freq: Once | INTRAMUSCULAR | Status: AC
  Administered 2021-11-13: 10 [IU] via SUBCUTANEOUS

## 2021-11-13 MED ORDER — FERROUS SULFATE 325 (65 FE) MG PO TABS
325.0000 mg | ORAL_TABLET | Freq: Every day | ORAL | Status: DC
  Administered 2021-11-13 – 2021-11-15 (×3): 325 mg via ORAL
  Filled 2021-11-13 (×3): qty 1

## 2021-11-13 MED ORDER — DULOXETINE HCL 60 MG PO CPEP
90.0000 mg | ORAL_CAPSULE | Freq: Every day | ORAL | Status: DC
  Administered 2021-11-13 – 2021-11-15 (×3): 90 mg via ORAL
  Filled 2021-11-13 (×3): qty 1

## 2021-11-13 NOTE — ED Notes (Signed)
Patient wanted to go over the booklet I made for them, Titled Wellness, it had 8 Dimensions of Wellness along with worksheets, it also covered Nutritional Wellness and the connection between brain and food and how food affects the way you feel. Self-Care and Healthy Sleeping Habits along with worksheets. ?

## 2021-11-13 NOTE — Progress Notes (Signed)
Patient resting in bed without issue.  She met with provider earlier.  No complaints.  Patient CBG = 310 and received 15 units of insulin.  RN spoke with M.D. and with [facility] nurse manager of unit and it has been approved for patient to bring her insulin pump in and use it.  Patient reports that CBG are always elevated when she does not use insulin pump.  Patient informed her husband and he is to bring it to facility as soon as he is able.   ?

## 2021-11-13 NOTE — ED Notes (Signed)
Pt is in the be awake. Respirations are even and unlabored. No acute distress noted will continue to monitor for safety. ?

## 2021-11-13 NOTE — ED Notes (Signed)
Pt blood sugar level is elevated. Provider notified. Will continue to monitor for safety. ?

## 2021-11-13 NOTE — Progress Notes (Signed)
Inpatient Diabetes Program Recommendations ? ?AACE/ADA: New Consensus Statement on Inpatient Glycemic Control (2015) ? ?Target Ranges:  Prepandial:   less than 140 mg/dL ?     Peak postprandial:   less than 180 mg/dL (1-2 hours) ?     Critically ill patients:  140 - 180 mg/dL  ? ?Lab Results  ?Component Value Date  ? GLUCAP 299 (H) 11/13/2021  ? HGBA1C 10.2 (H) 11/12/2021  ? ? ?Review of Glycemic Control ? ?Diabetes history: DM1 ?Outpatient Diabetes medications: Insulin pump ?Current orders for Inpatient glycemic control: Lantus 35 units QHS, Novolog 0-15 TID with meals and 0-5 HS + 4 units TID ? ?Inpatient Diabetes Program Recommendations:   ? ?Agree with orders at present. Do not have enough information for insulin titration. ? ?Per [facility] insulin pumps are usually contraindicated. ? ?Will follow glucose trends.  ? ?Thank you. ?Ailene Ards, RD, LDN, CDE ?Inpatient Diabetes Coordinator ?[phone]  ? ? ? ? ? ? ? ? ?

## 2021-11-13 NOTE — ED Notes (Signed)
Pt is admitted to [facility] endorsing SI with no plan and worsening depression. Pt denies SI, HI, AVH at present. Patient was cooperative during the admission assessment. Skin assessment complete. Belongings inventoried. Patient oriented to unit and unit rules. Meal and drinks offered to patient.  Patient verbalized agreement to treatment plans. Patient verbally contracts for safety while hospitalized. Will monitor for safety.  ?
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
## 2021-11-13 NOTE — ED Notes (Signed)
Patient awake and alert on unit this morning.  CBG = 299 12 units insulin given.  Patient calm and cooperative with care.  She denies avh shi or plan.  Patient has insulin pump which she was was informed she could not have on unit.  Will clarify with M.D.  Will monitor patient and provide support as needed. ?

## 2021-11-13 NOTE — ED Notes (Signed)
Pt in dayroom calm and cooperative watching television. No c/o pain or distress. Will continue to monitor for safety  ?

## 2021-11-13 NOTE — ED Notes (Signed)
CBG 369 
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
## 2021-11-14 DIAGNOSIS — F319 Bipolar disorder, unspecified: Secondary | ICD-10-CM | POA: Diagnosis not present

## 2021-11-14 DIAGNOSIS — Z20822 Contact with and (suspected) exposure to covid-19: Secondary | ICD-10-CM | POA: Diagnosis not present

## 2021-11-14 DIAGNOSIS — E109 Type 1 diabetes mellitus without complications: Secondary | ICD-10-CM | POA: Diagnosis not present

## 2021-11-14 DIAGNOSIS — J45909 Unspecified asthma, uncomplicated: Secondary | ICD-10-CM | POA: Diagnosis not present

## 2021-11-14 DIAGNOSIS — F4323 Adjustment disorder with mixed anxiety and depressed mood: Secondary | ICD-10-CM | POA: Diagnosis not present

## 2021-11-14 DIAGNOSIS — Z794 Long term (current) use of insulin: Secondary | ICD-10-CM | POA: Diagnosis not present

## 2021-11-14 MED ORDER — HYDROXYZINE HCL 10 MG PO TABS: 10.0000 mg | ORAL_TABLET | Freq: Three times a day (TID) | ORAL | 0 refills | Status: DC | PRN

## 2021-11-14 MED ORDER — TRAZODONE HCL 50 MG PO TABS: 50.0000 mg | ORAL_TABLET | Freq: Every evening | ORAL | 0 refills | Status: DC | PRN

## 2021-11-14 MED ORDER — LORATADINE 10 MG PO TABS
10.0000 mg | ORAL_TABLET | Freq: Once | ORAL | Status: AC
  Administered 2021-11-14: 10 mg via ORAL
  Filled 2021-11-14: qty 1

## 2021-11-14 MED ORDER — DULOXETINE HCL 30 MG PO CPEP
90.0000 mg | ORAL_CAPSULE | Freq: Every day | ORAL | Status: DC
  Filled 2021-11-14: qty 21

## 2021-11-14 MED ORDER — DULOXETINE HCL 30 MG PO CPEP: 90.0000 mg | ORAL_CAPSULE | Freq: Every day | ORAL | 0 refills | Status: DC

## 2021-11-14 NOTE — Progress Notes (Signed)
Received Christie [name] this AM asleep in her bed, she woke up on her own and received breakfast. She returned to her room and drifted beck off to sleep. Later she woke up and was medicated per order. She denied all of the psychiatric symptoms. ?

## 2021-11-14 NOTE — ED Notes (Signed)
PT declined AA she is currently on the unit using the phone. ?

## 2021-11-14 NOTE — ED Notes (Signed)
Pt has taken night time medications and is now lying in room quietly drawing ?

## 2021-11-14 NOTE — ED Provider Notes (Signed)
Behavioral Health Progress Note ? ?Date and Time: 11/14/2021 12:14 PM ?Name: Christie [name] ?MRN:  [id number] ? ?Subjective:   ?37 yo female with a past psychiatric history of MDD and bipolar disorder and medical h/o asthma and type 1 DM who presented to the [facility] 5/8 for worsening depressive symptoms; she was admitted to the [facility] for crisis stabilization. Uds negative; etoh negative.  ? ?Patient seen and chart reviewed. She has been medication compliant and has been appropriate with staff and peers on the unit.  Patient interviewed this morning in the day room.  Patient is calm, cooperative and pleasant.  She displays bright affect.  She describes her mood as "good".  She reports sleeping well and eating well.  She states that last night was the best she had slept "in awhile".  She rates her anxiety 0/10 (10 being the most).  She states that she has spoken to multiple family members including her husband and her mother and expresses that she has found them to be supportive.  She denies SI/HI/AVH.  Patient requests to be discharged tomorrow with outpatient resources.  Discussed with patient that upon discharge she will be provided with 7-day samples as well as 30-day prescriptions for her medications-patient verbalized understanding and was in agreement.  Patient denies all physical complaints today. Patient was given the opportunity to ask questions and  All questions answered. Patient verbalized understanding regarding plan of care.  ? ? ? ? ?Diagnosis:  ?Final diagnoses:  ?MDD (major depressive disorder), recurrent severe, without psychosis ([place])  ?Adjustment disorder with mixed anxiety and depressed mood  ? ? ?Total Time spent with patient: 20 minutes ? ?Past Psychiatric History: mdd per patient; bipolar per chart ?Past Medical History:  ?Past Medical History:  ?Diagnosis Date  ? Abnormal Pap smear   ? colpo scheduled  ? Alpha thalassemia trait   ? Anemia   ? Anxiety   ? Asthma   ? Bipolar 1 disorder ([place])    ? Chlamydia   ? Depression   ? Diabetes mellitus   ? type 1 on insulin  ? Fx ankle   ? history of left ankle fx at age 20  ? Headache(784.0)   ? Hx of migraines   ? Pregnancy induced hypertension   ? Urinary tract infection   ?  ?Past Surgical History:  ?Procedure Laterality Date  ? CESAREAN SECTION Bilateral 08/21/2018  ? Procedure: CESAREAN SECTION WITH BILATERAL TUBAL LIGATION;  Surgeon: Sloan Leiter, MD;  Location: [facility];  Service: Obstetrics;  Laterality: Bilateral;  ? COLPOSCOPY    ? 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 TUBAL LIGATION    ? WISDOM TOOTH EXTRACTION    ? ?Family History:  ?Family History  ?Problem Relation Age of Onset  ? Asthma Mother   ? Diabetes Mother   ? Hypertension Mother   ? Asthma Father   ? Hypertension Sister   ? Diabetes Maternal Grandmother   ? Cancer Maternal Grandmother   ?     breast  ? Obesity Other   ? Sleep apnea Other   ? Anesthesia problems Neg Hx   ? ?Family Psychiatric  History:  ?Maternal family h/o depression, bipolar, anxiety ?Social History:  ?Social History  ? ?Substance and Sexual Activity  ?Alcohol Use Yes  ? Comment: occ  ?   ?Social History  ? ?Substance and Sexual Activity  ?Drug Use No  ? Comment: last used 1 years ago  ?  ?  Social History  ? ?Socioeconomic History  ? Marital status: Single  ?  Spouse name: Not on file  ? Number of children: Not on file  ? Years of education: Not on file  ? Highest education level: Not on file  ?Occupational History  ? Not on file  ?Tobacco Use  ? Smoking status: Never  ?  Passive exposure: Never  ? Smokeless tobacco: Never  ?Vaping Use  ? Vaping Use: Never used  ?Substance and Sexual Activity  ? Alcohol use: Yes  ?  Comment: occ  ? Drug use: No  ?  Comment: last used 1 years ago  ? Sexual activity: Yes  ?  Birth control/protection: Surgical  ?Other Topics Concern  ? Not on file  ?Social History Narrative  ? Not on file  ? ?Social Determinants of Health  ? ?Financial Resource Strain: Not on file  ?Food Insecurity: Not  on file  ?Transportation Needs: Not on file  ?Physical Activity: Not on file  ?Stress: Not on file  ?Social Connections: Not on file  ? ?SDOH:  ?SDOH Screenings  ? ?Alcohol Screen: Not on file  ?Depression (PHQ2-9): Medium Risk  ? PHQ-2 Score: 23  ?Financial Resource Strain: Not on file  ?Food Insecurity: Not on file  ?Housing: Not on file  ?Physical Activity: Not on file  ?Social Connections: Not on file  ?Stress: Not on file  ?Tobacco Use: Low Risk   ? Smoking Tobacco Use: Never  ? Smokeless Tobacco Use: Never  ? Passive Exposure: Never  ?Transportation Needs: Not on file  ? ?Additional Social History:  ?  ?Pain Medications: Denies abuse ?Prescriptions: Denies abuse ?Over the Counter: Denies abuse ?History of alcohol / drug use?: No history of alcohol / drug abuse ?Longest period of sobriety (when/how long): NA ?  ?  ?  ?  ?  ?  ?  ?  ?  ? ?Sleep: Poor ? ?Appetite:  Fair ? ?Current Medications:  ?Current Facility-Administered Medications  ?Medication Dose Route Frequency Provider Last Rate Last Admin  ? acetaminophen (TYLENOL) tablet 650 mg  650 mg Oral Q6H PRN Ajibola, Ene A, NP      ? albuterol (VENTOLIN HFA) 108 (90 Base) MCG/ACT inhaler 1-2 puff  1-2 puff Inhalation Q6H PRN Ival Bible, MD      ? alum & mag hydroxide-simeth (MAALOX/MYLANTA) 200-200-20 MG/5ML suspension 30 mL  30 mL Oral Q4H PRN Ajibola, Ene A, NP      ? ascorbic acid (VITAMIN C) tablet 500 mg  500 mg Oral Q breakfast Ajibola, Ene A, NP   500 mg at 11/14/21 1041  ? DULoxetine (CYMBALTA) DR capsule 90 mg  90 mg Oral Daily Ival Bible, MD   90 mg at 11/14/21 1041  ? ferrous sulfate tablet 325 mg  325 mg Oral Q breakfast Ajibola, Ene A, NP   325 mg at 11/14/21 1042  ? hydrOXYzine (ATARAX) tablet 10 mg  10 mg Oral TID PRN Ival Bible, MD   10 mg at 11/13/21 2116  ? insulin aspart (novoLOG) injection 0-15 Units  0-15 Units Subcutaneous TID WC Ajibola, Ene A, NP   2 Units at 11/13/21 1702  ? insulin aspart (novoLOG)  injection 4 Units  4 Units Subcutaneous TID WC Ajibola, Ene A, NP   4 Units at 11/13/21 1702  ? insulin glargine-yfgn (SEMGLEE) injection 35 Units  35 Units Subcutaneous QHS Ajibola, Ene A, NP   35 Units at 11/13/21 2130  ? insulin pump  Subcutaneous TID WC, HS, 0200 Ival Bible, MD   Given at 11/14/21 1159  ? magnesium hydroxide (MILK OF MAGNESIA) suspension 30 mL  30 mL Oral Daily PRN Ajibola, Ene A, NP      ? traZODone (DESYREL) tablet 50 mg  50 mg Oral QHS PRN Ajibola, Ene A, NP   50 mg at 11/13/21 2116  ? ?Current Outpatient Medications  ?Medication Sig Dispense Refill  ? albuterol (VENTOLIN HFA) 108 (90 Base) MCG/ACT inhaler Inhale 1-2 puffs into the lungs every 6 (six) hours as needed for wheezing or shortness of breath. 1 each 1  ? aspirin-acetaminophen-caffeine (EXCEDRIN MIGRAINE) 250-250-65 MG tablet Take 2 tablets by mouth every 6 (six) hours as needed for headache or migraine.    ? Continuous Blood Gluc Receiver ([place]) DEVI See admin instructions.    ? Continuous Blood Gluc Sensor (DEXCOM G6 SENSOR) MISC CHANGE SENSOR EVERY 10 DAYS    ? Continuous Blood Gluc Transmit (DEXCOM G6 TRANSMITTER) MISC AS DIRECTED FOR 90 DAYS    ? DULoxetine (CYMBALTA) 60 MG capsule Take 1 capsule (60 mg total) by mouth daily. 90 capsule 1  ? insulin aspart (NOVOLOG) 100 UNIT/ML injection TAKE AFTER MEALS- 200-250 TAKE 4 UNITS, 251-300 TAKE 6 UNITS, 301-350 8 UNITS , 351-400 10 UNITS. (Patient taking differently: Inject into the skin 3 (three) times daily after meals. She uses a sliding scale.) 20 mL 2  ? Insulin Pen Needle 31G X 8 MM MISC As needed for insulin injections 100 each 1  ? LANTUS SOLOSTAR 100 UNIT/ML Solostar Pen 35 Units at bedtime.    ? ? ?Labs  ?Lab Results:  ?Admission on 11/12/2021  ?Component Date Value Ref Range Status  ? SARS Coronavirus 2 by RT PCR 11/12/2021 NEGATIVE  NEGATIVE Final  ? Comment: (NOTE) ?SARS-CoV-2 target nucleic acids are NOT DETECTED. ? ?The SARS-CoV-2 RNA is  generally detectable in upper respiratory ?specimens during the acute phase of infection. The lowest ?concentration of SARS-CoV-2 viral copies this assay can detect is ?138 copies/mL. A negative result does not p

## 2021-11-14 NOTE — ED Notes (Signed)
Pt sleeping at present, no distress noted. Respirations even & unlabored.  Monitoring for safety. 

## 2021-11-14 NOTE — ED Notes (Signed)
Pt sleeping@this time. Breathing even and unlabored. Will continue to monitor for safety 

## 2021-11-14 NOTE — ED Notes (Signed)
Pt was on telephone talking with family. She hung up from family smiling and in good spirits. She is calm and cooperative. No c/o of pain or distress. Will continue to monitor for safety ?

## 2021-11-14 NOTE — Progress Notes (Addendum)
Kinynetta remained OOB in the milieu throughout the day, coloring  and watching TV at intervals. She denied all of the psychiatric symptoms. Her CBS were 112- 220 and 187 before meals. ?

## 2021-11-14 NOTE — Clinical Social Work Psych Note (Signed)
LCSW Initial Note ? ?LCSW met with Christie [name] for introduction and to begin discussions regarding treatment and potential discharge planning.  ? ?Christie [name] presented with  euthymic affect, congruent mood. She denied having any SI, HI or AVH at this time. Christie [name] also denied having any physical or medications concerns at this time.  ? ?Christie [name] shared that she came to the [facility] seeking assistance for worsening depressive symptoms, increased anxiety and mood instability (emotional outbursts). Christie [name] reports that she has a hard time controlling her emotions. She also shared that her depressive symptoms and anxiety has worsened due to her current living arrangements and responsibilities.  ? ?Christie [name] shared that she has multiple stressors, including living with 11 people (family members) in a town home, taking care of her mother, grieving the lost of her father and family conflicts with her mother and spouse. Christie [name] shared that her spouse is currently staying in a hotel room due to the family conflicts at their home.  ? ?Christie [name] denied having any issues on the [facility] and shared that she was able to get rest and recollect her thoughts being away from her home environment. Christie [name] shared that she plans to discharge home tomorrow with outpatient medication management and therapy services.  ? ?Christie [name] shared that she decided to remain on her previous medications and they appear to be effective in treating her symptoms. Christie [name] denied having any additional questions or concerns at this time.  ? ?LCSW will continue to follow for any additional needs until discharge.  ? ? ?Christie [name], MSW, LCSW ?Clinical Education officer, museum Insurance claims handler) ?[facility]  ? ?

## 2021-11-14 NOTE — ED Notes (Signed)
Pt came to nurses station asking for allergy medication. She states she takes Claritin and Zyrtec at home. This nurse advised patient there is no order for allergy medication, however I will reach out to the provider to see if an order can be put in for the patient. ?

## 2021-11-15 ENCOUNTER — Encounter (HOSPITAL_COMMUNITY): Payer: Self-pay

## 2021-11-15 DIAGNOSIS — F4323 Adjustment disorder with mixed anxiety and depressed mood: Secondary | ICD-10-CM | POA: Diagnosis not present

## 2021-11-15 DIAGNOSIS — Z794 Long term (current) use of insulin: Secondary | ICD-10-CM | POA: Diagnosis not present

## 2021-11-15 DIAGNOSIS — F319 Bipolar disorder, unspecified: Secondary | ICD-10-CM | POA: Diagnosis not present

## 2021-11-15 DIAGNOSIS — J45909 Unspecified asthma, uncomplicated: Secondary | ICD-10-CM | POA: Diagnosis not present

## 2021-11-15 DIAGNOSIS — Z20822 Contact with and (suspected) exposure to covid-19: Secondary | ICD-10-CM | POA: Diagnosis not present

## 2021-11-15 DIAGNOSIS — E109 Type 1 diabetes mellitus without complications: Secondary | ICD-10-CM | POA: Diagnosis not present

## 2021-11-15 NOTE — Progress Notes (Signed)
Received Christie [name] this AM asleep in her bed, she woke up for breakfast. She reported her CBG this AM was 152. The results were recorded per order. She denied all of the psychiatric symptoms and verbalized feeling ready to return home. She remained OOB in the milieu coloring with her pencils and watching the TV. ?

## 2021-11-15 NOTE — Progress Notes (Signed)
Christie [name] talked with the provider and received her discharged order. She was presented with her AVS and questions answered. Her insulin  supplies were returned to her. She received her prescriptions and medications. She retrieved her personal belongings from the locker. Her husband arrived at 1234 hrs to transport her home ?

## 2021-11-15 NOTE — ED Provider Notes (Signed)
FBC/OBS ASAP Discharge Summary ? ?Date and Time: 11/15/2021 10:55 AM  ?Name: Christie [name]  ?MRN:  [id number]  ? ?Discharge Diagnoses:  ?Final diagnoses:  ?MDD (major depressive disorder), recurrent severe, without psychosis ([facility])  ?Adjustment disorder with mixed anxiety and depressed mood  ? ? ?Subjective:  ?Patient seen and chart reviewed-she has been medication compliant and appropriate with staff and peers on the unit.  Patient interviewed this morning in conjunction with LCSW.  Patient describes her mood as "good".  She reports sleeping well and denies issues with appetite.  Patient reports that she feels that she is ready for discharge and has been speaking with her husband about discharge planning.  Patient acknowledges that she has several stressors that led to presentation-primarily the stress of living with her mother.  Patient states that she has a plan to go with her husband this weekend and look at alternate housing options.  Patient reports having a good support system in her husband and older brother who currently lives in [place].  Discussed with patient that she will be provided with 7-day samples as well as 30-day prescriptions for medications that she is received.  Discussed follow-up options with patient-discussed open access hours at the [facility] behavioral health center and informed of additional resources that we are presently in AVS.  Patient verbalized understanding and was in agreement. Patient was given the opportunity to ask questions and  All questions answered. Patient verbalized understanding regarding plan of care.  ? ? ?Stay Summary:  ?36 yo female with a past psychiatric history of MDD and bipolar disorder and medical h/o asthma and type 1 DM who presented to the [facility] 5/8 for worsening depressive symptoms; she was admitted to the [facility] for crisis stabilization. Uds negative; etoh negative. She was restarted on her home medication of cymbalta 90 mg on admission as she  reported that it had been working well for her and described several stressors that compounded over the last several weeks. She was started on trazodone 50 mg qhs prn and hydroxyzine 10 mg TID PRN for anxiety. Patient tolerated re-initation of cymbalta well without issues and requested discharge on 5/10.  ? ?On my interview day of discharge , patient is in NAD, alert, oriented, calm, cooperative, and attentive, with normal affect, speech, and behavior. Objectively, there is no evidence of psychosis/ mania (able to converse coherently, linear and goal directed thought, no RIS, no distractibility, not pre-occupied, no FOI, etc) nor depression to the point of suicidality (able to concentrate, affect full and reactive, speech normal r/v/t, no psychomotor retardation/agitation, etc). She denied SI/HI/AVH.  ? ?Overall, patient appears to be at the point, in the absence of inhibiting or disinhibiting symptoms, where she can successfully move to lesser restrictive setting for care. ? ? ?Total Time spent with patient: 20 minutes ? ?Past Psychiatric History: depression ?Past Medical History:  ?Past Medical History:  ?Diagnosis Date  ? Abnormal Pap smear   ? colpo scheduled  ? Alpha thalassemia trait   ? Anemia   ? Anxiety   ? Asthma   ? Bipolar 1 disorder ([facility])   ? Chlamydia   ? Depression   ? Diabetes mellitus   ? type 1 on insulin  ? Fx ankle   ? history of left ankle fx at age 54  ? Headache(784.0)   ? Hx of migraines   ? Pregnancy induced hypertension   ? Urinary tract infection   ?  ?Past Surgical History:  ?Procedure Laterality Date  ?  CESAREAN SECTION Bilateral 08/21/2018  ? Procedure: CESAREAN SECTION WITH BILATERAL TUBAL LIGATION;  Surgeon: Conan Bowens, MD;  Location: [facility];  Service: Obstetrics;  Laterality: Bilateral;  ? COLPOSCOPY    ? 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 TUBAL LIGATION    ? WISDOM TOOTH EXTRACTION    ? ?Family History:  ?Family History  ?Problem Relation Age of Onset  ? Asthma Mother    ? Diabetes Mother   ? Hypertension Mother   ? Asthma Father   ? Hypertension Sister   ? Diabetes Maternal Grandmother   ? Cancer Maternal Grandmother   ?     breast  ? Obesity Other   ? Sleep apnea Other   ? Anesthesia problems Neg Hx   ? ?Family Psychiatric History:  ?Maternal family h/o depression, bipolar, anxiety ?Social History:  ?Social History  ? ?Substance and Sexual Activity  ?Alcohol Use Yes  ? Comment: occ  ?   ?Social History  ? ?Substance and Sexual Activity  ?Drug Use No  ? Comment: last used 1 years ago  ?  ?Social History  ? ?Socioeconomic History  ? Marital status: Single  ?  Spouse name: Not on file  ? Number of children: Not on file  ? Years of education: Not on file  ? Highest education level: Not on file  ?Occupational History  ? Not on file  ?Tobacco Use  ? Smoking status: Never  ?  Passive exposure: Never  ? Smokeless tobacco: Never  ?Vaping Use  ? Vaping Use: Never used  ?Substance and Sexual Activity  ? Alcohol use: Yes  ?  Comment: occ  ? Drug use: No  ?  Comment: last used 1 years ago  ? Sexual activity: Yes  ?  Birth control/protection: Surgical  ?Other Topics Concern  ? Not on file  ?Social History Narrative  ? Not on file  ? ?Social Determinants of Health  ? ?Financial Resource Strain: Not on file  ?Food Insecurity: Not on file  ?Transportation Needs: Not on file  ?Physical Activity: Not on file  ?Stress: Not on file  ?Social Connections: Not on file  ? ?SDOH:  ?SDOH Screenings  ? ?Alcohol Screen: Not on file  ?Depression (PHQ2-9): Medium Risk  ? PHQ-2 Score: 23  ?Financial Resource Strain: Not on file  ?Food Insecurity: Not on file  ?Housing: Not on file  ?Physical Activity: Not on file  ?Social Connections: Not on file  ?Stress: Not on file  ?Tobacco Use: Low Risk   ? Smoking Tobacco Use: Never  ? Smokeless Tobacco Use: Never  ? Passive Exposure: Never  ?Transportation Needs: Not on file  ? ? ?Tobacco Cessation:  N/A, patient does not currently use tobacco products ? ?Current  Medications:  ?Current Facility-Administered Medications  ?Medication Dose Route Frequency Provider Last Rate Last Admin  ? acetaminophen (TYLENOL) tablet 650 mg  650 mg Oral Q6H PRN Ajibola, Ene A, NP      ? albuterol (VENTOLIN HFA) 108 (90 Base) MCG/ACT inhaler 1-2 puff  1-2 puff Inhalation Q6H PRN Estella Husk, MD      ? alum & mag hydroxide-simeth (MAALOX/MYLANTA) 200-200-20 MG/5ML suspension 30 mL  30 mL Oral Q4H PRN Ajibola, Ene A, NP      ? ascorbic acid (VITAMIN C) tablet 500 mg  500 mg Oral Q breakfast Ajibola, Ene A, NP   500 mg at 11/15/21 0947  ? DULoxetine (CYMBALTA) DR capsule 90 mg  90  mg Oral Daily Estella HuskLaubach, Jesiah Grismer S, MD   90 mg at 11/15/21 [id number]  ? ferrous sulfate tablet 325 mg  325 mg Oral Q breakfast Ajibola, Ene A, NP   325 mg at 11/15/21 0947  ? hydrOXYzine (ATARAX) tablet 10 mg  10 mg Oral TID PRN Estella HuskLaubach, Jeramia Saleeby S, MD   10 mg at 11/14/21 2109  ? insulin aspart (novoLOG) injection 0-15 Units  0-15 Units Subcutaneous TID WC Ajibola, Ene A, NP   2 Units at 11/13/21 1702  ? insulin aspart (novoLOG) injection 4 Units  4 Units Subcutaneous TID WC Ajibola, Ene A, NP   4 Units at 11/13/21 1702  ? insulin glargine-yfgn (SEMGLEE) injection 35 Units  35 Units Subcutaneous QHS Ajibola, Ene A, NP   35 Units at 11/13/21 2130  ? insulin pump   Subcutaneous TID WC, HS, 0200 Estella HuskLaubach, Lyanne Kates S, MD   Given at 11/15/21 0102  ? magnesium hydroxide (MILK OF MAGNESIA) suspension 30 mL  30 mL Oral Daily PRN Ajibola, Ene A, NP      ? traZODone (DESYREL) tablet 50 mg  50 mg Oral QHS PRN Ajibola, Ene A, NP   50 mg at 11/14/21 2109  ? ?Current Outpatient Medications  ?Medication Sig Dispense Refill  ? albuterol (VENTOLIN HFA) 108 (90 Base) MCG/ACT inhaler Inhale 1-2 puffs into the lungs every 6 (six) hours as needed for wheezing or shortness of breath. 1 each 1  ? aspirin-acetaminophen-caffeine (EXCEDRIN MIGRAINE) 250-250-65 MG tablet Take 2 tablets by mouth every 6 (six) hours as needed for headache or  migraine.    ? Continuous Blood Gluc Receiver (DEXCOM G6 RECEIVER) DEVI See admin instructions.    ? Continuous Blood Gluc Sensor (DEXCOM G6 SENSOR) MISC CHANGE SENSOR EVERY 10 DAYS    ? Continuous Blood Glu

## 2021-11-15 NOTE — BH IP Treatment Plan (Unsigned)
Interdisciplinary Treatment and Diagnostic Plan Update ? ?11/15/2021 ?Time of Session: 10:00AM ? ?Christie [name] ?MRN: [id number] ? ?Diagnosis:  ?Final diagnoses:  ?MDD (major depressive disorder), recurrent severe, without psychosis ([place])  ?Adjustment disorder with mixed anxiety and depressed mood  ? ? ? ?Current Medications:  ?Current Facility-Administered Medications  ?Medication Dose Route Frequency Provider Last Rate Last Admin  ? acetaminophen (TYLENOL) tablet 650 mg  650 mg Oral Q6H PRN Ajibola, Ene A, NP      ? albuterol (VENTOLIN HFA) 108 (90 Base) MCG/ACT inhaler 1-2 puff  1-2 puff Inhalation Q6H PRN Ival Bible, MD      ? alum & mag hydroxide-simeth (MAALOX/MYLANTA) 200-200-20 MG/5ML suspension 30 mL  30 mL Oral Q4H PRN Ajibola, Ene A, NP      ? ascorbic acid (VITAMIN C) tablet 500 mg  500 mg Oral Q breakfast Ajibola, Ene A, NP   500 mg at 11/15/21 0947  ? DULoxetine (CYMBALTA) DR capsule 90 mg  90 mg Oral Daily Ival Bible, MD   90 mg at 11/15/21 0946  ? ferrous sulfate tablet 325 mg  325 mg Oral Q breakfast Ajibola, Ene A, NP   325 mg at 11/15/21 0947  ? hydrOXYzine (ATARAX) tablet 10 mg  10 mg Oral TID PRN Ival Bible, MD   10 mg at 11/14/21 2109  ? insulin aspart (novoLOG) injection 0-15 Units  0-15 Units Subcutaneous TID WC Ajibola, Ene A, NP   2 Units at 11/13/21 1702  ? insulin aspart (novoLOG) injection 4 Units  4 Units Subcutaneous TID WC Ajibola, Ene A, NP   4 Units at 11/13/21 1702  ? insulin glargine-yfgn (SEMGLEE) injection 35 Units  35 Units Subcutaneous QHS Ajibola, Ene A, NP   35 Units at 11/13/21 2130  ? insulin pump   Subcutaneous TID WC, HS, 0200 Ival Bible, MD   Given at 11/15/21 0102  ? magnesium hydroxide (MILK OF MAGNESIA) suspension 30 mL  30 mL Oral Daily PRN Ajibola, Ene A, NP      ? traZODone (DESYREL) tablet 50 mg  50 mg Oral QHS PRN Ajibola, Ene A, NP   50 mg at 11/14/21 2109  ? ?Current Outpatient Medications  ?Medication Sig Dispense  Refill  ? albuterol (VENTOLIN HFA) 108 (90 Base) MCG/ACT inhaler Inhale 1-2 puffs into the lungs every 6 (six) hours as needed for wheezing or shortness of breath. 1 each 1  ? aspirin-acetaminophen-caffeine (EXCEDRIN MIGRAINE) 250-250-65 MG tablet Take 2 tablets by mouth every 6 (six) hours as needed for headache or migraine.    ? Continuous Blood Gluc Receiver ([place]) DEVI See admin instructions.    ? Continuous Blood Gluc Sensor (DEXCOM G6 SENSOR) MISC CHANGE SENSOR EVERY 10 DAYS    ? Continuous Blood Gluc Transmit (DEXCOM G6 TRANSMITTER) MISC AS DIRECTED FOR 90 DAYS    ? insulin aspart (NOVOLOG) 100 UNIT/ML injection TAKE AFTER MEALS- 200-250 TAKE 4 UNITS, 251-300 TAKE 6 UNITS, 301-350 8 UNITS , 351-400 10 UNITS. (Patient taking differently: Inject into the skin 3 (three) times daily after meals. She uses a sliding scale.) 20 mL 2  ? Insulin Pen Needle 31G X 8 MM MISC As needed for insulin injections 100 each 1  ? LANTUS SOLOSTAR 100 UNIT/ML Solostar Pen 35 Units at bedtime.    ? DULoxetine (CYMBALTA) 30 MG capsule Take 3 capsules (90 mg total) by mouth daily. 90 capsule 0  ? hydrOXYzine (ATARAX) 10 MG tablet Take 1 tablet (  10 mg total) by mouth 3 (three) times daily as needed for anxiety. 30 tablet 0  ? traZODone (DESYREL) 50 MG tablet Take 1 tablet (50 mg total) by mouth at bedtime as needed for sleep. 30 tablet 0  ? ?PTA Medications: ?Prior to Admission medications   ?Medication Sig Start Date End Date Taking? Authorizing Provider  ?albuterol (VENTOLIN HFA) 108 (90 Base) MCG/ACT inhaler Inhale 1-2 puffs into the lungs every 6 (six) hours as needed for wheezing or shortness of breath. 09/15/21  Yes Marrian Salvage, FNP  ?aspirin-acetaminophen-caffeine [facility] MIGRAINE) [phone] MG tablet Take 2 tablets by mouth every 6 (six) hours as needed for headache or migraine.   Yes [provider]  ?Continuous Blood Gluc Receiver (DEXCOM G6 RECEIVER) [place] See admin instructions. 09/07/20   Yes [provider]  ?Continuous Blood Gluc Sensor (DEXCOM G6 SENSOR) MISC CHANGE SENSOR EVERY 10 DAYS   Yes [provider]  ?Continuous Blood Gluc Transmit (DEXCOM G6 TRANSMITTER) MISC AS DIRECTED FOR 90 DAYS 08/19/21  Yes [provider]  ?insulin aspart (NOVOLOG) 100 UNIT/ML injection TAKE AFTER MEALS- 200-250 TAKE 4 UNITS, 251-300 TAKE 6 UNITS, 301-350 8 UNITS , 351-400 10 UNITS. ?Patient taking differently: Inject into the skin 3 (three) times daily after meals. She uses a sliding scale. 01/27/20  Yes Lorin Glass, PA-C  ?Insulin Pen Needle 31G X 8 MM MISC As needed for insulin injections 05/17/15  Yes Jorje Guild, NP  ?LANTUS SOLOSTAR 100 UNIT/ML Solostar Pen 35 Units at bedtime. 09/08/19  Yes [provider]  ?DULoxetine (CYMBALTA) 30 MG capsule Take 3 capsules (90 mg total) by mouth daily. 11/15/21   Ival Bible, MD  ?hydrOXYzine (ATARAX) 10 MG tablet Take 1 tablet (10 mg total) by mouth 3 (three) times daily as needed for anxiety. 11/14/21   Ival Bible, MD  ?traZODone (DESYREL) 50 MG tablet Take 1 tablet (50 mg total) by mouth at bedtime as needed for sleep. 11/14/21   Ival Bible, MD  ? ? ?Patient Stressors: Marital or family conflict   ? ?Patient Strengths: Ability for insight  ?Capable of independent living  ?Communication skills  ?Motivation for treatment/growth  ? ?Treatment Modalities: Medication Management, Group therapy, Case management,  ?1 to 1 session with clinician, Psychoeducation, Recreational therapy. ? ? ?Physician Treatment Plan for Primary and Secondary Diagnosis:  ?Final diagnoses:  ?MDD (major depressive disorder), recurrent severe, without psychosis ([place])  ?Adjustment disorder with mixed anxiety and depressed mood  ? ?Long Term Goal(s): Improvement in symptoms so as ready for discharge ? ?Short Term Goals: Ability to identify changes in lifestyle to reduce recurrence of condition will improve ?Ability to verbalize  feelings will improve ?Ability to disclose and discuss suicidal ideas ?Ability to demonstrate self-control will improve ?Ability to identify and develop effective coping behaviors will improve ?Ability to maintain clinical measurements within normal limits will improve ?Compliance with prescribed medications will improve ?Ability to identify triggers associated with substance abuse/mental health issues will improve ? ?Medication Management: Evaluate patient's response, side effects, and tolerance of medication regimen. ? ?Therapeutic Interventions: 1 to 1 sessions, Unit Group sessions and Medication administration. ? ?Evaluation of Outcomes: Adequate for Discharge ? ?RN Treatment Plan for Primary Diagnosis:  ?Final diagnoses:  ?MDD (major depressive disorder), recurrent severe, without psychosis ([place])  ?Adjustment disorder with mixed anxiety and depressed mood  ? ? ?Long Term Goal(s): Knowledge of disease and therapeutic regimen to maintain health will improve ? ?Short Term Goals: Ability to verbalize  frustration and anger appropriately will improve, Ability to disclose and discuss suicidal ideas, and Ability to identify and develop effective coping behaviors will improve ? ?Medication Management: RN will administer medications as ordered by provider, will assess and evaluate patient's response and provide education to patient for prescribed medication. RN will report any adverse and/or side effects to prescribing provider. ? ?Therapeutic Interventions: 1 on 1 counseling sessions, Psychoeducation, Medication administration, Evaluate responses to treatment, Monitor vital signs and CBGs as ordered, Perform/monitor CIWA, COWS, AIMS and Fall Risk screenings as ordered, Perform wound care treatments as ordered. ? ?Evaluation of Outcomes: Adequate for Discharge ? ? ?LCSW Treatment Plan for Primary Diagnosis:  ?Final diagnoses:  ?MDD (major depressive disorder), recurrent severe, without psychosis ([place])  ?Adjustment  disorder with mixed anxiety and depressed mood  ? ? ?Long Term Goal(s): Safe transition to appropriate next level of care at discharge, Engage patient in therapeutic group addressing interpersonal concerns. ? ?Short

## 2021-11-15 NOTE — Discharge Instructions (Addendum)
Take all medications as prescribed by his/her mental healthcare provider. ?Report any adverse effects and or reactions from the medicines to your outpatient provider promptly. ?Do not engage in alcohol and or illegal drug use while on prescription medicines. ?In the event of worsening symptoms, call the crisis hotline, 911 and or go to the nearest ED for appropriate evaluation and treatment of symptoms. ?follow-up with your primary care provider for your other medical issues, concerns and or health care needs. ? ? ?Please come to [facility] (this facility) during walk in hours for appointment with psychiatrist/provider for further medication management and for therapists for therapy.  ? ? Walk-Ins for medication management  are available on Monday, Wednesday, Thursday and Friday from 8am-11am.  It is first come, first -serve; it is best to arrive by 7:00 AM.  ? ? Walk-Ins for therapy are  available on Monday and Wednesday?s  8am-11am.  It is first come, first -serve; it is best to arrive by 7:00 AM.  ? ? ?When you arrive please go upstairs for your appointment. If you are unsure of where to go, inform the front desk that you are here for a walk in appointment and they will assist you with directions upstairs. ? ?Address:  ?[address], in [place], [address] ?Ph: [phone]  ? ? ? ? ? ?

## 2021-11-23 ENCOUNTER — Encounter: Payer: Self-pay | Admitting: Family

## 2021-11-24 ENCOUNTER — Ambulatory Visit: Payer: Medicaid Other | Admitting: Family Medicine

## 2021-11-24 ENCOUNTER — Encounter: Payer: Self-pay | Admitting: Family

## 2021-11-24 ENCOUNTER — Ambulatory Visit: Payer: Medicaid Other | Admitting: Family

## 2021-11-24 VITALS — BP 110/70 | HR 87 | Temp 97.4°F | Ht 69.0 in | Wt 236.6 lb

## 2021-11-24 DIAGNOSIS — M542 Cervicalgia: Secondary | ICD-10-CM

## 2021-11-24 MED ORDER — MELOXICAM 15 MG PO TABS: 15.0000 mg | ORAL_TABLET | Freq: Every day | ORAL | 0 refills | Status: DC

## 2021-11-24 MED ORDER — METHOCARBAMOL 500 MG PO TABS: 500.0000 mg | ORAL_TABLET | Freq: Every evening | ORAL | 0 refills | Status: DC | PRN

## 2021-11-24 NOTE — Progress Notes (Signed)
Christie [name] is a 36 y.o. female with the following history as recorded in EpicCare:  Patient Active Problem List   Diagnosis Date Noted   MDD (major depressive disorder), recurrent severe, without psychosis ([place]) 11/12/2021   Mastalgia 11/06/2018   Bilateral lower extremity edema 08/24/2018   Poorly controlled diabetes mellitus ([employer]) 08/18/2018   Anxiety 08/12/2018   Hyperglycemia 08/12/2018   DKA (diabetic ketoacidoses) 07/11/2018   Alpha+ thalassemia trait 03/12/2018   History of loop electrical excision procedure (LEEP) 09/23/2017   Back pain 04/27/2016   Abnormal Papanicolaou smear of cervix with positive human papilloma virus (HPV) test 09/02/2015   Adjustment disorder with depressed mood 08/03/2015   Asthma, mild intermittent 07/06/2013   Type I diabetes mellitus with complication, uncontrolled ([place]) 06/18/2011   History of migraine during pregnancy 06/18/2011   Migraine 06/18/2011    Current Outpatient Medications  Medication Sig Dispense Refill   aspirin-acetaminophen-caffeine (EXCEDRIN MIGRAINE) 250-250-65 MG tablet Take 2 tablets by mouth every 6 (six) hours as needed for headache or migraine.     Continuous Blood Gluc Receiver ([place]) DEVI See admin instructions.     Continuous Blood Gluc Sensor (DEXCOM G6 SENSOR) MISC CHANGE SENSOR EVERY 10 DAYS     Continuous Blood Gluc Transmit (DEXCOM G6 TRANSMITTER) MISC AS DIRECTED FOR 90 DAYS     DULoxetine (CYMBALTA) 30 MG capsule Take 3 capsules (90 mg total) by mouth daily. 90 capsule 0   hydrOXYzine (ATARAX) 10 MG tablet Take 1 tablet (10 mg total) by mouth 3 (three) times daily as needed for anxiety. 30 tablet 0   insulin aspart (NOVOLOG) 100 UNIT/ML injection TAKE AFTER MEALS- 200-250 TAKE 4 UNITS, 251-300 TAKE 6 UNITS, 301-350 8 UNITS , 351-400 10 UNITS. (Patient taking differently: Inject into the skin 3 (three) times daily after meals. She uses a sliding scale.) 20 mL 2   Insulin Pen Needle 31G X 8 MM  MISC As needed for insulin injections 100 each 1   LANTUS SOLOSTAR 100 UNIT/ML Solostar Pen 35 Units at bedtime.     meloxicam (MOBIC) 15 MG tablet Take 1 tablet (15 mg total) by mouth daily. 20 tablet 0   methocarbamol (ROBAXIN) 500 MG tablet Take 1 tablet (500 mg total) by mouth at bedtime as needed for muscle spasms. 20 tablet 0   traZODone (DESYREL) 50 MG tablet Take 1 tablet (50 mg total) by mouth at bedtime as needed for sleep. 30 tablet 0   albuterol (VENTOLIN HFA) 108 (90 Base) MCG/ACT inhaler Inhale 1-2 puffs into the lungs every 6 (six) hours as needed for wheezing or shortness of breath. (Patient not taking: Reported on 11/24/2021) 1 each 1   No current facility-administered medications for this visit.    Allergies: Banana, Adhesive [tape], Citrus, and Tapentadol  Past Medical History:  Diagnosis Date   Abnormal Pap smear    colpo scheduled   Alpha thalassemia trait    Anemia    Anxiety    Asthma    Bipolar 1 disorder (HCC)    Chlamydia    Depression    Diabetes mellitus    type 1 on insulin   Fx ankle    history of left ankle fx at age 55   Headache(784.0)    Hx of migraines    Pregnancy induced hypertension    Urinary tract infection     Past Surgical History:  Procedure Laterality Date   CESAREAN SECTION Bilateral 08/21/2018   Procedure: CESAREAN SECTION WITH BILATERAL TUBAL  LIGATION;  Surgeon: Sloan Leiter, MD;  Location: [place];  Service: Obstetrics;  Laterality: Bilateral;   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   TUBAL LIGATION     WISDOM TOOTH EXTRACTION      Family History  Problem Relation Age of Onset   Asthma Mother    Diabetes Mother    Hypertension Mother    Asthma Father    Hypertension Sister    Diabetes Maternal Grandmother    Cancer Maternal Grandmother        breast   Obesity Other    Sleep apnea Other    Anesthesia problems Neg Hx     Social History   Tobacco Use   Smoking status: Never    Passive exposure:  Never   Smokeless tobacco: Never  Substance Use Topics   Alcohol use: Yes    Comment: occ    Subjective:  Left arm/ shoulder pain x 3 days; feels like numbness and tingling radiating into results; no known injury or trauma; has tried one Ibuprofen with limited benefit;      Objective:  Vitals:   11/24/21 1538  BP: 110/70  Pulse: 87  Temp: (!) 97.4 F (36.3 C)  TempSrc: Oral  SpO2: 90%  Weight: 236 lb 9.6 oz (107.3 kg)  Height: 5\' 9"  (1.753 m)    General: Well developed, well nourished, in no acute distress  Skin : Warm and dry.  Head: Normocephalic and atraumatic  Lungs: Respirations unlabored;  Musculoskeletal: No deformities; no active joint inflammation  Extremities: No edema, cyanosis, clubbing  Vessels: Symmetric bilaterally  Neurologic: Alert and oriented; speech intact; face symmetrical; moves all extremities well; CNII-XII intact without focal deficit   Assessment:  1. Neck pain on left side     Plan:  Rx for Mobic and Robaxin; apply heat and rest; follow up worse, no better.   No follow-ups on file.  No orders of the defined types were placed in this encounter.   Requested Prescriptions   Signed Prescriptions Disp Refills   meloxicam (MOBIC) 15 MG tablet 20 tablet 0    Sig: Take 1 tablet (15 mg total) by mouth daily.   methocarbamol (ROBAXIN) 500 MG tablet 20 tablet 0    Sig: Take 1 tablet (500 mg total) by mouth at bedtime as needed for muscle spasms.

## 2021-11-28 ENCOUNTER — Ambulatory Visit: Payer: Medicaid Other | Admitting: Family

## 2021-11-28 ENCOUNTER — Other Ambulatory Visit: Payer: Self-pay | Admitting: Family

## 2021-11-28 ENCOUNTER — Telehealth: Payer: Self-pay

## 2021-11-28 VITALS — BP 126/80 | HR 99 | Resp 18 | Ht 69.0 in | Wt 235.4 lb

## 2021-11-28 DIAGNOSIS — G43809 Other migraine, not intractable, without status migrainosus: Secondary | ICD-10-CM | POA: Diagnosis not present

## 2021-11-28 MED ORDER — KETOROLAC TROMETHAMINE 60 MG/2ML IM SOLN
60.0000 mg | Freq: Once | INTRAMUSCULAR | Status: AC
  Administered 2021-11-28: 60 mg via INTRAMUSCULAR

## 2021-11-28 MED ORDER — ONDANSETRON 8 MG PO TBDP: 8.0000 mg | ORAL_TABLET | Freq: Three times a day (TID) | ORAL | 0 refills | Status: DC | PRN

## 2021-11-28 NOTE — Progress Notes (Signed)
Christie [name] is a 36 y.o. female with the following history as recorded in EpicCare:  Patient Active Problem List   Diagnosis Date Noted   MDD (major depressive disorder), recurrent severe, without psychosis (HCC) 11/12/2021   Mastalgia 11/06/2018   Bilateral lower extremity edema 08/24/2018   Poorly controlled diabetes mellitus (HCC) 08/18/2018   Anxiety 08/12/2018   Hyperglycemia 08/12/2018   DKA (diabetic ketoacidoses) 07/11/2018   Alpha+ thalassemia trait 03/12/2018   History of loop electrical excision procedure (LEEP) 09/23/2017   Back pain 04/27/2016   Abnormal Papanicolaou smear of cervix with positive human papilloma virus (HPV) test 09/02/2015   Adjustment disorder with depressed mood 08/03/2015   Asthma, mild intermittent 07/06/2013   Type I diabetes mellitus with complication, uncontrolled (HCC) 06/18/2011   History of migraine during pregnancy 06/18/2011   Migraine 06/18/2011    Current Outpatient Medications  Medication Sig Dispense Refill   ondansetron (ZOFRAN-ODT) 8 MG disintegrating tablet Take 1 tablet (8 mg total) by mouth every 8 (eight) hours as needed for nausea or vomiting. 20 tablet 0   albuterol (VENTOLIN HFA) 108 (90 Base) MCG/ACT inhaler Inhale 1-2 puffs into the lungs every 6 (six) hours as needed for wheezing or shortness of breath. (Patient not taking: Reported on 11/24/2021) 1 each 1   aspirin-acetaminophen-caffeine (EXCEDRIN MIGRAINE) 250-250-65 MG tablet Take 2 tablets by mouth every 6 (six) hours as needed for headache or migraine.     Continuous Blood Gluc Receiver (DEXCOM G6 RECEIVER) DEVI See admin instructions.     Continuous Blood Gluc Sensor (DEXCOM G6 SENSOR) MISC CHANGE SENSOR EVERY 10 DAYS     Continuous Blood Gluc Transmit (DEXCOM G6 TRANSMITTER) MISC AS DIRECTED FOR 90 DAYS     DULoxetine (CYMBALTA) 30 MG capsule Take 3 capsules (90 mg total) by mouth daily. 90 capsule 0   hydrOXYzine (ATARAX) 10 MG tablet Take 1 tablet (10 mg total)  by mouth 3 (three) times daily as needed for anxiety. 30 tablet 0   insulin aspart (NOVOLOG) 100 UNIT/ML injection TAKE AFTER MEALS- 200-250 TAKE 4 UNITS, 251-300 TAKE 6 UNITS, 301-350 8 UNITS , 351-400 10 UNITS. (Patient taking differently: Inject into the skin 3 (three) times daily after meals. She uses a sliding scale.) 20 mL 2   Insulin Pen Needle 31G X 8 MM MISC As needed for insulin injections 100 each 1   LANTUS SOLOSTAR 100 UNIT/ML Solostar Pen 35 Units at bedtime.     methocarbamol (ROBAXIN) 500 MG tablet Take 1 tablet (500 mg total) by mouth at bedtime as needed for muscle spasms. 20 tablet 0   traZODone (DESYREL) 50 MG tablet Take 1 tablet (50 mg total) by mouth at bedtime as needed for sleep. 30 tablet 0   No current facility-administered medications for this visit.    Allergies: Banana, Adhesive [tape], Citrus, and Tapentadol  Past Medical History:  Diagnosis Date   Abnormal Pap smear    colpo scheduled   Alpha thalassemia trait    Anemia    Anxiety    Asthma    Bipolar 1 disorder (HCC)    Chlamydia    Depression    Diabetes mellitus    type 1 on insulin   Fx ankle    history of left ankle fx at age 68   Headache(784.0)    Hx of migraines    Pregnancy induced hypertension    Urinary tract infection     Past Surgical History:  Procedure Laterality Date   CESAREAN SECTION  Bilateral 08/21/2018   Procedure: CESAREAN SECTION WITH BILATERAL TUBAL LIGATION;  Surgeon: Conan Bowens, MD;  Location: [facility];  Service: Obstetrics;  Laterality: Bilateral;   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   TUBAL LIGATION     WISDOM TOOTH EXTRACTION      Family History  Problem Relation Age of Onset   Asthma Mother    Diabetes Mother    Hypertension Mother    Asthma Father    Hypertension Sister    Diabetes Maternal Grandmother    Cancer Maternal Grandmother        breast   Obesity Other    Sleep apnea Other    Anesthesia problems Neg Hx     Social  History   Tobacco Use   Smoking status: Never    Passive exposure: Never   Smokeless tobacco: Never  Substance Use Topics   Alcohol use: Yes    Comment: occ    Subjective:  Does have history of migraine headaches; notes that migraines typically last 24 hours and respond to Excedrin Migraine; current headache started on Sunday and "won't let up." Does check blood sugar in office- 330 and had eaten immediately prior;  Prefers to have the light off in the office; + nausea;      Objective:  Vitals:   11/28/21 1304  BP: 126/80  Pulse: 99  Resp: 18  SpO2: 97%  Weight: 235 lb 6.4 oz (106.8 kg)  Height: 5\' 9"  (1.753 m)    General: Well developed, well nourished, in no acute distress  Skin : Warm and dry.  Head: Normocephalic and atraumatic  Lungs: Respirations unlabored; clear to auscultation bilaterally without wheeze, rales, rhonchi  CVS exam: normal rate and regular rhythm.  Neurologic: Alert and oriented; speech intact; face symmetrical; moves all extremities well; CNII-XII intact without focal deficit   Assessment:  1. Other migraine without status migrainosus, not intractable     Plan:  Toradol IM 60 mg given in office today; patient understands to hold Mobic but can take Meloxicam that was given last week; apply heating pad/ rest; work note given for yesterday and today as requested; follow up worse, no better.   No follow-ups on file.  No orders of the defined types were placed in this encounter.   Requested Prescriptions   Signed Prescriptions Disp Refills   ondansetron (ZOFRAN-ODT) 8 MG disintegrating tablet 20 tablet 0    Sig: Take 1 tablet (8 mg total) by mouth every 8 (eight) hours as needed for nausea or vomiting.

## 2021-11-28 NOTE — Telephone Encounter (Signed)
Nurse Assessment Nurse: Lodema Hong, RN, De[place] Date/Time (Eastern Time): 11/27/2021 7:32:15 PM Confirm and document reason for call. If symptomatic, describe symptoms. ---Caller states having migraine x2 days, no vomiting just feels nausea, light sensitive, has to lay down, cannot sit up. [place] and back of head, rates pain a 7 out 0-10. no hx of migraines. under a lot of stress Does the patient have any new or worsening symptoms? ---Yes Will a triage be completed? ---Yes Related visit to physician within the last 2 weeks? ---No Does the PT have any chronic conditions? (i.e. diabetes, asthma, this includes High risk factors for pregnancy, etc.) ---Yes List chronic conditions. ---DM, Depression, Anxiety Is the patient pregnant or possibly pregnant? (Ask all females between the ages of 57-55) ---No Is this a behavioral health or substance abuse call? ---No Guidelines Guideline Title Affirmed Question Affirmed Notes Nurse Date/Time (Eastern Time) Headache [1] SEVERE headache (e.g., excruciating) AND [2] "worst headache" of life Altamese Dilling 11/27/2021 7:38:03 PM PLEASE NOTE: All timestamps contained within this report are represented as [place] Standard Time. CONFIDENTIALTY NOTICE: This fax transmission is intended only for the addressee. It contains information that is legally privileged, confidential or otherwise protected from use or disclosure. If you are not the intended recipient, you are strictly prohibited from reviewing, disclosing, copying using or disseminating any of this information or taking any action in reliance on or regarding this information. If you have received this fax in error, please notify us immediately by telephone so that we can arrange for its return to [place]. Phone: [phone], Toll-Free: [phone], Fax: [phone] Page: 2 of 2 Call Id: [id number] Disp. Time Lamount Cohen Time) Disposition Final User 11/27/2021 7:22:12 PM Send to Clinical Marita Kansas, RN,  Synetta Fail 11/27/2021 7:42:57 PM Go to ED Now (or PCP triage) Yes Lodema Hong, RN, Karie Mainland Disagree/Comply Comply Caller Understands Yes PreDisposition Did not know what to do Care Advice Given Per Guideline GO TO ED NOW (OR PCP TRIAGE): PAIN MEDICINES: * For pain relief, you can take either acetaminophen, ibuprofen, or naproxen. ANOTHER ADULT SHOULD DRIVE: * It is better and safer if another adult drives instead of you. CARE ADVICE given per Headache (Adult) guideline. Referrals Med [facility] - ED

## 2021-11-28 NOTE — Telephone Encounter (Signed)
Pt is scheduled to come in at 1 pm today.

## 2021-12-04 ENCOUNTER — Encounter (HOSPITAL_BASED_OUTPATIENT_CLINIC_OR_DEPARTMENT_OTHER): Payer: Self-pay

## 2021-12-04 ENCOUNTER — Emergency Department (HOSPITAL_BASED_OUTPATIENT_CLINIC_OR_DEPARTMENT_OTHER)
Admission: EM | Admit: 2021-12-04 | Discharge: 2021-12-04 | Disposition: A | Discharge status: Home / Self Care | Payer: Medicaid Other | Attending: Emergency Medicine | Admitting: Emergency Medicine

## 2021-12-04 ENCOUNTER — Other Ambulatory Visit: Payer: Self-pay

## 2021-12-04 ENCOUNTER — Telehealth (HOSPITAL_COMMUNITY): Payer: Self-pay | Admitting: Family

## 2021-12-04 DIAGNOSIS — N9489 Other specified conditions associated with female genital organs and menstrual cycle: Secondary | ICD-10-CM | POA: Diagnosis not present

## 2021-12-04 DIAGNOSIS — R519 Headache, unspecified: Secondary | ICD-10-CM | POA: Diagnosis present

## 2021-12-04 DIAGNOSIS — Z794 Long term (current) use of insulin: Secondary | ICD-10-CM | POA: Diagnosis not present

## 2021-12-04 DIAGNOSIS — G43809 Other migraine, not intractable, without status migrainosus: Secondary | ICD-10-CM | POA: Insufficient documentation

## 2021-12-04 LAB — HCG, SERUM, QUALITATIVE: Preg, Serum: NEGATIVE

## 2021-12-04 LAB — CBC WITH DIFFERENTIAL/PLATELET
Abs Immature Granulocytes: 0.02 10*3/uL (ref 0.00–0.07)
Basophils Absolute: 0 10*3/uL (ref 0.0–0.1)
Basophils Relative: 0 %
Eosinophils Absolute: 0 10*3/uL (ref 0.0–0.5)
Eosinophils Relative: 0 %
HCT: 34.1 % — ABNORMAL LOW (ref 36.0–46.0)
Hemoglobin: 10.3 g/dL — ABNORMAL LOW (ref 12.0–15.0)
Immature Granulocytes: 0 %
Lymphocytes Relative: 36 %
Lymphs Abs: 2.7 10*3/uL (ref 0.7–4.0)
MCH: 22.2 pg — ABNORMAL LOW (ref 26.0–34.0)
MCHC: 30.2 g/dL (ref 30.0–36.0)
MCV: 73.3 fL — ABNORMAL LOW (ref 80.0–100.0)
Monocytes Absolute: 0.5 10*3/uL (ref 0.1–1.0)
Monocytes Relative: 7 %
Neutro Abs: 4.2 10*3/uL (ref 1.7–7.7)
Neutrophils Relative %: 57 %
Platelets: 276 10*3/uL (ref 150–400)
RBC: 4.65 MIL/uL (ref 3.87–5.11)
RDW: 15.9 % — ABNORMAL HIGH (ref 11.5–15.5)
WBC: 7.5 10*3/uL (ref 4.0–10.5)
nRBC: 0 % (ref 0.0–0.2)

## 2021-12-04 LAB — COMPREHENSIVE METABOLIC PANEL
ALT: 12 U/L (ref 0–44)
AST: 17 U/L (ref 15–41)
Albumin: 3.7 g/dL (ref 3.5–5.0)
Alkaline Phosphatase: 59 U/L (ref 38–126)
Anion gap: 9 (ref 5–15)
BUN: 14 mg/dL (ref 6–20)
CO2: 24 mmol/L (ref 22–32)
Calcium: 9.2 mg/dL (ref 8.9–10.3)
Chloride: 100 mmol/L (ref 98–111)
Creatinine, Ser: 0.82 mg/dL (ref 0.44–1.00)
GFR, Estimated: >60 mL/min (ref >60)
Glucose, Bld: 323 mg/dL — ABNORMAL HIGH (ref 70–99)
Potassium: 3.7 mmol/L (ref 3.5–5.1)
Sodium: 133 mmol/L — ABNORMAL LOW (ref 135–145)
Total Bilirubin: 0.3 mg/dL (ref 0.3–1.2)
Total Protein: 7.3 g/dL (ref 6.5–8.1)

## 2021-12-04 MED ORDER — DIPHENHYDRAMINE HCL 50 MG/ML IJ SOLN
12.5000 mg | Freq: Once | INTRAMUSCULAR | Status: AC
  Administered 2021-12-04: 12.5 mg via INTRAVENOUS
  Filled 2021-12-04: qty 1

## 2021-12-04 MED ORDER — SODIUM CHLORIDE 0.9 % IV BOLUS
1000.0000 mL | Freq: Once | INTRAVENOUS | Status: AC
  Administered 2021-12-04: 1000 mL via INTRAVENOUS

## 2021-12-04 MED ORDER — MAGNESIUM SULFATE IN D5W 1-5 GM/100ML-% IV SOLN
1.0000 g | Freq: Once | INTRAVENOUS | Status: AC
  Administered 2021-12-04: 1 g via INTRAVENOUS
  Filled 2021-12-04: qty 100

## 2021-12-04 MED ORDER — METOCLOPRAMIDE HCL 5 MG/ML IJ SOLN
10.0000 mg | Freq: Once | INTRAMUSCULAR | Status: AC
  Administered 2021-12-04: 10 mg via INTRAVENOUS
  Filled 2021-12-04: qty 2

## 2021-12-04 MED ORDER — DEXAMETHASONE SODIUM PHOSPHATE 10 MG/ML IJ SOLN
6.0000 mg | Freq: Once | INTRAMUSCULAR | Status: AC
Start: 2021-12-04 — End: 2021-12-04
  Administered 2021-12-04: 6 mg via INTRAVENOUS
  Filled 2021-12-04: qty 1

## 2021-12-04 NOTE — ED Provider Notes (Signed)
[facility]   CSN: [id number] Arrival date & time: 12/04/21  1016     History  Chief Complaint  Patient presents with   Migraine    Christie [name] is a 36 y.o. female present emergency department with persistent headache.  She reports is been ongoing for several days, waxing and waning, pressure sensation that is mostly frontally located, associated with some photophobia and nausea.  She denies change or loss of vision.  She denies numbness or weakness of the arms or legs.  She denies any neck pain or stiffness.  She denies fevers at home.  She says she has been suffering from migraines since she was a child, but has had increasing frequency recently.  This migraine did respond initially to Excedrin but no longer is responding well to this medication.  She has not seen a neurologist for her headaches  HPI     Home Medications Prior to Admission medications   Medication Sig Start Date End Date Taking? Authorizing Provider  albuterol (VENTOLIN HFA) 108 (90 Base) MCG/ACT inhaler Inhale 1-2 puffs into the lungs every 6 (six) hours as needed for wheezing or shortness of breath. Patient not taking: Reported on 11/24/2021 09/15/21   Olive BassMurray, Laura Woodruff, FNP  aspirin-acetaminophen-caffeine (EXCEDRIN MIGRAINE) [phone] MG tablet Take 2 tablets by mouth every 6 (six) hours as needed for headache or migraine.    [provider]  Continuous Blood Gluc Receiver (DEXCOM G6 RECEIVER) DEVI See admin instructions. 09/07/20   [provider]  Continuous Blood Gluc Sensor (DEXCOM G6 SENSOR) MISC CHANGE SENSOR EVERY 10 DAYS    [provider]  Continuous Blood Gluc Transmit (DEXCOM G6 TRANSMITTER) MISC AS DIRECTED FOR 90 DAYS 08/19/21   [provider]  DULoxetine (CYMBALTA) 30 MG capsule Take 3 capsules (90 mg total) by mouth daily. 11/15/21   Estella HuskLaubach, Katherine S, MD  hydrOXYzine (ATARAX) 10 MG tablet Take 1 tablet (10 mg  total) by mouth 3 (three) times daily as needed for anxiety. 11/14/21   Estella HuskLaubach, Katherine S, MD  insulin aspart (NOVOLOG) 100 UNIT/ML injection TAKE AFTER MEALS- 200-250 TAKE 4 UNITS, 251-300 TAKE 6 UNITS, 301-350 8 UNITS , 351-400 10 UNITS. Patient taking differently: Inject into the skin 3 (three) times daily after meals. She uses a sliding scale. 01/27/20   Cristina GongHammond, Elizabeth W, PA-C  Insulin Pen Needle 31G X 8 MM MISC As needed for insulin injections 05/17/15   Judeth HornLawrence, Erin, NP  LANTUS SOLOSTAR 100 UNIT/ML Solostar Pen 35 Units at bedtime. 09/08/19   [provider]  methocarbamol (ROBAXIN) 500 MG tablet Take 1 tablet (500 mg total) by mouth at bedtime as needed for muscle spasms. 11/24/21   Olive BassMurray, Laura Woodruff, FNP  ondansetron (ZOFRAN-ODT) 8 MG disintegrating tablet Take 1 tablet (8 mg total) by mouth every 8 (eight) hours as needed for nausea or vomiting. 11/28/21   Olive BassMurray, Laura Woodruff, FNP  traZODone (DESYREL) 50 MG tablet Take 1 tablet (50 mg total) by mouth at bedtime as needed for sleep. 11/14/21   Estella HuskLaubach, Katherine S, MD      Allergies    Banana, Adhesive [tape], Citrus, and Tapentadol    Review of Systems   Review of Systems  Physical Exam Updated Vital Signs BP 130/81   Pulse 100   Temp 97.9 F (36.6 C) (Oral)   Resp 18   Ht 5\' 9"  (1.753 m)   Wt 106.6 kg   SpO2 98%   BMI 34.70 kg/m  Physical Exam Constitutional:      General: She is not in acute distress. HENT:     Head: Normocephalic and atraumatic.  Eyes:     Conjunctiva/sclera: Conjunctivae normal.     Pupils: Pupils are equal, round, and reactive to light.  Cardiovascular:     Rate and Rhythm: Normal rate and regular rhythm.  Pulmonary:     Effort: Pulmonary effort is normal. No respiratory distress.  Skin:    General: Skin is warm and dry.  Neurological:     General: No focal deficit present.     Mental Status: She is alert and oriented to person, place, and time. Mental status is at baseline.      Cranial Nerves: No cranial nerve deficit.     Sensory: No sensory deficit.  Psychiatric:        Mood and Affect: Mood normal.        Behavior: Behavior normal.    ED Results / Procedures / Treatments   Labs (all labs ordered are listed, but only abnormal results are displayed) Labs Reviewed  COMPREHENSIVE METABOLIC PANEL - Abnormal; Notable for the following components:      Result Value   Sodium 133 (*)    Glucose, Bld 323 (*)    All other components within normal limits  CBC WITH DIFFERENTIAL/PLATELET - Abnormal; Notable for the following components:   Hemoglobin 10.3 (*)    HCT 34.1 (*)    MCV 73.3 (*)    MCH 22.2 (*)    RDW 15.9 (*)    All other components within normal limits  HCG, SERUM, QUALITATIVE    EKG None  Radiology No results found.  Procedures Procedures    Medications Ordered in ED Medications  sodium chloride 0.9 % bolus 1,000 mL (0 mLs Intravenous Stopped 12/04/21 1411)  magnesium sulfate IVPB 1 g 100 mL (0 g Intravenous Stopped 12/04/21 1332)  diphenhydrAMINE (BENADRYL) injection 12.5 mg (12.5 mg Intravenous Given 12/04/21 1303)  metoCLOPramide (REGLAN) injection 10 mg (10 mg Intravenous Given 12/04/21 1301)  dexamethasone (DECADRON) injection 6 mg (6 mg Intravenous Given 12/04/21 1257)    ED Course/ Medical Decision Making/ A&P Clinical Course as of 12/04/21 1542  Mon Dec 04, 2021  1525 Patient is feeling significantly better, was wide-awake with the lights fully on, eatingStacks and drinking soda.  She says she feels good to go home.  We will place a referral to neurology given that she is having chronic ongoing headaches, she verbalized understanding [MT]    Clinical Course User Index [MT] Gared Gillie, Kermit Balo, MD                           Medical Decision Making Amount and/or Complexity of Data Reviewed Labs: ordered.  Risk Prescription drug management.   This patient presents to the Emergency Department with complaint of headache.   This involves an extensive number of treatment options, and is a complaint that carries with it a high risk of complications and morbidity.  The differential diagnosis for headache includes tension type headache vs occipital headache vs migraine vs sinusitis vs other  I ordered, reviewed, and interpreted labs, including BMP and CBC.  There were no immediate, life-threatening emergencies found in this labwork.   I ordered medication IV medication for headache and/or nausea   The patient had a CT scan of her brain in 2021 which per my review was normal anatomically -do not see an indication for repeat  imaging at this time.  This headache pattern and presentation is not consistent with a ruptured aneurysm or brain bleed, and I also have a low suspicion for meningitis.  I do not think she needs an emergent lumbar puncture at this time.  I did advise that if she is having persistent or worsening chronic headaches that she should talk to her doctor about an outpatient MRI of the brain, and can provide a referral to neurology as well.  After the interventions stated above, I reevaluated the patient and found that the patient remained clinically stable.  Based on the patient's clinical exam, vital signs, risk factors, and ED testing, I felt that the patient's overall risk of life-threatening emergency such as ICH, meningitis, intracranial mass or tumor was quite low.  I suspect this clinical presentation is most consistent with migraine headache, but explained to the patient that this evaluation was not a definitive diagnostic workup.  I discussed outpatient follow up with primary care provider, and provided specialist office number on the patient's discharge paper if a referral was deemed necessary.  I discussed return precautions with the patient. I felt the patient was clinically stable for discharge.         Final Clinical Impression(s) / ED Diagnoses Final diagnoses:  Other migraine without  status migrainosus, not intractable    Rx / DC Orders ED Discharge Orders          Ordered    Ambulatory referral to Neurology       Comments: An appointment is requested in approximately: 2 weeks Migraine headaches   12/04/21 1247              Terald Sleeper, MD 12/04/21 [phone]

## 2021-12-04 NOTE — BH Assessment (Signed)
Care Management - [facility] Follow Up Discharges  ° °Writer attempted to make contact with patient today and was unsuccessful.  Writer left a HIPPA compliant voice message.  ° °Per chart review, patient was provided with outpatient resources. ° °

## 2021-12-04 NOTE — ED Notes (Signed)
Patient verbalizes understanding of discharge instructions. Opportunity for questioning and answers were provided. Patient discharged from ED.  °

## 2021-12-04 NOTE — ED Notes (Signed)
Pt accompanied to restroom to ensure that she was steady; ambulated with no difficulty.

## 2021-12-04 NOTE — ED Triage Notes (Signed)
Pt. States she has had a Migraine for 2 days, States they are not regular. Pain 10/10 State pain is located on left side behind eyes. States she has nausea with migraine. Denies aurora with migraine. Denies no falls or injury to head.

## 2021-12-05 ENCOUNTER — Encounter: Payer: Self-pay | Admitting: Neurology

## 2021-12-11 DIAGNOSIS — K900 Celiac disease: Secondary | ICD-10-CM | POA: Diagnosis not present

## 2021-12-11 DIAGNOSIS — E1065 Type 1 diabetes mellitus with hyperglycemia: Secondary | ICD-10-CM | POA: Diagnosis not present

## 2021-12-11 DIAGNOSIS — Z794 Long term (current) use of insulin: Secondary | ICD-10-CM | POA: Diagnosis not present

## 2021-12-14 ENCOUNTER — Ambulatory Visit
Admission: EM | Admit: 2021-12-14 | Discharge: 2021-12-14 | Disposition: A | Discharge status: Home / Self Care | Payer: Medicaid Other | Attending: Physician Assistant | Admitting: Physician Assistant

## 2021-12-14 DIAGNOSIS — G43809 Other migraine, not intractable, without status migrainosus: Secondary | ICD-10-CM

## 2021-12-14 MED ORDER — DIPHENHYDRAMINE HCL 25 MG PO CAPS
25.0000 mg | ORAL_CAPSULE | Freq: Once | ORAL | Status: AC
  Administered 2021-12-14: 25 mg via ORAL

## 2021-12-14 MED ORDER — METOCLOPRAMIDE HCL 5 MG/ML IJ SOLN
10.0000 mg | Freq: Once | INTRAMUSCULAR | Status: AC
  Administered 2021-12-14: 10 mg via INTRAMUSCULAR

## 2021-12-14 MED ORDER — KETOROLAC TROMETHAMINE 30 MG/ML IJ SOLN
30.0000 mg | Freq: Once | INTRAMUSCULAR | Status: AC
  Administered 2021-12-14: 30 mg via INTRAMUSCULAR

## 2021-12-14 NOTE — ED Triage Notes (Signed)
C/O migraine that started yesterday. Pt states that she has an appointment with neurology in October. Reports that the pain is same as typical migraines. Pt is sensitive to light.

## 2021-12-14 NOTE — ED Provider Notes (Signed)
[facility]    CSN: [id number] Arrival date & time: 12/14/21  1638      History   Chief Complaint No chief complaint on file.   HPI Christie [name] is a 36 y.o. female.   Pt complains of a migraine headache.  Pt reports she has a history of migraines.  Pt reports good response to migraine cocktail.   The history is provided by the patient. No language interpreter was used.    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   Mastalgia 11/06/2018   Bilateral lower extremity edema 08/24/2018   Poorly controlled diabetes mellitus (HCC) 08/18/2018   Anxiety 08/12/2018   Hyperglycemia 08/12/2018   DKA (diabetic ketoacidoses) 07/11/2018   Alpha+ thalassemia trait 03/12/2018   History of loop electrical excision procedure (LEEP) 09/23/2017   Back pain 04/27/2016   Abnormal Papanicolaou smear of cervix with positive human papilloma virus (HPV) test 09/02/2015   Adjustment disorder with depressed mood 08/03/2015   Asthma, mild intermittent 07/06/2013   Type I diabetes mellitus with complication, uncontrolled (HCC) 06/18/2011   History of migraine during pregnancy 06/18/2011   Migraine 06/18/2011    Past Surgical History:  Procedure Laterality Date   CESAREAN SECTION Bilateral 08/21/2018   Procedure: CESAREAN SECTION WITH BILATERAL TUBAL LIGATION;  Surgeon: Conan Bowens, MD;  Location: [facility];  Service: Obstetrics;  Laterality: Bilateral;   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 Authorizing Provider  DULoxetine (CYMBALTA) 30 MG capsule Take 3 capsules (90 mg total) by mouth daily. 11/15/21  Yes Estella Husk, MD  methocarbamol (ROBAXIN) 500 MG tablet Take 1 tablet (500 mg total) by mouth at bedtime as needed for muscle spasms. 11/24/21  Yes Olive Bass, FNP  ondansetron (ZOFRAN-ODT) 8 MG disintegrating tablet Take 1 tablet (8 mg total) by mouth every 8 (eight) hours as needed for nausea or vomiting. 11/28/21  Yes Olive Bass, FNP  traZODone (DESYREL) 50 MG tablet Take 1 tablet (50 mg total) by mouth at bedtime as needed for sleep. 11/14/21  Yes Estella Husk, MD  albuterol (VENTOLIN HFA) 108 (90 Base) MCG/ACT inhaler Inhale 1-2 puffs into  the lungs every 6 (six) hours as needed for wheezing or shortness of breath. Patient not taking: Reported on 11/24/2021 09/15/21   Olive BassMurray, Laura Woodruff, FNP  aspirin-acetaminophen-caffeine (EXCEDRIN MIGRAINE) [phone] MG tablet Take 2 tablets by mouth every 6 (six) hours as needed for headache or migraine.    [provider]  Continuous Blood Gluc Receiver (DEXCOM G6 RECEIVER) DEVI See admin instructions. 09/07/20   [provider]  Continuous Blood Gluc Sensor (DEXCOM G6 SENSOR) MISC CHANGE SENSOR EVERY 10 DAYS    [provider]  Continuous Blood Gluc Transmit (DEXCOM G6 TRANSMITTER) MISC AS DIRECTED FOR 90 DAYS 08/19/21   [provider]  hydrOXYzine (ATARAX) 10 MG tablet Take 1 tablet (10 mg total) by mouth 3 (three) times daily as needed for anxiety. 11/14/21   Estella HuskLaubach, Katherine S, MD  insulin aspart (NOVOLOG) 100  UNIT/ML injection TAKE AFTER MEALS- 200-250 TAKE 4 UNITS, 251-300 TAKE 6 UNITS, 301-350 8 UNITS , 351-400 10 UNITS. Patient taking differently: Inject into the skin 3 (three) times daily after meals. She uses a sliding scale. 01/27/20   Cristina GongHammond, Elizabeth W, PA-C  Insulin Pen Needle 31G X 8 MM MISC As needed for insulin injections 05/17/15   Judeth HornLawrence, Erin, NP  LANTUS SOLOSTAR 100 UNIT/ML Solostar Pen 35 Units at bedtime. 09/08/19   [provider]    Family History Family History  Problem Relation Age of Onset   Asthma Mother    Diabetes Mother    Hypertension Mother    Asthma Father    Hypertension Sister    Diabetes Maternal Grandmother    Cancer Maternal Grandmother        breast   Obesity Other    Sleep apnea Other    Anesthesia problems Neg Hx     Social History Social History   Tobacco Use   Smoking status: Never    Passive exposure: Never   Smokeless tobacco: Never  Vaping Use   Vaping Use: Never used  Substance Use Topics   Alcohol use: Yes    Comment: occ   Drug use: No    Comment: last used 1 years ago     Allergies   Banana, Adhesive [tape], Citrus, and Tapentadol   Review of Systems Review of Systems  All other systems reviewed and are negative.    Physical Exam Triage Vital Signs ED Triage Vitals  Enc Vitals Group     BP 12/14/21 1650 108/72     Pulse Rate 12/14/21 1650 84     Resp 12/14/21 1650 18     Temp 12/14/21 1650 98.2 F (36.8 C)     Temp Source 12/14/21 1650 Oral     SpO2 12/14/21 1650 98 %     Weight --      Height --      Head Circumference --      Peak Flow --      Pain Score 12/14/21 1647 9     Pain Loc --      Pain Edu? --      Excl. in GC? --    No data found.  Updated Vital Signs BP 108/72 (BP Location: Right Arm)   Pulse 84   Temp 98.2 F (36.8 C) (Oral)   Resp 18   LMP 12/11/2021   SpO2 98%   Visual Acuity Right Eye Distance:   Left Eye Distance:   Bilateral Distance:    Right Eye Near:   Left  Eye Near:  Bilateral Near:     Physical Exam Vitals and nursing note reviewed.  Constitutional:      Appearance: She is well-developed.  HENT:     Head: Normocephalic.     Nose: Nose normal.     Mouth/Throat:     Mouth: Mucous membranes are moist.  Cardiovascular:     Rate and Rhythm: Normal rate.  Pulmonary:     Effort: Pulmonary effort is normal.  Abdominal:     General: There is no distension.  Musculoskeletal:        General: Normal range of motion.     Cervical back: Normal range of motion.  Skin:    General: Skin is warm.  Neurological:     General: No focal deficit present.     Mental Status: She is alert and oriented to person, place, and time.      UC Treatments / Results  Labs (all labs ordered are listed, but only abnormal results are displayed) Labs Reviewed - No data to display  EKG   Radiology No results found.  Procedures Procedures (including critical care time)  Medications Ordered in UC Medications  ketorolac (TORADOL) 30 MG/ML injection 30 mg (30 mg Intramuscular Given 12/14/21 1724)  metoCLOPramide (REGLAN) injection 10 mg (10 mg Intramuscular Given 12/14/21 1724)  diphenhydrAMINE (BENADRYL) capsule 25 mg (25 mg Oral Given 12/14/21 1719)    Initial Impression / Assessment and Plan / UC Course  I have reviewed the triage vital signs and the nursing notes.  Pertinent labs & imaging results that were available during my care of the patient were reviewed by me and considered in my medical decision making (see chart for details).     MDM:  Pt given toradol and reglan IM.  Pt given benadryl po  Final Clinical Impressions(s) / UC Diagnoses   Final diagnoses:  Other migraine without status migrainosus, not intractable   Discharge Instructions   None    ED Prescriptions   None    PDMP not reviewed this encounter. An After Visit Summary was printed and given to the patient.    Elson Areas, [place] 12/14/21 1750

## 2021-12-16 ENCOUNTER — Emergency Department (HOSPITAL_BASED_OUTPATIENT_CLINIC_OR_DEPARTMENT_OTHER)
Admission: EM | Admit: 2021-12-16 | Discharge: 2021-12-16 | Disposition: A | Discharge status: Home / Self Care | Payer: Medicaid Other | Attending: Emergency Medicine | Admitting: Emergency Medicine

## 2021-12-16 ENCOUNTER — Other Ambulatory Visit: Payer: Self-pay

## 2021-12-16 ENCOUNTER — Encounter (HOSPITAL_BASED_OUTPATIENT_CLINIC_OR_DEPARTMENT_OTHER): Payer: Self-pay

## 2021-12-16 DIAGNOSIS — G43909 Migraine, unspecified, not intractable, without status migrainosus: Secondary | ICD-10-CM | POA: Insufficient documentation

## 2021-12-16 DIAGNOSIS — Z794 Long term (current) use of insulin: Secondary | ICD-10-CM | POA: Diagnosis not present

## 2021-12-16 DIAGNOSIS — R519 Headache, unspecified: Secondary | ICD-10-CM | POA: Diagnosis present

## 2021-12-16 LAB — PREGNANCY, URINE: Preg Test, Ur: NEGATIVE

## 2021-12-16 MED ORDER — METOCLOPRAMIDE HCL 5 MG/ML IJ SOLN
10.0000 mg | Freq: Once | INTRAMUSCULAR | Status: AC
  Administered 2021-12-16: 10 mg via INTRAVENOUS
  Filled 2021-12-16: qty 2

## 2021-12-16 MED ORDER — ACETAMINOPHEN 500 MG PO TABS
1000.0000 mg | ORAL_TABLET | Freq: Once | ORAL | Status: AC
  Administered 2021-12-16: 1000 mg via ORAL
  Filled 2021-12-16: qty 2

## 2021-12-16 MED ORDER — SODIUM CHLORIDE 0.9 % IV BOLUS
1000.0000 mL | Freq: Once | INTRAVENOUS | Status: AC
  Administered 2021-12-16: 1000 mL via INTRAVENOUS

## 2021-12-16 MED ORDER — SODIUM CHLORIDE 0.9 % IV SOLN: INTRAVENOUS | Status: DC

## 2021-12-16 MED ORDER — DIPHENHYDRAMINE HCL 50 MG/ML IJ SOLN
12.5000 mg | Freq: Once | INTRAMUSCULAR | Status: AC
  Administered 2021-12-16: 12.5 mg via INTRAVENOUS
  Filled 2021-12-16: qty 1

## 2021-12-16 NOTE — ED Triage Notes (Signed)
Pt reports migraine and nausea x 2 days. Pt reports intermittent migraines for the past 2 months

## 2021-12-16 NOTE — ED Provider Notes (Signed)
[facility] EMERGENCY DEPT Provider Note   CSN: [id number] Arrival date & time: 12/16/21  7322     History  Chief Complaint  Patient presents with   Migraine   Nausea    Christie [name] is a 36 y.o. female.   Migraine    36 year old female with a history of migraine headaches who presents to the emergency department with a chief complaint of headache.  The patient states that she has had 2 days of symptoms of headache, described as left-sided, associated nausea, light sensitivity and sound sensitivity.  She denies any sudden onset or maximal onset of her headache.  She denies any fevers or chills.  She denies any significant visual changes.  She denies any temporal tenderness.  Symptoms are consistent with prior migraine headaches and she has been taking Excedrin without relief.  She was previously seen in the emergency department and referred to neurology but does not have an appointment until October.  Home Medications Prior to Admission medications   Medication Sig Start Date End Date Taking? Authorizing Provider  albuterol (VENTOLIN HFA) 108 (90 Base) MCG/ACT inhaler Inhale 1-2 puffs into the lungs every 6 (six) hours as needed for wheezing or shortness of breath. Patient not taking: Reported on 11/24/2021 09/15/21   Olive Bass, FNP  aspirin-acetaminophen-caffeine (EXCEDRIN MIGRAINE) [id number] MG tablet Take 2 tablets by mouth every 6 (six) hours as needed for headache or migraine.    [provider]  Continuous Blood Gluc Receiver (DEXCOM G6 RECEIVER) DEVI See admin instructions. 09/07/20   [provider]  Continuous Blood Gluc Sensor (DEXCOM G6 SENSOR) MISC CHANGE SENSOR EVERY 10 DAYS    [provider]  Continuous Blood Gluc Transmit (DEXCOM G6 TRANSMITTER) MISC AS DIRECTED FOR 90 DAYS 08/19/21   [provider]  DULoxetine (CYMBALTA) 30 MG capsule Take 3 capsules (90 mg total) by mouth daily. 11/15/21   Estella Husk, MD  hydrOXYzine (ATARAX) 10 MG tablet Take 1 tablet (10 mg total) by mouth 3 (three) times daily as needed for anxiety. 11/14/21   Estella Husk, MD  insulin aspart (NOVOLOG) 100 UNIT/ML injection TAKE AFTER MEALS- 200-250 TAKE 4 UNITS, 251-300 TAKE 6 UNITS, 301-350 8 UNITS , 351-400 10 UNITS. Patient taking differently: Inject into the skin 3 (three) times daily after meals. She uses a sliding scale. 01/27/20   Cristina Gong, PA-C  Insulin Pen Needle 31G X 8 MM MISC As needed for insulin injections 05/17/15   Judeth Horn, NP  LANTUS SOLOSTAR 100 UNIT/ML Solostar Pen 35 Units at bedtime. 09/08/19   [provider]  methocarbamol (ROBAXIN) 500 MG tablet Take 1 tablet (500 mg total) by mouth at bedtime as needed for muscle spasms. 11/24/21   Olive Bass, FNP  ondansetron (ZOFRAN-ODT) 8 MG disintegrating tablet Take 1 tablet (8 mg total) by mouth every 8 (eight) hours as needed for nausea or vomiting. 11/28/21   Olive Bass, FNP  traZODone (DESYREL) 50 MG tablet Take 1 tablet (50 mg total) by mouth at bedtime as needed for sleep. 11/14/21   Estella Husk, MD      Allergies    Banana, Adhesive [tape], Citrus, and Tapentadol    Review of Systems   Review of Systems  All other systems reviewed and are negative.   Physical Exam Updated Vital Signs BP 110/79   Pulse 79   Temp 98.7 F (37.1 C) (Oral)   Resp 18   LMP 12/11/2021  SpO2 99%  Physical Exam Vitals and nursing note reviewed.  Constitutional:      General: She is not in acute distress. HENT:     Head: Normocephalic and atraumatic.  Eyes:     Conjunctiva/sclera: Conjunctivae normal.     Pupils: Pupils are equal, round, and reactive to light.  Cardiovascular:     Rate and Rhythm: Normal rate and regular rhythm.  Pulmonary:     Effort: Pulmonary effort is normal. No respiratory distress.  Abdominal:     General: There is no distension.     Tenderness: There is no  guarding.  Musculoskeletal:        General: No deformity or signs of injury.     Cervical back: Neck supple.  Skin:    Findings: No lesion or rash.  Neurological:     General: No focal deficit present.     Mental Status: She is alert. Mental status is at baseline.     Cranial Nerves: No cranial nerve deficit.     Sensory: No sensory deficit.     Motor: No weakness.     Gait: Gait normal.     ED Results / Procedures / Treatments   Labs (all labs ordered are listed, but only abnormal results are displayed) Labs Reviewed  PREGNANCY, URINE    EKG None  Radiology No results found.  Procedures Procedures    Medications Ordered in ED Medications  sodium chloride 0.9 % bolus 1,000 mL (0 mLs Intravenous Stopped 12/16/21 1039)    And  0.9 %  sodium chloride infusion (0 mLs Intravenous Stopped 12/16/21 1038)  metoCLOPramide (REGLAN) injection 10 mg (10 mg Intravenous Given 12/16/21 0934)  diphenhydrAMINE (BENADRYL) injection 12.5 mg (12.5 mg Intravenous Given 12/16/21 0934)  acetaminophen (TYLENOL) tablet 1,000 mg (1,000 mg Oral Given 12/16/21 0940)    ED Course/ Medical Decision Making/ A&P                           Medical Decision Making Amount and/or Complexity of Data Reviewed Labs: ordered.  Risk OTC drugs. Prescription drug management.   36 year old female with a history of migraine headaches who presents to the emergency department with a chief complaint of headache.  The patient states that she has had 2 days of symptoms of headache, described as left-sided, associated nausea, light sensitivity and sound sensitivity.  She denies any sudden onset or maximal onset of her headache.  She denies any fevers or chills.  She denies any significant visual changes.  She denies any temporal tenderness.  Symptoms are consistent with prior migraine headaches and she has been taking Excedrin without relief.  She was previously seen in the emergency department and referred to  neurology but does not have an appointment until October.  She is awake, alert, GCS 15, HDS, and afebrile. Her exam is most notable for normal gait, fully intact extraocular motions with bilaterally reactive pupils, no focal neurologic deficits, no meningismus. There is no rash. The headache was not sudden onset or the worst headache of the patient's life. There is no visual deficit.  I am most concerned for migraine headache.  I do not think the patient has an aneurysm, intracranial bleed, mass lesion, meningitis, temporal arteritis, stroke, cluster headache, idiopathic intracranial hypertension, cavernous sinus thrombosis, carbon monoxide toxicity, herpes zoster, carotid or vertebral artery dissection, or acute angle close glaucoma.  On reassessment, the patient remained well appearing and was again able to ambulate without  difficulty and did not have any focal neurologic deficits.  I believe the patient is stable for discharge.  We participated in shared decision making regarding continued observation in the ED versus discharge home for continued recovery after receiving the below medications. She preferred to recover at home. I believe that this is safe and reasonable.  Plan for discharge home with PCP and neurology follow-up. I provided ED return precautions. The patient felt safe with this plan.  ED Medication Summary: Medications  sodium chloride 0.9 % bolus 1,000 mL (0 mLs Intravenous Stopped 12/16/21 1039)    And  0.9 %  sodium chloride infusion (0 mLs Intravenous Stopped 12/16/21 1038)  metoCLOPramide (REGLAN) injection 10 mg (10 mg Intravenous Given 12/16/21 0934)  diphenhydrAMINE (BENADRYL) injection 12.5 mg (12.5 mg Intravenous Given 12/16/21 0934)  acetaminophen (TYLENOL) tablet 1,000 mg (1,000 mg Oral Given 12/16/21 0940)     Final Clinical Impression(s) / ED Diagnoses Final diagnoses:  Migraine without status migrainosus, not intractable, unspecified migraine type    Rx / DC  Orders ED Discharge Orders     None         Ernie Avena, MD 12/16/21 1052

## 2021-12-16 NOTE — Discharge Instructions (Addendum)
Follow-up with your PCP and neurology to discuss chronic management of migraine headaches

## 2021-12-16 NOTE — ED Notes (Signed)
Discharge instructions and follow up care reviewed and explained, pt verbalized understanding and had no other questions at d/c. Pt caox4, ambulatory, and in no obvious distress on departure.

## 2021-12-25 ENCOUNTER — Other Ambulatory Visit: Payer: Self-pay | Admitting: Family

## 2021-12-25 ENCOUNTER — Other Ambulatory Visit: Payer: Self-pay | Admitting: Family Medicine

## 2021-12-25 DIAGNOSIS — R0602 Shortness of breath: Secondary | ICD-10-CM

## 2022-01-16 ENCOUNTER — Encounter: Payer: Self-pay | Admitting: Family

## 2022-02-07 NOTE — Progress Notes (Unsigned)
NEUROLOGY CONSULTATION NOTE  [name] MRN: [id number] DOB: Dec 12, 1985  Referring provider: Alvester Chou, MD (ED referral) Primary care provider: Olive Bass, FNP  Reason for consult:  migraines  Assessment/Plan:   1 Migraine without aura, without status migrainosus, intractable - improved with treatment of anxiety since starting hydroxyzine 2 Left sided cervical radiculopathy  Migraine prevention:  medication not indicated.  Continue management of anxiety Migraine rescue:  sumatriptan 100mg  and Zofran ODT 8mg  Limit use of pain relievers to no more than 2 days out of week to prevent risk of rebound or medication-overuse headache. Keep headache diary Refer to physical therapy regarding cervical radiculopathy Follow up 4 months.    Subjective:  Christie [name] is a 36 year old female with Bipolar 1 disorder, asthma, DM I, and alpha thaleassemia trait who presents for migraines.  History supplemented by ED notes.  Onset:  Since childhood.  This year has progressively become more frequent and intractable.   Location:  behind left eye Quality:  squeezing Intensity:  9/10 Aura:  absent Prodrome:  absent Associated symptoms:  Nausea, photophobia, phonophobia.  She denies associated vomiting, visual disturbance or weakness. She also has numbness and tingling from the left side of her neck, down the arm and to the wrist.   Duration:  2 days (longest lasted 3 days) Frequency:  Initially once a week, recently 1 to 2 times a month.  Started taking hydroxyzine for anxiety which may have helped.   Frequency of abortive medication: just as needed Triggers:  stress Relieving factors:  nothing Activity:  aggravates  She required 3 ED visits between 5/29 and 6/10 for treatment.  Past NSAIDS/analgesics:  Fioricet, ibuprofen, ASA Past abortive triptans:  none Past abortive ergotamine:  none Past muscle relaxants:  cyclobenzaprine Past anti-emetic:   metoclopramide, promethazine Past antihypertensive medications:  amlodipine, furosemide Past antidepressant medications:  sertraline, bupropion Past anticonvulsant medications:  none Past anti-CGRP:  none Past vitamins/Herbal/Supplements:  B12 Past antihistamines/decongestants:  cetirizine Other past therapies:  none  Current NSAIDS/analgesics:  Excedrin Migraine, meloxicam Current triptans:  none Current ergotamine:  none Current anti-emetic:  ondansetron ODT 8mg  Current muscle relaxants:  methocarbamol Current Antihypertensive medications:  none Current Antidepressant medications:  duloxetine 90mg  daily, trazodone 50mg  QHS PRN (for insomnia) Current Anticonvulsant medications:  none Current anti-CGRP:  none Other therapy:  none Hormone/birth control:  none Other medications:  hydroxyzine, insulin, Lantus   Caffeine:  occasional coffee.  Cola, Dr. 32, 6/29 Dew occasional Diet:  1 gallon water daily.  Skips meals Exercise:   Depression:  yes; Anxiety:  Yes.  Many stressors - financial, caring for family - improved since starting hydroxyzine Sleep hygiene:  poor - trouble staying asleep. Family history of headache:  mom, grandmother      PAST MEDICAL HISTORY: Past Medical History:  Diagnosis Date   Abnormal Pap smear    colpo scheduled   Alpha thalassemia trait    Anemia    Anxiety    Asthma    Bipolar 1 disorder (HCC)    Chlamydia    Depression    Diabetes mellitus    type 1 on insulin   Fx ankle    history of left ankle fx at age 55   Headache(784.0)    Hx of migraines    Pregnancy induced hypertension    Urinary tract infection     PAST SURGICAL HISTORY: Past Surgical History:  Procedure Laterality Date   CESAREAN SECTION Bilateral 08/21/2018   Procedure: CESAREAN  SECTION WITH BILATERAL TUBAL LIGATION;  Surgeon: Conan Bowens, MD;  Location: [facility];  Service: Obstetrics;  Laterality: Bilateral;   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     Continuous Blood Gluc Receiver (DEXCOM G6 RECEIVER) DEVI See admin instructions.     Continuous Blood Gluc Sensor (DEXCOM G6 SENSOR) MISC CHANGE SENSOR EVERY 10 DAYS     Continuous Blood Gluc Transmit (DEXCOM G6 TRANSMITTER) MISC AS DIRECTED FOR 90 DAYS     DULoxetine (CYMBALTA) 30 MG capsule Take 3 capsules (90 mg total) by mouth daily. 90 capsule 0   hydrOXYzine (ATARAX) 10 MG tablet Take 1 tablet (10 mg total) by mouth 3 (three) times daily as needed for anxiety. 30 tablet 0   insulin aspart (NOVOLOG) 100 UNIT/ML injection TAKE AFTER MEALS- 200-250 TAKE 4 UNITS, 251-300 TAKE 6 UNITS, 301-350 8 UNITS , 351-400 10 UNITS. (Patient taking differently: Inject into the skin 3 (three) times daily after meals. She uses a sliding scale.) 20 mL 2   Insulin Pen Needle 31G X 8 MM MISC As needed for insulin injections 100 each 1   LANTUS SOLOSTAR 100 UNIT/ML Solostar Pen 35 Units at bedtime.     meloxicam (MOBIC) 15 MG tablet TAKE 1 TABLET (15 MG TOTAL) BY MOUTH DAILY. 20 tablet 0   methocarbamol (ROBAXIN) 500 MG tablet Take 1 tablet (500 mg total) by mouth at bedtime as needed for muscle spasms. 20 tablet 0   ondansetron (ZOFRAN-ODT) 8 MG disintegrating tablet Take 1 tablet (8 mg total) by mouth every 8 (eight) hours as needed for nausea or vomiting. 20 tablet 0   traZODone (DESYREL) 50 MG tablet Take 1 tablet (50 mg total) by mouth at bedtime as needed for sleep. 30 tablet 0   No current facility-administered medications on file prior to visit.     ALLERGIES: Allergies  Allergen Reactions   Banana Anaphylaxis   Adhesive [Tape] Rash   Citrus Rash and Other (See Comments)    Fresh only - Gums bleed   Tapentadol Rash    FAMILY HISTORY: Family History  Problem Relation Age of Onset   Asthma Mother    Diabetes Mother    Hypertension Mother    Asthma Father    Hypertension Sister    Diabetes Maternal Grandmother    Cancer Maternal Grandmother        breast   Obesity Other    Sleep apnea Other    Anesthesia problems Neg Hx     Objective:  Blood pressure 127/81, pulse 98, height 5\' 9"  (1.753 m), weight 230 lb 9.6 oz (104.6 kg), SpO2 100 %. General: No acute distress.  Patient appears well-groomed.   Head:  Normocephalic/atraumatic Eyes:  fundi examined but not visualized Neck: supple, no paraspinal tenderness, full range of motion Back: No paraspinal tenderness Heart: regular rate and rhythm Lungs: Clear to auscultation bilaterally. Vascular: No carotid bruits. Neurological Exam: Mental status: alert and oriented to person, place, and time,  speech fluent and not dysarthric, language intact. Cranial nerves: CN I: not tested CN II: pupils equal, round and reactive to light, visual fields intact CN III, IV, VI:  full range of motion, no nystagmus, no ptosis CN V: facial sensation intact. CN VII: upper and lower face symmetric CN VIII: hearing intact CN IX, X: gag intact, uvula midline CN XI: sternocleidomastoid and trapezius muscles intact CN XII: tongue midline Bulk & Tone: normal, no fasciculations. Motor:  muscle strength 5/5 throughout Sensation:  Pinprick, temperature and vibratory sensation intact. Deep Tendon Reflexes:  2+ throughout,  toes downgoing.   Finger to nose testing:  Without dysmetria.   Heel to shin:  Without dysmetria.   Gait:  Normal station and stride.  Romberg negative.    Thank you for allowing me to take part in the care of this patient.  Shon Millet, DO  CC: Olive Bass, FNP

## 2022-02-08 ENCOUNTER — Ambulatory Visit: Payer: Medicaid Other | Admitting: Neurology

## 2022-02-08 ENCOUNTER — Encounter: Payer: Self-pay | Admitting: Neurology

## 2022-02-08 VITALS — BP 127/81 | HR 98 | Ht 69.0 in | Wt 230.6 lb

## 2022-02-08 DIAGNOSIS — M5412 Radiculopathy, cervical region: Secondary | ICD-10-CM | POA: Diagnosis not present

## 2022-02-08 DIAGNOSIS — G43019 Migraine without aura, intractable, without status migrainosus: Secondary | ICD-10-CM

## 2022-02-08 MED ORDER — SUMATRIPTAN SUCCINATE 100 MG PO TABS: 100.0000 mg | ORAL_TABLET | ORAL | 5 refills | Status: DC | PRN

## 2022-02-08 NOTE — Patient Instructions (Signed)
  Take sumatriptan 100mg  at earliest onset of headache.  May repeat dose once in 2 hours if needed.  Maximum 2 tablets in 24 hours. Ondansetron for nausea. Limit use of pain relievers to no more than 2 days out of the week.  These medications include acetaminophen, NSAIDs (ibuprofen/Advil/Motrin, naproxen/Aleve, triptans (Imitrex/sumatriptan), Excedrin, and narcotics.  This will help reduce risk of rebound headaches. Be aware of common food triggers:  - Caffeine:  coffee, black tea, cola, Mt. Dew  - Chocolate  - Dairy:  aged cheeses (brie, blue, cheddar, gouda, [place], provolone, [place], Swiss, etc), chocolate milk, buttermilk, sour cream, limit eggs and yogurt  - Nuts, peanut butter  - Alcohol  - Cereals/grains:  FRESH breads (fresh bagels, sourdough, doughnuts), yeast productions  - Processed/canned/aged/cured meats (pre-packaged deli meats, hotdogs)  - MSG/glutamate:  soy sauce, flavor enhancer, pickled/preserved/marinated foods  - Sweeteners:  aspartame (Equal, Nutrasweet).  Sugar and Splenda are okay  - Vegetables:  legumes (lima beans, lentils, snow peas, fava beans, pinto peans, peas, garbanzo beans), sauerkraut, onions, olives, pickles  - Fruit:  avocados, bananas, citrus fruit (orange, lemon, grapefruit), mango  - Other:  Frozen meals, macaroni and cheese Routine exercise Stay adequately hydrated (aim for 64 oz water daily) Keep headache diary Maintain proper stress management Maintain proper sleep hygiene Do not skip meals Consider supplements:  magnesium citrate 400mg  daily, riboflavin 400mg  daily, coenzyme Q10 100mg  three times daily.

## 2022-02-26 ENCOUNTER — Other Ambulatory Visit: Payer: Self-pay

## 2022-02-26 MED ORDER — DULOXETINE HCL 30 MG PO CPEP: 90.0000 mg | ORAL_CAPSULE | Freq: Every day | ORAL | 0 refills | Status: DC

## 2022-03-19 DIAGNOSIS — K900 Celiac disease: Secondary | ICD-10-CM | POA: Diagnosis not present

## 2022-03-19 DIAGNOSIS — Z794 Long term (current) use of insulin: Secondary | ICD-10-CM | POA: Diagnosis not present

## 2022-03-19 DIAGNOSIS — E669 Obesity, unspecified: Secondary | ICD-10-CM | POA: Diagnosis not present

## 2022-03-19 DIAGNOSIS — E1065 Type 1 diabetes mellitus with hyperglycemia: Secondary | ICD-10-CM | POA: Diagnosis not present

## 2022-03-28 ENCOUNTER — Emergency Department (HOSPITAL_BASED_OUTPATIENT_CLINIC_OR_DEPARTMENT_OTHER)
Admission: EM | Admit: 2022-03-28 | Discharge: 2022-03-28 | Discharge status: Against Medical Advice | Payer: Medicaid Other | Attending: Emergency Medicine | Admitting: Emergency Medicine

## 2022-03-28 ENCOUNTER — Other Ambulatory Visit: Payer: Self-pay

## 2022-03-28 DIAGNOSIS — Z5321 Procedure and treatment not carried out due to patient leaving prior to being seen by health care provider: Secondary | ICD-10-CM | POA: Diagnosis not present

## 2022-03-28 DIAGNOSIS — H579 Unspecified disorder of eye and adnexa: Secondary | ICD-10-CM | POA: Diagnosis not present

## 2022-03-28 NOTE — ED Triage Notes (Signed)
C/O irritation on right eye, + watery drainage; stated she was rubbing her eyes and maybe got some eyelash stuck up there

## 2022-03-29 DIAGNOSIS — E109 Type 1 diabetes mellitus without complications: Secondary | ICD-10-CM | POA: Diagnosis not present

## 2022-03-29 DIAGNOSIS — H40013 Open angle with borderline findings, low risk, bilateral: Secondary | ICD-10-CM | POA: Diagnosis not present

## 2022-03-29 DIAGNOSIS — S0501XA Injury of conjunctiva and corneal abrasion without foreign body, right eye, initial encounter: Secondary | ICD-10-CM | POA: Diagnosis not present

## 2022-03-30 DIAGNOSIS — S0501XD Injury of conjunctiva and corneal abrasion without foreign body, right eye, subsequent encounter: Secondary | ICD-10-CM | POA: Diagnosis not present

## 2022-04-02 DIAGNOSIS — S0501XD Injury of conjunctiva and corneal abrasion without foreign body, right eye, subsequent encounter: Secondary | ICD-10-CM | POA: Diagnosis not present

## 2022-04-03 ENCOUNTER — Encounter: Payer: Self-pay | Admitting: Family

## 2022-04-06 ENCOUNTER — Other Ambulatory Visit (HOSPITAL_COMMUNITY)
Admission: RE | Admit: 2022-04-06 | Discharge: 2022-04-06 | Disposition: A | Discharge status: Home / Self Care | Payer: Medicaid Other | Source: Ambulatory Visit | Attending: Family | Admitting: Family

## 2022-04-06 ENCOUNTER — Ambulatory Visit: Payer: Medicaid Other | Admitting: Family

## 2022-04-06 ENCOUNTER — Encounter: Payer: Self-pay | Admitting: Family

## 2022-04-06 ENCOUNTER — Other Ambulatory Visit: Payer: Self-pay | Admitting: Family

## 2022-04-06 VITALS — BP 130/78 | HR 67 | Temp 98.8°F | Ht 69.0 in | Wt 232.6 lb

## 2022-04-06 DIAGNOSIS — Z113 Encounter for screening for infections with a predominantly sexual mode of transmission: Secondary | ICD-10-CM

## 2022-04-06 NOTE — Progress Notes (Signed)
Christie [name] is a 36 y.o. female with the following history as recorded in [employer]:  Patient Active Problem List   Diagnosis Date Noted   MDD (major depressive disorder), recurrent severe, without psychosis ([place]) 11/12/2021   Mastalgia 11/06/2018   Bilateral lower extremity edema 08/24/2018   Poorly controlled diabetes mellitus ([place]) 08/18/2018   Anxiety 08/12/2018   Hyperglycemia 08/12/2018   DKA (diabetic ketoacidoses) 07/11/2018   Alpha+ thalassemia trait 03/12/2018   History of loop electrical excision procedure (LEEP) 09/23/2017   Back pain 04/27/2016   Abnormal Papanicolaou smear of cervix with positive human papilloma virus (HPV) test 09/02/2015   Adjustment disorder with depressed mood 08/03/2015   Asthma, mild intermittent 07/06/2013   Type I diabetes mellitus with complication, uncontrolled ([place]) 06/18/2011   History of migraine during pregnancy 06/18/2011   Migraine 06/18/2011    Current Outpatient Medications  Medication Sig Dispense Refill   albuterol (VENTOLIN HFA) 108 (90 Base) MCG/ACT inhaler INHALE 1-2 PUFFS BY MOUTH EVERY 6 HOURS AS NEEDED FOR WHEEZE OR SHORTNESS OF BREATH 18 each 1   aspirin-acetaminophen-caffeine (EXCEDRIN MIGRAINE) 250-250-65 MG tablet Take 2 tablets by mouth every 6 (six) hours as needed for headache or migraine.     Continuous Blood Gluc Receiver ([place]) DEVI See admin instructions.     Continuous Blood Gluc Sensor (DEXCOM G6 SENSOR) MISC CHANGE SENSOR EVERY 10 DAYS     Continuous Blood Gluc Transmit (DEXCOM G6 TRANSMITTER) MISC AS DIRECTED FOR 90 DAYS     DULoxetine (CYMBALTA) 30 MG capsule Take 3 capsules (90 mg total) by mouth daily. 270 capsule 0   hydrOXYzine (ATARAX) 10 MG tablet Take 1 tablet (10 mg total) by mouth 3 (three) times daily as needed for anxiety. 30 tablet 0   insulin aspart (NOVOLOG) 100 UNIT/ML injection TAKE AFTER MEALS- 200-250 TAKE 4 UNITS, 251-300 TAKE 6 UNITS, 301-350 8 UNITS , 351-400 10 UNITS.  (Patient taking differently: Inject into the skin 3 (three) times daily after meals. She uses a sliding scale.) 20 mL 2   Insulin Pen Needle 31G X 8 MM MISC As needed for insulin injections 100 each 1   LANTUS SOLOSTAR 100 UNIT/ML Solostar Pen 35 Units at bedtime.     meloxicam (MOBIC) 15 MG tablet TAKE 1 TABLET (15 MG TOTAL) BY MOUTH DAILY. 20 tablet 0   methocarbamol (ROBAXIN) 500 MG tablet Take 1 tablet (500 mg total) by mouth at bedtime as needed for muscle spasms. 20 tablet 0   ondansetron (ZOFRAN-ODT) 8 MG disintegrating tablet Take 1 tablet (8 mg total) by mouth every 8 (eight) hours as needed for nausea or vomiting. 20 tablet 0   SUMAtriptan (IMITREX) 100 MG tablet Take 1 tablet (100 mg total) by mouth as needed for migraine. May repeat in 2 hours if headache persists or recurs.  Maximum 2 tablets in 24 hours. 10 tablet 5   traZODone (DESYREL) 50 MG tablet Take 1 tablet (50 mg total) by mouth at bedtime as needed for sleep. 30 tablet 0   No current facility-administered medications for this visit.    Allergies: Banana, Adhesive [tape], Citrus, and Tapentadol  Past Medical History:  Diagnosis Date   Abnormal Pap smear    colpo scheduled   Alpha thalassemia trait    Anemia    Anxiety    Asthma    Bipolar 1 disorder (HCC)    Chlamydia    Depression    Diabetes mellitus    type 1 on insulin  Fx ankle    history of left ankle fx at age 69   Headache(784.0)    Hx of migraines    Pregnancy induced hypertension    Urinary tract infection     Past Surgical History:  Procedure Laterality Date   CESAREAN SECTION Bilateral 08/21/2018   Procedure: CESAREAN SECTION WITH BILATERAL TUBAL LIGATION;  Surgeon: Conan Bowens, MD;  Location: [facility];  Service: Obstetrics;  Laterality: Bilateral;   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   TUBAL LIGATION     WISDOM TOOTH EXTRACTION      Family History  Problem Relation Age of Onset   Asthma Mother    Diabetes Mother     Hypertension Mother    Asthma Father    Hypertension Sister    Diabetes Maternal Grandmother    Cancer Maternal Grandmother        breast   Obesity Other    Sleep apnea Other    Anesthesia problems Neg Hx     Social History   Tobacco Use   Smoking status: Never    Passive exposure: Never   Smokeless tobacco: Never  Substance Use Topics   Alcohol use: Yes    Comment: occ    Subjective:   Requesting STD screen today; no symptoms or exposure; prefers to get yearly STD screen to be safe;    Objective:  Vitals:   04/06/22 1500  BP: 130/78  Pulse: 67  Temp: 98.8 F (37.1 C)  TempSrc: Oral  SpO2: 98%  Weight: 232 lb 9.6 oz (105.5 kg)  Height: 5\' 9"  (1.753 m)    General: Well developed, well nourished, in no acute distress  Skin : Warm and dry.  Head: Normocephalic and atraumatic  Lungs: Respirations unlabored;  Neurologic: Alert and oriented; speech intact; face symmetrical; moves all extremities well; CNII-XII intact without focal deficit   Assessment:  1. Screen for STD (sexually transmitted disease)     Plan:  Will update panel as requested; follow up to be determined based on results.   She is continuing to work with her endocrinologist regularly for management of her Type 2 diabetes;   No follow-ups on file.  Orders Placed This Encounter  Procedures   HIV antibody (with reflex)   RPR    Requested Prescriptions    No prescriptions requested or ordered in this encounter

## 2022-04-09 ENCOUNTER — Other Ambulatory Visit: Payer: Self-pay | Admitting: Family

## 2022-04-09 LAB — CERVICOVAGINAL ANCILLARY ONLY
Bacterial Vaginitis (gardnerella): POSITIVE — AB
Candida Glabrata: NEGATIVE
Candida Vaginitis: NEGATIVE
Chlamydia: NEGATIVE
Comment: NEGATIVE
Comment: NEGATIVE
Comment: NEGATIVE
Comment: NEGATIVE
Comment: NEGATIVE
Comment: NORMAL
Neisseria Gonorrhea: NEGATIVE
Trichomonas: NEGATIVE

## 2022-04-09 LAB — HIV ANTIBODY (ROUTINE TESTING W REFLEX): HIV 1&2 Ab, 4th Generation: NONREACTIVE

## 2022-04-09 LAB — RPR: RPR Ser Ql: NONREACTIVE

## 2022-04-09 MED ORDER — METRONIDAZOLE 500 MG PO TABS: 500.0000 mg | ORAL_TABLET | Freq: Two times a day (BID) | ORAL | 0 refills | Status: DC

## 2022-04-14 ENCOUNTER — Encounter: Payer: Self-pay | Admitting: Family

## 2022-04-16 NOTE — Telephone Encounter (Signed)
Pt was last seen on 04/06/22 for sti screening. Please let me know if the pt needs to make another appointment to get prescreened.

## 2022-04-19 DIAGNOSIS — Z113 Encounter for screening for infections with a predominantly sexual mode of transmission: Secondary | ICD-10-CM | POA: Diagnosis not present

## 2022-04-26 ENCOUNTER — Encounter: Payer: Self-pay | Admitting: Family

## 2022-04-26 ENCOUNTER — Ambulatory Visit: Payer: Medicaid Other | Admitting: Family

## 2022-04-26 ENCOUNTER — Other Ambulatory Visit (HOSPITAL_COMMUNITY)
Admission: RE | Admit: 2022-04-26 | Discharge: 2022-04-26 | Disposition: A | Discharge status: Home / Self Care | Payer: Medicaid Other | Source: Ambulatory Visit | Attending: Family | Admitting: Family

## 2022-04-26 VITALS — BP 110/80 | HR 82 | Temp 98.1°F | Ht 69.0 in | Wt 233.4 lb

## 2022-04-26 DIAGNOSIS — N760 Acute vaginitis: Secondary | ICD-10-CM | POA: Diagnosis not present

## 2022-04-26 MED ORDER — METRONIDAZOLE 500 MG PO TABS: 500.0000 mg | ORAL_TABLET | Freq: Two times a day (BID) | ORAL | 0 refills | Status: DC

## 2022-04-26 NOTE — Progress Notes (Signed)
NEUROLOGY FOLLOW UP OFFICE NOTE  Christie [name] [id number]  Assessment/Plan:   Migraine without aura, without status migrainosus, intractable     Migraine prevention:  start topiramate 25mg  at bedtime.   Migraine rescue:  stop sumatriptan.  Start rizatriptan 10mg .  Zofran ODT 8mg  Limit use of pain relievers to no more than 2 days out of week to prevent risk of rebound or medication-overuse headache. Keep headache diary Follow up 4-5 months.       Subjective:  Christie [name] is a 36 year old female with Bipolar 1 disorder, asthma, DM I, and alpha thaleassemia trait who follows up for migraine.  UPDATE: Prescribed sumatriptan.   Intensity:  severe Duration:  4 days Frequency:  1 a month   Current NSAIDS/analgesics:  Excedrin Migraine, meloxicam Current triptans:  sumatriptan 100mg  Current ergotamine:  none Current anti-emetic:  ondansetron ODT 8mg  Current muscle relaxants:  methocarbamol Current Antihypertensive medications:  none Current Antidepressant medications:  duloxetine 90mg  daily, trazodone 50mg  QHS PRN (for insomnia) Current Anticonvulsant medications:  none Current anti-CGRP:  none Other therapy:  none Hormone/birth control:  none Other medications:  hydroxyzine, insulin, Lantus     Caffeine:  occasional coffee.  Cut down on soda to a couple of days a month. Diet:  1 gallon water daily.  Trying not to skip meals.   Exercise:   Depression:  yes; Anxiety:  Yes.  Many stressors - financial, caring for family - improved since starting hydroxyzine Sleep hygiene:  poor - trouble staying asleep.  HISTORY:  Onset:  Since childhood.  This year has progressively become more frequent and intractable.   Location:  behind left eye Quality:  squeezing Intensity:  9/10 Aura:  absent Prodrome:  absent Associated symptoms:  Nausea, photophobia, phonophobia.  She denies associated vomiting, visual disturbance or weakness. She also has numbness and tingling  from the left side of her neck, down the arm and to the wrist.   Duration:  2 days (longest lasted 3 days) Frequency:  Initially once a week, recently 1 to 2 times a month.  Started taking hydroxyzine for anxiety which may have helped.   Frequency of abortive medication: just as needed Triggers:  stress Relieving factors:  nothing Activity:  aggravates   She required 3 ED visits between 5/29 and 6/10 for treatment.   Past NSAIDS/analgesics:  Fioricet, ibuprofen, ASA Past abortive triptans:  none Past abortive ergotamine:  none Past muscle relaxants:  cyclobenzaprine Past anti-emetic:  metoclopramide, promethazine Past antihypertensive medications:  amlodipine, furosemide Past antidepressant medications:  sertraline, bupropion Past anticonvulsant medications:  none Past anti-CGRP:  none Past vitamins/Herbal/Supplements:  B12 Past antihistamines/decongestants:  cetirizine Other past therapies:  none    Family history of headache:  mom, grandmother  PAST MEDICAL HISTORY: Past Medical History:  Diagnosis Date   Abnormal Pap smear    colpo scheduled   Alpha thalassemia trait    Anemia    Anxiety    Asthma    Bipolar 1 disorder (HCC)    Chlamydia    Depression    Diabetes mellitus    type 1 on insulin   Fx ankle    history of left ankle fx at age 36   Headache(784.0)    Hx of migraines    Pregnancy induced hypertension    Urinary tract infection     MEDICATIONS: Current Outpatient Medications on File Prior to Visit  Medication Sig Dispense Refill   albuterol (VENTOLIN HFA) 108 (90 Base) MCG/ACT inhaler INHALE  1-2 PUFFS BY MOUTH EVERY 6 HOURS AS NEEDED FOR WHEEZE OR SHORTNESS OF BREATH 18 each 1   aspirin-acetaminophen-caffeine (EXCEDRIN MIGRAINE) 250-250-65 MG tablet Take 2 tablets by mouth every 6 (six) hours as needed for headache or migraine.     Continuous Blood Gluc Receiver ([place]) DEVI See admin instructions.     Continuous Blood Gluc Sensor (DEXCOM  G6 SENSOR) MISC CHANGE SENSOR EVERY 10 DAYS     Continuous Blood Gluc Transmit (DEXCOM G6 TRANSMITTER) MISC AS DIRECTED FOR 90 DAYS     DULoxetine (CYMBALTA) 30 MG capsule Take 3 capsules (90 mg total) by mouth daily. 270 capsule 0   hydrOXYzine (ATARAX) 10 MG tablet Take 1 tablet (10 mg total) by mouth 3 (three) times daily as needed for anxiety. 30 tablet 0   insulin aspart (NOVOLOG) 100 UNIT/ML injection TAKE AFTER MEALS- 200-250 TAKE 4 UNITS, 251-300 TAKE 6 UNITS, 301-350 8 UNITS , 351-400 10 UNITS. (Patient taking differently: Inject into the skin 3 (three) times daily after meals. She uses a sliding scale.) 20 mL 2   Insulin Pen Needle 31G X 8 MM MISC As needed for insulin injections 100 each 1   LANTUS SOLOSTAR 100 UNIT/ML Solostar Pen 35 Units at bedtime.     meloxicam (MOBIC) 15 MG tablet TAKE 1 TABLET (15 MG TOTAL) BY MOUTH DAILY. 20 tablet 0   methocarbamol (ROBAXIN) 500 MG tablet Take 1 tablet (500 mg total) by mouth at bedtime as needed for muscle spasms. 20 tablet 0   metroNIDAZOLE (FLAGYL) 500 MG tablet Take 1 tablet (500 mg total) by mouth 2 (two) times daily. 10 tablet 0   ondansetron (ZOFRAN-ODT) 8 MG disintegrating tablet Take 1 tablet (8 mg total) by mouth every 8 (eight) hours as needed for nausea or vomiting. 20 tablet 0   SUMAtriptan (IMITREX) 100 MG tablet Take 1 tablet (100 mg total) by mouth as needed for migraine. May repeat in 2 hours if headache persists or recurs.  Maximum 2 tablets in 24 hours. 10 tablet 5   traZODone (DESYREL) 50 MG tablet Take 1 tablet (50 mg total) by mouth at bedtime as needed for sleep. 30 tablet 0   No current facility-administered medications on file prior to visit.    ALLERGIES: Allergies  Allergen Reactions   Banana Anaphylaxis   Adhesive [Tape] Rash   Citrus Rash and Other (See Comments)    Fresh only - Gums bleed   Tapentadol Rash    FAMILY HISTORY: Family History  Problem Relation Age of Onset   Asthma Mother    Diabetes  Mother    Hypertension Mother    Asthma Father    Hypertension Sister    Diabetes Maternal Grandmother    Cancer Maternal Grandmother        breast   Obesity Other    Sleep apnea Other    Anesthesia problems Neg Hx       Objective:  Blood pressure 107/72, pulse 95, height 5\' 9"  (1.753 m), weight 232 lb (105.2 kg), last menstrual period 03/30/2022, SpO2 99 %. General: No acute distress.  Patient appears well-groomed.    Metta Clines, DO  CC: Marrian Salvage, [place]

## 2022-04-26 NOTE — Progress Notes (Signed)
Christie [name] is a 36 y.o. female with the following history as recorded in EpicCare:  Patient Active Problem List   Diagnosis Date Noted   MDD (major depressive disorder), recurrent severe, without psychosis (HCC) 11/12/2021   Mastalgia 11/06/2018   Bilateral lower extremity edema 08/24/2018   Poorly controlled diabetes mellitus (HCC) 08/18/2018   Anxiety 08/12/2018   Hyperglycemia 08/12/2018   DKA (diabetic ketoacidoses) 07/11/2018   Alpha+ thalassemia trait 03/12/2018   History of loop electrical excision procedure (LEEP) 09/23/2017   Back pain 04/27/2016   Abnormal Papanicolaou smear of cervix with positive human papilloma virus (HPV) test 09/02/2015   Adjustment disorder with depressed mood 08/03/2015   Asthma, mild intermittent 07/06/2013   Type I diabetes mellitus with complication, uncontrolled (HCC) 06/18/2011   History of migraine during pregnancy 06/18/2011   Migraine 06/18/2011    Current Outpatient Medications  Medication Sig Dispense Refill   albuterol (VENTOLIN HFA) 108 (90 Base) MCG/ACT inhaler INHALE 1-2 PUFFS BY MOUTH EVERY 6 HOURS AS NEEDED FOR WHEEZE OR SHORTNESS OF BREATH 18 each 1   aspirin-acetaminophen-caffeine (EXCEDRIN MIGRAINE) 250-250-65 MG tablet Take 2 tablets by mouth every 6 (six) hours as needed for headache or migraine.     Continuous Blood Gluc Receiver (DEXCOM G6 RECEIVER) DEVI See admin instructions.     Continuous Blood Gluc Sensor (DEXCOM G6 SENSOR) MISC CHANGE SENSOR EVERY 10 DAYS     Continuous Blood Gluc Transmit (DEXCOM G6 TRANSMITTER) MISC AS DIRECTED FOR 90 DAYS     DULoxetine (CYMBALTA) 30 MG capsule Take 3 capsules (90 mg total) by mouth daily. 270 capsule 0   hydrOXYzine (ATARAX) 10 MG tablet Take 1 tablet (10 mg total) by mouth 3 (three) times daily as needed for anxiety. 30 tablet 0   insulin aspart (NOVOLOG) 100 UNIT/ML injection TAKE AFTER MEALS- 200-250 TAKE 4 UNITS, 251-300 TAKE 6 UNITS, 301-350 8 UNITS , 351-400 10 UNITS.  (Patient taking differently: Inject into the skin 3 (three) times daily after meals. She uses a sliding scale.) 20 mL 2   Insulin Pen Needle 31G X 8 MM MISC As needed for insulin injections 100 each 1   LANTUS SOLOSTAR 100 UNIT/ML Solostar Pen 35 Units at bedtime.     meloxicam (MOBIC) 15 MG tablet TAKE 1 TABLET (15 MG TOTAL) BY MOUTH DAILY. 20 tablet 0   methocarbamol (ROBAXIN) 500 MG tablet Take 1 tablet (500 mg total) by mouth at bedtime as needed for muscle spasms. 20 tablet 0   metroNIDAZOLE (FLAGYL) 500 MG tablet Take 1 tablet (500 mg total) by mouth 2 (two) times daily. 10 tablet 0   ondansetron (ZOFRAN-ODT) 8 MG disintegrating tablet Take 1 tablet (8 mg total) by mouth every 8 (eight) hours as needed for nausea or vomiting. 20 tablet 0   traZODone (DESYREL) 50 MG tablet Take 1 tablet (50 mg total) by mouth at bedtime as needed for sleep. 30 tablet 0   SUMAtriptan (IMITREX) 100 MG tablet Take 1 tablet (100 mg total) by mouth as needed for migraine. May repeat in 2 hours if headache persists or recurs.  Maximum 2 tablets in 24 hours. (Patient not taking: Reported on 04/26/2022) 10 tablet 5   No current facility-administered medications for this visit.    Allergies: Banana, Adhesive [tape], Citrus, and Tapentadol  Past Medical History:  Diagnosis Date   Abnormal Pap smear    colpo scheduled   Alpha thalassemia trait    Anemia    Anxiety    Asthma  Bipolar 1 disorder (HCC)    Chlamydia    Depression    Diabetes mellitus    type 1 on insulin   Fx ankle    history of left ankle fx at age 5   Headache(784.0)    Hx of migraines    Pregnancy induced hypertension    Urinary tract infection     Past Surgical History:  Procedure Laterality Date   CESAREAN SECTION Bilateral 08/21/2018   Procedure: CESAREAN SECTION WITH BILATERAL TUBAL LIGATION;  Surgeon: Sloan Leiter, MD;  Location: [facility];  Service: Obstetrics;  Laterality: Bilateral;   COLPOSCOPY     LEEP   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   TUBAL LIGATION     WISDOM TOOTH EXTRACTION      Family History  Problem Relation Age of Onset   Asthma Mother    Diabetes Mother    Hypertension Mother    Asthma Father    Hypertension Sister    Diabetes Maternal Grandmother    Cancer Maternal Grandmother        breast   Obesity Other    Sleep apnea Other    Anesthesia problems Neg Hx     Social History   Tobacco Use   Smoking status: Never    Passive exposure: Never   Smokeless tobacco: Never  Substance Use Topics   Alcohol use: Yes    Comment: occ    Subjective:   Requesting repeat STD testing; has been having increased vaginal discharge- suspects BV; did have testing at [facility] on 10/12 and was negative for G/C and trich;    LMP 03/2222- BTL;   Objective:  Vitals:   04/26/22 1525  BP: 110/80  Pulse: 82  Temp: 98.1 F (36.7 C)  TempSrc: Oral  SpO2: 97%  Weight: 233 lb 6.4 oz (105.9 kg)  Height: 5\' 9"  (1.753 m)    General: Well developed, well nourished, in no acute distress  Skin : Warm and dry.  Head: Normocephalic and atraumatic  Lungs: Respirations unlabored;  Neurologic: Alert and oriented; speech intact; face symmetrical; moves all extremities well; CNII-XII intact without focal deficit  Pelvic exam deferred;   Assessment:  1. Acute vaginitis     Plan:  Will start treatment for BV; check vaginal swab today- follow up to be determined.   No follow-ups on file.  No orders of the defined types were placed in this encounter.   Requested Prescriptions   Signed Prescriptions Disp Refills   metroNIDAZOLE (FLAGYL) 500 MG tablet 10 tablet 0    Sig: Take 1 tablet (500 mg total) by mouth 2 (two) times daily.

## 2022-04-27 LAB — CERVICOVAGINAL ANCILLARY ONLY
Bacterial Vaginitis (gardnerella): NEGATIVE
Candida Glabrata: NEGATIVE
Candida Vaginitis: NEGATIVE
Chlamydia: NEGATIVE
Comment: NEGATIVE
Comment: NEGATIVE
Comment: NEGATIVE
Comment: NEGATIVE
Comment: NEGATIVE
Comment: NORMAL
Neisseria Gonorrhea: NEGATIVE
Trichomonas: NEGATIVE

## 2022-04-30 ENCOUNTER — Ambulatory Visit: Payer: Medicaid Other | Admitting: Neurology

## 2022-04-30 ENCOUNTER — Encounter: Payer: Self-pay | Admitting: Neurology

## 2022-04-30 ENCOUNTER — Ambulatory Visit (INDEPENDENT_AMBULATORY_CARE_PROVIDER_SITE_OTHER): Payer: Medicaid Other | Admitting: Neurology

## 2022-04-30 VITALS — BP 107/72 | HR 95 | Ht 69.0 in | Wt 232.0 lb

## 2022-04-30 DIAGNOSIS — G43019 Migraine without aura, intractable, without status migrainosus: Secondary | ICD-10-CM | POA: Diagnosis not present

## 2022-04-30 MED ORDER — RIZATRIPTAN BENZOATE 10 MG PO TABS: 10.0000 mg | ORAL_TABLET | ORAL | 5 refills | Status: DC | PRN

## 2022-04-30 MED ORDER — TOPIRAMATE 25 MG PO TABS: 25.0000 mg | ORAL_TABLET | Freq: Every day | ORAL | 5 refills | Status: DC

## 2022-06-14 ENCOUNTER — Ambulatory Visit (INDEPENDENT_AMBULATORY_CARE_PROVIDER_SITE_OTHER): Payer: Medicaid Other | Admitting: Family

## 2022-06-14 ENCOUNTER — Encounter: Payer: Self-pay | Admitting: Family

## 2022-06-14 VITALS — BP 116/74 | HR 105 | Temp 98.6°F | Resp 18 | Ht 69.0 in | Wt 222.2 lb

## 2022-06-14 DIAGNOSIS — G43809 Other migraine, not intractable, without status migrainosus: Secondary | ICD-10-CM | POA: Diagnosis not present

## 2022-06-14 DIAGNOSIS — K047 Periapical abscess without sinus: Secondary | ICD-10-CM

## 2022-06-14 MED ORDER — AMOXICILLIN-POT CLAVULANATE 875-125 MG PO TABS: 1.0000 | ORAL_TABLET | Freq: Two times a day (BID) | ORAL | 0 refills | Status: AC

## 2022-06-14 MED ORDER — KETOROLAC TROMETHAMINE 60 MG/2ML IM SOLN
60.0000 mg | Freq: Once | INTRAMUSCULAR | Status: AC
  Administered 2022-06-14: 60 mg via INTRAMUSCULAR

## 2022-06-14 NOTE — Progress Notes (Signed)
Christie [name] is a 36 y.o. female with the following history as recorded in EpicCare:  Patient Active Problem List   Diagnosis Date Noted   MDD (major depressive disorder), recurrent severe, without psychosis (HCC) 11/12/2021   Mastalgia 11/06/2018   Bilateral lower extremity edema 08/24/2018   Poorly controlled diabetes mellitus (HCC) 08/18/2018   Anxiety 08/12/2018   Hyperglycemia 08/12/2018   DKA (diabetic ketoacidoses) 07/11/2018   Alpha+ thalassemia trait 03/12/2018   History of loop electrical excision procedure (LEEP) 09/23/2017   Back pain 04/27/2016   Abnormal Papanicolaou smear of cervix with positive human papilloma virus (HPV) test 09/02/2015   Adjustment disorder with depressed mood 08/03/2015   Asthma, mild intermittent 07/06/2013   Type I diabetes mellitus with complication, uncontrolled (HCC) 06/18/2011   History of migraine during pregnancy 06/18/2011   Migraine 06/18/2011    Current Outpatient Medications  Medication Sig Dispense Refill   albuterol (VENTOLIN HFA) 108 (90 Base) MCG/ACT inhaler INHALE 1-2 PUFFS BY MOUTH EVERY 6 HOURS AS NEEDED FOR WHEEZE OR SHORTNESS OF BREATH 18 each 1   amoxicillin-clavulanate (AUGMENTIN) 875-125 MG tablet Take 1 tablet by mouth 2 (two) times daily for 10 days. 20 tablet 0   aspirin-acetaminophen-caffeine (EXCEDRIN MIGRAINE) 250-250-65 MG tablet Take 2 tablets by mouth every 6 (six) hours as needed for headache or migraine.     Continuous Blood Gluc Receiver (DEXCOM G6 RECEIVER) DEVI See admin instructions.     Continuous Blood Gluc Sensor (DEXCOM G6 SENSOR) MISC CHANGE SENSOR EVERY 10 DAYS     Continuous Blood Gluc Transmit (DEXCOM G6 TRANSMITTER) MISC AS DIRECTED FOR 90 DAYS     DULoxetine (CYMBALTA) 30 MG capsule Take 3 capsules (90 mg total) by mouth daily. 270 capsule 0   hydrOXYzine (ATARAX) 10 MG tablet Take 1 tablet (10 mg total) by mouth 3 (three) times daily as needed for anxiety. 30 tablet 0   insulin aspart  (NOVOLOG) 100 UNIT/ML injection TAKE AFTER MEALS- 200-250 TAKE 4 UNITS, 251-300 TAKE 6 UNITS, 301-350 8 UNITS , 351-400 10 UNITS. (Patient taking differently: Inject into the skin 3 (three) times daily after meals. She uses a sliding scale.) 20 mL 2   Insulin Disposable Pump (OMNIPOD 5 G6 POD, GEN 5,) MISC SMARTSIG:SUB-Q Every 3 Days     Insulin Pen Needle 31G X 8 MM MISC As needed for insulin injections 100 each 1   meloxicam (MOBIC) 15 MG tablet TAKE 1 TABLET (15 MG TOTAL) BY MOUTH DAILY. 20 tablet 0   methocarbamol (ROBAXIN) 500 MG tablet Take 1 tablet (500 mg total) by mouth at bedtime as needed for muscle spasms. 20 tablet 0   ondansetron (ZOFRAN-ODT) 8 MG disintegrating tablet Take 1 tablet (8 mg total) by mouth every 8 (eight) hours as needed for nausea or vomiting. 20 tablet 0   rizatriptan (MAXALT) 10 MG tablet Take 1 tablet (10 mg total) by mouth as needed for migraine. May repeat in 2 hours if needed.  Maximum 2 tablets in 24 hours. 10 tablet 5   topiramate (TOPAMAX) 25 MG tablet Take 1 tablet (25 mg total) by mouth at bedtime. 30 tablet 5   traZODone (DESYREL) 50 MG tablet Take 1 tablet (50 mg total) by mouth at bedtime as needed for sleep. 30 tablet 0   No current facility-administered medications for this visit.    Allergies: Banana, Adhesive [tape], Citrus, and Tapentadol  Past Medical History:  Diagnosis Date   Abnormal Pap smear    colpo scheduled  Alpha thalassemia trait    Anemia    Anxiety    Asthma    Bipolar 1 disorder (HCC)    Chlamydia    Depression    Diabetes mellitus    type 1 on insulin   Fx ankle    history of left ankle fx at age 34   Headache(784.0)    Hx of migraines    Pregnancy induced hypertension    Urinary tract infection     Past Surgical History:  Procedure Laterality Date   CESAREAN SECTION Bilateral 08/21/2018   Procedure: CESAREAN SECTION WITH BILATERAL TUBAL LIGATION;  Surgeon: Conan Bowens, MD;  Location: [facility];  Service:  Obstetrics;  Laterality: Bilateral;   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   TUBAL LIGATION     WISDOM TOOTH EXTRACTION      Family History  Problem Relation Age of Onset   Asthma Mother    Diabetes Mother    Hypertension Mother    Asthma Father    Hypertension Sister    Diabetes Maternal Grandmother    Cancer Maternal Grandmother        breast   Obesity Other    Sleep apnea Other    Anesthesia problems Neg Hx     Social History   Tobacco Use   Smoking status: Never    Passive exposure: Never   Smokeless tobacco: Never  Substance Use Topics   Alcohol use: Yes    Comment: occ    Subjective:   Chronic migraine sufferer; current headache x 4 days; normal medication is not working; notes symptoms are localized on left side of face and she has been struggling with broken tooth in left upper mouth for weeks; wonders if dental issues could be contributing to making the migraine worse; waiting for dental insurance to start in early January;   Objective:  Vitals:   06/14/22 1031  BP: 116/74  Pulse: (!) 105  Resp: 18  Temp: 98.6 F (37 C)  TempSrc: Oral  SpO2: 99%  Weight: 222 lb 3.2 oz (100.8 kg)  Height: 5\' 9"  (1.753 m)    General: Well developed, well nourished, in no acute distress  Skin : Warm and dry.  Head: Normocephalic and atraumatic  Eyes: Sclera and conjunctiva clear; pupils round and reactive to light; extraocular movements intact  Ears: External normal; canals clear; tympanic membranes normal  Oropharynx: Pink, supple. No suspicious lesions; poor dentition noted on left upper mouth Neck: Supple without thyromegaly, adenopathy  Lungs: Respirations unlabored;  Neurologic: Alert and oriented; speech intact; face symmetrical; moves all extremities well; CNII-XII intact without focal deficit   Assessment:  1. Other migraine without status migrainosus, not intractable   2. Dental infection     Plan:  Agree that underlying dental issues could be  contributing to making this migraine more problematic; will give Toradol IM today; Rx for Augmentin to help with suspected dental infection; stressed need for her to try to go ahead and see dentist if possible; follow up as needed.  No follow-ups on file.  No orders of the defined types were placed in this encounter.   Requested Prescriptions   Signed Prescriptions Disp Refills   amoxicillin-clavulanate (AUGMENTIN) 875-125 MG tablet 20 tablet 0    Sig: Take 1 tablet by mouth 2 (two) times daily for 10 days.

## 2022-06-15 ENCOUNTER — Ambulatory Visit: Payer: Medicaid Other | Admitting: Neurology

## 2022-07-16 ENCOUNTER — Encounter: Payer: Self-pay | Admitting: Family

## 2022-07-19 ENCOUNTER — Telehealth: Payer: Medicaid Other | Admitting: Emergency Medicine

## 2022-07-19 DIAGNOSIS — K0889 Other specified disorders of teeth and supporting structures: Secondary | ICD-10-CM | POA: Diagnosis not present

## 2022-07-19 MED ORDER — PENICILLIN V POTASSIUM 500 MG PO TABS: 500.0000 mg | ORAL_TABLET | Freq: Three times a day (TID) | ORAL | 0 refills | Status: AC

## 2022-07-19 MED ORDER — IBUPROFEN 600 MG PO TABS: 600.0000 mg | ORAL_TABLET | Freq: Three times a day (TID) | ORAL | 0 refills | Status: AC | PRN

## 2022-07-19 NOTE — Progress Notes (Signed)
E-Visit for Dental Pain  We are sorry that you are not feeling well.  Here is how we plan to help!  Based on what you have shared with me in the questionnaire, it sounds like you have dental pain and possibly a dental infection  Pen VK 500mg  3 times a day for 7 days and Ibuprofen 600mg  3 times a day for 7 days for discomfort  It is imperative that you see a dentist within 10 days of this eVisit to determine the cause of the dental pain and be sure it is adequately treated. Keep your appointment with the dentist - maybe try calling to see if you can be seen sooner.   A toothache or tooth pain is caused when the nerve in the root of a tooth or surrounding a tooth is irritated. Dental (tooth) infection, decay, injury, or loss of a tooth are the most common causes of dental pain. Pain may also occur after an extraction (tooth is pulled out). Pain sometimes originates from other areas and radiates to the jaw, thus appearing to be tooth pain.Bacteria growing inside your mouth can contribute to gum disease and dental decay, both of which can cause pain. A toothache occurs from inflammation of the central portion of the tooth called pulp. The pulp contains nerve endings that are very sensitive to pain. Inflammation to the pulp or pulpitis may be caused by dental cavities, trauma, and infection.    HOME CARE:   For toothaches: Over-the-counter pain medications such as acetaminophen or ibuprofen may be used. Take these as directed on the package while you arrange for a dental appointment. Avoid very cold or hot foods, because they may make the pain worse. You may get relief from biting on a cotton ball soaked in oil of cloves. You can get oil of cloves at most drug stores.  For jaw pain:  Aspirin may be helpful for problems in the joint of the jaw in adults. If pain happens every time you open your mouth widely, the temporomandibular joint (TMJ) may be the source of the pain. Yawning or taking a large  bite of food may worsen the pain. An appointment with your doctor or dentist will help you find the cause.     GET HELP RIGHT AWAY IF:  You have a high fever or chills If you have had a recent head or face injury and develop headache, light headedness, nausea, vomiting, or other symptoms that concern you after an injury to your face or mouth, you could have a more serious injury in addition to your dental injury. A facial rash associated with a toothache: This condition may improve with medication. Contact your doctor for them to decide what is appropriate. Any jaw pain occurring with chest pain: Although jaw pain is most commonly caused by dental disease, it is sometimes referred pain from other areas. People with heart disease, especially people who have had stents placed, people with diabetes, or those who have had heart surgery may have jaw pain as a symptom of heart attack or angina. If your jaw or tooth pain is associated with lightheadedness, sweating, or shortness of breath, you should see a doctor as soon as possible. Trouble swallowing or excessive pain or bleeding from gums: If you have a history of a weakened immune system, diabetes, or steroid use, you may be more susceptible to infections. Infections can often be more severe and extensive or caused by unusual organisms. Dental and gum infections in people with these  conditions may require more aggressive treatment. An abscess may need draining or IV antibiotics, for example.  MAKE SURE YOU   Understand these instructions. Will watch your condition. Will get help right away if you are not doing well or get worse.  Thank you for choosing an e-visit.  Your e-visit answers were reviewed by a board certified advanced clinical practitioner to complete your personal care plan. Depending upon the condition, your plan could have included both over the counter or prescription medications.  Please review your pharmacy choice. Make sure the  pharmacy is open so you can pick up prescription now. If there is a problem, you may contact your provider through [employer] and have the prescription routed to another pharmacy.  Your safety is important to [place]. If you have drug allergies check your prescription carefully.   For the next 24 hours you can use MyChart to ask questions about today's visit, request a non-urgent call back, or ask for a work or school excuse. You will get an email in the next two days asking about your experience. I hope that your e-visit has been valuable and will speed your recovery.  I have spent 5 minutes in review of e-visit questionnaire, review and updating patient chart, medical decision making and response to patient.   Willeen Cass, PhD, FNP-BC

## 2022-07-27 ENCOUNTER — Other Ambulatory Visit: Payer: Self-pay | Admitting: Family

## 2022-07-27 ENCOUNTER — Other Ambulatory Visit (INDEPENDENT_AMBULATORY_CARE_PROVIDER_SITE_OTHER): Payer: Medicaid Other

## 2022-07-27 DIAGNOSIS — E1165 Type 2 diabetes mellitus with hyperglycemia: Secondary | ICD-10-CM

## 2022-07-27 NOTE — Addendum Note (Signed)
Addended by: Manuela Schwartz on: 07/27/2022 03:18 PM   Modules accepted: Orders

## 2022-07-28 LAB — HEMOGLOBIN A1C
Hgb A1c MFr Bld: 12.4 % of total Hgb — ABNORMAL HIGH (ref <5.7)
Mean Plasma Glucose: 309 mg/dL
eAG (mmol/L): 17.1 mmol/L

## 2022-07-31 ENCOUNTER — Telehealth: Payer: Self-pay | Admitting: Family

## 2022-07-31 NOTE — Telephone Encounter (Signed)
Pt called stating that she had dropped off a form last Friday (1.19.24) to be filled out for dental work and wanted to check on the status of it. Advised Laura's CMA was out to lunch and a note would be sent back to look into this when she returns. Pt acknowledged understanding.

## 2022-07-31 NOTE — Telephone Encounter (Signed)
Forms faxed today.  Pt notified.

## 2022-08-09 ENCOUNTER — Telehealth: Payer: Medicaid Other | Admitting: Physician Assistant

## 2022-08-09 DIAGNOSIS — B3731 Acute candidiasis of vulva and vagina: Secondary | ICD-10-CM | POA: Diagnosis not present

## 2022-08-09 MED ORDER — FLUCONAZOLE 150 MG PO TABS: 150.0000 mg | ORAL_TABLET | Freq: Once | ORAL | 0 refills | Status: AC

## 2022-08-09 NOTE — Progress Notes (Signed)
I have spent 5 minutes in review of e-visit questionnaire, review and updating patient chart, medical decision making and response to patient.   Perl Kerney Cody Savahna Casados, PA-C    

## 2022-08-09 NOTE — Progress Notes (Signed)

## 2022-08-10 DIAGNOSIS — K900 Celiac disease: Secondary | ICD-10-CM | POA: Diagnosis not present

## 2022-08-10 DIAGNOSIS — Z794 Long term (current) use of insulin: Secondary | ICD-10-CM | POA: Diagnosis not present

## 2022-08-10 DIAGNOSIS — E669 Obesity, unspecified: Secondary | ICD-10-CM | POA: Diagnosis not present

## 2022-08-10 DIAGNOSIS — E1065 Type 1 diabetes mellitus with hyperglycemia: Secondary | ICD-10-CM | POA: Diagnosis not present

## 2022-08-13 ENCOUNTER — Other Ambulatory Visit: Payer: Self-pay | Admitting: Family

## 2022-08-17 ENCOUNTER — Encounter: Payer: Self-pay | Admitting: Family Medicine

## 2022-08-21 ENCOUNTER — Other Ambulatory Visit: Payer: Self-pay

## 2022-09-04 ENCOUNTER — Other Ambulatory Visit: Payer: Self-pay

## 2022-09-04 ENCOUNTER — Emergency Department (HOSPITAL_BASED_OUTPATIENT_CLINIC_OR_DEPARTMENT_OTHER)
Admission: EM | Admit: 2022-09-04 | Discharge: 2022-09-04 | Disposition: A | Discharge status: Home / Self Care | Payer: Medicaid Other | Attending: Emergency Medicine | Admitting: Emergency Medicine

## 2022-09-04 ENCOUNTER — Emergency Department (HOSPITAL_BASED_OUTPATIENT_CLINIC_OR_DEPARTMENT_OTHER): Payer: Medicaid Other

## 2022-09-04 ENCOUNTER — Encounter: Payer: Self-pay | Admitting: Family

## 2022-09-04 ENCOUNTER — Encounter (HOSPITAL_BASED_OUTPATIENT_CLINIC_OR_DEPARTMENT_OTHER): Payer: Self-pay | Admitting: Emergency Medicine

## 2022-09-04 DIAGNOSIS — R1084 Generalized abdominal pain: Secondary | ICD-10-CM

## 2022-09-04 DIAGNOSIS — Z794 Long term (current) use of insulin: Secondary | ICD-10-CM | POA: Diagnosis not present

## 2022-09-04 DIAGNOSIS — E109 Type 1 diabetes mellitus without complications: Secondary | ICD-10-CM | POA: Diagnosis not present

## 2022-09-04 DIAGNOSIS — R109 Unspecified abdominal pain: Secondary | ICD-10-CM | POA: Insufficient documentation

## 2022-09-04 DIAGNOSIS — R1032 Left lower quadrant pain: Secondary | ICD-10-CM | POA: Diagnosis not present

## 2022-09-04 DIAGNOSIS — K5900 Constipation, unspecified: Secondary | ICD-10-CM | POA: Diagnosis not present

## 2022-09-04 LAB — CBC
HCT: 33.1 % — ABNORMAL LOW (ref 36.0–46.0)
Hemoglobin: 10 g/dL — ABNORMAL LOW (ref 12.0–15.0)
MCH: 21.9 pg — ABNORMAL LOW (ref 26.0–34.0)
MCHC: 30.2 g/dL (ref 30.0–36.0)
MCV: 72.4 fL — ABNORMAL LOW (ref 80.0–100.0)
Platelets: 247 10*3/uL (ref 150–400)
RBC: 4.57 MIL/uL (ref 3.87–5.11)
RDW: 16.4 % — ABNORMAL HIGH (ref 11.5–15.5)
WBC: 6.1 10*3/uL (ref 4.0–10.5)
nRBC: 0 % (ref 0.0–0.2)

## 2022-09-04 LAB — URINALYSIS, ROUTINE W REFLEX MICROSCOPIC
Bacteria, UA: NONE SEEN
Bilirubin Urine: NEGATIVE
Glucose, UA: 500 mg/dL — AB
Hgb urine dipstick: NEGATIVE
Ketones, ur: NEGATIVE mg/dL
Leukocytes,Ua: NEGATIVE
Nitrite: NEGATIVE
Protein, ur: NEGATIVE mg/dL
Specific Gravity, Urine: 1.022 (ref 1.005–1.030)
pH: 6.5 (ref 5.0–8.0)

## 2022-09-04 LAB — COMPREHENSIVE METABOLIC PANEL
ALT: 9 U/L (ref 0–44)
AST: 14 U/L — ABNORMAL LOW (ref 15–41)
Albumin: 4.3 g/dL (ref 3.5–5.0)
Alkaline Phosphatase: 46 U/L (ref 38–126)
Anion gap: 7 (ref 5–15)
BUN: 12 mg/dL (ref 6–20)
CO2: 28 mmol/L (ref 22–32)
Calcium: 9.4 mg/dL (ref 8.9–10.3)
Chloride: 100 mmol/L (ref 98–111)
Creatinine, Ser: 0.74 mg/dL (ref 0.44–1.00)
GFR, Estimated: >60 mL/min (ref >60)
Glucose, Bld: 245 mg/dL — ABNORMAL HIGH (ref 70–99)
Potassium: 3.3 mmol/L — ABNORMAL LOW (ref 3.5–5.1)
Sodium: 135 mmol/L (ref 135–145)
Total Bilirubin: 0.4 mg/dL (ref 0.3–1.2)
Total Protein: 7.5 g/dL (ref 6.5–8.1)

## 2022-09-04 LAB — WET PREP, GENITAL
Clue Cells Wet Prep HPF POC: NONE SEEN
Sperm: NONE SEEN
Trich, Wet Prep: NONE SEEN
WBC, Wet Prep HPF POC: <10 (ref <10)
Yeast Wet Prep HPF POC: NONE SEEN

## 2022-09-04 LAB — LIPASE, BLOOD: Lipase: 40 U/L (ref 11–51)

## 2022-09-04 LAB — PREGNANCY, URINE: Preg Test, Ur: NEGATIVE

## 2022-09-04 NOTE — ED Triage Notes (Addendum)
Pt via pov from home with LLQ abdominal pain x 3 days. Pt reports that it has progressively worsened, and now it is tender to the touch. Pt is T1D. Denies n/v/d; denies urinary symptoms. Pt alert & oriented, nad noted.

## 2022-09-04 NOTE — ED Provider Notes (Signed)
[facility] Provider Note   CSN: [id number] Arrival date & time: 09/04/22  1559     History  Chief Complaint  Patient presents with   Abdominal Pain    Christie [name] is a 37 y.o. female.  HPI   38 year old female presents emergency department with mid abdominal pain for the past 3 days.  Patient states that initially started on the left side but it is now also mildly present on the right side.  It is only painful with movement and pressing on it.  She has chronic constipation, did recently do a home Fleet enema with mild relief.  Has never had abdominal pain associated to constipation before.  Patient has had a regular menses, no active vaginal bleeding but she has had slight increase in vaginal discharge.  No odor.  Denies any dysuria, she has urinary frequency at baseline in regards to type 1 diabetes.  Denies any fever.  No diarrhea.  No fever or chills.  Appetites been baseline.  Home Medications Prior to Admission medications   Medication Sig Start Date End Date Taking? Authorizing Provider  albuterol (VENTOLIN HFA) 108 (90 Base) MCG/ACT inhaler INHALE 1-2 PUFFS BY MOUTH EVERY 6 HOURS AS NEEDED FOR WHEEZE OR SHORTNESS OF BREATH 12/26/21   Mosie Lukes, MD  aspirin-acetaminophen-caffeine (EXCEDRIN MIGRAINE) [phone] MG tablet Take 2 tablets by mouth every 6 (six) hours as needed for headache or migraine.    [provider]  Continuous Blood Gluc Receiver ([facility]) [place] See admin instructions. 09/07/20   [provider]  Continuous Blood Gluc Sensor (DEXCOM G6 SENSOR) MISC CHANGE SENSOR EVERY 10 DAYS    [provider]  Continuous Blood Gluc Transmit (DEXCOM G6 TRANSMITTER) MISC AS DIRECTED FOR 90 DAYS 08/19/21   [provider]  DULoxetine (CYMBALTA) 30 MG capsule Take 3 capsules (90 mg total) by mouth daily. 02/26/22   Marrian Salvage, FNP  hydrOXYzine (ATARAX) 10 MG tablet Take  1 tablet (10 mg total) by mouth 3 (three) times daily as needed for anxiety. 11/14/21   Ival Bible, MD  insulin aspart (NOVOLOG) 100 UNIT/ML injection TAKE AFTER MEALS- 200-250 TAKE 4 UNITS, 251-300 TAKE 6 UNITS, 301-350 8 UNITS , 351-400 10 UNITS. Patient taking differently: Inject into the skin 3 (three) times daily after meals. She uses a sliding scale. 01/27/20   Lorin Glass, PA-C  Insulin Disposable Pump (OMNIPOD 5 G6 POD, GEN 5,) MISC SMARTSIG:SUB-Q Every 3 Days 02/26/22   [provider]  Insulin Pen Needle 31G X 8 MM MISC As needed for insulin injections 05/17/15   Jorje Guild, NP  meloxicam (MOBIC) 15 MG tablet TAKE 1 TABLET (15 MG TOTAL) BY MOUTH DAILY. 12/25/21   Debbrah Alar, NP  methocarbamol (ROBAXIN) 500 MG tablet Take 1 tablet (500 mg total) by mouth at bedtime as needed for muscle spasms. 11/24/21   Marrian Salvage, FNP  ondansetron (ZOFRAN-ODT) 8 MG disintegrating tablet Take 1 tablet (8 mg total) by mouth every 8 (eight) hours as needed for nausea or vomiting. 11/28/21   Marrian Salvage, FNP  rizatriptan (MAXALT) 10 MG tablet Take 1 tablet (10 mg total) by mouth as needed for migraine. May repeat in 2 hours if needed.  Maximum 2 tablets in 24 hours. 04/30/22   Pieter Partridge, DO  topiramate (TOPAMAX) 25 MG tablet Take 1 tablet (25 mg total) by mouth at bedtime. 04/30/22   Pieter Partridge, DO  traZODone (DESYREL) 50 MG tablet Take 1 tablet (50 mg total) by mouth at bedtime as needed for sleep. 11/14/21   Ival Bible, MD      Allergies    Banana, Adhesive [tape], Citrus, and Tapentadol    Review of Systems   Review of Systems  Constitutional:  Negative for fever.  Respiratory:  Negative for shortness of breath.   Cardiovascular:  Negative for chest pain.  Gastrointestinal:  Positive for abdominal pain and constipation. Negative for diarrhea, nausea and vomiting.  Genitourinary:  Positive for vaginal bleeding and vaginal discharge.  Negative for dysuria, flank pain, frequency and pelvic pain.  Musculoskeletal:  Negative for back pain.  Skin:  Negative for rash.  Neurological:  Negative for headaches.    Physical Exam Updated Vital Signs BP 129/82 (BP Location: Left Arm)   Pulse 63   Temp 98.4 F (36.9 C) (Oral)   Resp 16   Ht '5\' 9"'$  (1.753 m)   Wt 103.9 kg   LMP 08/19/2022 (Exact Date)   SpO2 100%   BMI 33.82 kg/m  Physical Exam Vitals and nursing note reviewed.  Constitutional:      General: She is not in acute distress.    Appearance: Normal appearance. She is not ill-appearing.  HENT:     Head: Normocephalic.     Mouth/Throat:     Mouth: Mucous membranes are moist.  Cardiovascular:     Rate and Rhythm: Normal rate.  Pulmonary:     Effort: Pulmonary effort is normal. No respiratory distress.  Abdominal:     General: Bowel sounds are normal. There is no distension.     Palpations: Abdomen is soft.     Tenderness: There is abdominal tenderness. There is no guarding or rebound.  Genitourinary:    Cervix: Discharge present. No cervical motion tenderness.     Adnexa: Right adnexa normal and left adnexa normal.       Right: No tenderness.         Left: No tenderness.    Skin:    General: Skin is warm.  Neurological:     Mental Status: She is alert and oriented to person, place, and time. Mental status is at baseline.  Psychiatric:        Mood and Affect: Mood normal.     ED Results / Procedures / Treatments   Labs (all labs ordered are listed, but only abnormal results are displayed) Labs Reviewed  COMPREHENSIVE METABOLIC PANEL - Abnormal; Notable for the following components:      Result Value   Potassium 3.3 (*)    Glucose, Bld 245 (*)    AST 14 (*)    All other components within normal limits  CBC - Abnormal; Notable for the following components:   Hemoglobin 10.0 (*)    HCT 33.1 (*)    MCV 72.4 (*)    MCH 21.9 (*)    RDW 16.4 (*)    All other components within normal limits   URINALYSIS, ROUTINE W REFLEX MICROSCOPIC - Abnormal; Notable for the following components:   Glucose, UA 500 (*)    All other components within normal limits  WET PREP, GENITAL  LIPASE, BLOOD  PREGNANCY, URINE  GC/CHLAMYDIA PROBE AMP ([place]) NOT AT [facility]    EKG None  Radiology No results found.  Procedures Procedures    Medications Ordered in ED Medications - No data to display  ED Course/ Medical Decision Making/ A&P  Medical Decision Making Amount and/or Complexity of Data Reviewed Labs: ordered. Radiology: ordered.   37 year old female presents to the emergency department with abdominal pain that is only noticeable with certain movements and palpation.  She does struggle with chronic constipation, recently gave herself a Fleet enema.  Vital signs are normal.  Abdomen is soft and benign but point tender with certain palpation.  She also mentioned possible increased vaginal discharge.  Blood work is normal and baseline for the patient.  Pelvic was unremarkable, wet prep shows no acute findings.  No adnexal tenderness.  X-ray shows moderate fecal retention without any other signs of complication.  Plan for outpatient treatment of chronic constipation and follow-up.  Abdomen is soft, no other indication for emergent CT scan or further evaluation.  Patient at this time appears safe and stable for discharge and close outpatient follow up. Discharge plan and strict return to ED precautions discussed, patient verbalizes understanding and agreement.        Final Clinical Impression(s) / ED Diagnoses Final diagnoses:  None    Rx / DC Orders ED Discharge Orders     None         Lorelle Gibbs, DO 09/04/22 2236

## 2022-09-04 NOTE — Discharge Instructions (Addendum)
You have been seen and discharged from the emergency department.  Your blood work was normal for you.  Pelvic exam revealed no signs of infection but will be sent for culture.  If there is any abnormal results we will contact you.  X-ray showed moderate fecal burden but no other complications.  Continue to treat constipation.  Follow-up with your primary provider for further evaluation and further care. Take home medications as prescribed. If you have any worsening symptoms or further concerns for your health please return to an emergency department for further evaluation.

## 2022-09-05 ENCOUNTER — Ambulatory Visit: Payer: Medicaid Other | Admitting: Family Medicine

## 2022-09-05 VITALS — BP 122/78 | HR 85 | Ht 69.0 in | Wt 226.0 lb

## 2022-09-05 DIAGNOSIS — R102 Pelvic and perineal pain: Secondary | ICD-10-CM | POA: Diagnosis not present

## 2022-09-05 LAB — GC/CHLAMYDIA PROBE AMP (~~LOC~~) NOT AT ARMC
Chlamydia: NEGATIVE
Comment: NEGATIVE
Comment: NORMAL
Neisseria Gonorrhea: NEGATIVE

## 2022-09-05 NOTE — Progress Notes (Signed)
Patient having abdominal pain that comes and goes since Saturday. Patient states abdomen is tender to the touch. Patient states she is also having bleeding with intercourse. Kathrene Alu, RN

## 2022-09-05 NOTE — Progress Notes (Signed)
   Subjective:    Patient ID: Christie [name], female    DOB: Mar 05, 1986, 37 y.o.   MRN: [id number]  HPI  Patient seen for lower quadrant abdominal pain over the past 4 days.  Pain nonradiating worse with pressing on it.  She has been doing enemas at home to help with the constipation.  She is wanting to make sure everything is.  She does have some mild vaginal discharge occasionally, not increased at this point.  She did get tested for STIs recently in the emergency department.  These were all normal.  Her periods are regular.  Last menstrual period February 11th.  Review of Systems     Objective:   Physical Exam Vitals reviewed. Exam conducted with a chaperone present.  Constitutional:      Appearance: Normal appearance.  Cardiovascular:     Rate and Rhythm: Normal rate.  Pulmonary:     Effort: Pulmonary effort is normal.  Abdominal:     General: Abdomen is flat. There is no distension.     Palpations: Abdomen is soft.     Tenderness: There is abdominal tenderness (LLQ). There is no guarding or rebound.     Hernia: There is no hernia in the left inguinal area or right inguinal area.  Genitourinary:    Labia:        Right: No rash, tenderness or lesion.        Left: No rash, tenderness or lesion.      Vagina: No signs of injury and foreign body. No vaginal discharge, erythema, tenderness or bleeding.     Cervix: No cervical motion tenderness, friability or erythema.  Lymphadenopathy:     Lower Body: No right inguinal adenopathy. No left inguinal adenopathy.  Neurological:     Mental Status: She is alert.  Psychiatric:        Mood and Affect: Mood normal.        Behavior: Behavior normal.        Thought Content: Thought content normal.        Judgment: Judgment normal.       Assessment & Plan:  1. Pelvic pain LLQ.  Based on her LMP, she is at time for ovulation.  This may represent an ovarian cyst.  Will continue with pain medicine.  I did offer an ultrasound -if she  still has pain on Monday, will obtain ultrasound.

## 2022-09-14 ENCOUNTER — Encounter: Payer: Self-pay | Admitting: Family Medicine

## 2022-09-14 DIAGNOSIS — K900 Celiac disease: Secondary | ICD-10-CM | POA: Diagnosis not present

## 2022-09-14 DIAGNOSIS — Z794 Long term (current) use of insulin: Secondary | ICD-10-CM | POA: Diagnosis not present

## 2022-09-14 DIAGNOSIS — E1065 Type 1 diabetes mellitus with hyperglycemia: Secondary | ICD-10-CM | POA: Diagnosis not present

## 2022-09-14 DIAGNOSIS — N939 Abnormal uterine and vaginal bleeding, unspecified: Secondary | ICD-10-CM

## 2022-09-14 DIAGNOSIS — E669 Obesity, unspecified: Secondary | ICD-10-CM | POA: Diagnosis not present

## 2022-09-14 DIAGNOSIS — R102 Pelvic and perineal pain: Secondary | ICD-10-CM

## 2022-09-18 ENCOUNTER — Ambulatory Visit: Payer: Medicaid Other | Admitting: Family

## 2022-09-18 ENCOUNTER — Telehealth: Payer: Self-pay | Admitting: General Practice

## 2022-09-18 NOTE — Telephone Encounter (Signed)
Left message on VM informing her to return call to Radiology to schedule [place] appt.  Also asked pt to contact our office with any questions or concerns.

## 2022-09-22 ENCOUNTER — Other Ambulatory Visit (HOSPITAL_BASED_OUTPATIENT_CLINIC_OR_DEPARTMENT_OTHER): Payer: Medicaid Other

## 2022-09-27 NOTE — Progress Notes (Deleted)
NEUROLOGY FOLLOW UP OFFICE NOTE  Christie [name] [id number]  Assessment/Plan:   Migraine without aura, without status migrainosus, intractable      Migraine prevention:  start topiramate 25mg  at bedtime.   Migraine rescue:  stop sumatriptan.  Start rizatriptan 10mg .  Zofran ODT 8mg  Limit use of pain relievers to no more than 2 days out of week to prevent risk of rebound or medication-overuse headache. Keep headache diary Follow up 4-5 months.       Subjective:  Christie [name] is a 37 year old female with Bipolar 1 disorder, asthma, DM I, and alpha thaleassemia trait who follows up for migraine.   UPDATE: Last visit, started topiramate.  Rizatriptan    Intensity:  severe Duration:  4 days *** Frequency:  ***     Current NSAIDS/analgesics:  Excedrin Migraine, meloxicam Current triptans:  rizatriptan 10mg  Current ergotamine:  none Current anti-emetic:  ondansetron ODT 8mg  Current muscle relaxants:  methocarbamol Current Antihypertensive medications:  none Current Antidepressant medications:  duloxetine 90mg  daily, trazodone 50mg  QHS PRN (for insomnia) Current Anticonvulsant medications:  topiramate 25mg  QHS Current anti-CGRP:  none Other therapy:  none Hormone/birth control:  none Other medications:  hydroxyzine, insulin, Lantus     Caffeine:  occasional coffee.  Cut down on soda to a couple of days a month. Diet:  1 gallon water daily.  Trying not to skip meals.   Exercise:   Depression:  yes; Anxiety:  Yes.  Many stressors - financial, caring for family - improved since starting hydroxyzine Sleep hygiene:  poor - trouble staying asleep.   HISTORY:  Onset:  Since childhood.  This year has progressively become more frequent and intractable.   Location:  behind left eye Quality:  squeezing Intensity:  9/10 Aura:  absent Prodrome:  absent Associated symptoms:  Nausea, photophobia, phonophobia.  She denies associated vomiting, visual disturbance or  weakness. She also has numbness and tingling from the left side of her neck, down the arm and to the wrist.   Duration:  2 days (longest lasted 3 days) Frequency:  Initially once a week, recently 1 to 2 times a month.  Started taking hydroxyzine for anxiety which may have helped.   Frequency of abortive medication: just as needed Triggers:  stress Relieving factors:  nothing Activity:  aggravates   She required 3 ED visits between 5/29 and 6/10 for treatment.   Past NSAIDS/analgesics:  Fioricet, ibuprofen, ASA Past abortive triptans:  sumatriptan tab Past abortive ergotamine:  none Past muscle relaxants:  cyclobenzaprine Past anti-emetic:  metoclopramide, promethazine Past antihypertensive medications:  amlodipine, furosemide Past antidepressant medications:  sertraline, bupropion Past anticonvulsant medications:  none Past anti-CGRP:  none Past vitamins/Herbal/Supplements:  B12 Past antihistamines/decongestants:  cetirizine Other past therapies:  none     Family history of headache:  mom, grandmother  PAST MEDICAL HISTORY: Past Medical History:  Diagnosis Date   Abnormal Pap smear    colpo scheduled   Alpha thalassemia trait    Anemia    Anxiety    Asthma    Bipolar 1 disorder (HCC)    Chlamydia    Depression    Diabetes mellitus    type 1 on insulin   Fx ankle    history of left ankle fx at age 72   Headache(784.0)    Hx of migraines    Pregnancy induced hypertension    Urinary tract infection     MEDICATIONS: Current Outpatient Medications on File Prior to Visit  Medication Sig  Dispense Refill   albuterol (VENTOLIN HFA) 108 (90 Base) MCG/ACT inhaler INHALE 1-2 PUFFS BY MOUTH EVERY 6 HOURS AS NEEDED FOR WHEEZE OR SHORTNESS OF BREATH 18 each 1   aspirin-acetaminophen-caffeine (EXCEDRIN MIGRAINE) 250-250-65 MG tablet Take 2 tablets by mouth every 6 (six) hours as needed for headache or migraine.     Continuous Blood Gluc Receiver ([place]) DEVI See  admin instructions.     Continuous Blood Gluc Sensor (DEXCOM G6 SENSOR) MISC CHANGE SENSOR EVERY 10 DAYS     Continuous Blood Gluc Transmit (DEXCOM G6 TRANSMITTER) MISC AS DIRECTED FOR 90 DAYS     DULoxetine (CYMBALTA) 30 MG capsule Take 3 capsules (90 mg total) by mouth daily. 270 capsule 0   hydrOXYzine (ATARAX) 10 MG tablet Take 1 tablet (10 mg total) by mouth 3 (three) times daily as needed for anxiety. 30 tablet 0   insulin aspart (NOVOLOG) 100 UNIT/ML injection TAKE AFTER MEALS- 200-250 TAKE 4 UNITS, 251-300 TAKE 6 UNITS, 301-350 8 UNITS , 351-400 10 UNITS. (Patient taking differently: Inject into the skin 3 (three) times daily after meals. She uses a sliding scale.) 20 mL 2   Insulin Disposable Pump (OMNIPOD 5 G6 POD, GEN 5,) MISC SMARTSIG:SUB-Q Every 3 Days     Insulin Pen Needle 31G X 8 MM MISC As needed for insulin injections 100 each 1   meloxicam (MOBIC) 15 MG tablet TAKE 1 TABLET (15 MG TOTAL) BY MOUTH DAILY. 20 tablet 0   methocarbamol (ROBAXIN) 500 MG tablet Take 1 tablet (500 mg total) by mouth at bedtime as needed for muscle spasms. 20 tablet 0   ondansetron (ZOFRAN-ODT) 8 MG disintegrating tablet Take 1 tablet (8 mg total) by mouth every 8 (eight) hours as needed for nausea or vomiting. 20 tablet 0   rizatriptan (MAXALT) 10 MG tablet Take 1 tablet (10 mg total) by mouth as needed for migraine. May repeat in 2 hours if needed.  Maximum 2 tablets in 24 hours. 10 tablet 5   topiramate (TOPAMAX) 25 MG tablet Take 1 tablet (25 mg total) by mouth at bedtime. 30 tablet 5   traZODone (DESYREL) 50 MG tablet Take 1 tablet (50 mg total) by mouth at bedtime as needed for sleep. 30 tablet 0   No current facility-administered medications on file prior to visit.    ALLERGIES: Allergies  Allergen Reactions   Banana Anaphylaxis   Adhesive [Tape] Rash   Citrus Rash and Other (See Comments)    Fresh only - Gums bleed   Tapentadol Rash    FAMILY HISTORY: Family History  Problem Relation  Age of Onset   Asthma Mother    Diabetes Mother    Hypertension Mother    Asthma Father    Hypertension Sister    Diabetes Maternal Grandmother    Cancer Maternal Grandmother        breast   Obesity Other    Sleep apnea Other    Anesthesia problems Neg Hx       Objective:  *** General: No acute distress.  Patient appears well-groomed.   Head:  Normocephalic/atraumatic Eyes:  Fundi examined but not visualized Neck: supple, no paraspinal tenderness, full range of motion Heart:  Regular rate and rhythm Lungs:  Clear to auscultation bilaterally Back: No paraspinal tenderness Neurological Exam: alert and oriented to person, place, and time.  Speech fluent and not dysarthric, language intact.  CN II-XII intact. Bulk and tone normal, muscle strength 5/5 throughout.  Sensation to light touch intact.  Deep tendon reflexes 2+ throughout,.  Finger to nose testing intact.  Gait normal, Romberg negative.   Metta Clines, DO  CC: Marrian Salvage, [place]

## 2022-09-28 ENCOUNTER — Ambulatory Visit: Payer: Medicaid Other | Admitting: Family

## 2022-09-28 ENCOUNTER — Encounter: Payer: Self-pay | Admitting: Family

## 2022-09-28 VITALS — BP 118/68 | HR 91 | Resp 18 | Ht 69.0 in | Wt 226.6 lb

## 2022-09-28 DIAGNOSIS — N939 Abnormal uterine and vaginal bleeding, unspecified: Secondary | ICD-10-CM | POA: Diagnosis not present

## 2022-09-28 NOTE — Progress Notes (Signed)
Christie [name] is a 37 y.o. female with the following history as recorded in Epic[employer]:  Patient Active Problem List   Diagnosis Date Noted   MDD (major depressive disorder), recurrent severe, without psychosis ([place]) 11/12/2021   Mastalgia 11/06/2018   Bilateral lower extremity edema 08/24/2018   Poorly controlled diabetes mellitus ([place]) 08/18/2018   Anxiety 08/12/2018   Hyperglycemia 08/12/2018   DKA (diabetic ketoacidoses) 07/11/2018   Alpha+ thalassemia trait 03/12/2018   History of loop electrical excision procedure (LEEP) 09/23/2017   Back pain 04/27/2016   Abnormal Papanicolaou smear of cervix with positive human papilloma virus (HPV) test 09/02/2015   Adjustment disorder with depressed mood 08/03/2015   Asthma, mild intermittent 07/06/2013   Type I diabetes mellitus with complication, uncontrolled ([place]) 06/18/2011   History of migraine during pregnancy 06/18/2011   Migraine 06/18/2011    Current Outpatient Medications  Medication Sig Dispense Refill   albuterol (VENTOLIN HFA) 108 (90 Base) MCG/ACT inhaler INHALE 1-2 PUFFS BY MOUTH EVERY 6 HOURS AS NEEDED FOR WHEEZE OR SHORTNESS OF BREATH 18 each 1   aspirin-acetaminophen-caffeine (EXCEDRIN MIGRAINE) 250-250-65 MG tablet Take 2 tablets by mouth every 6 (six) hours as needed for headache or migraine.     Continuous Blood Gluc Receiver ([place]) DEVI See admin instructions.     Continuous Blood Gluc Sensor (DEXCOM G6 SENSOR) MISC CHANGE SENSOR EVERY 10 DAYS     Continuous Blood Gluc Transmit (DEXCOM G6 TRANSMITTER) MISC AS DIRECTED FOR 90 DAYS     DULoxetine (CYMBALTA) 30 MG capsule Take 3 capsules (90 mg total) by mouth daily. 270 capsule 0   hydrOXYzine (ATARAX) 10 MG tablet Take 1 tablet (10 mg total) by mouth 3 (three) times daily as needed for anxiety. 30 tablet 0   insulin aspart (NOVOLOG) 100 UNIT/ML injection TAKE AFTER MEALS- 200-250 TAKE 4 UNITS, 251-300 TAKE 6 UNITS, 301-350 8 UNITS , 351-400 10 UNITS.  (Patient taking differently: Inject into the skin 3 (three) times daily after meals. She uses a sliding scale.) 20 mL 2   insulin glargine (LANTUS) 100 UNIT/ML injection Inject 40 Units into the skin daily.     Insulin Pen Needle 31G X 8 MM MISC As needed for insulin injections 100 each 1   meloxicam (MOBIC) 15 MG tablet TAKE 1 TABLET (15 MG TOTAL) BY MOUTH DAILY. 20 tablet 0   methocarbamol (ROBAXIN) 500 MG tablet Take 1 tablet (500 mg total) by mouth at bedtime as needed for muscle spasms. 20 tablet 0   ondansetron (ZOFRAN-ODT) 8 MG disintegrating tablet Take 1 tablet (8 mg total) by mouth every 8 (eight) hours as needed for nausea or vomiting. 20 tablet 0   rizatriptan (MAXALT) 10 MG tablet Take 1 tablet (10 mg total) by mouth as needed for migraine. May repeat in 2 hours if needed.  Maximum 2 tablets in 24 hours. 10 tablet 5   topiramate (TOPAMAX) 25 MG tablet Take 1 tablet (25 mg total) by mouth at bedtime. 30 tablet 5   traZODone (DESYREL) 50 MG tablet Take 1 tablet (50 mg total) by mouth at bedtime as needed for sleep. 30 tablet 0   Insulin Disposable Pump (OMNIPOD 5 G6 POD, GEN 5,) MISC SMARTSIG:SUB-Q Every 3 Days (Patient not taking: Reported on 09/28/2022)     No current facility-administered medications for this visit.    Allergies: Banana, Adhesive [tape], Citrus, and Tapentadol  Past Medical History:  Diagnosis Date   Abnormal Pap smear    colpo scheduled  Alpha thalassemia trait    Anemia    Anxiety    Asthma    Bipolar 1 disorder (HCC)    Chlamydia    Depression    Diabetes mellitus    type 1 on insulin   Fx ankle    history of left ankle fx at age 27   Headache(784.0)    Hx of migraines    Pregnancy induced hypertension    Urinary tract infection     Past Surgical History:  Procedure Laterality Date   CESAREAN SECTION Bilateral 08/21/2018   Procedure: CESAREAN SECTION WITH BILATERAL TUBAL LIGATION;  Surgeon: Sloan Leiter, MD;  Location: [facility];   Service: Obstetrics;  Laterality: Bilateral;   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   TUBAL LIGATION     WISDOM TOOTH EXTRACTION      Family History  Problem Relation Age of Onset   Asthma Mother    Diabetes Mother    Hypertension Mother    Asthma Father    Hypertension Sister    Diabetes Maternal Grandmother    Cancer Maternal Grandmother        breast   Obesity Other    Sleep apnea Other    Anesthesia problems Neg Hx     Social History   Tobacco Use   Smoking status: Never    Passive exposure: Never   Smokeless tobacco: Never  Substance Use Topics   Alcohol use: Yes    Comment: occ    Subjective:   Has had 3 episodes of bleeding with intercourse since the end of February; did go to the ER and her GYN with pelvic pain during this same period; she is actually scheduled for a pelvic ultrasound tomorrow; has not let her GYN know about the intermittent bleeding;  Overall, she notes she is making good strides to take better care of herself; has been working with her endocrinologist to get her diabetes under control; has also been able to get dental work completed;   Objective:  Vitals:   09/28/22 1410  BP: 118/68  Pulse: 91  Resp: 18  SpO2: 100%  Weight: 226 lb 9.6 oz (102.8 kg)  Height: 5\' 9"  (1.753 m)    General: Well developed, well nourished, in no acute distress  Skin : Warm and dry.  Head: Normocephalic and atraumatic  Lungs: Respirations unlabored;  Neurologic: Alert and oriented; speech intact; face symmetrical; moves all extremities well; CNII-XII intact without focal deficit   Assessment:  1. Abnormal uterine bleeding     Plan:  Patient is already scheduled for pelvic U/S tomorrow per her GYN; she will plan to follow up there regardless of U/S results- she understands to let her GYN know about the episodes of bleeding with intercourse.   No follow-ups on file.  No orders of the defined types were placed in this encounter.   Requested  Prescriptions    No prescriptions requested or ordered in this encounter

## 2022-09-29 ENCOUNTER — Ambulatory Visit (HOSPITAL_BASED_OUTPATIENT_CLINIC_OR_DEPARTMENT_OTHER)
Admission: RE | Admit: 2022-09-29 | Discharge: 2022-09-29 | Disposition: A | Discharge status: Home / Self Care | Payer: Medicaid Other | Source: Ambulatory Visit | Attending: Family Medicine | Admitting: Family Medicine

## 2022-09-29 DIAGNOSIS — N939 Abnormal uterine and vaginal bleeding, unspecified: Secondary | ICD-10-CM | POA: Diagnosis not present

## 2022-09-29 DIAGNOSIS — R102 Pelvic and perineal pain: Secondary | ICD-10-CM | POA: Insufficient documentation

## 2022-10-01 ENCOUNTER — Encounter: Payer: Self-pay | Admitting: Neurology

## 2022-10-01 ENCOUNTER — Ambulatory Visit: Payer: Medicaid Other | Admitting: Neurology

## 2022-10-04 ENCOUNTER — Ambulatory Visit (INDEPENDENT_AMBULATORY_CARE_PROVIDER_SITE_OTHER): Payer: Medicaid Other

## 2022-10-04 ENCOUNTER — Telehealth: Payer: Medicaid Other | Admitting: Physician Assistant

## 2022-10-04 ENCOUNTER — Other Ambulatory Visit (HOSPITAL_COMMUNITY)
Admission: RE | Admit: 2022-10-04 | Discharge: 2022-10-04 | Disposition: A | Discharge status: Home / Self Care | Payer: Medicaid Other | Source: Ambulatory Visit | Attending: Family Medicine | Admitting: Family Medicine

## 2022-10-04 VITALS — BP 113/82 | HR 89 | Wt 224.0 lb

## 2022-10-04 DIAGNOSIS — N898 Other specified noninflammatory disorders of vagina: Secondary | ICD-10-CM | POA: Diagnosis not present

## 2022-10-04 DIAGNOSIS — K6289 Other specified diseases of anus and rectum: Secondary | ICD-10-CM

## 2022-10-04 NOTE — Progress Notes (Signed)
SUBJECTIVE:  37 y.o. female complains of clear vaginal discharge for 3 week(s). Denies abnormal vaginal bleeding or significant pelvic pain or fever. No UTI symptoms. Denies history of known exposure to STD.  Patient's last menstrual period was 09/17/2022 (exact date).  OBJECTIVE:  She appears well, afebrile. Urine dipstick: not done.  ASSESSMENT:  Vaginal Odor   PLAN:  GC, chlamydia, trichomonas, BVAG, CVAG probe sent to lab. Treatment: To be determined once lab results are received ROV prn if symptoms persist or worsen.   Negin Hegg l Gerica Koble, CMA

## 2022-10-05 ENCOUNTER — Ambulatory Visit
Admission: RE | Admit: 2022-10-05 | Discharge: 2022-10-05 | Disposition: A | Discharge status: Home / Self Care | Payer: Medicaid Other | Source: Ambulatory Visit | Attending: Urgent Care | Admitting: Urgent Care

## 2022-10-05 VITALS — BP 147/90 | HR 87 | Temp 98.2°F | Resp 16

## 2022-10-05 DIAGNOSIS — K602 Anal fissure, unspecified: Secondary | ICD-10-CM | POA: Insufficient documentation

## 2022-10-05 DIAGNOSIS — K5900 Constipation, unspecified: Secondary | ICD-10-CM | POA: Diagnosis not present

## 2022-10-05 DIAGNOSIS — R21 Rash and other nonspecific skin eruption: Secondary | ICD-10-CM | POA: Insufficient documentation

## 2022-10-05 LAB — CERVICOVAGINAL ANCILLARY ONLY
Bacterial Vaginitis (gardnerella): NEGATIVE
Candida Glabrata: NEGATIVE
Candida Vaginitis: NEGATIVE
Chlamydia: NEGATIVE
Comment: NEGATIVE
Comment: NEGATIVE
Comment: NEGATIVE
Comment: NEGATIVE
Comment: NEGATIVE
Comment: NORMAL
Neisseria Gonorrhea: NEGATIVE
Trichomonas: NEGATIVE

## 2022-10-05 MED ORDER — DOCUSATE SODIUM 100 MG PO CAPS: 100.0000 mg | ORAL_CAPSULE | Freq: Two times a day (BID) | ORAL | 0 refills | Status: DC

## 2022-10-05 MED ORDER — LIDOCAINE (ANORECTAL) 5 % EX GEL: CUTANEOUS | 0 refills | Status: DC

## 2022-10-05 MED ORDER — CEPHALEXIN 500 MG PO CAPS: 500.0000 mg | ORAL_CAPSULE | Freq: Three times a day (TID) | ORAL | 0 refills | Status: DC

## 2022-10-05 NOTE — Progress Notes (Signed)
Because of having obstructive type symptoms with no history of a hemorrhoid, I feel your condition warrants further evaluation and I recommend that you be seen in a face to face visit.   NOTE: There will be NO CHARGE for this eVisit   If you are having a true medical emergency please call 911.      For an urgent face to face visit, [name] has eight urgent care centers for your convenience:   NEW!! [place] [facility] [place] [facility] [place] Get Driving Directions [id number] [address] [place], [place] [facility] [place] [facility] [place] Get Driving Directions [id number] [address] [place], [place] [address]   [place] [facility]) Get Driving Directions [id number] [address] [place], [place] [address]  [place] [facility] [place] [facility]) Get Driving Directions [id number] [address] [place],  [place]  [address]  [place] [facility] [place] [facility] at [facility] Get Driving Directions  [name],  [place] [address]   [place] [facility] Get Driving Directions [id number] [address] [place], [place], [place] [address]   [place] [facility] Get Driving Directions  [id number] [address] [place],  [address]   [place] [facility] [place] Get Driving Directions [id number] [address] [place],  [address]  Your MyChart E-visit questionnaire answers were reviewed by a board certified advanced clinical practitioner to complete your personal care plan based on your specific symptoms.  Thank you for using e-Visits.   I have spent 5 minutes in review of e-visit questionnaire, review and updating patient chart, medical decision making and response to patient.   Mar Daring, PA-C

## 2022-10-05 NOTE — Discharge Instructions (Signed)
Start Keflex to address the infection of the labia on the left side.  Make sure you avoid any kind of shaving or products to the hair.  I would like for you to do a stool softener and use lidocaine gel over the inguinal area.  Primary suspicion is that his anal fissure.  Will let you know about your test results as they come back.  Specifically, the cultures can take much longer but we will be in touch with you about these.  For moderate to severe constipation (not having a bowel movement in more than 3 days) then try to use an enema or Miralax once daily until you have a good bowel movement.  It is not a good idea to use an enema or laxatives daily. If you find you are doing this, then please follow up with a gastroenterologist. Otherwise, a medication you could use daily to help with promoting bowel movements is docusate (Colace) 100mg . It is okay to use this 1-2 times daily as a stool softener.  Try to stay active physically including regular exercise 2-3 times a week.  Make sure you hydrate well every day with about 64 ounces of water daily (that is 2 liters).  Try to avoid carb heavy foods, dairy. This includes cutting out breads, pasta, pizza, pastries, potatoes, rice, starchy foods in general. Eat more fiber as listed below:  Salads - kale, spinach, cabbage, spring mix, arugula Fruits - avocadoes, berries (blueberries, raspberries, blackberries), apples, oranges, pomegranate, grapefruit, kiwi Vegetables - asparagus, cauliflower, broccoli, green beans, brussel sprouts, bell peppers, beets; stay away from or limit starchy vegetables like potatoes, carrots, peas Other general foods - kidney beans, egg whites, almonds, walnuts, sunflower seeds, pumpkin seeds, fat free yogurt, almond milk, flax seeds, quinoa, oats  Meat - It is better to eat lean meats and limit your red meat including pork to once a week.  Wild caught fish, chicken breast are good options as they tend to be leaner sources of good protein.  Still be mindful of the sodium labels for the meats you buy.  DO NOT EAT ANY FOODS ON THIS LIST THAT YOU ARE ALLERGIC TO. For more specific needs, I highly recommend consulting a dietician or nutritionist but this can definitely be a good starting point.

## 2022-10-05 NOTE — ED Provider Notes (Signed)
[facility] - URGENT [facility]ARE CENTER  Note:  This document was prepared using Systems analyst and may include unintentional dictation errors.  MRN: [id number] DOB: 12-05-1985  Subjective:   Christie [name] is a 37 y.o. female presenting for 2-day history of acute onset pain over the buttock area, anal area worse when she sits down.  Patient has longstanding history of constipation and has been using a lot of enemas.  She did have a bowel movement today.  Unfortunately she continues to have this pain.  Over the past day she also noticed a painful bump over the left labia.  Denies drainage of pus or bleeding.  No history of hemorrhoids.  She is not opposed to swabbing these areas for genital herpes.  She just had a vaginal swab done yesterday for STI testing.  Patient does do shaving to the vaginal area.  No current facility-administered medications for this encounter.  Current Outpatient Medications:    albuterol (VENTOLIN HFA) 108 (90 Base) MCG/ACT inhaler, INHALE 1-2 PUFFS BY MOUTH EVERY 6 HOURS AS NEEDED FOR WHEEZE OR SHORTNESS OF BREATH (Patient not taking: Reported on 10/04/2022), Disp: 18 each, Rfl: 1   aspirin-acetaminophen-caffeine (EXCEDRIN MIGRAINE) 250-250-65 MG tablet, Take 2 tablets by mouth every 6 (six) hours as needed for headache or migraine., Disp: , Rfl:    Continuous Blood Gluc Receiver ([place]) [place], See admin instructions., Disp: , Rfl:    Continuous Blood Gluc Sensor (DEXCOM G6 SENSOR) MISC, CHANGE SENSOR EVERY 10 DAYS, Disp: , Rfl:    Continuous Blood Gluc Transmit (DEXCOM G6 TRANSMITTER) MISC, AS DIRECTED FOR 90 DAYS, Disp: , Rfl:    DULoxetine (CYMBALTA) 30 MG capsule, Take 3 capsules (90 mg total) by mouth daily. (Patient not taking: Reported on 10/04/2022), Disp: 270 capsule, Rfl: 0   hydrOXYzine (ATARAX) 10 MG tablet, Take 1 tablet (10 mg total) by mouth 3 (three) times daily as needed for anxiety. (Patient not taking: Reported on  10/04/2022), Disp: 30 tablet, Rfl: 0   insulin aspart (NOVOLOG) 100 UNIT/ML injection, TAKE AFTER MEALS- 200-250 TAKE 4 UNITS, 251-300 TAKE 6 UNITS, 301-350 8 UNITS , 351-400 10 UNITS. (Patient taking differently: Inject into the skin 3 (three) times daily after meals. She uses a sliding scale.), Disp: 20 mL, Rfl: 2   Insulin Disposable Pump (OMNIPOD 5 G6 POD, GEN 5,) MISC, , Disp: , Rfl:    insulin glargine (LANTUS) 100 UNIT/ML injection, Inject 40 Units into the skin daily., Disp: , Rfl:    Insulin Pen Needle 31G X 8 MM MISC, As needed for insulin injections, Disp: 100 each, Rfl: 1   meloxicam (MOBIC) 15 MG tablet, TAKE 1 TABLET (15 MG TOTAL) BY MOUTH DAILY. (Patient not taking: Reported on 10/04/2022), Disp: 20 tablet, Rfl: 0   methocarbamol (ROBAXIN) 500 MG tablet, Take 1 tablet (500 mg total) by mouth at bedtime as needed for muscle spasms., Disp: 20 tablet, Rfl: 0   ondansetron (ZOFRAN-ODT) 8 MG disintegrating tablet, Take 1 tablet (8 mg total) by mouth every 8 (eight) hours as needed for nausea or vomiting., Disp: 20 tablet, Rfl: 0   rizatriptan (MAXALT) 10 MG tablet, Take 1 tablet (10 mg total) by mouth as needed for migraine. May repeat in 2 hours if needed.  Maximum 2 tablets in 24 hours., Disp: 10 tablet, Rfl: 5   topiramate (TOPAMAX) 25 MG tablet, Take 1 tablet (25 mg total) by mouth at bedtime., Disp: 30 tablet, Rfl: 5   traZODone (DESYREL) 50  MG tablet, Take 1 tablet (50 mg total) by mouth at bedtime as needed for sleep., Disp: 30 tablet, Rfl: 0   Allergies  Allergen Reactions   Banana Anaphylaxis   Adhesive [Tape] Rash   Citrus Rash and Other (See Comments)    Fresh only - Gums bleed   Tapentadol Rash    Past Medical History:  Diagnosis Date   Abnormal Pap smear    colpo scheduled   Alpha thalassemia trait    Anemia    Anxiety    Asthma    Bipolar 1 disorder (HCC)    Chlamydia    Depression    Diabetes mellitus    type 1 on insulin   Fx ankle    history of left ankle  fx at age 62   Headache(784.0)    Hx of migraines    Pregnancy induced hypertension    Urinary tract infection      Past Surgical History:  Procedure Laterality Date   CESAREAN SECTION Bilateral 08/21/2018   Procedure: CESAREAN SECTION WITH BILATERAL TUBAL LIGATION;  Surgeon: Sloan Leiter, MD;  Location: [facility];  Service: Obstetrics;  Laterality: Bilateral;   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   TUBAL LIGATION     WISDOM TOOTH EXTRACTION      Family History  Problem Relation Age of Onset   Asthma Mother    Diabetes Mother    Hypertension Mother    Asthma Father    Hypertension Sister    Diabetes Maternal Grandmother    Cancer Maternal Grandmother        breast   Obesity Other    Sleep apnea Other    Anesthesia problems Neg Hx     Social History   Tobacco Use   Smoking status: Never    Passive exposure: Never   Smokeless tobacco: Never  Vaping Use   Vaping Use: Never used  Substance Use Topics   Alcohol use: Yes    Comment: occ   Drug use: No    Comment: last used 1 years ago    ROS   Objective:   Vitals: BP (!) 147/90 (BP Location: Right Arm)   Pulse 87   Temp 98.2 F (36.8 C) (Oral)   Resp 16   LMP 09/17/2022 (Exact Date)   SpO2 96%   Physical Exam Constitutional:      General: She is not in acute distress.    Appearance: Normal appearance. She is well-developed. She is not ill-appearing, toxic-appearing or diaphoretic.  HENT:     Head: Normocephalic and atraumatic.     Nose: Nose normal.     Mouth/Throat:     Mouth: Mucous membranes are moist.  Eyes:     General: No scleral icterus.       Right eye: No discharge.        Left eye: No discharge.     Extraocular Movements: Extraocular movements intact.  Cardiovascular:     Rate and Rhythm: Normal rate.  Pulmonary:     Effort: Pulmonary effort is normal.  Genitourinary:      Comments: CMA Mariela assisted as a chaperone.  Skin:    General: Skin is warm and dry.   Neurological:     General: No focal deficit present.     Mental Status: She is alert and oriented to person, place, and time.  Psychiatric:        Mood and Affect: Mood normal.  Behavior: Behavior normal.     Assessment and Plan :   PDMP not reviewed this encounter.  1. Rash of genital area   2. Constipation, unspecified constipation type   3. Anal fissure     HSV testing pending.  Will also look for HIV and syphilis.  Recommended managing for anal fissures with docusate, stool softeners and dietary modifications.  For the lesion over the right labia majora, recommended cephalexin to cover for an infectious process, folliculitis, infected abrasion secondary to shaving. Counseled patient on potential for adverse effects with medications prescribed/recommended today, ER and return-to-clinic precautions discussed, patient verbalized understanding.    Jaynee Eagles, [place] 10/05/22 [id number]

## 2022-10-05 NOTE — ED Triage Notes (Signed)
Pt reports she may have hemorrhoids as she has anal pain when walking or sitting x 2 days , as she being constipated.    Pt reports painful bump in vaginal x 1 day.

## 2022-10-06 LAB — RPR: RPR Ser Ql: NONREACTIVE

## 2022-10-06 LAB — HIV ANTIBODY (ROUTINE TESTING W REFLEX): HIV Screen 4th Generation wRfx: NONREACTIVE

## 2022-10-07 ENCOUNTER — Telehealth: Payer: Medicaid Other | Admitting: Physician Assistant

## 2022-10-07 DIAGNOSIS — A601 Herpesviral infection of perianal skin and rectum: Secondary | ICD-10-CM | POA: Diagnosis not present

## 2022-10-07 MED ORDER — VALACYCLOVIR HCL 500 MG PO TABS: 500.0000 mg | ORAL_TABLET | Freq: Two times a day (BID) | ORAL | 0 refills | Status: AC

## 2022-10-07 NOTE — Progress Notes (Signed)
E-Visit for Herpes Simplex  We are sorry that you are not feeling well.  Here is how we plan to help!  Based on what you have shared ith me, it looks like you may be having an outbreak/flare-up of genital herpes.  Yes, you can contract herpes just in the genital area, especially if it is more the HSV-2 strain. Being that you have already had a specimen collected we will go ahead and treat to hope for quick relief.   I have prescribed I have prescribed Valacyclovir 500 mg Take one by mouth twice a day for 3 days.    If you have been prescribed long term medications to be taken on a regular basis, it is important to follow the recommendations and take them as ordered.    Outbreaks usually include blisters and open sores in the genital area. Outbreaks that happen after the first time are usually not as severe and do not last as long. Genital Herpes Simplex is a commonly sexually transmitted viral infection that is found worldwide. Most of these genital infections are caused by one or two herpes simplex viruses that is passed from person to person during vaginal, oral, or anal sex. Sometimes, people do not know they have herpes because they do not have any symptoms.  Please be aware that if you have genital herpes you can be contagious even when you are not having rash or flare-up and you may not have any symptoms, even when you are taking suppressive medicines.  Herpes cannot be cured. The disease usually causes most problems during the first few years. After that, the virus is still there, but it causes few to no symptoms. Even when the virus is active, people with herpes can take medicines to reduce and help prevent symptoms.  Herpes is an infection that can cause blisters and open sores on the genital area. Herpes is caused by a virus that is passed from person to person during vaginal, oral, or anal sex. Sometimes, people do not know they have herpes because they do not have any symptoms. Herpes cannot  be cured. The disease usually causes most problems during the first few years. After that, the virus is still there, but it causes few to no symptoms. Even when the virus is active, people with herpes can take medicines to reduce and help prevent symptoms.  If you have been prescribed medications to be taken on a regular basis, it is important to follow the recommendations and take them as ordered.  Some people with herpes never have any symptoms. But other people can develop symptoms within a few weeks of being infected with the herpes virus   Symptoms usually include blisters in the genital area. In women, this area includes the vagina, buttocks, anus, or thighs. In men, this area includes the penis, scrotum, anus, butt, or thighs. The blisters can become painful open sores, which then crust over as they heal. Sometimes, people can have other symptoms that include:  ?Blisters on the mouth or lips ?Fever, headache, or pain in the joints ?Trouble urinating  Outbreaks might occur every month or more often, or just once or twice a year. Sometimes, people can tell when an outbreak will occur, because they feel itching or pain beforehand. Sometimes they do not know that an outbreak is coming because they have no symptoms. Whatever your pattern is, keep in mind that herpes outbreaks usually become less frequent over time as you get older. Certain things, called "triggers," can make  outbreaks more likely to occur. These include stress, sunlight, menstrual periods,or getting sick.  Antiviral therapy can shorten the duration of symptoms and signs in primary infection, which, when untreated, can be associated with significant increase in the symptoms of the disease.  HOME CARE Use a portable bath (such as a "Sitz bath") where you can sit in warm water for about 20 minutes. Your bathtub could also work. Avoid bubble baths.  Keep the genital area clean and dry and avoid tight clothes.  Take over-the-counter  pain medicine such as acetaminophen (brand name: Tylenol) or ibuprofen sample brand names: Advil, Motrin). But avoid aspirin.  Only take medications as instructed by your medical team.  You are most likely to spread herpes to a sex partner when you have blisters and open sores on your body. But it's also possible to spread herpes to your partner when you do not have any symptoms. That is because herpes can be present on your body without causing any symptoms, like blisters or pain.  Telling your sex partner that you have herpes can be hard. But it can help protect them, since there are ways to lower the risk of spreading the infection.   Using a condom every time you have sex  Not having sex when you have symptoms  Not having oral sex if you have blisters or open sores (in the genital area or around your mouth)  MAKE SURE YOU   Understand these instructions. Do not have sex without using a condom until you have been seen by a doctor and as instructed by the provider If you are not better or improved within 7 days, you MUST have a follow up at your doctor or the health department for evaluation. There are other causes of rashes in the genital region.  Thank you for choosing an e-visit.  Your e-visit answers were reviewed by a board certified advanced clinical practitioner to complete your personal care plan. Depending upon the condition, your plan could have included both over the counter or prescription medications.  Please review your pharmacy choice. Make sure the pharmacy is open so you can pick up prescription now. If there is a problem, you may contact your provider through [employer] and have the prescription routed to another pharmacy.  Your safety is important to [place]. If you have drug allergies check your prescription carefully.   For the next 24 hours you can use MyChart to ask questions about today's visit, request a non-urgent call back, or ask for a work or school  excuse. You will get an email in the next two days asking about your experience. I hope that your e-visit has been valuable and will speed your recovery.   I have spent 5 minutes in review of e-visit questionnaire, review and updating patient chart, medical decision making and response to patient.   Mar Daring, PA-C

## 2022-10-09 LAB — HSV CULTURE AND TYPING

## 2022-10-10 ENCOUNTER — Encounter: Payer: Self-pay | Admitting: Family

## 2022-10-15 ENCOUNTER — Other Ambulatory Visit: Payer: Self-pay | Admitting: Family Medicine

## 2022-10-15 ENCOUNTER — Telehealth: Payer: Medicaid Other | Admitting: Nurse Practitioner

## 2022-10-15 ENCOUNTER — Encounter: Payer: Self-pay | Admitting: Family Medicine

## 2022-10-15 DIAGNOSIS — A6000 Herpesviral infection of urogenital system, unspecified: Secondary | ICD-10-CM | POA: Diagnosis not present

## 2022-10-15 DIAGNOSIS — R0602 Shortness of breath: Secondary | ICD-10-CM

## 2022-10-15 MED ORDER — VALACYCLOVIR HCL 500 MG PO TABS: 500.0000 mg | ORAL_TABLET | Freq: Every day | ORAL | 3 refills | Status: DC

## 2022-10-15 MED ORDER — VALACYCLOVIR HCL 500 MG PO TABS: 500.0000 mg | ORAL_TABLET | Freq: Two times a day (BID) | ORAL | 0 refills | Status: DC

## 2022-10-15 MED ORDER — VALACYCLOVIR HCL 1 G PO TABS: 1000.0000 mg | ORAL_TABLET | Freq: Every day | ORAL | 2 refills | Status: DC

## 2022-10-15 NOTE — Progress Notes (Signed)
Christie [name],  We can repeat your dose one additional time. If you continue to have symptoms I would follow up with your primary care or OBGYN to discuss suppressive therapy as needed  I have prescribed I have prescribed Valacyclovir 500 mg Take one by mouth twice a day for 3 days.    If you have been prescribed long term medications to be taken on a regular basis, it is important to follow the recommendations and take them as ordered.    Outbreaks usually include blisters and open sores in the genital area. Outbreaks that happen after the first time are usually not as severe and do not last as long. Genital Herpes Simplex is a commonly sexually transmitted viral infection that is found worldwide. Most of these genital infections are caused by one or two herpes simplex viruses that is passed from person to person during vaginal, oral, or anal sex. Sometimes, people do not know they have herpes because they do not have any symptoms.  Please be aware that if you have genital herpes you can be contagious even when you are not having rash or flare-up and you may not have any symptoms, even when you are taking suppressive medicines.  Herpes cannot be cured. The disease usually causes most problems during the first few years. After that, the virus is still there, but it causes few to no symptoms. Even when the virus is active, people with herpes can take medicines to reduce and help prevent symptoms.  Herpes is an infection that can cause blisters and open sores on the genital area. Herpes is caused by a virus that is passed from person to person during vaginal, oral, or anal sex. Sometimes, people do not know they have herpes because they do not have any symptoms. Herpes cannot be cured. The disease usually causes most problems during the first few years. After that, the virus is still there, but it causes few to no symptoms. Even when the virus is active, people with herpes can take medicines to reduce and help  prevent symptoms.  If you have been prescribed medications to be taken on a regular basis, it is important to follow the recommendations and take them as ordered.  Some people with herpes never have any symptoms. But other people can develop symptoms within a few weeks of being infected with the herpes virus   Symptoms usually include blisters in the genital area. In women, this area includes the vagina, buttocks, anus, or thighs. In men, this area includes the penis, scrotum, anus, butt, or thighs. The blisters can become painful open sores, which then crust over as they heal. Sometimes, people can have other symptoms that include:  ?Blisters on the mouth or lips ?Fever, headache, or pain in the joints ?Trouble urinating  Outbreaks might occur every month or more often, or just once or twice a year. Sometimes, people can tell when an outbreak will occur, because they feel itching or pain beforehand. Sometimes they do not know that an outbreak is coming because they have no symptoms. Whatever your pattern is, keep in mind that herpes outbreaks usually become less frequent over time as you get older. Certain things, called "triggers," can make outbreaks more likely to occur. These include stress, sunlight, menstrual periods,or getting sick.  Antiviral therapy can shorten the duration of symptoms and signs in primary infection, which, when untreated, can be associated with significant increase in the symptoms of the disease.  HOME CARE Use a portable bath (such as  a "Sitz bath") where you can sit in warm water for about 20 minutes. Your bathtub could also work. Avoid bubble baths.  Keep the genital area clean and dry and avoid tight clothes.  Take over-the-counter pain medicine such as acetaminophen (brand name: Tylenol) or ibuprofen sample brand names: Advil, Motrin). But avoid aspirin.  Only take medications as instructed by your medical team.  You are most likely to spread herpes to a sex  partner when you have blisters and open sores on your body. But it's also possible to spread herpes to your partner when you do not have any symptoms. That is because herpes can be present on your body without causing any symptoms, like blisters or pain.  Telling your sex partner that you have herpes can be hard. But it can help protect them, since there are ways to lower the risk of spreading the infection.   Using a condom every time you have sex  Not having sex when you have symptoms  Not having oral sex if you have blisters or open sores (in the genital area or around your mouth)  MAKE SURE YOU   Understand these instructions. Do not have sex without using a condom until you have been seen by a doctor and as instructed by the provider If you are not better or improved within 7 days, you MUST have a follow up at your doctor or the health department for evaluation. There are other causes of rashes in the genital region.  Thank you for choosing an e-visit.  Your e-visit answers were reviewed by a board certified advanced clinical practitioner to complete your personal care plan. Depending upon the condition, your plan could have included both over the counter or prescription medications.  Please review your pharmacy choice. Make sure the pharmacy is open so you can pick up prescription now. If there is a problem, you may contact your provider through [employer] and have the prescription routed to another pharmacy.  Your safety is important to [place]. If you have drug allergies check your prescription carefully.   For the next 24 hours you can use MyChart to ask questions about today's visit, request a non-urgent call back, or ask for a work or school excuse. You will get an email in the next two days asking about your experience. I hope that your e-visit has been valuable and will speed your recovery.  Meds ordered this encounter  Medications   valACYclovir (VALTREX) 500 MG tablet     Sig: Take 1 tablet (500 mg total) by mouth 2 (two) times daily for 3 days.    Dispense:  6 tablet    Refill:  0    I spent approximately 5 minutes reviewing the patient's history, current symptoms and coordinating their care today.

## 2022-10-18 ENCOUNTER — Ambulatory Visit: Payer: Medicaid Other | Admitting: Family Medicine

## 2022-10-25 ENCOUNTER — Encounter: Payer: Self-pay | Admitting: Family Medicine

## 2022-10-29 ENCOUNTER — Telehealth: Payer: Medicaid Other | Admitting: Physician Assistant

## 2022-10-29 DIAGNOSIS — B3731 Acute candidiasis of vulva and vagina: Secondary | ICD-10-CM | POA: Diagnosis not present

## 2022-10-29 MED ORDER — FLUCONAZOLE 150 MG PO TABS: 150.0000 mg | ORAL_TABLET | ORAL | 0 refills | Status: DC | PRN

## 2022-10-29 NOTE — Progress Notes (Signed)
E-Visit for Vaginal Symptoms  We are sorry that you are not feeling well. Here is how we plan to help! Based on what you shared with me it looks like you: May have a yeast vaginosis  Vaginosis is an inflammation of the vagina that can result in discharge, itching and pain. The cause is usually a change in the normal balance of vaginal bacteria or an infection. Vaginosis can also result from reduced estrogen levels after menopause.  The most common causes of vaginosis are:   Bacterial vaginosis which results from an overgrowth of one on several organisms that are normally present in your vagina.   Yeast infections which are caused by a naturally occurring fungus called candida.   Vaginal atrophy (atrophic vaginosis) which results from the thinning of the vagina from reduced estrogen levels after menopause.   Trichomoniasis which is caused by a parasite and is commonly transmitted by sexual intercourse.  Factors that increase your risk of developing vaginosis include: Medications, such as antibiotics and steroids Uncontrolled diabetes Use of hygiene products such as bubble bath, vaginal spray or vaginal deodorant Douching Wearing damp or tight-fitting clothing Using an intrauterine device (IUD) for birth control Hormonal changes, such as those associated with pregnancy, birth control pills or menopause Sexual activity Having a sexually transmitted infection  Your treatment plan is Diflucan (fluconazole) 150mg tablet once, may repeat in 72 hours if needed.  I have electronically sent this prescription into the pharmacy that you have chosen.  Be sure to take all of the medication as directed. Stop taking any medication if you develop a rash, tongue swelling or shortness of breath. Mothers who are breast feeding should consider pumping and discarding their breast milk while on these antibiotics. However, there is no consensus that infant exposure at these doses would be harmful.  Remember  that medication creams can weaken latex condoms. .   HOME CARE:  Good hygiene may prevent some types of vaginosis from recurring and may relieve some symptoms:  Avoid baths, hot tubs and whirlpool spas. Rinse soap from your outer genital area after a shower, and dry the area well to prevent irritation. Don't use scented or harsh soaps, such as those with deodorant or antibacterial action. Avoid irritants. These include scented tampons and pads. Wipe from front to back after using the toilet. Doing so avoids spreading fecal bacteria to your vagina.  Other things that may help prevent vaginosis include:  Don't douche. Your vagina doesn't require cleansing other than normal bathing. Repetitive douching disrupts the normal organisms that reside in the vagina and can actually increase your risk of vaginal infection. Douching won't clear up a vaginal infection. Use a latex condom. Both female and female latex condoms may help you avoid infections spread by sexual contact. Wear cotton underwear. Also wear pantyhose with a cotton crotch. If you feel comfortable without it, skip wearing underwear to bed. Yeast thrives in moist environments Your symptoms should improve in the next day or two.  GET HELP RIGHT AWAY IF:  You have pain in your lower abdomen ( pelvic area or over your ovaries) You develop nausea or vomiting You develop a fever Your discharge changes or worsens You have persistent pain with intercourse You develop shortness of breath, a rapid pulse, or you faint.  These symptoms could be signs of problems or infections that need to be evaluated by a medical provider now.  MAKE SURE YOU   Understand these instructions. Will watch your condition. Will get help right   away if you are not doing well or get worse.  Thank you for choosing an e-visit.  Your e-visit answers were reviewed by a board certified advanced clinical practitioner to complete your personal care plan. Depending  upon the condition, your plan could have included both over the counter or prescription medications.  Please review your pharmacy choice. Make sure the pharmacy is open so you can pick up prescription now. If there is a problem, you may contact your provider through MyChart messaging and have the prescription routed to another pharmacy.  Your safety is important to us. If you have drug allergies check your prescription carefully.   For the next 24 hours you can use MyChart to ask questions about today's visit, request a non-urgent call back, or ask for a work or school excuse. You will get an email in the next two days asking about your experience. I hope that your e-visit has been valuable and will speed your recovery.  I have spent 5 minutes in review of e-visit questionnaire, review and updating patient chart, medical decision making and response to patient.   Dewitt Judice M Bethanie Bloxom, PA-C  

## 2022-10-30 ENCOUNTER — Other Ambulatory Visit (HOSPITAL_COMMUNITY)
Admission: RE | Admit: 2022-10-30 | Discharge: 2022-10-30 | Disposition: A | Discharge status: Home / Self Care | Payer: Medicaid Other | Source: Ambulatory Visit | Attending: Student | Admitting: Student

## 2022-10-30 ENCOUNTER — Ambulatory Visit: Payer: Medicaid Other

## 2022-10-30 ENCOUNTER — Ambulatory Visit (INDEPENDENT_AMBULATORY_CARE_PROVIDER_SITE_OTHER): Payer: Medicaid Other

## 2022-10-30 VITALS — BP 97/68 | HR 79 | Wt 222.0 lb

## 2022-10-30 DIAGNOSIS — N898 Other specified noninflammatory disorders of vagina: Secondary | ICD-10-CM | POA: Diagnosis not present

## 2022-10-30 DIAGNOSIS — L290 Pruritus ani: Secondary | ICD-10-CM

## 2022-10-30 NOTE — Progress Notes (Signed)
SUBJECTIVE:  37 y.o. female complains of clear vaginal discharge for 3 week(s). Denies abnormal vaginal bleeding or significant pelvic pain or fever. No UTI symptoms. Denies history of known exposure to STD.  Patient's last menstrual period was 10/16/2022 (exact date).  OBJECTIVE:  She appears well, afebrile. Urine dipstick: not done.  ASSESSMENT:  Vaginal Discharge  Vaginal itching  Anal itching   PLAN:  GC, chlamydia, trichomonas, BVAG, CVAG, and anal probes sent to lab. Treatment: To be determined once lab results are received ROV prn if symptoms persist or worsen.   Christie [name], CMA

## 2022-10-31 LAB — CERVICOVAGINAL ANCILLARY ONLY
Bacterial Vaginitis (gardnerella): NEGATIVE
Bacterial Vaginitis (gardnerella): NEGATIVE
Candida Glabrata: NEGATIVE
Candida Glabrata: NEGATIVE
Candida Vaginitis: NEGATIVE
Candida Vaginitis: NEGATIVE
Chlamydia: NEGATIVE
Chlamydia: NEGATIVE
Comment: NEGATIVE
Comment: NEGATIVE
Comment: NEGATIVE
Comment: NEGATIVE
Comment: NEGATIVE
Comment: NEGATIVE
Comment: NEGATIVE
Comment: NEGATIVE
Comment: NEGATIVE
Comment: NEGATIVE
Comment: NORMAL
Comment: NORMAL
Neisseria Gonorrhea: NEGATIVE
Neisseria Gonorrhea: NEGATIVE
Trichomonas: NEGATIVE
Trichomonas: NEGATIVE

## 2022-11-01 ENCOUNTER — Encounter: Payer: Self-pay | Admitting: Family Medicine

## 2022-11-01 ENCOUNTER — Ambulatory Visit (INDEPENDENT_AMBULATORY_CARE_PROVIDER_SITE_OTHER): Payer: Medicaid Other | Admitting: Family Medicine

## 2022-11-01 ENCOUNTER — Other Ambulatory Visit (HOSPITAL_COMMUNITY)
Admission: RE | Admit: 2022-11-01 | Discharge: 2022-11-01 | Disposition: A | Discharge status: Home / Self Care | Payer: Medicaid Other | Source: Ambulatory Visit | Attending: Family Medicine | Admitting: Family Medicine

## 2022-11-01 ENCOUNTER — Encounter: Payer: Self-pay | Admitting: Nurse Practitioner

## 2022-11-01 VITALS — BP 111/73 | HR 70 | Ht 69.0 in | Wt 223.0 lb

## 2022-11-01 DIAGNOSIS — N898 Other specified noninflammatory disorders of vagina: Secondary | ICD-10-CM | POA: Insufficient documentation

## 2022-11-01 DIAGNOSIS — Z1339 Encounter for screening examination for other mental health and behavioral disorders: Secondary | ICD-10-CM

## 2022-11-01 DIAGNOSIS — L290 Pruritus ani: Secondary | ICD-10-CM | POA: Diagnosis not present

## 2022-11-01 DIAGNOSIS — Z01419 Encounter for gynecological examination (general) (routine) without abnormal findings: Secondary | ICD-10-CM | POA: Diagnosis not present

## 2022-11-01 NOTE — Progress Notes (Addendum)
ANNUAL EXAM Patient name: Christie [name] MRN [id number]  Date of birth: [birth year] Chief Complaint:   Annual Exam  History of Present Illness:   Christie [name] is a 37 y.o.  [phone]  female  being seen today for a routine annual exam.  Current complaints: vaginal discharge. Has anal itching for the past few weeks that hasn't improved. Has used hydrocortisone cream without improvement. Did vaginal cultures which were normal, but wants to repeat them.  Menses 7-9 days long with the majority of days being heavy.  Patient's last menstrual period was 10/16/2022 (exact date).    Last pap 2019. Results were:  normal . 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 Negative margins, normal PAP afterwards Last mammogram: n/a. Results were: normal.  Last colonoscopy: none     11/01/2022    9:37 AM 09/28/2022    3:38 PM 06/14/2022   10:35 AM 06/14/2022   10:34 AM 04/26/2022    3:29 PM  Depression screen PHQ 2/9  Decreased Interest 3 2 0 0 0  Down, Depressed, Hopeless 2 2 0 0 0  PHQ - 2 Score 5 4 0 0 0  Altered sleeping 3 3 0    Tired, decreased energy 3 1 0    Change in appetite 3 2 0    Feeling bad or failure about yourself  3 1 0    Trouble concentrating 2 1 0    Moving slowly or fidgety/restless 0 0 0    Suicidal thoughts 0 0 0    PHQ-9 Score 19 12 0    Difficult doing work/chores  Somewhat difficult Not difficult at all          11/01/2022    9:38 AM 09/28/2022    3:39 PM 11/24/2021    3:59 PM 06/17/2019   10:47 AM  GAD 7 : Generalized Anxiety Score  Nervous, Anxious, on Edge 0  Control/stop worrying 0  Worry too much - different things 0  Trouble relaxing 0  Restless 2 0 1 0  Easily annoyed or irritable 0  Afraid - awful might happen 2 0 2 0  Total GAD 7 Score 0  Anxiety Difficulty  Somewhat difficult Very difficult Not difficult at all     Review of Systems:   Pertinent items are noted in HPI Denies any headaches,  blurred vision, fatigue, shortness of breath, chest pain, abdominal pain, abnormal vaginal discharge/itching/odor/irritation, problems with periods, bowel movements, urination, or intercourse unless otherwise stated above. Pertinent History Reviewed:  Reviewed past medical,surgical, social and family history.  Reviewed problem list, medications and allergies. Physical Assessment:   Vitals:   11/01/22 0935  BP: 111/73  Pulse: 70  Weight: 223 lb (101.2 kg)  Height:  (1.753 m)  Body mass index is 32.93 kg/m.        Physical Examination:   General appearance - well appearing, and in no distress  Mental status - alert, oriented to person, place, and time  Psych:  She has a normal mood and affect  Skin - warm and dry, normal color, no suspicious lesions noted  Chest - effort normal, all lung fields clear to auscultation bilaterally  Heart - normal rate and regular rhythm  Neck:  midline trachea, no thyromegaly or nodules  Breasts - breasts appear normal, no suspicious masses, no skin or nipple changes or axillary nodes  Abdomen - soft, nontender, nondistended, no masses or organomegaly  Pelvic - VULVA: normal appearing vulva with no masses, tenderness or lesions  VAGINA: normal appearing vagina with normal color and discharge, no lesions  CERVIX: normal appearing cervix without discharge or lesions, no CMT  Thin prep pap is done with HR HPV cotesting  UTERUS: uterus is felt to be normal size, shape, consistency and nontender   ADNEXA: No adnexal masses or tenderness noted.  Extremities:  No swelling or varicosities noted  Chaperone present for exam  Assessment & Plan:  1. Well woman exam with routine gynecological exam - Cytology - PAP( [place])  2. Itching in the vaginal area - Cervicovaginal ancillary only( [place])  3. Anal itching Recommended using preparation H to see if that will help.  Referral to GI made. - Ambulatory referral to  Gastroenterology   Labs/procedures today:   Orders Placed This Encounter  Procedures   Ambulatory referral to Gastroenterology    Meds: No orders of the defined types were placed in this encounter.   Follow-up: No follow-ups on file.  Levie Heritage, DO 11/01/2022 10:25 AM

## 2022-11-02 LAB — CERVICOVAGINAL ANCILLARY ONLY
Bacterial Vaginitis (gardnerella): NEGATIVE
Candida Glabrata: NEGATIVE
Candida Vaginitis: NEGATIVE
Chlamydia: NEGATIVE
Comment: NEGATIVE
Comment: NEGATIVE
Comment: NEGATIVE
Comment: NEGATIVE
Comment: NEGATIVE
Comment: NORMAL
Neisseria Gonorrhea: NEGATIVE
Trichomonas: NEGATIVE

## 2022-11-03 ENCOUNTER — Telehealth: Payer: Medicaid Other | Admitting: Family Medicine

## 2022-11-03 DIAGNOSIS — K649 Unspecified hemorrhoids: Secondary | ICD-10-CM

## 2022-11-03 MED ORDER — HYDROCORTISONE ACETATE 25 MG RE SUPP: 25.0000 mg | Freq: Two times a day (BID) | RECTAL | 0 refills | Status: DC

## 2022-11-03 NOTE — Progress Notes (Signed)

## 2022-11-05 LAB — CYTOLOGY - PAP
Adequacy: ABSENT
Comment: NEGATIVE
Diagnosis: NEGATIVE
High risk HPV: NEGATIVE

## 2022-12-11 ENCOUNTER — Ambulatory Visit: Payer: Medicaid Other | Admitting: Family

## 2022-12-17 ENCOUNTER — Telehealth: Payer: Medicaid Other | Admitting: Physician Assistant

## 2022-12-17 DIAGNOSIS — B3731 Acute candidiasis of vulva and vagina: Secondary | ICD-10-CM

## 2022-12-17 MED ORDER — FLUCONAZOLE 150 MG PO TABS: 150.0000 mg | ORAL_TABLET | ORAL | 0 refills | Status: DC | PRN

## 2022-12-17 NOTE — Progress Notes (Signed)

## 2023-01-02 ENCOUNTER — Telehealth: Payer: Self-pay | Admitting: Nurse Practitioner

## 2023-01-02 NOTE — Telephone Encounter (Signed)
Good Afternoon Christie [name],  Patient called stating she needed to reschedule her appointment with you tomorrow at 8:30 due to transportation issues.  Patient was rescheduled for 9/9 at 2:30 with [facility]

## 2023-01-02 NOTE — Progress Notes (Deleted)
01/02/2023 Christie [name] [id number] 06-22-1986   CHIEF COMPLAINT:   HISTORY OF PRESENT ILLNESS: Christie [name] is a 37 year old female with a past medical history of anxiety, depression, bipolar disorder, diabetes mellitus type 1, alpha thalassemia trait and hemorrhoids. Past C-section. She presents to our office today as referred by Dr. Candelaria Celeste for further evaluation regarding hemorrhoids.     Latest Ref Rng & Units 09/04/2022    4:47 PM 12/04/2021   12:32 PM 11/12/2021    9:47 PM  CBC  WBC 4.0 - 10.5 K/uL 6.1  7.5  6.2   Hemoglobin 12.0 - 15.0 g/dL 40.9  81.1  9.7   Hematocrit 36.0 - 46.0 % 33.1  34.1  31.7   Platelets 150 - 400 K/uL 247  276  255         Latest Ref Rng & Units 09/04/2022    4:47 PM 12/04/2021   12:32 PM 11/12/2021    9:47 PM  CMP  Glucose 70 - 99 mg/dL 914  782  956   BUN 6 - 20 mg/dL 12  14  8    Creatinine 0.44 - 1.00 mg/dL 2.13  0.86  5.78   Sodium 135 - 145 mmol/L 135  133  135   Potassium 3.5 - 5.1 mmol/L 3.3  3.7  3.7   Chloride 98 - 111 mmol/L 100  100  101   CO2 22 - 32 mmol/L 28  24  27    Calcium 8.9 - 10.3 mg/dL 9.4  9.2  9.2   Total Protein 6.5 - 8.1 g/dL 7.5  7.3  6.4   Total Bilirubin 0.3 - 1.2 mg/dL 0.4  0.3  0.6   Alkaline Phos 38 - 126 U/L 46  59  69   AST 15 - 41 U/L 14  17  19    ALT 0 - 44 U/L 9  12  17        Past Medical History:  Diagnosis Date   Abnormal Pap smear    colpo scheduled   Alpha thalassemia trait    Anemia    Anxiety    Asthma    Bipolar 1 disorder (HCC)    Chlamydia    Depression    Diabetes mellitus    type 1 on insulin   Fx ankle    history of left ankle fx at age 14   Headache(784.0)    Hx of migraines    Pregnancy induced hypertension    Urinary tract infection    Past Surgical History:  Procedure Laterality Date   CESAREAN SECTION Bilateral 08/21/2018   Procedure: CESAREAN SECTION WITH BILATERAL TUBAL LIGATION;  Surgeon: Conan Bowens, MD;  [facility];  Service:  Obstetrics;  Laterality: Bilateral;   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   TUBAL LIGATION     WISDOM TOOTH EXTRACTION     Social History:  Family History:    reports that she has never smoked. She has never been exposed to tobacco smoke. She has never used smokeless tobacco. She reports current alcohol use. She reports that she does not use drugs. family history includes Asthma in her father and mother; Cancer in her maternal grandmother; Diabetes in her maternal grandmother and mother; Hypertension in her mother and sister; Obesity in an other family member; Sleep apnea in an other family member. Allergies  Allergen Reactions   Banana Anaphylaxis   Adhesive [Tape] Rash  Citrus Rash and Other (See Comments)    Fresh only - Gums bleed   Tapentadol Rash      Outpatient Encounter Medications as of 01/03/2023  Medication Sig   albuterol (VENTOLIN HFA) 108 (90 Base) MCG/ACT inhaler Inhale 1-2 puffs into the lungs every 6 (six) hours as needed for wheezing or shortness of breath.   aspirin-acetaminophen-caffeine (EXCEDRIN MIGRAINE) 250-250-65 MG tablet Take 2 tablets by mouth every 6 (six) hours as needed for headache or migraine.   Continuous Blood Gluc Receiver (DEXCOM G6 RECEIVER) DEVI See admin instructions.   Continuous Blood Gluc Sensor (DEXCOM G6 SENSOR) MISC CHANGE SENSOR EVERY 10 DAYS   Continuous Blood Gluc Transmit (DEXCOM G6 TRANSMITTER) MISC AS DIRECTED FOR 90 DAYS   docusate sodium (COLACE) 100 MG capsule Take 1 capsule (100 mg total) by mouth every 12 (twelve) hours.   fluconazole (DIFLUCAN) 150 MG tablet Take 1 tablet (150 mg total) by mouth every 3 (three) days as needed.   hydrocortisone (ANUSOL-HC) 25 MG suppository Place 1 suppository (25 mg total) rectally 2 (two) times daily.   insulin aspart (NOVOLOG) 100 UNIT/ML injection TAKE AFTER MEALS- 200-250 TAKE 4 UNITS, 251-300 TAKE 6 UNITS, 301-350 8 UNITS , 351-400 10 UNITS.   Insulin Disposable Pump (OMNIPOD 5  G6 POD, GEN 5,) MISC    insulin glargine (LANTUS) 100 UNIT/ML injection Inject 40 Units into the skin daily.   Insulin Pen Needle 31G X 8 MM MISC As needed for insulin injections   ondansetron (ZOFRAN-ODT) 8 MG disintegrating tablet Take 1 tablet (8 mg total) by mouth every 8 (eight) hours as needed for nausea or vomiting.   rizatriptan (MAXALT) 10 MG tablet Take 1 tablet (10 mg total) by mouth as needed for migraine. May repeat in 2 hours if needed.  Maximum 2 tablets in 24 hours.   topiramate (TOPAMAX) 25 MG tablet Take 1 tablet (25 mg total) by mouth at bedtime.   traZODone (DESYREL) 50 MG tablet Take 1 tablet (50 mg total) by mouth at bedtime as needed for sleep.   valACYclovir (VALTREX) 500 MG tablet Take 1 tablet (500 mg total) by mouth daily. Can increase to twice a day for 5 days in the event of a recurrence   No facility-administered encounter medications on file as of 01/03/2023.     REVIEW OF SYSTEMS:  Gen: Denies fever, sweats or chills. No weight loss.  CV: Denies chest pain, palpitations or edema. Resp: Denies cough, shortness of breath of hemoptysis.  GI: Denies heartburn, dysphagia, stomach or lower abdominal pain. No diarrhea or constipation.  GU : Denies urinary burning, blood in urine, increased urinary frequency or incontinence. MS: Denies joint pain, muscles aches or weakness. Derm: Denies rash, itchiness, skin lesions or unhealing ulcers. Psych: Denies depression, anxiety, memory loss or confusion. Heme: Denies bruising, easy bleeding. Neuro:  Denies headaches, dizziness or paresthesias. Endo:  Denies any problems with DM, thyroid or adrenal function.  PHYSICAL EXAM: There were no vitals taken for this visit. General: in no acute distress. Head: Normocephalic and atraumatic. Eyes:  Sclerae non-icteric, conjunctive pink. Ears: Normal auditory acuity. Mouth: Dentition intact. No ulcers or lesions.  Neck: Supple, no lymphadenopathy or thyromegaly.  Lungs: Clear  bilaterally to auscultation without wheezes, crackles or rhonchi. Heart: Regular rate and rhythm. No murmur, rub or gallop appreciated.  Abdomen: Soft, nontender, nondistended. No masses. No hepatosplenomegaly. Normoactive bowel sounds x 4 quadrants.  Rectal:  Musculoskeletal: Symmetrical with no gross deformities. Skin: Warm and dry. No rash  or lesions on visible extremities. Extremities: No edema. Neurological: Alert oriented x 4, no focal deficits.  Psychological:  Alert and cooperative. Normal mood and affect.  ASSESSMENT AND PLAN:    CC:  Levie Heritage, DO

## 2023-01-03 ENCOUNTER — Ambulatory Visit: Payer: Medicaid Other | Admitting: Nurse Practitioner

## 2023-01-10 ENCOUNTER — Other Ambulatory Visit: Payer: Self-pay | Admitting: Neurology

## 2023-01-23 ENCOUNTER — Other Ambulatory Visit: Payer: Self-pay | Admitting: Neurology

## 2023-01-28 DIAGNOSIS — F411 Generalized anxiety disorder: Secondary | ICD-10-CM | POA: Diagnosis not present

## 2023-02-11 DIAGNOSIS — F411 Generalized anxiety disorder: Secondary | ICD-10-CM | POA: Diagnosis not present

## 2023-02-25 ENCOUNTER — Telehealth: Payer: Medicaid Other | Admitting: Family Medicine

## 2023-02-25 DIAGNOSIS — B3731 Acute candidiasis of vulva and vagina: Secondary | ICD-10-CM

## 2023-02-25 MED ORDER — FLUCONAZOLE 150 MG PO TABS
150.0000 mg | ORAL_TABLET | ORAL | 0 refills | Status: DC | PRN
Start: 2023-02-25 — End: 2023-06-13

## 2023-02-25 NOTE — Progress Notes (Signed)

## 2023-03-01 DIAGNOSIS — F411 Generalized anxiety disorder: Secondary | ICD-10-CM | POA: Diagnosis not present

## 2023-03-10 ENCOUNTER — Telehealth: Payer: Medicaid Other | Admitting: Emergency Medicine

## 2023-03-10 DIAGNOSIS — N898 Other specified noninflammatory disorders of vagina: Secondary | ICD-10-CM

## 2023-03-10 NOTE — Progress Notes (Signed)
Because you were treated for a yeast infection by evisit 2 weeks ago, I feel your condition is more complicated than I can handle by Evisit and warrants further evaluation. I recommend that you be seen for a face to face visit.  Please contact your primary care physician practice to be seen. Alternatively, you can be seen in an urgent care  NOTE: You will NOT be charged for this eVisit.  If you do not have a PCP, [facility] offers a free physician referral service available at [phone]. Our trained staff has the experience, knowledge and resources to put you in touch with a physician who is right for you.     If you are having a true medical emergency please call 911.      For an urgent face to face visit, [facility] has eight urgent care centers for your convenience:   NEW!! [facility] Health Urgent Care Center at [facility] Get Driving Directions [phone] [address] [place], [address]    [facility] Health Urgent Care Center at [facility] Get Driving Directions [phone] [address] [place], [place] [address]   [facility]gent [facility] Center [facility]) Get Driving Directions [phone] [address] [place], [place] [address]  [facility]gent Care Center [facility]) Get Driving Directions [phone] [address] [place],  [place]  [address]  [facility] at [employer]  [phone] [address] [place],  [place] [address]   [facility] Health Urgent Care at [facility] Get Driving Directions [phone] [address] [place], [place] [address]   [facility] Health Urgent Care at [facility] Get Driving Directions  [phone] [address] [place], [place] [address]   [facility] Health Urgent Care at [facility] Directions [phone] [address], [address] [place],  [place] [address]  Your MyChart E-visit questionnaire answers were reviewed by a board certified advanced clinical practitioner to complete your personal care plan based on your specific symptoms.  Thank you for using e-Visits.

## 2023-03-18 ENCOUNTER — Ambulatory Visit: Payer: Medicaid Other | Admitting: Physician Assistant

## 2023-04-01 ENCOUNTER — Telehealth: Payer: Self-pay

## 2023-04-01 DIAGNOSIS — F411 Generalized anxiety disorder: Secondary | ICD-10-CM | POA: Diagnosis not present

## 2023-04-01 NOTE — Telephone Encounter (Signed)
Medicaid Managed Care   Unsuccessful Outreach Note  04/01/2023 Name: Christie [name] MRN: [id number] DOB: 26-May-1986  Referred by: Olive Bass, FNP Reason for referral : No chief complaint on file.   An unsuccessful telephone outreach was attempted today. The patient was referred to the case management team for assistance with care management and care coordination.   Follow Up Plan: If patient returns call to provider office, please advise to call Embedded Care Management Care Guide Nicholes Rough* at [phone]*  Nicholes Rough, CMA Care Guide [employer]

## 2023-04-02 DIAGNOSIS — Z113 Encounter for screening for infections with a predominantly sexual mode of transmission: Secondary | ICD-10-CM | POA: Diagnosis not present

## 2023-04-02 DIAGNOSIS — N760 Acute vaginitis: Secondary | ICD-10-CM | POA: Diagnosis not present

## 2023-04-03 ENCOUNTER — Encounter: Payer: Self-pay | Admitting: Family

## 2023-04-03 ENCOUNTER — Encounter: Payer: Self-pay | Admitting: Family Medicine

## 2023-04-03 MED ORDER — DOXYCYCLINE HYCLATE 100 MG PO CAPS: 200.0000 mg | ORAL_CAPSULE | Freq: Once | ORAL | 0 refills | Status: AC

## 2023-04-09 ENCOUNTER — Other Ambulatory Visit: Payer: Self-pay | Admitting: Family

## 2023-04-09 DIAGNOSIS — E1165 Type 2 diabetes mellitus with hyperglycemia: Secondary | ICD-10-CM

## 2023-04-11 ENCOUNTER — Encounter (HOSPITAL_BASED_OUTPATIENT_CLINIC_OR_DEPARTMENT_OTHER): Payer: Self-pay | Admitting: Emergency Medicine

## 2023-04-11 ENCOUNTER — Emergency Department (HOSPITAL_BASED_OUTPATIENT_CLINIC_OR_DEPARTMENT_OTHER)
Admission: EM | Admit: 2023-04-11 | Discharge: 2023-04-11 | Disposition: A | Discharge status: Home / Self Care | Payer: Medicaid Other | Attending: Emergency Medicine | Admitting: Emergency Medicine

## 2023-04-11 ENCOUNTER — Other Ambulatory Visit: Payer: Self-pay

## 2023-04-11 ENCOUNTER — Emergency Department (HOSPITAL_BASED_OUTPATIENT_CLINIC_OR_DEPARTMENT_OTHER): Payer: Medicaid Other | Admitting: Radiology

## 2023-04-11 DIAGNOSIS — M25531 Pain in right wrist: Secondary | ICD-10-CM | POA: Diagnosis not present

## 2023-04-11 DIAGNOSIS — J45909 Unspecified asthma, uncomplicated: Secondary | ICD-10-CM | POA: Diagnosis not present

## 2023-04-11 DIAGNOSIS — Z794 Long term (current) use of insulin: Secondary | ICD-10-CM | POA: Diagnosis not present

## 2023-04-11 DIAGNOSIS — E119 Type 2 diabetes mellitus without complications: Secondary | ICD-10-CM | POA: Diagnosis not present

## 2023-04-11 NOTE — ED Provider Notes (Signed)
[place] EMERGENCY DEPARTMENT AT [facility] Provider Note   CSN: [id number] Arrival date & time: 04/11/23  4401     History  Chief Complaint  Patient presents with   Wrist Pain    Christie [name] is a 37 y.o. female.   Wrist Pain     Patient has a history of asthma migraines depression diabetes who presents to the ED with complaints of right wrist pain.  Patient states she does not recall any particular falls or injuries.  She has noticed gradual onset of right wrist pain.  It occurs when she is moving her wrist or doing any writing.  Patient states symptoms started a couple days ago.  She does a lot of writing with her right hand.  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  She is not sure if she sprained it or is developing carpal tunnel syndrome.  Home Medications Prior to Admission medications   Medication Sig Start Date End Date Taking? Authorizing Provider  albuterol (VENTOLIN HFA) 108 (90 Base) MCG/ACT inhaler Inhale 1-2 puffs into the lungs every 6 (six) hours as needed for wheezing or shortness of breath. 10/15/22   Olive Bass, FNP  aspirin-acetaminophen-caffeine (EXCEDRIN MIGRAINE) [id number] MG tablet Take 2 tablets by mouth every 6 (six) hours as needed for headache or migraine.    [provider]  Continuous Blood Gluc Receiver (DEXCOM G6 RECEIVER) DEVI See admin instructions. 09/07/20   [provider]  Continuous Blood Gluc Sensor (DEXCOM G6 SENSOR) MISC CHANGE SENSOR EVERY 10 DAYS    [provider]  Continuous Blood Gluc Transmit (DEXCOM G6 TRANSMITTER) MISC AS DIRECTED FOR 90 DAYS 08/19/21   [provider]  docusate sodium (COLACE) 100 MG capsule Take 1 capsule (100 mg total) by mouth every 12 (twelve) hours. 10/05/22   Wallis Bamberg, PA-C  fluconazole (DIFLUCAN) 150 MG tablet Take 1 tablet (150 mg total) by mouth every 3 (three) days as needed. 02/25/23   Freddy Finner, NP   hydrocortisone (ANUSOL-HC) 25 MG suppository Place 1 suppository (25 mg total) rectally 2 (two) times daily. 11/03/22   Delorse Lek, FNP  insulin aspart (NOVOLOG) 100 UNIT/ML injection TAKE AFTER MEALS- 200-250 TAKE 4 UNITS, 251-300 TAKE 6 UNITS, 301-350 8 UNITS , 351-400 10 UNITS. 01/27/20   Cristina Gong, PA-C  Insulin Disposable Pump (OMNIPOD 5 G6 POD, GEN 5,) MISC  02/26/22   [provider]  insulin glargine (LANTUS) 100 UNIT/ML injection Inject 40 Units into the skin daily.    [provider]  Insulin Pen Needle 31G X 8 MM MISC As needed for insulin injections 05/17/15   Judeth Horn, NP  ondansetron (ZOFRAN-ODT) 8 MG disintegrating tablet Take 1 tablet (8 mg total) by mouth every 8 (eight) hours as needed for nausea or vomiting. 11/28/21   Olive Bass, FNP  rizatriptan (MAXALT) 10 MG tablet Take 1 tablet (10 mg total) by mouth as needed for migraine. May repeat in 2 hours if needed.  Maximum 2 tablets in 24 hours. 04/30/22   Everlena Cooper, Adam R, DO  topiramate (TOPAMAX) 25 MG tablet TAKE 1 TABLET BY MOUTH EVERYDAY AT BEDTIME 01/23/23   Everlena Cooper, Adam R, DO  traZODone (DESYREL) 50 MG tablet Take 1 tablet (50 mg total) by mouth at bedtime as needed for sleep. 11/14/21   Estella Husk, MD  valACYclovir (VALTREX) 500 MG tablet Take 1 tablet (500 mg total) by mouth daily. Can increase to  twice a day for 5 days in the event of a recurrence 10/15/22   Levie Heritage, DO      Allergies    Banana, Adhesive [tape], Citrus, and Tapentadol    Review of Systems   Review of Systems  Physical Exam Updated Vital Signs BP 112/85   Pulse 96   Temp 98.9 F (37.2 C) (Oral)   Resp 16   Wt 104.3 kg   LMP 03/24/2023   SpO2 99%   BMI 33.97 kg/m  Physical Exam Vitals and nursing note reviewed.  Constitutional:      General: She is not in acute distress.    Appearance: She is well-developed.  HENT:     Head: Normocephalic and atraumatic.     Right Ear: External ear  normal.     Left Ear: External ear normal.  Eyes:     General: No scleral icterus.       Right eye: No discharge.        Left eye: No discharge.     Conjunctiva/sclera: Conjunctivae normal.  Neck:     Trachea: No tracheal deviation.  Cardiovascular:     Rate and Rhythm: Normal rate.  Pulmonary:     Effort: Pulmonary effort is normal. No respiratory distress.     Breath sounds: No stridor.  Abdominal:     General: There is no distension.  Musculoskeletal:        General: Tenderness present. No swelling or deformity.     Cervical back: Neck supple.     Comments: Tenderness palpation right wrist area mild pain with range of motion no evidence of swelling no deformity no erythema  Skin:    General: Skin is warm and dry.     Findings: No rash.  Neurological:     Mental Status: She is alert. Mental status is at baseline.     Cranial Nerves: No dysarthria or facial asymmetry.     Motor: No seizure activity.     ED Results / Procedures / Treatments   Labs (all labs ordered are listed, but only abnormal results are displayed) Labs Reviewed - No data to display  EKG None  Radiology DG Wrist Complete Right  Result Date: 04/11/2023 CLINICAL DATA:  Right wrist pain for 3 days without known injury. EXAM: RIGHT WRIST - COMPLETE 3+ VIEW COMPARISON:  None Available. FINDINGS: There is no evidence of fracture or dislocation. There is no evidence of arthropathy or other focal bone abnormality. Soft tissues are unremarkable. IMPRESSION: Negative. Electronically Signed   By: Lupita Raider M.D.   On: 04/11/2023 08:46    Procedures Procedures    Medications Ordered in ED Medications - No data to display  ED Course/ Medical Decision Making/ A&P Clinical Course as of 04/11/23 0914  Thu Apr 11, 2023  0850 Wrist x-ray negative [JK]    Clinical Course User Index [JK] Linwood Dibbles, MD                                 Medical Decision Making Problems Addressed: Acute pain of right  wrist: acute illness or injury that poses a threat to life or bodily functions  Amount and/or Complexity of Data Reviewed Radiology: ordered and independent interpretation performed.   Patient presented to ED for evaluation of breast pain.  No recent falls or injuries.  No evidence of vascular compromise.  No swelling or redness to suggest infection or  acute inflammatory arthropathy.  Possible symptoms may be related to either a tendinitis.  Carpal tunnel is also consideration.  Will have the patient wear a wrist brace.  Over-the-counter medications as needed for pain.  Follow-up with orthopedics as needed.        Final Clinical Impression(s) / ED Diagnoses Final diagnoses:  Acute pain of right wrist    Rx / DC Orders ED Discharge Orders     None         Linwood Dibbles, MD 04/11/23 [phone]

## 2023-04-11 NOTE — Discharge Instructions (Signed)
Take over-the-counter medications as needed for pain.  Wear the brace for the next week to rest your wrist.  Follow-up with an orthopedic doctor to be rechecked if the symptoms do not resolve.

## 2023-04-11 NOTE — ED Triage Notes (Signed)
Pt c/o RT wrist pain with ROM x 2 days. Denies injury, reports does "a lot  of writing"

## 2023-04-15 DIAGNOSIS — F411 Generalized anxiety disorder: Secondary | ICD-10-CM | POA: Diagnosis not present

## 2023-04-17 ENCOUNTER — Ambulatory Visit: Payer: Medicaid Other

## 2023-04-17 ENCOUNTER — Other Ambulatory Visit (HOSPITAL_COMMUNITY)
Admission: RE | Admit: 2023-04-17 | Discharge: 2023-04-17 | Disposition: A | Discharge status: Home / Self Care | Payer: Medicaid Other | Source: Ambulatory Visit | Attending: Medical | Admitting: Medical

## 2023-04-17 VITALS — BP 107/76 | HR 89 | Wt 223.0 lb

## 2023-04-17 DIAGNOSIS — Z113 Encounter for screening for infections with a predominantly sexual mode of transmission: Secondary | ICD-10-CM | POA: Diagnosis not present

## 2023-04-17 DIAGNOSIS — N898 Other specified noninflammatory disorders of vagina: Secondary | ICD-10-CM | POA: Insufficient documentation

## 2023-04-17 DIAGNOSIS — R109 Unspecified abdominal pain: Secondary | ICD-10-CM | POA: Diagnosis not present

## 2023-04-17 NOTE — Progress Notes (Signed)
Patient presents for STD screening. Self swab was sent to the lab. chiquita l wilson, CMA

## 2023-04-19 LAB — CERVICOVAGINAL ANCILLARY ONLY
Bacterial Vaginitis (gardnerella): NEGATIVE
Candida Glabrata: NEGATIVE
Candida Vaginitis: NEGATIVE
Chlamydia: NEGATIVE
Comment: NEGATIVE
Comment: NEGATIVE
Comment: NEGATIVE
Comment: NEGATIVE
Comment: NEGATIVE
Comment: NORMAL
Neisseria Gonorrhea: NEGATIVE
Trichomonas: NEGATIVE

## 2023-04-22 ENCOUNTER — Ambulatory Visit: Payer: Medicaid Other

## 2023-04-24 ENCOUNTER — Ambulatory Visit: Payer: Medicaid Other

## 2023-04-30 ENCOUNTER — Ambulatory Visit (INDEPENDENT_AMBULATORY_CARE_PROVIDER_SITE_OTHER): Payer: Medicaid Other

## 2023-04-30 VITALS — BP 103/68 | HR 81 | Ht 69.0 in | Wt 222.0 lb

## 2023-04-30 DIAGNOSIS — Z113 Encounter for screening for infections with a predominantly sexual mode of transmission: Secondary | ICD-10-CM

## 2023-04-30 NOTE — Progress Notes (Cosign Needed Addendum)
SUBJECTIVE:  37 y.o. female presents to the office today for an serum STI screen. Denies history of known exposure to STD.  Patient's last menstrual period was 04/28/2023 (exact date).  OBJECTIVE:  She appears well, afebrile.   ASSESSMENT:  Serum STI Screen    PLAN:  Labs ordered and lab requisitions handed to patient for patient to report to the lab to have these labs collected. Treatment: To be determined once lab results are received.

## 2023-05-01 LAB — RPR: RPR Ser Ql: NONREACTIVE

## 2023-05-01 LAB — HEPATITIS C ANTIBODY: Hep C Virus Ab: NONREACTIVE

## 2023-05-01 LAB — HIV ANTIBODY (ROUTINE TESTING W REFLEX): HIV Screen 4th Generation wRfx: NONREACTIVE

## 2023-05-01 LAB — HEPATITIS B SURFACE ANTIGEN: Hepatitis B Surface Ag: NEGATIVE

## 2023-05-02 DIAGNOSIS — E669 Obesity, unspecified: Secondary | ICD-10-CM | POA: Diagnosis not present

## 2023-05-02 DIAGNOSIS — Z794 Long term (current) use of insulin: Secondary | ICD-10-CM | POA: Diagnosis not present

## 2023-05-02 DIAGNOSIS — K900 Celiac disease: Secondary | ICD-10-CM | POA: Diagnosis not present

## 2023-05-02 DIAGNOSIS — E1065 Type 1 diabetes mellitus with hyperglycemia: Secondary | ICD-10-CM | POA: Diagnosis not present

## 2023-05-18 ENCOUNTER — Encounter: Payer: Self-pay | Admitting: Family Medicine

## 2023-05-20 DIAGNOSIS — N898 Other specified noninflammatory disorders of vagina: Secondary | ICD-10-CM | POA: Diagnosis not present

## 2023-05-20 DIAGNOSIS — Z8639 Personal history of other endocrine, nutritional and metabolic disease: Secondary | ICD-10-CM | POA: Diagnosis not present

## 2023-05-29 ENCOUNTER — Encounter: Payer: Self-pay | Admitting: Family Medicine

## 2023-05-29 ENCOUNTER — Telehealth: Payer: Self-pay | Admitting: Family

## 2023-05-29 ENCOUNTER — Encounter: Payer: Self-pay | Admitting: Family

## 2023-05-29 NOTE — Telephone Encounter (Signed)
Called pt and left a VM advising pt she should follow up with her GYN to inform they of her symptoms and if pt is feeling weak or having severe abdominal pain to head to the ER. Advised pt if she has any further questions or concerns to please feel free to give the office a call back.

## 2023-05-29 NOTE — Telephone Encounter (Signed)
In regards to her last Mychart message, please make sure she understands she needs to reach out to her GYN for further evaluation and discussion. If she is feeling weak/ having severe abdominal pain, she needs to go to ER for further evaluation.

## 2023-05-30 NOTE — Telephone Encounter (Signed)
Pt has appointment scheduled with GYN 06/03/2023.

## 2023-06-03 ENCOUNTER — Ambulatory Visit: Payer: Medicaid Other | Admitting: Obstetrics and Gynecology

## 2023-06-12 ENCOUNTER — Ambulatory Visit: Payer: Medicaid Other

## 2023-06-12 ENCOUNTER — Other Ambulatory Visit (HOSPITAL_COMMUNITY)
Admission: RE | Admit: 2023-06-12 | Discharge: 2023-06-12 | Disposition: A | Discharge status: Home / Self Care | Payer: Medicaid Other | Source: Ambulatory Visit | Attending: Family Medicine | Admitting: Family Medicine

## 2023-06-12 VITALS — BP 115/79 | HR 67 | Wt 215.0 lb

## 2023-06-12 DIAGNOSIS — N898 Other specified noninflammatory disorders of vagina: Secondary | ICD-10-CM | POA: Diagnosis not present

## 2023-06-12 NOTE — Progress Notes (Signed)
SUBJECTIVE:  37 y.o. female complains of grey vaginal discharge for 2 day(s). Denies abnormal vaginal bleeding or significant pelvic pain or fever. No UTI symptoms. Denies history of known exposure to STD.  Patient's last menstrual period was 05/27/2023 (approximate).  OBJECTIVE:  She appears well, afebrile. Urine dipstick: not done.  ASSESSMENT:  Vaginal Discharge  Vaginal Odor   PLAN:  GC, chlamydia, trichomonas, BVAG, CVAG probe sent to lab. Treatment: To be determined once lab results are received ROV prn if symptoms persist or worsen.   Gavan Nordby l Colbin Jovel, CMA

## 2023-06-13 ENCOUNTER — Other Ambulatory Visit: Payer: Self-pay

## 2023-06-13 ENCOUNTER — Emergency Department (HOSPITAL_BASED_OUTPATIENT_CLINIC_OR_DEPARTMENT_OTHER)
Admission: EM | Admit: 2023-06-13 | Discharge: 2023-06-13 | Disposition: A | Discharge status: Home / Self Care | Payer: Medicaid Other

## 2023-06-13 DIAGNOSIS — T7421XA Adult sexual abuse, confirmed, initial encounter: Secondary | ICD-10-CM | POA: Insufficient documentation

## 2023-06-13 MED ORDER — FLUCONAZOLE 150 MG PO TABS: 150.0000 mg | ORAL_TABLET | Freq: Every day | ORAL | 1 refills | Status: DC

## 2023-06-13 MED ORDER — LIDOCAINE HCL (PF) 1 % IJ SOLN
1.0000 mL | Freq: Once | INTRAMUSCULAR | Status: AC
  Administered 2023-06-13: 1 mL
  Filled 2023-06-13: qty 5

## 2023-06-13 MED ORDER — AZITHROMYCIN 250 MG PO TABS
1000.0000 mg | ORAL_TABLET | Freq: Once | ORAL | Status: AC
  Administered 2023-06-13: 1000 mg via ORAL
  Filled 2023-06-13: qty 4

## 2023-06-13 MED ORDER — METRONIDAZOLE 500 MG PO TABS
2000.0000 mg | ORAL_TABLET | Freq: Once | ORAL | Status: AC
  Administered 2023-06-13: 2000 mg via ORAL
  Filled 2023-06-13: qty 4

## 2023-06-13 MED ORDER — CEFTRIAXONE SODIUM 500 MG IJ SOLR
500.0000 mg | Freq: Once | INTRAMUSCULAR | Status: AC
  Administered 2023-06-13: 500 mg via INTRAMUSCULAR
  Filled 2023-06-13: qty 500
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
## 2023-06-13 NOTE — ED Notes (Signed)
D/c paperwork reviewed with pt, including prescriptions and follow up care.  No questions or concerns voiced at time of d/c. . Pt verbalized understanding, Ambulatory without assistance to ED exit, NAD.   
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
## 2023-06-13 NOTE — ED Notes (Signed)
The SANE/FNE Teacher, music) consult has been completed. The primary RN and/or provider have been notified. Please contact the SANE/FNE nurse on call (listed in Amion) with any further concerns.  Spoke with charge nurse Drinda Butts B who is relaying patient planning and information to primary nurse Cari, RN.

## 2023-06-14 LAB — CERVICOVAGINAL ANCILLARY ONLY
Bacterial Vaginitis (gardnerella): POSITIVE — AB
Candida Glabrata: NEGATIVE
Candida Vaginitis: NEGATIVE
Chlamydia: NEGATIVE
Comment: NEGATIVE
Comment: NEGATIVE
Comment: NEGATIVE
Comment: NEGATIVE
Comment: NEGATIVE
Comment: NORMAL
Neisseria Gonorrhea: NEGATIVE
Trichomonas: NEGATIVE

## 2023-06-17 MED ORDER — METRONIDAZOLE 1.3 % VA GEL: 1.0000 | Freq: Every day | VAGINAL | 0 refills | Status: AC

## 2023-06-17 NOTE — Addendum Note (Signed)
Addended by: Levie Heritage on: 06/17/2023 11:46 AM   Modules accepted: Orders
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
## 2023-06-19 ENCOUNTER — Encounter: Payer: Self-pay | Admitting: Family

## 2023-07-08 ENCOUNTER — Telehealth: Payer: Self-pay

## 2023-07-08 DIAGNOSIS — Z113 Encounter for screening for infections with a predominantly sexual mode of transmission: Secondary | ICD-10-CM | POA: Diagnosis not present

## 2023-07-08 NOTE — Telephone Encounter (Signed)
Copied from CRM [id number]. Topic: Appointments - Appointment Scheduling >> Jul 08, 2023 10:30 AM Tiffany H wrote: Patient called to ask for an STD screening due to 06/13/23 hospital visit. Please assist.

## 2023-07-09 ENCOUNTER — Ambulatory Visit: Payer: Medicaid Other | Admitting: Family

## 2023-07-09 VITALS — BP 126/82 | Ht 69.0 in | Wt 212.0 lb

## 2023-07-09 DIAGNOSIS — Z113 Encounter for screening for infections with a predominantly sexual mode of transmission: Secondary | ICD-10-CM | POA: Diagnosis not present
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
## 2023-07-11 LAB — RPR: RPR Ser Ql: NONREACTIVE

## 2023-07-11 LAB — HIV ANTIBODY (ROUTINE TESTING W REFLEX): HIV 1&2 Ab, 4th Generation: NONREACTIVE

## 2023-07-19 ENCOUNTER — Encounter: Payer: Self-pay | Admitting: Obstetrics and Gynecology

## 2023-07-19 ENCOUNTER — Ambulatory Visit: Payer: Medicaid Other | Admitting: Obstetrics and Gynecology

## 2023-07-19 VITALS — BP 132/79 | HR 67 | Ht 69.0 in | Wt 215.0 lb

## 2023-07-19 DIAGNOSIS — N939 Abnormal uterine and vaginal bleeding, unspecified: Secondary | ICD-10-CM | POA: Diagnosis not present

## 2023-07-19 MED ORDER — NORETHINDRONE ACETATE 5 MG PO TABS: 5.0000 mg | ORAL_TABLET | Freq: Every day | ORAL | 2 refills | Status: DC
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
## 2023-07-30 ENCOUNTER — Telehealth: Payer: Medicaid Other | Admitting: Physician Assistant

## 2023-07-30 DIAGNOSIS — B3731 Acute candidiasis of vulva and vagina: Secondary | ICD-10-CM | POA: Diagnosis not present

## 2023-07-30 MED ORDER — FLUCONAZOLE 150 MG PO TABS: 150.0000 mg | ORAL_TABLET | ORAL | 0 refills | Status: AC

## 2023-07-30 NOTE — Progress Notes (Signed)

## 2023-07-30 NOTE — Progress Notes (Signed)
I have spent 5 minutes in review of e-visit questionnaire, review and updating patient chart, medical decision making and response to patient.   Mia Milan Cody Jacklynn Dehaas, PA-C    

## 2023-08-08 DIAGNOSIS — E669 Obesity, unspecified: Secondary | ICD-10-CM | POA: Diagnosis not present

## 2023-08-08 DIAGNOSIS — Z794 Long term (current) use of insulin: Secondary | ICD-10-CM | POA: Diagnosis not present

## 2023-08-08 DIAGNOSIS — K900 Celiac disease: Secondary | ICD-10-CM | POA: Diagnosis not present

## 2023-08-08 DIAGNOSIS — E1065 Type 1 diabetes mellitus with hyperglycemia: Secondary | ICD-10-CM | POA: Diagnosis not present

## 2023-08-08 LAB — TSH: TSH: 2.79 (ref 0.41–5.90)

## 2023-08-08 LAB — LIPID PANEL
Cholesterol: 180 (ref 0–200)
HDL: 45 (ref 35–70)
LDL Cholesterol: 126
Triglycerides: 48 (ref 40–160)

## 2023-08-08 LAB — HEPATIC FUNCTION PANEL
ALT: 9 U/L (ref 7–35)
AST: 17 (ref 13–35)
Alkaline Phosphatase: 43 (ref 25–125)
Bilirubin, Direct: 0.1 (ref 0.01–0.4)
Bilirubin, Total: 0.4

## 2023-08-08 LAB — PROTEIN / CREATININE RATIO, URINE: Creatinine, Urine: 247

## 2023-08-08 LAB — COMPREHENSIVE METABOLIC PANEL WITH GFR
Albumin: 4.2 (ref 3.5–5.0)
eGFR: 79

## 2023-08-08 LAB — MICROALBUMIN / CREATININE URINE RATIO: Microalb Creat Ratio: 1.26

## 2023-08-08 LAB — BASIC METABOLIC PANEL WITH GFR
Creatinine: 1 (ref 0.5–1.1)
Potassium: 4 meq/L (ref 3.5–5.1)

## 2023-08-13 ENCOUNTER — Encounter: Payer: Self-pay | Admitting: Obstetrics and Gynecology

## 2023-08-13 ENCOUNTER — Other Ambulatory Visit: Payer: Self-pay | Admitting: Obstetrics and Gynecology

## 2023-08-13 DIAGNOSIS — N939 Abnormal uterine and vaginal bleeding, unspecified: Secondary | ICD-10-CM

## 2023-08-13 MED ORDER — NORETHINDRONE ACETATE 5 MG PO TABS: 7.5000 mg | ORAL_TABLET | Freq: Every day | ORAL | 2 refills | Status: DC

## 2023-08-23 ENCOUNTER — Other Ambulatory Visit: Payer: Self-pay | Admitting: Obstetrics and Gynecology

## 2023-08-23 DIAGNOSIS — N939 Abnormal uterine and vaginal bleeding, unspecified: Secondary | ICD-10-CM

## 2023-08-23 MED ORDER — NORETHINDRONE ACETATE 5 MG PO TABS: 15.0000 mg | ORAL_TABLET | Freq: Every day | ORAL | 2 refills | Status: DC

## 2023-09-09 ENCOUNTER — Telehealth: Payer: Medicaid Other | Admitting: Obstetrics and Gynecology

## 2023-09-09 DIAGNOSIS — N939 Abnormal uterine and vaginal bleeding, unspecified: Secondary | ICD-10-CM

## 2023-09-09 MED ORDER — TRANEXAMIC ACID 650 MG PO TABS: 1300.0000 mg | ORAL_TABLET | Freq: Three times a day (TID) | ORAL | 6 refills | Status: DC

## 2023-09-09 NOTE — Progress Notes (Signed)
 GYNECOLOGY VIRTUAL VISIT ENCOUNTER NOTE  Provider location: Center for [facility]   Patient location: Home  I connected with [name] on 09/09/23 at  9:55 AM EST by MyChart Video Encounter and verified that I am speaking with the correct person using two identifiers.   I discussed the limitations, risks, security and privacy concerns of performing an evaluation and management service virtually and the availability of in person appointments. I also discussed with the patient that there may be a patient responsible charge related to this service. The patient expressed understanding and agreed to proceed.   History:  LODEMA PARMA is a 38 y.o. [phone] female being evaluated today for AUB follow up. Has been on aygestin and then stopped about 1 week. Blood sugars were higher on the aygestin and hoping that they will improve now that she has stopped taking it.   Mom had uterine cancer and was told that it was hereditary, not sure if diagnosed with Lynch syndrome but can find out more. [place] Spaniel it was the particular type of cancer that made it hereditary and grandmother had the saem type of cancer in her uterus as well as other cancers.    Would prefer wtihout cervical block for endometrial biopsy.   Previously seen 07/19/2023 for AUB, started on norethindrone with steady increase in doseage from 5mg  to 15, noted MyChart message 2/22 stopped taking the medication      Past Medical History:  Diagnosis Date   Abnormal Pap smear    colpo scheduled   Alpha thalassemia trait    Anemia    Anxiety    Asthma    Bipolar 1 disorder (HCC)    Chlamydia    Depression    Diabetes mellitus    type 1 on insulin   Fx ankle    history of left ankle fx at age 23   Headache(784.0)    Herpes simplex infection    Hx of migraines    Pregnancy induced hypertension    Urinary tract infection    Past Surgical History:  Procedure Laterality Date   CESAREAN  SECTION Bilateral 08/21/2018   Procedure: CESAREAN SECTION WITH BILATERAL TUBAL LIGATION;  Surgeon: Conan Bowens, MD;  Location: [facility] BIRTHING SUITES;  Service: Obstetrics;  Laterality: Bilateral;   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   Health Maintenance:      Component Value Date/Time   DIAGPAP  11/01/2022 1018    - Negative for intraepithelial lesion or malignancy (NILM)   DIAGPAP  09/23/2017 0000    NEGATIVE FOR INTRAEPITHELIAL LESIONS OR MALIGNANCY.   HPVHIGH Negative 11/01/2022 1018   ADEQPAP  11/01/2022 1018    Satisfactory for evaluation; transformation zone component ABSENT.   ADEQPAP  09/23/2017 0000    Satisfactory for evaluation  endocervical/transformation zone component PRESENT.    High Risk HPV: Positive  Adequacy:  Satisfactory for evaluation, transformation zone component PRESENT  Diagnosis:  Atypical squamous cells of undetermined significance (ASC-US)   Review of Systems:  Pertinent items noted in HPI and remainder of comprehensive ROS otherwise negative.  Physical Exam:   General:  Alert, oriented and cooperative. Patient appears to be  in no acute distress.  Mental Status: Normal mood and affect. Normal behavior. Normal judgment and thought content.   Respiratory: Normal respiratory effort, no problems with respiration noted  Rest of physical exam deferred due to type of encounter  Labs and Imaging Pelvic US 09/2022 FINDINGS: Uterus   Measurements: 11.6 x 5.4 x 6.1 cm = volume: 201 mL. No fibroids or other mass visualized. Retroflexed position.   Endometrium   Thickness: 8.3 mm.  No focal abnormality visualized.   Right ovary   Measurements: 4.0 x 2.5 x 2.5 cm =  volume: 13 mL. 2.2 cm anechoic cyst or dominant follicle noted.   Left ovary   Measurements: 2.6 x 2.1 x 4.6 cm = volume: 13 mL. Normal appearance/no adnexal mass. Only seen transabdominally.   Other findings   No free pelvic fluid. Trace nonspecific fluid within the endocervical canal.   IMPRESSION: 1. 2.2 cm right ovarian simple cyst or dominant follicle. 2. Trace nonspecific fluid within the endocervical canal. 3. Otherwise normal pelvic ultrasound.     Electronically Signed   By: Judie Petit.  Shick M.D.   On: 09/29/2022 17:1   Assessment and Plan:     1. Abnormal uterine bleeding (AUB) (Primary) No fibroids noted on prior pelvic ultrasound. Given reported hereditary nature of mother's uterine cancer and concomitant AUB, recommend endometrial biopsy, will schedule follow up to complete. Has already stopped aygestin, trial of TXA for AUB management. Longstanding DM makes CHC contraindication.  Last labs (08/09/2023 at [facility]): A1c 9.3 , TSH 2.79, Cr 0.96 Will check CBC at follow up    - tranexamic acid (LYSTEDA) 650 MG TABS tablet; Take 2 tablets (1,300 mg total) by mouth 3 (three) times daily. Take during menses for a maximum of five days  Dispense: 30 tablet; Refill: 6       I discussed the assessment and treatment plan with the patient. The patient was provided an opportunity to ask questions and all were answered. The patient agreed with the plan and demonstrated an understanding of the instructions.   The patient was advised to call back or seek an in-person evaluation/go to the ED if the symptoms worsen or if the condition fails to improve as anticipated.  I provided 5 minutes of face-to-face time during this encounter. I also spent 5 minutes dedicated to the care of this patient including pre-visit review of records, post visit ordering of medications and appropriate tests or procedures, coordinating care and documenting this visit encounter.    Lorriane Shire, MD Center  for [employer], [facility]

## 2023-09-28 ENCOUNTER — Telehealth: Admitting: Nurse Practitioner

## 2023-09-28 DIAGNOSIS — B3731 Acute candidiasis of vulva and vagina: Secondary | ICD-10-CM

## 2023-09-28 MED ORDER — FLUCONAZOLE 150 MG PO TABS: 150.0000 mg | ORAL_TABLET | ORAL | 0 refills | Status: DC | PRN

## 2023-09-28 NOTE — Progress Notes (Signed)
 I have spent 5 minutes in review of e-visit questionnaire, review and updating patient chart, medical decision making and response to patient.   Claiborne Rigg, NP

## 2023-09-28 NOTE — Progress Notes (Signed)

## 2023-10-04 ENCOUNTER — Ambulatory Visit: Payer: Medicaid Other | Admitting: Obstetrics and Gynecology

## 2023-10-11 ENCOUNTER — Other Ambulatory Visit (HOSPITAL_COMMUNITY)
Admission: RE | Admit: 2023-10-11 | Discharge: 2023-10-11 | Disposition: A | Discharge status: Home / Self Care | Source: Ambulatory Visit | Attending: Family | Admitting: Family

## 2023-10-11 ENCOUNTER — Encounter: Payer: Self-pay | Admitting: Family

## 2023-10-11 ENCOUNTER — Ambulatory Visit: Admitting: Family

## 2023-10-11 VITALS — BP 118/70 | HR 70 | Ht 69.0 in | Wt 208.0 lb

## 2023-10-11 DIAGNOSIS — N761 Subacute and chronic vaginitis: Secondary | ICD-10-CM | POA: Diagnosis not present

## 2023-10-11 NOTE — Progress Notes (Signed)
 STEELE [name] is a 38 y.o. female with the following history as recorded in EpicCare:  Patient Active Problem List   Diagnosis Date Noted   MDD (major depressive disorder), recurrent severe, without psychosis (HCC) 11/12/2021   Mastalgia 11/06/2018   Bilateral lower extremity edema 08/24/2018   Poorly controlled diabetes mellitus (HCC) 08/18/2018   Anxiety 08/12/2018   Hyperglycemia 08/12/2018   DKA (diabetic ketoacidosis) (HCC) 07/11/2018   Alpha+ thalassemia trait 03/12/2018   History of loop electrical excision procedure (LEEP) 09/23/2017   Back pain 04/27/2016   Abnormal Papanicolaou smear of cervix with positive human papilloma virus (HPV) test 09/02/2015   Adjustment disorder with depressed mood 08/03/2015   Asthma, mild intermittent 07/06/2013   Type I diabetes mellitus with complication, uncontrolled (HCC) 06/18/2011   History of migraine during pregnancy 06/18/2011   Migraine 06/18/2011    Current Outpatient Medications  Medication Sig Dispense Refill   albuterol (VENTOLIN HFA) 108 (90 Base) MCG/ACT inhaler Inhale 1-2 puffs into the lungs every 6 (six) hours as needed for wheezing or shortness of breath. 18 g 5   aspirin-acetaminophen-caffeine (EXCEDRIN MIGRAINE) 250-250-65 MG tablet Take 2 tablets by mouth every 6 (six) hours as needed for headache or migraine.     Continuous Blood Gluc Receiver (DEXCOM G6 RECEIVER) DEVI See admin instructions.     Continuous Blood Gluc Sensor (DEXCOM G6 SENSOR) MISC CHANGE SENSOR EVERY 10 DAYS     Continuous Blood Gluc Transmit (DEXCOM G6 TRANSMITTER) MISC AS DIRECTED FOR 90 DAYS     docusate sodium (COLACE) 100 MG capsule Take 1 capsule (100 mg total) by mouth every 12 (twelve) hours. 60 capsule 0   fluconazole (DIFLUCAN) 150 MG tablet Take 1 tablet (150 mg total) by mouth every three (3) days as needed. 2 tablet 0   insulin aspart (NOVOLOG) 100 UNIT/ML injection TAKE AFTER MEALS- 200-250 TAKE 4 UNITS, 251-300 TAKE 6 UNITS, 301-350 8  UNITS , 351-400 10 UNITS. 20 mL 2   Insulin Disposable Pump (OMNIPOD 5 G6 POD, GEN 5,) MISC      insulin glargine (LANTUS) 100 UNIT/ML injection Inject 40 Units into the skin daily.     Insulin Pen Needle 31G X 8 MM MISC As needed for insulin injections 100 each 1   ondansetron (ZOFRAN-ODT) 8 MG disintegrating tablet Take 1 tablet (8 mg total) by mouth every 8 (eight) hours as needed for nausea or vomiting. 20 tablet 0   rizatriptan (MAXALT) 10 MG tablet Take 1 tablet (10 mg total) by mouth as needed for migraine. May repeat in 2 hours if needed.  Maximum 2 tablets in 24 hours. 10 tablet 5   Semaglutide-Weight Management (WEGOVY) 1.7 MG/0.75ML SOAJ Inject 1.7 mg into the skin.     topiramate (TOPAMAX) 25 MG tablet TAKE 1 TABLET BY MOUTH EVERYDAY AT BEDTIME 90 tablet 0   tranexamic acid (LYSTEDA) 650 MG TABS tablet Take 2 tablets (1,300 mg total) by mouth 3 (three) times daily. Take during menses for a maximum of five days 30 tablet 6   traZODone (DESYREL) 50 MG tablet Take 1 tablet (50 mg total) by mouth at bedtime as needed for sleep. 30 tablet 0   valACYclovir (VALTREX) 500 MG tablet Take 1 tablet (500 mg total) by mouth daily. Can increase to twice a day for 5 days in the event of a recurrence 90 tablet 3   No current facility-administered medications for this visit.    Allergies: Banana, Adhesive [tape], Citrus, and Tapentadol  Past Medical  History:  Diagnosis Date   Abnormal Pap smear    colpo scheduled   Alpha thalassemia trait    Anemia    Anxiety    Asthma    Bipolar 1 disorder (HCC)    Chlamydia    Depression    Diabetes mellitus    type 1 on insulin   Fx ankle    history of left ankle fx at age 12   Headache(784.0)    Herpes simplex infection    Hx of migraines    Pregnancy induced hypertension    Urinary tract infection     Past Surgical History:  Procedure Laterality Date   CESAREAN SECTION Bilateral 08/21/2018   Procedure: CESAREAN SECTION WITH BILATERAL TUBAL  LIGATION;  Surgeon: Conan Bowens, MD;  Location: [facility] BIRTHING SUITES;  Service: Obstetrics;  Laterality: Bilateral;   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   TUBAL LIGATION     WISDOM TOOTH EXTRACTION      Family History  Problem Relation Age of Onset   Asthma Mother    Diabetes Mother    Hypertension Mother    Asthma Father    Hypertension Sister    Diabetes Maternal Grandmother    Cancer Maternal Grandmother        breast   Obesity Other    Sleep apnea Other    Anesthesia problems Neg Hx     Social History   Tobacco Use   Smoking status: Never    Passive exposure: Never   Smokeless tobacco: Never  Substance Use Topics   Alcohol use: Yes    Comment: occ    Subjective:   Patient is concerned about recurrent vaginal infections; is actively working to get her blood sugar under control; does not feel that she has an active infection at this time but would like to see if she can have standing prescription for Diflucan;   Objective:  Vitals:   10/11/23 1503  BP: 118/70  Pulse: 70  SpO2: 100%  Weight: 208 lb (94.3 kg)  Height: 5\' 9"  (1.753 m)    General: Well developed, well nourished, in no acute distress  Skin : Warm and dry.  Head: Normocephalic and atraumatic  Lungs: Respirations unlabored;  Neurologic: Alert and oriented; speech intact; face symmetrical; moves all extremities well; CNII-XII intact without focal deficit   Assessment:  1. Chronic vaginitis     Plan:  Patient is making good improvement in diabetes control which should help limit recurrent vaginal infections; will update Vaginal swab today to make sure no underlying infection other than yeast present; follow up to be determined.   No follow-ups on file.  No orders of the defined types were placed in this encounter.   Requested Prescriptions    No prescriptions requested or ordered in this encounter

## 2023-10-14 ENCOUNTER — Encounter: Payer: Self-pay | Admitting: Family

## 2023-10-14 LAB — CERVICOVAGINAL ANCILLARY ONLY
Bacterial Vaginitis (gardnerella): NEGATIVE
Candida Glabrata: NEGATIVE
Candida Vaginitis: NEGATIVE
Chlamydia: NEGATIVE
Comment: NEGATIVE
Comment: NEGATIVE
Comment: NEGATIVE
Comment: NEGATIVE
Comment: NEGATIVE
Comment: NORMAL
Neisseria Gonorrhea: NEGATIVE
Trichomonas: NEGATIVE

## 2023-10-20 ENCOUNTER — Other Ambulatory Visit: Payer: Self-pay | Admitting: Family

## 2023-10-20 DIAGNOSIS — R0602 Shortness of breath: Secondary | ICD-10-CM

## 2023-11-04 DIAGNOSIS — E1065 Type 1 diabetes mellitus with hyperglycemia: Secondary | ICD-10-CM | POA: Diagnosis not present

## 2023-11-04 DIAGNOSIS — K900 Celiac disease: Secondary | ICD-10-CM | POA: Diagnosis not present

## 2023-11-04 DIAGNOSIS — Z794 Long term (current) use of insulin: Secondary | ICD-10-CM | POA: Diagnosis not present

## 2023-11-04 DIAGNOSIS — E669 Obesity, unspecified: Secondary | ICD-10-CM | POA: Diagnosis not present

## 2023-11-06 ENCOUNTER — Other Ambulatory Visit (HOSPITAL_COMMUNITY)
Admission: RE | Admit: 2023-11-06 | Discharge: 2023-11-06 | Disposition: A | Discharge status: Home / Self Care | Source: Ambulatory Visit | Attending: Family Medicine | Admitting: Family Medicine

## 2023-11-06 ENCOUNTER — Ambulatory Visit

## 2023-11-06 VITALS — BP 124/85 | HR 83 | Wt 209.0 lb

## 2023-11-06 DIAGNOSIS — N898 Other specified noninflammatory disorders of vagina: Secondary | ICD-10-CM

## 2023-11-06 NOTE — Progress Notes (Signed)
 SUBJECTIVE:  38 y.o. female complains of grey vaginal discharge for 2 day(s). Denies abnormal vaginal bleeding or significant pelvic pain or fever. No UTI symptoms. Denies history of known exposure to STD.  Patient's last menstrual period was 10/29/2023 (exact date).  OBJECTIVE:  She appears well, afebrile. Urine dipstick: not done.  ASSESSMENT:  Vaginal Discharge  Vaginal Odor   PLAN:  BVAG, CVAG probe sent to lab. Treatment: To be determined once lab results are received ROV prn if symptoms persist or worsen.  Anetha Slagel l Zen Felling, CMA

## 2023-11-07 ENCOUNTER — Other Ambulatory Visit: Payer: Self-pay

## 2023-11-07 ENCOUNTER — Encounter: Payer: Self-pay | Admitting: Family Medicine

## 2023-11-07 DIAGNOSIS — N760 Acute vaginitis: Secondary | ICD-10-CM

## 2023-11-07 DIAGNOSIS — B379 Candidiasis, unspecified: Secondary | ICD-10-CM

## 2023-11-07 LAB — CERVICOVAGINAL ANCILLARY ONLY
Bacterial Vaginitis (gardnerella): POSITIVE — AB
Candida Glabrata: NEGATIVE
Candida Vaginitis: NEGATIVE
Comment: NEGATIVE
Comment: NEGATIVE
Comment: NEGATIVE

## 2023-11-07 MED ORDER — FLUCONAZOLE 150 MG PO TABS: ORAL_TABLET | ORAL | 0 refills | Status: DC

## 2023-11-07 MED ORDER — METRONIDAZOLE 500 MG PO TABS: 500.0000 mg | ORAL_TABLET | Freq: Two times a day (BID) | ORAL | 0 refills | Status: DC

## 2023-11-07 NOTE — Progress Notes (Signed)
 Patient tested positive for BV.Patient states she always catches yeast infections after taking antibiotics. Flagyl  500 mg take 1 tablet PO BID x 7 days and Diflucan  150 mg take 1 tab PO was sent to her pharmacy. A Mychart message will be sent to the patient. Kamilah Correia l Mckinzee Spirito, CMA

## 2023-11-11 ENCOUNTER — Other Ambulatory Visit: Payer: Self-pay

## 2023-11-18 ENCOUNTER — Other Ambulatory Visit: Payer: Self-pay

## 2023-11-18 DIAGNOSIS — B379 Candidiasis, unspecified: Secondary | ICD-10-CM

## 2023-11-18 MED ORDER — FLUCONAZOLE 150 MG PO TABS
150.0000 mg | ORAL_TABLET | Freq: Once | ORAL | 0 refills | Status: AC
Start: 2023-11-18 — End: 2023-11-18

## 2023-11-18 NOTE — Progress Notes (Signed)
 Patient sent a Mychart requesting a Rx for yeast. Patient states she finished her last dose of Diflucan  but it has not provided any relief. Diflucan  150 mg take 1 tablet PO was sent to her pharmacy. Marvel Mcphillips l Jaycey Gens, CMA

## 2024-01-15 ENCOUNTER — Other Ambulatory Visit: Payer: Self-pay | Admitting: Family Medicine

## 2024-01-15 ENCOUNTER — Encounter: Payer: Self-pay | Admitting: Family Medicine

## 2024-01-15 ENCOUNTER — Other Ambulatory Visit: Payer: Self-pay

## 2024-01-15 MED ORDER — VALACYCLOVIR HCL 500 MG PO TABS: 500.0000 mg | ORAL_TABLET | Freq: Every day | ORAL | 3 refills | Status: DC

## 2024-01-20 ENCOUNTER — Ambulatory Visit

## 2024-01-20 ENCOUNTER — Other Ambulatory Visit (HOSPITAL_COMMUNITY)
Admission: RE | Admit: 2024-01-20 | Discharge: 2024-01-20 | Disposition: A | Discharge status: Home / Self Care | Source: Ambulatory Visit | Attending: Obstetrics and Gynecology | Admitting: Obstetrics and Gynecology

## 2024-01-20 VITALS — BP 111/74 | Temp 81.0°F | Wt 202.0 lb

## 2024-01-20 DIAGNOSIS — N898 Other specified noninflammatory disorders of vagina: Secondary | ICD-10-CM

## 2024-01-20 NOTE — Progress Notes (Signed)
 SUBJECTIVE:  38 y.o. female complains of white vaginal discharge for 2 day(s). Denies abnormal vaginal bleeding or significant pelvic pain or fever. No UTI symptoms. Denies history of known exposure to STD.  Patient's last menstrual period was 12/25/2023 (approximate).  OBJECTIVE:  She appears well, afebrile. Urine dipstick: not done.  ASSESSMENT:  Vaginal Discharge    PLAN:  GC, chlamydia, trichomonas, BVAG, CVAG probe sent to lab. Treatment: To be determined once lab results are received ROV prn if symptoms persist or worsen.  Cecilie Heidel l Harald Quevedo, CMA

## 2024-01-21 ENCOUNTER — Ambulatory Visit: Payer: Self-pay | Admitting: Obstetrics and Gynecology

## 2024-01-21 LAB — CERVICOVAGINAL ANCILLARY ONLY
Bacterial Vaginitis (gardnerella): NEGATIVE
Candida Glabrata: NEGATIVE
Candida Vaginitis: NEGATIVE
Chlamydia: NEGATIVE
Comment: NEGATIVE
Comment: NEGATIVE
Comment: NEGATIVE
Comment: NEGATIVE
Comment: NEGATIVE
Comment: NORMAL
Neisseria Gonorrhea: NEGATIVE
Trichomonas: NEGATIVE

## 2024-01-22 ENCOUNTER — Encounter: Payer: Self-pay | Admitting: Family Medicine

## 2024-01-23 ENCOUNTER — Ambulatory Visit

## 2024-01-23 ENCOUNTER — Other Ambulatory Visit (HOSPITAL_COMMUNITY)
Admission: RE | Admit: 2024-01-23 | Discharge: 2024-01-23 | Disposition: A | Discharge status: Home / Self Care | Source: Ambulatory Visit | Attending: Family Medicine | Admitting: Family Medicine

## 2024-01-23 VITALS — BP 118/76 | HR 82 | Ht 69.0 in | Wt 201.0 lb

## 2024-01-23 DIAGNOSIS — N898 Other specified noninflammatory disorders of vagina: Secondary | ICD-10-CM | POA: Diagnosis present

## 2024-01-23 NOTE — Progress Notes (Signed)
 SUBJECTIVE:  38 y.o. female who desires self swab for vaginal discharge Denies abnormal bleeding or significant pelvic pain. No UTI symptoms. Denies history of known exposure to STD.  Patient's last menstrual period was 12/25/2023 (approximate).  OBJECTIVE:  She appears well.   ASSESSMENT:  Self Swab  PLAN:  GC, chlamydia, and trichomonas probe sent to lab.  Treatment: To be determined once lab results are received.  Pt follow up as needed.

## 2024-01-27 LAB — CERVICOVAGINAL ANCILLARY ONLY
Bacterial Vaginitis (gardnerella): NEGATIVE
Candida Glabrata: NEGATIVE
Candida Vaginitis: NEGATIVE
Chlamydia: NEGATIVE
Comment: NEGATIVE
Comment: NEGATIVE
Comment: NEGATIVE
Comment: NEGATIVE
Comment: NEGATIVE
Comment: NORMAL
Neisseria Gonorrhea: NEGATIVE
Trichomonas: NEGATIVE

## 2024-02-05 DIAGNOSIS — Z794 Long term (current) use of insulin: Secondary | ICD-10-CM | POA: Diagnosis not present

## 2024-02-05 DIAGNOSIS — E669 Obesity, unspecified: Secondary | ICD-10-CM | POA: Diagnosis not present

## 2024-02-05 DIAGNOSIS — K900 Celiac disease: Secondary | ICD-10-CM | POA: Diagnosis not present

## 2024-02-05 DIAGNOSIS — E1065 Type 1 diabetes mellitus with hyperglycemia: Secondary | ICD-10-CM | POA: Diagnosis not present

## 2024-02-05 LAB — HM DIABETES FOOT EXAM: HM Diabetic Foot Exam: NORMAL

## 2024-02-05 LAB — HEMOGLOBIN A1C: Hemoglobin A1C: 7.4

## 2024-02-06 ENCOUNTER — Encounter: Payer: Self-pay | Admitting: Family

## 2024-03-10 ENCOUNTER — Other Ambulatory Visit (HOSPITAL_COMMUNITY)
Admission: RE | Admit: 2024-03-10 | Discharge: 2024-03-10 | Disposition: A | Discharge status: Home / Self Care | Source: Ambulatory Visit | Attending: Family | Admitting: Family

## 2024-03-10 ENCOUNTER — Ambulatory Visit: Admitting: Family

## 2024-03-10 ENCOUNTER — Encounter: Payer: Self-pay | Admitting: Family

## 2024-03-10 VITALS — BP 122/80 | Ht 69.0 in | Wt 196.6 lb

## 2024-03-10 DIAGNOSIS — R350 Frequency of micturition: Secondary | ICD-10-CM

## 2024-03-10 DIAGNOSIS — N760 Acute vaginitis: Secondary | ICD-10-CM | POA: Diagnosis not present

## 2024-03-10 LAB — POCT URINALYSIS DIPSTICK
Bilirubin, UA: NEGATIVE
Blood, UA: NEGATIVE
Glucose, UA: NEGATIVE
Ketones, UA: NEGATIVE
Leukocytes, UA: NEGATIVE
Nitrite, UA: NEGATIVE
Protein, UA: NEGATIVE
Spec Grav, UA: 1.02 (ref 1.010–1.025)
Urobilinogen, UA: 0.2 U/dL
pH, UA: 6 (ref 5.0–8.0)

## 2024-03-10 NOTE — Progress Notes (Signed)
 Christie [name] is a 38 y.o. female with the following history as recorded in EpicCare:  Patient Active Problem List   Diagnosis Date Noted   MDD (major depressive disorder), recurrent severe, without psychosis (HCC) 11/12/2021   Mastalgia 11/06/2018   Bilateral lower extremity edema 08/24/2018   Poorly controlled diabetes mellitus (HCC) 08/18/2018   Anxiety 08/12/2018   Hyperglycemia 08/12/2018   DKA (diabetic ketoacidosis) (HCC) 07/11/2018   Alpha+ thalassemia trait 03/12/2018   History of loop electrical excision procedure (LEEP) 09/23/2017   Back pain 04/27/2016   Abnormal Papanicolaou smear of cervix with positive human papilloma virus (HPV) test 09/02/2015   Adjustment disorder with depressed mood 08/03/2015   Asthma, mild intermittent 07/06/2013   Type I diabetes mellitus with complication, uncontrolled (HCC) 06/18/2011   History of migraine during pregnancy 06/18/2011   Migraine 06/18/2011    Current Outpatient Medications  Medication Sig Dispense Refill   aspirin -acetaminophen -caffeine  (EXCEDRIN MIGRAINE) 250-250-65 MG tablet Take 2 tablets by mouth every 6 (six) hours as needed for headache or migraine.     Continuous Blood Gluc Receiver (DEXCOM G6 RECEIVER) DEVI See admin instructions.     Continuous Blood Gluc Sensor (DEXCOM G6 SENSOR) MISC CHANGE SENSOR EVERY 10 DAYS     Continuous Blood Gluc Transmit (DEXCOM G6 TRANSMITTER) MISC AS DIRECTED FOR 90 DAYS     docusate sodium  (COLACE) 100 MG capsule Take 1 capsule (100 mg total) by mouth every 12 (twelve) hours. 60 capsule 0   insulin  aspart (NOVOLOG ) 100 UNIT/ML injection TAKE AFTER MEALS- 200-250 TAKE 4 UNITS, 251-300 TAKE 6 UNITS, 301-350 8 UNITS , 351-400 10 UNITS. 20 mL 2   Insulin  Disposable Pump (OMNIPOD 5 G6 POD, GEN 5,) MISC      insulin  glargine (LANTUS ) 100 UNIT/ML injection Inject 40 Units into the skin daily.     Insulin  Pen Needle 31G X 8 MM MISC As needed for insulin  injections 100 each 1   ondansetron   (ZOFRAN -ODT) 8 MG disintegrating tablet Take 1 tablet (8 mg total) by mouth every 8 (eight) hours as needed for nausea or vomiting. 20 tablet 0   rizatriptan  (MAXALT ) 10 MG tablet Take 1 tablet (10 mg total) by mouth as needed for migraine. May repeat in 2 hours if needed.  Maximum 2 tablets in 24 hours. 10 tablet 5   Semaglutide-Weight Management (WEGOVY) 1.7 MG/0.75ML SOAJ Inject 1.7 mg into the skin.     topiramate  (TOPAMAX ) 25 MG tablet TAKE 1 TABLET BY MOUTH EVERYDAY AT BEDTIME 90 tablet 0   tranexamic acid  (LYSTEDA ) 650 MG TABS tablet Take 2 tablets (1,300 mg total) by mouth 3 (three) times daily. Take during menses for a maximum of five days 30 tablet 6   traZODone  (DESYREL ) 50 MG tablet Take 1 tablet (50 mg total) by mouth at bedtime as needed for sleep. 30 tablet 0   valACYclovir  (VALTREX ) 500 MG tablet TAKE 1 TABLET DAILY. CAN INCREASE TO TWICE A DAY FOR 5 DAYS IN THE EVENT OF A RECURRENCE 30 tablet 11   valACYclovir  (VALTREX ) 500 MG tablet Take 1 tablet (500 mg total) by mouth daily. Can increase to twice a day for 5 days in the event of a recurrence 90 tablet 3   VENTOLIN  HFA 108 (90 Base) MCG/ACT inhaler INHALE 1-2 PUFFS BY MOUTH EVERY 6 HOURS AS NEEDED FOR WHEEZE OR SHORTNESS OF BREATH 18 each 5   fluconazole  (DIFLUCAN ) 150 MG tablet Take 1 tablet (150 mg total) by mouth every three (3) days as  needed. (Patient not taking: Reported on 03/10/2024) 2 tablet 0   fluconazole  (DIFLUCAN ) 150 MG tablet Take 1 tablet by mouth. Repeat in 3 days if symptoms persists. (Patient not taking: Reported on 03/10/2024) 2 tablet 0   metroNIDAZOLE  (FLAGYL ) 500 MG tablet Take 1 tablet (500 mg total) by mouth 2 (two) times daily. (Patient not taking: Reported on 03/10/2024) 14 tablet 0   No current facility-administered medications for this visit.    Allergies: Banana, Adhesive [tape], Citrus, and Tapentadol  Past Medical History:  Diagnosis Date   Abnormal Pap smear    colpo scheduled   Alpha thalassemia  trait    Anemia    Anxiety    Asthma    Bipolar 1 disorder (HCC)    Chlamydia    Depression    Diabetes mellitus    type 1 on insulin    Fx ankle    history of left ankle fx at age 89   Headache(784.0)    Herpes simplex infection    Hx of migraines    Pregnancy induced hypertension    Urinary tract infection     Past Surgical History:  Procedure Laterality Date   CESAREAN SECTION Bilateral 08/21/2018   Procedure: CESAREAN SECTION WITH BILATERAL TUBAL LIGATION;  Surgeon: Nicholaus Burnard HERO, MD;  Location: [facility] BIRTHING SUITES;  Service: Obstetrics;  Laterality: Bilateral;   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   TUBAL LIGATION     WISDOM TOOTH EXTRACTION      Family History  Problem Relation Age of Onset   Asthma Mother    Diabetes Mother    Hypertension Mother    Asthma Father    Hypertension Sister    Diabetes Maternal Grandmother    Cancer Maternal Grandmother        breast   Obesity Other    Sleep apnea Other    Anesthesia problems Neg Hx     Social History   Tobacco Use   Smoking status: Never    Passive exposure: Never   Smokeless tobacco: Never  Substance Use Topics   Alcohol use: Yes    Comment: occ    Subjective:   Requesting to have repeat urine culture and vaginal swab done; is concerned she may have a UTI or BV again; has been working with her GYN for recurrent vaginitis;   Objective:  Vitals:   03/10/24 1530  BP: 122/80  Weight: 196 lb 9.6 oz (89.2 kg)  Height: 5' 9 (1.753 m)    General: Well developed, well nourished, in no acute distress  Skin : Warm and dry.  Head: Normocephalic and atraumatic  Lungs: Respirations unlabored; clear to auscultation bilaterally without wheeze, rales, rhonchi  CVS exam: normal rate and regular rhythm.  Neurologic: Alert and oriented; speech intact; face symmetrical; moves all extremities well; CNII-XII intact without focal deficit   Assessment:  1. Urinary frequency   2. Recurrent vaginitis      Plan:  Check U/A and urine culture; will also check vaginal swab to rule out bacterial vaginosis; encouraged to follow up with GYN to discuss concerns.   No follow-ups on file.  Orders Placed This Encounter  Procedures   Urine Culture   POCT Urinalysis Dipstick    Requested Prescriptions    No prescriptions requested or ordered in this encounter

## 2024-03-10 NOTE — Patient Instructions (Addendum)
 Lactobacillus Aciophilus- probiotic that some women use with recurrent BV;  Ask your GYN what their experience with using coconut oil to help with recurrent vaginal issues?  Ask your GYN about boric acid supplements that some women use to help with recurrent vaginal issues?

## 2024-03-11 LAB — URINE CULTURE
MICRO NUMBER:: 16910133
Result:: NO GROWTH
SPECIMEN QUALITY:: ADEQUATE

## 2024-03-12 ENCOUNTER — Ambulatory Visit: Payer: Self-pay | Admitting: Family

## 2024-03-12 LAB — CERVICOVAGINAL ANCILLARY ONLY
Bacterial Vaginitis (gardnerella): NEGATIVE
Candida Glabrata: NEGATIVE
Candida Vaginitis: NEGATIVE
Chlamydia: NEGATIVE
Comment: NEGATIVE
Comment: NEGATIVE
Comment: NEGATIVE
Comment: NEGATIVE
Comment: NEGATIVE
Comment: NORMAL
Neisseria Gonorrhea: NEGATIVE
Trichomonas: NEGATIVE

## 2024-03-13 ENCOUNTER — Encounter: Payer: Self-pay | Admitting: Family Medicine

## 2024-03-13 ENCOUNTER — Other Ambulatory Visit: Payer: Self-pay

## 2024-03-13 ENCOUNTER — Emergency Department (HOSPITAL_BASED_OUTPATIENT_CLINIC_OR_DEPARTMENT_OTHER)
Admission: EM | Admit: 2024-03-13 | Discharge: 2024-03-13 | Disposition: A | Discharge status: Home / Self Care | Attending: Emergency Medicine | Admitting: Emergency Medicine

## 2024-03-13 ENCOUNTER — Encounter (HOSPITAL_BASED_OUTPATIENT_CLINIC_OR_DEPARTMENT_OTHER): Payer: Self-pay | Admitting: Emergency Medicine

## 2024-03-13 DIAGNOSIS — T7421XA Adult sexual abuse, confirmed, initial encounter: Secondary | ICD-10-CM

## 2024-03-13 DIAGNOSIS — T7621XA Adult sexual abuse, suspected, initial encounter: Secondary | ICD-10-CM | POA: Diagnosis not present

## 2024-03-13 DIAGNOSIS — Z7982 Long term (current) use of aspirin: Secondary | ICD-10-CM | POA: Diagnosis not present

## 2024-03-13 DIAGNOSIS — Z79899 Other long term (current) drug therapy: Secondary | ICD-10-CM | POA: Diagnosis not present

## 2024-03-13 DIAGNOSIS — R030 Elevated blood-pressure reading, without diagnosis of hypertension: Secondary | ICD-10-CM | POA: Insufficient documentation

## 2024-03-13 MED ORDER — PROMETHAZINE HCL 25 MG PO TABS
25.0000 mg | ORAL_TABLET | Freq: Three times a day (TID) | ORAL | Status: DC | PRN
  Administered 2024-03-13: 25 mg via ORAL
  Filled 2024-03-13: qty 1

## 2024-03-13 MED ORDER — METRONIDAZOLE 500 MG PO TABS
2000.0000 mg | ORAL_TABLET | Freq: Once | ORAL | Status: AC
  Administered 2024-03-13: 2000 mg via ORAL
  Filled 2024-03-13: qty 4

## 2024-03-13 MED ORDER — AZITHROMYCIN 250 MG PO TABS
1000.0000 mg | ORAL_TABLET | Freq: Once | ORAL | Status: AC
  Administered 2024-03-13: 1000 mg via ORAL
  Filled 2024-03-13: qty 4

## 2024-03-13 MED ORDER — CEFTRIAXONE SODIUM 500 MG IJ SOLR
500.0000 mg | Freq: Once | INTRAMUSCULAR | Status: AC
  Administered 2024-03-13: 500 mg via INTRAMUSCULAR
  Filled 2024-03-13: qty 500

## 2024-03-13 MED ORDER — LIDOCAINE HCL (PF) 1 % IJ SOLN
1.0000 mL | Freq: Once | INTRAMUSCULAR | Status: AC
  Administered 2024-03-13: 1.5 mL
  Filled 2024-03-13: qty 5
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
## 2024-03-13 NOTE — Discharge Instructions (Addendum)
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 Stomach pain Vomiting This list may not describe all possible side effects. Call your doctor for medical advice about side effects. You may report side effects to FDA at 1-800-FDA-1088. Where should I keep my medication? Keep out of the reach of children and pets. Store at room temperature between 15 and 30 degrees C (59 and 86 degrees F). Throw away any unused medication after the expiration date. NOTE: This sheet is a summary. It may not cover all possible information. If you have questions about this medicine, talk to your doctor, pharmacist, or health care provider.  2022 Elsevier/Gold Standard (2020-05-18 11:19:31)         [x]    Flagyl  (Metronidazole ) Capsules or Tablets               TAKE ALL 4 PILLS AT THE SAME TIME.  DO NOT TAKE THIS MEDICATION FOR 3 DAYS (72 HOURS) AFTER DRINKING ALCOHOL,   AND DO NOT DRINK ANY ALCOHOL FOR 3 DAYS (72 HOURS) AFTER YOU TAKE THIS MEDICATION !   What is this medication? METRONIDAZOLE  (me troe NI da zole) treats infections caused by bacteria or parasites. It belongs to a group of medications called antibiotics. It will not treat colds, the flu, or infections caused by viruses. This medicine may be used for other purposes; ask your health care provider or pharmacist if you have questions. COMMON BRAND NAME(S): Flagyl  What should I tell my care team before I take this medication? They need to know if you have any of these conditions: Cockayne syndrome History of blood diseases such as sickle cell anemia, anemia, or leukemia If you often drink alcohol Irregular heartbeat or rhythm Kidney disease Liver disease Yeast or fungal infection An unusual or allergic reaction to metronidazole , nitroimidazoles, or other medications, foods, dyes, or preservatives Pregnant or trying to get pregnant Breast-feeding How should I use this medication? Take this medication by mouth with water . Take it as directed on the prescription label at the same time every day. Take all of this medication unless your care team tells you to stop it early. Keep taking it even if you think you are better. Talk to your care team about the use of this medication in children. While it may be prescribed for children for selected conditions, precautions do apply. Overdosage: If you think you have taken too much of this medicine contact a poison control center or emergency room at once. NOTE: This medicine is only for you. Do not share this medicine with others. What if I miss a dose?  If you miss a dose, take it as soon as you can. If it is almost time for your next dose, take only that dose. Do not take double or extra doses. What may interact with this medication? Do not take this medication with any of the following: Alcohol or any product that contains  alcohol Cisapride Disulfiram Dronedarone Pimozide Thioridazine This medication may also interact with the following: Birth control pills Busulfan Carbamazepine Certain medications that treat or prevent blood clots like warfarin Cimetidine Lithium Other medications that prolong the QT interval (cause an abnormal heart rhythm) Phenobarbital Phenytoin This list may not describe all possible interactions. Give your health care provider a list of all the medicines, herbs, non-prescription drugs, or dietary supplements you use. Also tell them if you smoke, drink alcohol, or use illegal drugs. Some items may interact with your medicine. What should I watch for while using this medication? Tell your care team if your symptoms do not start to get better or if they get worse. Some products may contain alcohol. Ask your care team if this medication contains alcohol. Be sure to tell all care teams you are taking this medication. Certain medications, such as metronidazole  and disulfiram, can cause an unpleasant reaction when taken with alcohol. The reaction includes flushing, headache, nausea, vomiting, sweating, and increased thirst. The reaction can last from 30 minutes to several hours. If you are being treated for a sexually transmitted disease (STD), avoid sexual contact until you have finished your treatment. Your sexual partner may also need treatment. Birth control may not work properly while you are taking this medication. Talk to your care team about using an extra method of birth control. What side effects may I notice from receiving this medication? Side effects that you should report to your care team as soon as possible: Allergic reactions-skin rash, itching, hives, swelling of the face, lips, tongue, or throat Dizziness, loss of balance or coordination, confusion or trouble speaking Fever, neck pain or stiffness, sensitivity to light, headache, nausea, vomiting, confusion Heart rhythm  changes-fast or irregular heartbeat, dizziness, feeling faint or lightheaded, chest pain, trouble breathing Liver injury-right upper belly pain, loss of appetite, nausea, light-colored stool, dark yellow or brown urine, yellowing skin or eyes, unusual weakness or fatigue Pain, tingling, or numbness in the hands or feet Redness, blistering, peeling, or loosening of the skin, including inside the mouth Seizures Severe diarrhea, fever Sudden eye pain or change in vision such as blurry vision, seeing halos around lights, vision loss Unusual vaginal discharge, itching, or odor Side effects that usually do not require medical attention (report to your care team if they continue or are bothersome): Diarrhea Metallic taste in mouth Nausea Stomach pain This list may not describe all possible side effects. Call your doctor for medical advice about side effects. You may report side effects to FDA at 1-800-FDA-1088. Where should I keep my medication? Keep out of the reach of children and pets. Store between 15 and 25 degrees C (59 and 77 degrees F). Protect from light. Get rid of any unused medication after the expiration date. To get rid of medications that are no longer needed or have expired: Take the medication to a medication take-back program. Check with your pharmacy or law enforcement to find a location. If you cannot return the medication, check the label or package insert to see if the medication should be thrown out in the garbage or flushed down the toilet. If you are not sure, ask your care team.  If it is safe to put it in the trash, take the medication out of the container. Mix the medication with cat litter, dirt, coffee grounds, or other unwanted substance. Seal the mixture in a bag or container. Put it in the trash. NOTE: This sheet is a summary. It may not cover all possible information. If you have questions about this medicine, talk to your doctor, pharmacist, or health care provider.   2022 Elsevier/Gold Standard (2020-08-18 13:29:17)         [x]    Phenergan  (Promethazine  25 mg) Tablets  PROMETHAZINE  (proe-METH-a-zeen)  COMMON BRAND NAME(S):  Phenergan , Promacot, Promethazine  Hydrochloride.  There may be other brand names for this medicine. Sexual Assault Specific:  This medication has been given to you to assist with nausea or possible sleeplessness.  You have been given three 25mg  tablets to take AS NEEDED.  You may take  -1 tablet every 6-8 hours as needed. USES:  This medication is an antihistamine.  It can be used to treat allergic reactions and to treat or prevent nausea and vomiting from illness or motion sickness.  It is also used to make you sleep before surgery, and to help treat pain or nausea after surgery.   HOW TO USE:  Your doctor or healthcare provider will tell you how much of this medicine to use and how often.  Take this medicine by mouth with a glass of water  or with food or milk.  Take your doses at regular intervals.  Do not take this medication more often than directed. SIDE EFFECTS:  You should report the following side effects to your doctor or healthcare provider as soon as possible:  blurred vision, irregular heartbeat, palpitations, or chest pain or tightness, muscle or facial twitches, pain or difficulty passing urine, dark-colored urine, pale stools, seizures, skin rash (including itching or hives), slowed or shallow breathing, trouble breathing, unusual bleeding or bruising, yellowing of the eyes or skin, swelling in your face or hands, swelling or tingling in your mouth or throat, fever, sweating, confusion, pain in your upper stomach, problems with balance, walking, or speech, seeing or hearing things that are not really there (especially in children). Side effects that usually do not require medical attention but you should report to your doctor or healthcare provider if they continue or are bothersome include:  headache, nightmares, agitation,  nervousness, excitability, not being able to sleep (more likely in children), stuffy  or runny nose, dry mouth, mild skin rash or itching, ringing in your ears.  Tell your doctor or healthcare provider if your symptoms do not start to get better in 1-2 days.  You may get drowsy or dizzy.  Do not drive, use machinery, or do anything that needs mental alertness until you know how this medication will affect you.  To reduce the risk of dizzy or fainting spells, do not stand or sit up too quickly, especially if you are an older patient.  Alcohol may increase dizziness and drowsiness. Your mouth can get dry.  Chewing sugarless gum or sucking on hard candy and drinking plenty of water  may help.  Contact your doctor if the problem does not go away or is severe.  Since this medication can cause dry eyes and blurred vision, if you wear contact lenses you may feel some discomfort.  Lubricating drops may help.  See your eye doctor if the problem does not go away or is severe.  This medication can also make you sensitive to the sun.  Keep out  of the sun.  If you cannot avoid being in the sun, wear protective clothing and use sunscreen.  Do not use sunlamps or tanning beds/booths.  If you are diabetic, check your blood sugar levels regularly. This list may not describe all possible side effects.  If you notice other effects not listed above, contact your doctor.  You may report side effects to the Food & Drug Administration (FDA) at 1-800-FDA-1088. PRECAUTIONS:  Your doctor or healthcare provider needs to know if you have any of the following conditions:  glaucoma, high blood pressure or heart disease, kidney disease, liver disease, lung disease or breathing problems (like asthma), prostate trouble, pain or difficulty passing urine, seizures, an unusual or allergic reaction to promethazine  or phenothiazine medicines (e.g. perphenazine, thioridazine, Compazine, Thorazine, and Trilafon), other medicines, foods, dyes, or  preservatives, if you are pregnant or trying to get pregnant, or breast-feeding.  Talk to your pediatrician regarding the use of this medicine in children.  Special care may be needed.  This medicine should not be given to infants and children younger than 14 years old.   Make sure your doctor or healthcare professional knows if you are pregnant or breast-feeding.  Tell your doctor if you have Chronic Obstructive Pulmonary Disease (COPD), asthma, sleep apnea, if you have or have ever had Neuroleptic Malignant Syndrome (NMS),  DRUG INTERACTIONS:  Do not take this medication with any of the following medications:  medicines called MAO Inhibitors (e.g. Nardil, Parnate, Marplan, Eldepryl), or other phenothiazines like trimethobenzamide.  This medication may also interact with the following medications:  barbiturates like phenobarbital, bromocriptine, certain antidepressants, certain antihistamines used in allergy or cold medicines, epinephrine , levodopa, medications for sleep, medications for mental problems & psychotic disturbances (e.g. amitriptyline, doxepin, nortriptyline, phenylzine, selegiline, Elavil, Pamelor, Sinequan), medications for movement abnormalities such as Parkinson's Disease, medications for gastrointestinal problems, muscle relaxants, narcotic pain medications, sedatives.  Do not drink alcohol while using this medicine. This document does not contain all possible interactions.  Therefore, before using this product, tell your doctor or healthcare provider of all the products you use.  Keep a list of all your medications with you, and share the list with your doctor or healthcare provider. NOTES:  Do not share this medication with others.  If you think you have taken too much of this medicine, contact a poison control center or emergency room at once. MISSED DOSE:  If you miss a dose, take it as soon as you can.  If it is almost time for your next dose, take only that dose.  Do not take double or  extra doses. STORAGE:  Store at room temperature between 68-77 degrees F (20-25 degrees C), away from light and moisture.  Do not store in the bathroom.  Keep all medicines away from children and pets.  Do not flush medications down the toilet or pour them into the drain unless instructed to do so.  Properly discard this product when it is expired or no longer needed.  Consult your pharmacist or local waste disposal company for more details about how to safely discard this product.

## 2024-03-13 NOTE — SANE Note (Signed)
  The SANE / Insurance risk surveyor consult has been completed. The primary RN Rexene, and the ED Provider have been notified.  Please contact the SANE/FNE nurse on call listed in [facility] with any further concerns.
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
## 2024-03-18 DIAGNOSIS — R300 Dysuria: Secondary | ICD-10-CM | POA: Diagnosis not present

## 2024-03-18 DIAGNOSIS — N760 Acute vaginitis: Secondary | ICD-10-CM | POA: Diagnosis not present

## 2024-03-18 DIAGNOSIS — Z113 Encounter for screening for infections with a predominantly sexual mode of transmission: Secondary | ICD-10-CM | POA: Diagnosis not present

## 2024-03-18 DIAGNOSIS — Z3202 Encounter for pregnancy test, result negative: Secondary | ICD-10-CM | POA: Diagnosis not present

## 2024-04-16 ENCOUNTER — Encounter: Payer: Self-pay | Admitting: Obstetrics and Gynecology

## 2024-04-16 ENCOUNTER — Other Ambulatory Visit: Payer: Self-pay

## 2024-04-16 DIAGNOSIS — B379 Candidiasis, unspecified: Secondary | ICD-10-CM

## 2024-04-16 MED ORDER — FLUCONAZOLE 150 MG PO TABS: ORAL_TABLET | ORAL | 0 refills | Status: DC

## 2024-04-17 ENCOUNTER — Ambulatory Visit: Admitting: Podiatry

## 2024-05-18 DIAGNOSIS — K900 Celiac disease: Secondary | ICD-10-CM | POA: Diagnosis not present

## 2024-05-18 DIAGNOSIS — Z794 Long term (current) use of insulin: Secondary | ICD-10-CM | POA: Diagnosis not present

## 2024-05-18 DIAGNOSIS — E669 Obesity, unspecified: Secondary | ICD-10-CM | POA: Diagnosis not present

## 2024-05-18 DIAGNOSIS — E1065 Type 1 diabetes mellitus with hyperglycemia: Secondary | ICD-10-CM | POA: Diagnosis not present

## 2024-05-21 DIAGNOSIS — N3000 Acute cystitis without hematuria: Secondary | ICD-10-CM | POA: Diagnosis not present

## 2024-05-21 DIAGNOSIS — Z3202 Encounter for pregnancy test, result negative: Secondary | ICD-10-CM | POA: Diagnosis not present

## 2024-06-10 ENCOUNTER — Encounter: Payer: Self-pay | Admitting: Student

## 2024-06-10 DIAGNOSIS — F319 Bipolar disorder, unspecified: Secondary | ICD-10-CM | POA: Insufficient documentation

## 2024-06-10 DIAGNOSIS — E1065 Type 1 diabetes mellitus with hyperglycemia: Secondary | ICD-10-CM | POA: Insufficient documentation

## 2024-06-10 NOTE — Progress Notes (Unsigned)
 Subjective:     Patient ID: Christie [name], female    DOB: Sep 13, 1985, 38 y.o.   MRN: [id number]  No chief complaint on file.   HPI  Discussed the use of AI scribe software for clinical note transcription with the patient, who gave verbal consent to proceed.Not married. Works at psychologist, sport and exercise at [employer].  History of Present Illness Christie [name] is a 38 year old female with type 1 diabetes presents for [facility] appointment. She is single, has 3 children- ages 59,70, and 30 years old. Two og her children have autism, some caregiver role strain associated with this. She works full time at psychologist, sport and exercise of an engineer, petroleum.   She has type 1 diabetes managed with an Omnipod insulin  pump. Her last hemoglobin A1c was 8.4%. She follows with endocrinology every three months.  She has recurrent bacterial vaginosis and yeast infections about every other month. Treatment for bacterial vaginosis often leads to a yeast infection. Currently she has vaginal irritation without discharge and thinks recent use of coconut oil as a lubricant may have triggered these symptoms.  Her migraines have resolved and are not currently symptomatic. She has mild asthma and uses albuterol  as needed, though she has not needed it recently.  FH: Her maternal grandmother and two maternal uncles had colon cancer. Her father has heart disease, Hx CABG.  Follows with: -Endocrine- Dr. Reyes Alexander -GYN- [facility]- Dr. Lang Peel   Patient denies fever, chills, SOB, CP, palpitations, dyspnea, edema, HA, vision changes, N/V/D, abdominal pain, urinary symptoms, rash, weight changes, and recent illness or hospitalizations.    History of Present Illness              Health Maintenance Due  Topic Date Due   Pneumococcal Vaccine (1 of 2 - PCV) Never done   HPV VACCINES (1 - 3-dose SCDM series) Never done   OPHTHALMOLOGY EXAM  04/03/2023   Influenza Vaccine  02/07/2024   COVID-19 Vaccine (1 - 2025-26  season) Never done    Past Medical History:  Diagnosis Date   Abnormal Pap smear    colpo scheduled   Alpha thalassemia trait    Anemia    Anxiety    Asthma    Bipolar 1 disorder (HCC)    Celiac disease    Chlamydia    Depression    Diabetes mellitus    type 1 on insulin    DM (diabetes mellitus), type 1 (HCC)    Fx ankle    history of left ankle fx at age 59   Headache(784.0)    Herpes simplex infection    Hx of migraines    Pregnancy induced hypertension    Urinary tract infection     Past Surgical History:  Procedure Laterality Date   CESAREAN SECTION Bilateral 08/21/2018   Procedure: CESAREAN SECTION WITH BILATERAL TUBAL LIGATION;  Surgeon: Nicholaus Burnard HERO, MD;  Location: [facility] BIRTHING SU[facility];  Service: Obstetrics;  Laterality: Bilateral;   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   TUBAL LIGATION     WISDOM TOOTH EXTRACTION      Family History  Problem Relation Age of Onset   Asthma Mother    Diabetes Mother    Hypertension Mother    Asthma Father    Heart disease Father    CAD Father    Hypertension Sister    Diabetes Maternal Grandmother    Cancer Maternal Grandmother  breast   Colon cancer Maternal Grandmother    Autism spectrum disorder Daughter    Autism Son    Colon cancer Maternal Uncle    Colon cancer Maternal Uncle    Obesity Other    Sleep apnea Other    Anesthesia problems Neg Hx     Social History   Socioeconomic History   Marital status: Single    Spouse name: Not on file   Number of children: Not on file   Years of education: Not on file   Highest education level: Associate degree: occupational, scientist, product/process development, or vocational program  Occupational History   Not on file  Tobacco Use   Smoking status: Never    Passive exposure: Never   Smokeless tobacco: Never  Vaping Use   Vaping status: Never Used  Substance and Sexual Activity   Alcohol use: Not Currently   Drug use: No    Comment: last used 1 years ago   Sexual  activity: Yes    Birth control/protection: Surgical  Other Topics Concern   Not on file  Social History Narrative   Right handed   Social Drivers of Health   Financial Resource Strain: High Risk (07/09/2023)   Overall Financial Resource Strain (CARDIA)    Difficulty of Paying Living Expenses: Very hard  Food Insecurity: Food Insecurity Present (07/09/2023)   Hunger Vital Sign    Worried About Running Out of Food in the Last Year: Sometimes true    Ran Out of Food in the Last Year: Never true  Transportation Needs: No Transportation Needs (07/09/2023)   PRAPARE - Administrator, Civil Service (Medical): No    Lack of Transportation (Non-Medical): No  Physical Activity: Insufficiently Active (07/09/2023)   Exercise Vital Sign    Days of Exercise per Week: 2 days    Minutes of Exercise per Session: 50 min  Stress: Stress Concern Present (07/09/2023)   [employer] of Occupational Health - Occupational Stress Questionnaire    Feeling of Stress : Very much  Social Connections: Socially Isolated (07/09/2023)   Social Connection and Isolation Panel    Frequency of Communication with Friends and Family: Never    Frequency of Social Gatherings with Friends and Family: Never    Attends Religious Services: Never    Database Administrator or Organizations: No    Attends Engineer, Structural: Not on file    Marital Status: Never married  Intimate Partner Violence: Unknown (05/18/2023)   Received from [facility]   HITS    Physically Hurt: Not on file    Insult or Talk Down To: Not on file    Threaten Physical Harm: Not on file    Scream or Curse: Not on file    Outpatient Medications Prior to Visit  Medication Sig Dispense Refill   aspirin -acetaminophen -caffeine  (EXCEDRIN MIGRAINE) 250-250-65 MG tablet Take 2 tablets by mouth every 6 (six) hours as needed for headache or migraine.     insulin  aspart (NOVOLOG ) 100 UNIT/ML injection TAKE AFTER MEALS- 200-250  TAKE 4 UNITS, 251-300 TAKE 6 UNITS, 301-350 8 UNITS , 351-400 10 UNITS. 20 mL 2   Insulin  Disposable Pump (OMNIPOD 5 G6 POD, GEN 5,) MISC      valACYclovir  (VALTREX ) 500 MG tablet Take 1 tablet (500 mg total) by mouth daily. Can increase to twice a day for 5 days in the event of a recurrence 90 tablet 3   VENTOLIN  HFA 108 (90 Base) MCG/ACT inhaler INHALE 1-2 PUFFS  BY MOUTH EVERY 6 HOURS AS NEEDED FOR WHEEZE OR SHORTNESS OF BREATH 18 each 5   rizatriptan  (MAXALT ) 10 MG tablet Take 1 tablet (10 mg total) by mouth as needed for migraine. May repeat in 2 hours if needed.  Maximum 2 tablets in 24 hours. 10 tablet 5   Continuous Blood Gluc Receiver (DEXCOM G6 RECEIVER) DEVI See admin instructions.     Continuous Blood Gluc Sensor (DEXCOM G6 SENSOR) MISC CHANGE SENSOR EVERY 10 DAYS     Continuous Blood Gluc Transmit (DEXCOM G6 TRANSMITTER) MISC AS DIRECTED FOR 90 DAYS     metroNIDAZOLE  (FLAGYL ) 500 MG tablet Take 1 tablet (500 mg total) by mouth 2 (two) times daily. (Patient not taking: Reported on 03/10/2024) 14 tablet 0   docusate sodium  (COLACE) 100 MG capsule Take 1 capsule (100 mg total) by mouth every 12 (twelve) hours. 60 capsule 0   fluconazole  (DIFLUCAN ) 150 MG tablet Take 1 tablet (150 mg total) by mouth every three (3) days as needed. (Patient not taking: Reported on 03/10/2024) 2 tablet 0   fluconazole  (DIFLUCAN ) 150 MG tablet Take 1 tablet by mouth. Repeat in 3 days if symptoms persists. 2 tablet 0   insulin  glargine (LANTUS ) 100 UNIT/ML injection Inject 40 Units into the skin daily.     Insulin  Pen Needle 31G X 8 MM MISC As needed for insulin  injections 100 each 1   ondansetron  (ZOFRAN -ODT) 8 MG disintegrating tablet Take 1 tablet (8 mg total) by mouth every 8 (eight) hours as needed for nausea or vomiting. 20 tablet 0   Semaglutide-Weight Management (WEGOVY) 1.7 MG/0.75ML SOAJ Inject 1.7 mg into the skin.     topiramate  (TOPAMAX ) 25 MG tablet TAKE 1 TABLET BY MOUTH EVERYDAY AT BEDTIME 90 tablet 0    tranexamic acid  (LYSTEDA ) 650 MG TABS tablet Take 2 tablets (1,300 mg total) by mouth 3 (three) times daily. Take during menses for a maximum of five days 30 tablet 6   traZODone  (DESYREL ) 50 MG tablet Take 1 tablet (50 mg total) by mouth at bedtime as needed for sleep. 30 tablet 0   valACYclovir  (VALTREX ) 500 MG tablet TAKE 1 TABLET DAILY. CAN INCREASE TO TWICE A DAY FOR 5 DAYS IN THE EVENT OF A RECURRENCE 30 tablet 11   No facility-administered medications prior to visit.    Allergies  Allergen Reactions   Banana Anaphylaxis   Adhesive [Tape] Rash   Citrus Rash and Other (See Comments)    Fresh only - Gums bleed   Tapentadol Rash    ROS See HPI    Objective:    Physical Exam Vitals reviewed.  Constitutional:      General: She is not in acute distress.    Appearance: She is not toxic-appearing.  HENT:     Head: Normocephalic and atraumatic.     Mouth/Throat:     Mouth: Mucous membranes are moist.     Pharynx: Oropharynx is clear.  Eyes:     Pupils: Pupils are equal, round, and reactive to light.  Cardiovascular:     Rate and Rhythm: Normal rate and regular rhythm.     Pulses: Normal pulses.     Heart sounds: Normal heart sounds. No murmur heard. Pulmonary:     Effort: Pulmonary effort is normal. No respiratory distress.     Breath sounds: Normal breath sounds. No wheezing.  Musculoskeletal:        General: No swelling.     Cervical back: Neck supple.  Skin:  General: Skin is warm and dry.  Neurological:     General: No focal deficit present.     Mental Status: She is alert and oriented to person, place, and time.  Psychiatric:        Mood and Affect: Mood normal.        Behavior: Behavior normal.        Thought Content: Thought content normal.        Judgment: Judgment normal.      BP 112/68   Pulse 84   Ht 5' 9 (1.753 m)   Wt 208 lb 6.4 oz (94.5 kg)   SpO2 99%   BMI 30.78 kg/m  Wt Readings from Last 3 Encounters:  06/11/24 208 lb 6.4 oz (94.5  kg)  03/10/24 196 lb 9.6 oz (89.2 kg)  01/23/24 201 lb (91.2 kg)       Assessment & Plan:   Problem List Items Addressed This Visit     Adjustment disorder with depressed mood - Primary   Stable. No medications currently. Denies SI/HI.     03/10/2024    3:37 PM 11/01/2022    9:38 AM 09/28/2022    3:39 PM 11/24/2021    3:59 PM  GAD 7 : Generalized Anxiety Score  Nervous, Anxious, on Edge 0 3 1 2   Control/stop worrying 2 3 2 3   Worry too much - different things 2 2 2 3   Trouble relaxing 0 3 1 3   Restless 0 2 0 1  Easily annoyed or irritable 1 2 3 3   Afraid - awful might happen 0 2 0 2  Total GAD 7 Score 5 17 9 17   Anxiety Difficulty Not difficult at all  Somewhat difficult Very difficult        06/11/2024   10:16 AM 03/10/2024    3:36 PM 11/01/2022    9:37 AM 09/28/2022    3:38 PM 06/14/2022   10:35 AM  Depression screen PHQ 2/9  Decreased Interest 1 0 3 2 0  Down, Depressed, Hopeless 0 1 2 2  0  PHQ - 2 Score 1 1 5 4  0  Altered sleeping  2 3 3  0  Tired, decreased energy  2 3 1  0  Change in appetite  0 3 2 0  Feeling bad or failure about yourself   0 3 1 0  Trouble concentrating  0 2 1 0  Moving slowly or fidgety/restless  0 0 0 0  Suicidal thoughts  0 0 0 0  PHQ-9 Score  5  19  12   0   Difficult doing work/chores  Not difficult at all  Somewhat difficult Not difficult at all     Data saved with a previous flowsheet row definition         Asthma, mild intermittent   Stable, no recent exacerbations. Uses albuterol  sparingly.      Family history of colon cancer   Plan CRC screen 38 yo      Herpes simplex infection   Stable on valcyclovir.      Relevant Medications   fluconazole  (DIFLUCAN ) 150 MG tablet   Recurrent vaginitis   Alternating episodes of BV and yeast infections for many years. Pt reports getting BV or yeast infection q 1-2 months.  - Rx-Diflucan   - Vaginal swab pending - Recommended hypoallergenic, mild vaginal soap such (I.e Saunders Dawes) -  Advised using coconut oil for lubricant, water -based lubricants only  -Avoid changing soaps frequently, as this can cause irritation and c/t vaginitis -  Consider boric acid regimine if recurrent infections persist.      Relevant Orders   Cervicovaginal ancillary only( [place])   Type 1 diabetes mellitus with hyperglycemia (HCC)   Follows with endocrinology. Managed with insulin  pump. Minimize simple carbs. Increase exercise as tolerated. Continue current meds        FU 2 months CPE    I have discontinued Cathleen D. Lambert's Insulin  Pen Needle, traZODone , ondansetron , rizatriptan , insulin  glargine, docusate sodium , topiramate , tranexamic acid , fluconazole , Wegovy, and fluconazole . I am also having her start on fluconazole . Additionally, I am having her maintain her insulin  aspart, Dexcom G6 Sensor, Dexcom G6 Receiver, Dexcom G6 Transmitter, aspirin -acetaminophen -caffeine , Omnipod 5 DexG7G6 Pods Gen 5, Ventolin  HFA, metroNIDAZOLE , and valACYclovir .  Meds ordered this encounter  Medications   fluconazole  (DIFLUCAN ) 150 MG tablet    Sig: Take 1 tablet (150 mg total) by mouth daily. May repeat in 3 days if needed.    Dispense:  2 tablet    Refill:  0    Supervising Provider:   DOMENICA BLACKBIRD A [[id number]]

## 2024-06-11 ENCOUNTER — Encounter: Payer: Self-pay | Admitting: Student

## 2024-06-11 ENCOUNTER — Other Ambulatory Visit (HOSPITAL_COMMUNITY)
Admission: RE | Admit: 2024-06-11 | Discharge: 2024-06-11 | Disposition: A | Discharge status: Home / Self Care | Source: Ambulatory Visit | Attending: Student | Admitting: Student

## 2024-06-11 ENCOUNTER — Ambulatory Visit: Admitting: Student

## 2024-06-11 VITALS — BP 112/68 | HR 84 | Ht 69.0 in | Wt 208.4 lb

## 2024-06-11 DIAGNOSIS — Z800 Family history of malignant neoplasm of digestive organs: Secondary | ICD-10-CM | POA: Diagnosis not present

## 2024-06-11 DIAGNOSIS — F4321 Adjustment disorder with depressed mood: Secondary | ICD-10-CM

## 2024-06-11 DIAGNOSIS — B009 Herpesviral infection, unspecified: Secondary | ICD-10-CM | POA: Diagnosis not present

## 2024-06-11 DIAGNOSIS — Z794 Long term (current) use of insulin: Secondary | ICD-10-CM | POA: Diagnosis not present

## 2024-06-11 DIAGNOSIS — E1065 Type 1 diabetes mellitus with hyperglycemia: Secondary | ICD-10-CM | POA: Diagnosis not present

## 2024-06-11 DIAGNOSIS — N760 Acute vaginitis: Secondary | ICD-10-CM | POA: Diagnosis not present

## 2024-06-11 DIAGNOSIS — F319 Bipolar disorder, unspecified: Secondary | ICD-10-CM

## 2024-06-11 DIAGNOSIS — J4520 Mild intermittent asthma, uncomplicated: Secondary | ICD-10-CM | POA: Diagnosis not present

## 2024-06-11 LAB — CERVICOVAGINAL ANCILLARY ONLY
Candida Glabrata: NEGATIVE
Candida Vaginitis: NEGATIVE
Chlamydia: NEGATIVE
Comment: NEGATIVE
Comment: NEGATIVE
Comment: NEGATIVE
Comment: NEGATIVE
Comment: NORMAL
Neisseria Gonorrhea: NEGATIVE
Trichomonas: NEGATIVE

## 2024-06-11 MED ORDER — FLUCONAZOLE 150 MG PO TABS: 150.0000 mg | ORAL_TABLET | Freq: Every day | ORAL | 0 refills | Status: DC

## 2024-06-11 NOTE — Assessment & Plan Note (Signed)
 Plan CRC screen 38 yo

## 2024-06-11 NOTE — Assessment & Plan Note (Addendum)
 Alternating episodes of BV and yeast infections for many years. Pt reports getting BV or yeast infection q 1-2 months.  - Rx-Diflucan   - Vaginal swab pending - Recommended hypoallergenic, mild vaginal soap such (I.e Saunders Dawes) - Advised using coconut oil for lubricant, water -based lubricants only  -Avoid changing soaps frequently, as this can cause irritation and c/t vaginitis - Consider boric acid regimine if recurrent infections persist.

## 2024-06-11 NOTE — Assessment & Plan Note (Signed)
 Stable on valcyclovir.

## 2024-06-11 NOTE — Assessment & Plan Note (Addendum)
 Follows with endocrinology. Managed with insulin  pump. Minimize simple carbs. Increase exercise as tolerated. Continue current meds

## 2024-06-11 NOTE — Assessment & Plan Note (Signed)
 Stable, no recent exacerbations. Uses albuterol  sparingly.

## 2024-06-11 NOTE — Assessment & Plan Note (Addendum)
 Stable. No medications currently. Denies SI/HI.     03/10/2024    3:37 PM 11/01/2022    9:38 AM 09/28/2022    3:39 PM 11/24/2021    3:59 PM  GAD 7 : Generalized Anxiety Score  Nervous, Anxious, on Edge 0 3 1 2   Control/stop worrying 2 3 2 3   Worry too much - different things 2 2 2 3   Trouble relaxing 0 3 1 3   Restless 0 2 0 1  Easily annoyed or irritable 1 2 3 3   Afraid - awful might happen 0 2 0 2  Total GAD 7 Score 5 17 9 17   Anxiety Difficulty Not difficult at all  Somewhat difficult Very difficult        06/11/2024   10:16 AM 03/10/2024    3:36 PM 11/01/2022    9:37 AM 09/28/2022    3:38 PM 06/14/2022   10:35 AM  Depression screen PHQ 2/9  Decreased Interest 1 0 3 2 0  Down, Depressed, Hopeless 0 1 2 2  0  PHQ - 2 Score 1 1 5 4  0  Altered sleeping  2 3 3  0  Tired, decreased energy  2 3 1  0  Change in appetite  0 3 2 0  Feeling bad or failure about yourself   0 3 1 0  Trouble concentrating  0 2 1 0  Moving slowly or fidgety/restless  0 0 0 0  Suicidal thoughts  0 0 0 0  PHQ-9 Score  5  19  12   0   Difficult doing work/chores  Not difficult at all  Somewhat difficult Not difficult at all     Data saved with a previous flowsheet row definition

## 2024-06-12 ENCOUNTER — Ambulatory Visit: Payer: Self-pay | Admitting: Student

## 2024-06-23 LAB — OPHTHALMOLOGY REPORT-SCANNED

## 2024-07-11 ENCOUNTER — Emergency Department (HOSPITAL_BASED_OUTPATIENT_CLINIC_OR_DEPARTMENT_OTHER)

## 2024-07-11 ENCOUNTER — Emergency Department (HOSPITAL_BASED_OUTPATIENT_CLINIC_OR_DEPARTMENT_OTHER)
Admission: EM | Admit: 2024-07-11 | Discharge: 2024-07-11 | Disposition: A | Discharge status: Home / Self Care | Attending: Emergency Medicine | Admitting: Emergency Medicine

## 2024-07-11 ENCOUNTER — Encounter (HOSPITAL_BASED_OUTPATIENT_CLINIC_OR_DEPARTMENT_OTHER): Payer: Self-pay | Admitting: Emergency Medicine

## 2024-07-11 ENCOUNTER — Other Ambulatory Visit: Payer: Self-pay

## 2024-07-11 DIAGNOSIS — S29012A Strain of muscle and tendon of back wall of thorax, initial encounter: Secondary | ICD-10-CM | POA: Insufficient documentation

## 2024-07-11 DIAGNOSIS — Z7982 Long term (current) use of aspirin: Secondary | ICD-10-CM | POA: Insufficient documentation

## 2024-07-11 DIAGNOSIS — S161XXA Strain of muscle, fascia and tendon at neck level, initial encounter: Secondary | ICD-10-CM | POA: Diagnosis not present

## 2024-07-11 DIAGNOSIS — S199XXA Unspecified injury of neck, initial encounter: Secondary | ICD-10-CM | POA: Diagnosis present

## 2024-07-11 DIAGNOSIS — X500XXA Overexertion from strenuous movement or load, initial encounter: Secondary | ICD-10-CM | POA: Diagnosis not present

## 2024-07-11 DIAGNOSIS — T148XXA Other injury of unspecified body region, initial encounter: Secondary | ICD-10-CM

## 2024-07-11 DIAGNOSIS — E109 Type 1 diabetes mellitus without complications: Secondary | ICD-10-CM | POA: Diagnosis not present

## 2024-07-11 LAB — BASIC METABOLIC PANEL WITH GFR
Anion gap: 9 (ref 5–15)
BUN: 14 mg/dL (ref 6–20)
CO2: 25 mmol/L (ref 22–32)
Calcium: 8.6 mg/dL — ABNORMAL LOW (ref 8.9–10.3)
Chloride: 104 mmol/L (ref 98–111)
Creatinine, Ser: 0.71 mg/dL (ref 0.44–1.00)
GFR, Estimated: >60 mL/min
Glucose, Bld: 129 mg/dL — ABNORMAL HIGH (ref 70–99)
Potassium: 4.1 mmol/L (ref 3.5–5.1)
Sodium: 138 mmol/L (ref 135–145)

## 2024-07-11 LAB — CBC WITH DIFFERENTIAL/PLATELET
Abs Immature Granulocytes: 0.01 K/uL (ref 0.00–0.07)
Basophils Absolute: 0 K/uL (ref 0.0–0.1)
Basophils Relative: 0 %
Eosinophils Absolute: 0 K/uL (ref 0.0–0.5)
Eosinophils Relative: 1 %
HCT: 28.5 % — ABNORMAL LOW (ref 36.0–46.0)
Hemoglobin: 8 g/dL — ABNORMAL LOW (ref 12.0–15.0)
Immature Granulocytes: 0 %
Lymphocytes Relative: 46 %
Lymphs Abs: 1.8 K/uL (ref 0.7–4.0)
MCH: 18.5 pg — ABNORMAL LOW (ref 26.0–34.0)
MCHC: 28.1 g/dL — ABNORMAL LOW (ref 30.0–36.0)
MCV: 65.8 fL — ABNORMAL LOW (ref 80.0–100.0)
Monocytes Absolute: 0.5 K/uL (ref 0.1–1.0)
Monocytes Relative: 12 %
Neutro Abs: 1.6 K/uL — ABNORMAL LOW (ref 1.7–7.7)
Neutrophils Relative %: 41 %
Platelets: 197 K/uL (ref 150–400)
RBC: 4.33 MIL/uL (ref 3.87–5.11)
RDW: 18.7 % — ABNORMAL HIGH (ref 11.5–15.5)
WBC: 3.9 K/uL — ABNORMAL LOW (ref 4.0–10.5)
nRBC: 0 % (ref 0.0–0.2)

## 2024-07-11 LAB — TROPONIN T, HIGH SENSITIVITY: Troponin T High Sensitivity: <15 ng/L (ref 0–19)

## 2024-07-11 LAB — HCG, SERUM, QUALITATIVE: Preg, Serum: NEGATIVE

## 2024-07-11 MED ORDER — ACETAMINOPHEN 500 MG PO TABS
1000.0000 mg | ORAL_TABLET | Freq: Once | ORAL | Status: AC
  Administered 2024-07-11: 1000 mg via ORAL
  Filled 2024-07-11: qty 2

## 2024-07-11 MED ORDER — CYCLOBENZAPRINE HCL 10 MG PO TABS: 10.0000 mg | ORAL_TABLET | Freq: Two times a day (BID) | ORAL | 0 refills | Status: AC | PRN

## 2024-07-11 MED ORDER — OXYCODONE HCL 5 MG PO TABS
5.0000 mg | ORAL_TABLET | Freq: Once | ORAL | Status: AC
  Administered 2024-07-11: 5 mg via ORAL
  Filled 2024-07-11: qty 1

## 2024-07-11 MED ORDER — ONDANSETRON HCL 4 MG/2ML IJ SOLN
4.0000 mg | Freq: Once | INTRAMUSCULAR | Status: AC
  Administered 2024-07-11: 4 mg via INTRAVENOUS
  Filled 2024-07-11: qty 2

## 2024-07-11 MED ORDER — LIDOCAINE 5 % EX PTCH
1.0000 | MEDICATED_PATCH | Freq: Once | CUTANEOUS | Status: DC
  Administered 2024-07-11: 1 via TRANSDERMAL
  Filled 2024-07-11: qty 1

## 2024-07-11 MED ORDER — CYCLOBENZAPRINE HCL 5 MG PO TABS
5.0000 mg | ORAL_TABLET | Freq: Once | ORAL | Status: AC
  Administered 2024-07-11: 5 mg via ORAL
  Filled 2024-07-11: qty 1

## 2024-07-11 MED ORDER — KETOROLAC TROMETHAMINE 15 MG/ML IJ SOLN
15.0000 mg | Freq: Once | INTRAMUSCULAR | Status: AC
  Administered 2024-07-11: 15 mg via INTRAVENOUS
  Filled 2024-07-11: qty 1

## 2024-07-11 NOTE — ED Triage Notes (Signed)
 Pain right side back/neck/shoulder. Denies injury. X 3 days

## 2024-07-11 NOTE — Discharge Instructions (Addendum)
 Christie [name]  Thank you for allowing us  to take care of you today.  You came to the Emergency Department today because pain in your neck and back radiating to your chest.  Here in the emergency department we got a chest x-ray as well as a shoulder x-ray that did not show any abnormalities, your heart number were negative, and you are feeling better after we treated you for a possible spasm.  You do have low blood counts including your hemoglobin, they have been low previously, however they are more low currently, this is unlikely to be contributing to your muscular pain, however it is something that you should follow-up with your primary care doctor and OB/GYN to see if this is potentially related to low iron , heavy vaginal bleeding, etc.  You can use Tylenol  and ibuprofen  every 4-6 hours as needed, you can use over-the-counter lidocaine  patches which you can get at most grocery stores and pharmacies.  We will prescribe you Flexeril , this is a muscle relaxer that you can take up to twice a day as needed, you should not drive after taking this medication as it can make you sleepy.   To-Do: 1. Please follow-up with your primary doctor within 1 - 2 weeks / as soon as possible.   Please return to the Emergency Department or call 911 if you experience have worsening of your symptoms, or do not get better, new or different chest pain, shortness of breath, severe or significantly worsening pain, high fever, severe confusion, pass out or have any reason to think that you need emergency medical care.   We hope you feel better soon.   Mitzie Later, MD Department of Emergency Medicine [facility]

## 2024-07-11 NOTE — ED Notes (Signed)
 Discharge instructions reviewed with patient. Patient verbalizes understanding, no further questions at this time. Medications/prescriptions and follow up information provided. No acute distress noted at time of departure.
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
## 2024-07-14 ENCOUNTER — Ambulatory Visit: Admitting: Student

## 2024-07-14 ENCOUNTER — Encounter: Payer: Self-pay | Admitting: Student

## 2024-07-14 DIAGNOSIS — D649 Anemia, unspecified: Secondary | ICD-10-CM | POA: Insufficient documentation

## 2024-07-14 DIAGNOSIS — R011 Cardiac murmur, unspecified: Secondary | ICD-10-CM | POA: Diagnosis not present

## 2024-07-14 DIAGNOSIS — R5383 Other fatigue: Secondary | ICD-10-CM | POA: Diagnosis not present

## 2024-07-14 MED ORDER — MULTI-VITAMIN/MINERALS PO TABS: 1.0000 | ORAL_TABLET | Freq: Every day | ORAL | Status: AC

## 2024-07-14 NOTE — Progress Notes (Signed)
 Patient: Christie [name] DOB: 11-17-1985  DOS: 07/14/2024  SUBJECTIVE:  Chief Complaint:   No chief complaint on file.   Christie [name] is a 39 y.o.  female for hopistal FU  Presented to ED 07/11/24 for complaining of right-sided back pain.She reported  a tight sensation in her right sided back/shoulder radiating to her neck, worsens with movement, particularly with rightward movement of her neck. She picked up son during the holidays, weighing about 100 lbs, pain started after this.  She was diagnosed with a muscle strain. This has improved since ED visit. CXR and Right shoulger X ray negative.   Reprots Hx IDA. Her menstrual periods are heavy, LMP: December 15-18. Denies blood in stool and hematuria. She has asthma, which may contribute to shortness of breath. CBC from ED visit showed low Hgb/Hct and low Calcium  levels. Reports fatigue, occasional dizziness, SOB with exertion, craving and chewing ice, fatigue.  Patient denies fever, chills, CP, palpitations,  edema, vision changes, N/V/D, abdominal pain, urinary symptoms, rash, weight changes, and recent illness or hospitalizations.   Past Medical History:  Diagnosis Date   Abnormal Pap smear    colpo scheduled   Alpha thalassemia trait    Anemia    Anxiety    Asthma    Bipolar 1 disorder (HCC)    Celiac disease    Chlamydia    Depression    Diabetes mellitus    type 1 on insulin    DM (diabetes mellitus), type 1 (HCC)    Fx ankle    history of left ankle fx at age 65   Headache(784.0)    Herpes simplex infection    Hx of migraines    Pregnancy induced hypertension    Urinary tract infection     Objective:  VITAL SIGNS: BP 139/84   Pulse 88   Ht 5' 9 (1.753 m)   Wt 217 lb (98.4 kg)   LMP 06/22/2024 (Approximate)   SpO2 100%   BMI 32.05 kg/m  Constitutional: Well formed, well developed. No acute distress. HENT: Normocephalic, atraumatic.  CV: +murmur, no LE edema Thorax & Lungs: CTAB, No accessory muscle  use.  Neurologic: CN I-XII intanct, normal gait.  Psychiatric: Normal mood. Age appropriate judgment and insight. Alert & oriented x 3.    Assessment:  Low calcium  levels - Plan: Basic Metabolic Panel (BMET), PTH, intact (no Ca), Magnesium   Low hemoglobin - Plan: Iron , TIBC and Ferritin Panel, CBC, B12 and Folate Panel, Fecal occult blood, imunochemical  Anemia, unspecified type  Heart murmur - Plan: Ambulatory referral to Cardiology  Other fatigue  Plan: Stretches/exercises, heat, ice, Tylenol , NSAIDs. The patient voiced understanding and agreement to the plan. RTC 3 months CPE  Low H&H Hx of IDA, with current symptoms of dizziness, fatigue, pica, and shortness of breath on exertion. - Ordered iron  panel, B12, folate, FOBT. - Recommended daily MVM.  Hypocalcemia Low calcium  levels. Recheck Calcium  - Ordered calcium , PTH, Mg, vitamin D, and magnesium  levels.  Cardiac murmur New murmur.  - Refer to cardiology for evaluation    Harlene LITTIE Jolly, NP 07/14/2024  3:22 PM

## 2024-07-14 NOTE — Patient Instructions (Addendum)
 Christie [name]

## 2024-07-15 ENCOUNTER — Ambulatory Visit

## 2024-07-15 ENCOUNTER — Telehealth: Payer: Self-pay | Admitting: Neurology

## 2024-07-15 ENCOUNTER — Ambulatory Visit: Payer: Self-pay | Admitting: Student

## 2024-07-15 ENCOUNTER — Ambulatory Visit: Admitting: Physician Assistant

## 2024-07-15 ENCOUNTER — Encounter: Payer: Self-pay | Admitting: Physician Assistant

## 2024-07-15 VITALS — BP 126/84 | HR 86 | Ht 69.0 in | Wt 221.1 lb

## 2024-07-15 DIAGNOSIS — R011 Cardiac murmur, unspecified: Secondary | ICD-10-CM | POA: Insufficient documentation

## 2024-07-15 DIAGNOSIS — D649 Anemia, unspecified: Secondary | ICD-10-CM | POA: Diagnosis present

## 2024-07-15 DIAGNOSIS — R002 Palpitations: Secondary | ICD-10-CM | POA: Diagnosis present

## 2024-07-15 DIAGNOSIS — E1065 Type 1 diabetes mellitus with hyperglycemia: Secondary | ICD-10-CM | POA: Insufficient documentation

## 2024-07-15 DIAGNOSIS — F419 Anxiety disorder, unspecified: Secondary | ICD-10-CM | POA: Diagnosis present

## 2024-07-15 DIAGNOSIS — D508 Other iron deficiency anemias: Secondary | ICD-10-CM

## 2024-07-15 LAB — BASIC METABOLIC PANEL WITH GFR
BUN: 13 mg/dL (ref 6–23)
CO2: 28 meq/L (ref 19–32)
Calcium: 8.5 mg/dL (ref 8.4–10.5)
Chloride: 103 meq/L (ref 96–112)
Creatinine, Ser: 0.69 mg/dL (ref 0.40–1.20)
GFR: 111.74 mL/min
Glucose, Bld: 44 mg/dL — CL (ref 70–99)
Potassium: 3.6 meq/L (ref 3.5–5.1)
Sodium: 137 meq/L (ref 135–145)

## 2024-07-15 LAB — IRON,TIBC AND FERRITIN PANEL
%SAT: 7 % — ABNORMAL LOW (ref 16–45)
Ferritin: 5 ng/mL — ABNORMAL LOW (ref 16–154)
Iron: 30 ug/dL — ABNORMAL LOW (ref 40–190)
TIBC: 428 ug/dL (ref 250–450)

## 2024-07-15 LAB — CBC
HCT: 25 % — ABNORMAL LOW (ref 36.0–46.0)
Hemoglobin: 7.4 g/dL — CL (ref 12.0–15.0)
MCHC: 29.4 g/dL — ABNORMAL LOW (ref 30.0–36.0)
MCV: 63 fl — ABNORMAL LOW (ref 78.0–100.0)
Platelets: 186 K/uL (ref 150.0–400.0)
RBC: 3.97 Mil/uL (ref 3.87–5.11)
RDW: 19.9 % — ABNORMAL HIGH (ref 11.5–15.5)
WBC: 4.6 K/uL (ref 4.0–10.5)

## 2024-07-15 LAB — B12 AND FOLATE PANEL
Folate: 12.9 ng/mL
Vitamin B-12: 437 pg/mL (ref 211–911)

## 2024-07-15 LAB — MAGNESIUM: Magnesium: 2 mg/dL (ref 1.5–2.5)

## 2024-07-15 LAB — PARATHYROID HORMONE, INTACT (NO CA): PTH: 39 pg/mL (ref 16–77)

## 2024-07-15 MED ORDER — IRON (FERROUS SULFATE) 325 (65 FE) MG PO TABS: 325.0000 mg | ORAL_TABLET | Freq: Every day | ORAL | Status: AC

## 2024-07-15 NOTE — Patient Instructions (Signed)
 Medication Instructions:  Your physician recommends that you continue on your current medications as directed. Please refer to the Current Medication list given to you today.  *If you need a refill on your cardiac medications before your next appointment, please call your pharmacy*  Lab Work: None ordered  If you have labs (blood work) drawn today and your tests are completely normal, you will receive your results only by: MyChart Message (if you have MyChart) OR A paper copy in the mail If you have any lab test that is abnormal or we need to change your treatment, we will call you to review the results.  Testing/Procedures: Your physician has requested that you have an echocardiogram. Echocardiography is a painless test that uses sound waves to create images of your heart. It provides your doctor with information about the size and shape of your heart and how well your hearts chambers and valves are working. This procedure takes approximately one hour. There are no restrictions for this procedure. Please do NOT wear cologne, perfume, aftershave, or lotions (deodorant is allowed). Please arrive 15 minutes prior to your appointment time.  Please note: We ask at that you not bring children with you during ultrasound (echo/ vascular) testing. Due to room size and safety concerns, children are not allowed in the ultrasound rooms during exams. Our front office staff cannot provide observation of children in our lobby area while testing is being conducted. An adult accompanying a patient to their appointment will only be allowed in the ultrasound room at the discretion of the ultrasound technician under special circumstances. We apologize for any inconvenience.    ZIO XT- Long Term Monitor Instructions  Your physician has requested you wear a ZIO patch monitor for 14 days.  This is a single patch monitor. [employer] supplies one patch monitor per enrollment. Additional stickers are not available.  Please do not apply patch if you will be having a Nuclear Stress Test,  Echocardiogram, Cardiac CT, MRI, or Chest Xray during the period you would be wearing the  monitor. The patch cannot be worn during these tests. You cannot remove and re-apply the  ZIO XT patch monitor.  Your ZIO patch monitor will be mailed 3 day USPS to your address on file. It may take 3-5 days  to receive your monitor after you have been enrolled.  Once you have received your monitor, please review the enclosed instructions. Your monitor  has already been registered assigning a specific monitor serial # to you.  Billing and Patient Assistance Program Information  We have supplied [employer] with any of your insurance information on file for billing purposes. [employer] offers a sliding scale Patient Assistance Program for patients that do not have  insurance, or whose insurance does not completely cover the cost of the ZIO monitor.  You must apply for the Patient Assistance Program to qualify for this discounted rate.  To apply, please call [employer] at [phone], select option 4, select option 2, ask to apply for  Patient Assistance Program. Meredeth will ask your household income, and how many people  are in your household. They will quote your out-of-pocket cost based on that information.  [employer] will also be able to set up a 64-month, interest-free payment plan if needed.  Applying the monitor   Shave hair from upper left chest.  Hold abrader disc by orange tab. Rub abrader in 40 strokes over the upper left chest as  indicated in your monitor instructions.  Clean area with 4 enclosed  alcohol pads. Let dry.  Apply patch as indicated in monitor instructions. Patch will be placed under collarbone on left  side of chest with arrow pointing upward.  Rub patch adhesive wings for 2 minutes. Remove white label marked 1. Remove the white  label marked 2. Rub patch adhesive wings for 2 additional minutes.  While  looking in a mirror, press and release button in center of patch. A small green light will  flash 3-4 times. This will be your only indicator that the monitor has been turned on.  Do not shower for the first 24 hours. You may shower after the first 24 hours.  Press the button if you feel a symptom. You will hear a small click. Record Date, Time and  Symptom in the Patient Logbook.  When you are ready to remove the patch, follow instructions on the last 2 pages of Patient  Logbook. Stick patch monitor onto the last page of Patient Logbook.  Place Patient Logbook in the blue and white box. Use locking tab on box and tape box closed  securely. The blue and white box has prepaid postage on it. Please place it in the mailbox as  soon as possible. Your physician should have your test results approximately 7 days after the  monitor has been mailed back to [facility].  Call [facility] Customer Care at [phone] if you have questions regarding  your ZIO XT patch monitor. Call them immediately if you see an orange light blinking on your  monitor.  If your monitor falls off in less than 4 days, contact our Monitor department at [phone].  If your monitor becomes loose or falls off after 4 days call [employer] at [phone] for  suggestions on securing your monitor  Follow-Up: At [facility], you and your health needs are our priority.  As part of our continuing mission to provide you with exceptional heart care, our providers are all part of one team.  This team includes your primary Cardiologist (physician) and Advanced Practice Providers or APPs (Physician Assistants and Nurse Practitioners) who all work together to provide you with the care you need, when you need it.  Your next appointment:    As scheduled   Provider:   Stanly DELENA Leavens, MD    We recommend signing up for the patient portal called MyChart.  Sign up information is provided on this After Visit  Summary.  MyChart is used to connect with patients for Virtual Visits (Telemedicine).  Patients are able to view lab/test results, encounter notes, upcoming appointments, etc.  Non-urgent messages can be sent to your provider as well.   To learn more about what you can do with MyChart, go to [email or web address].   Other Instructions

## 2024-07-15 NOTE — Telephone Encounter (Signed)
 CRITICAL VALUE STICKER  CRITICAL VALUE: Hemoglobin 7.4  RECEIVER (on-site recipient of call): Nishka Heide  DATE & TIME NOTIFIED: 07/15/2024 at 10:02 am  MESSENGER (representative from lab): Darice

## 2024-07-15 NOTE — Progress Notes (Signed)
 " Cardiology Office Note:    Date:  07/15/2024   ID:  Christie [name], DOB 08/08/1985, MRN [id number]  PCP:  Wheeler Harlene CROME, NP   [facility] Providers Cardiologist:  Stanly DELENA Leavens, MD     Referring MD: Wheeler Harlene CROME, NP   Chief Complaint  Patient presents with   New Patient (Initial Visit)    Palpitations, heart murmur    History of Present Illness:    Christie [name] is a 39 y.o. female with a hx of type 1 DM, thalassemia trait, depression, bipolar disorder. She has been on insulin  for 12 years, A1c 8.4% - following with endocrinology.   She was seen in the ER 07/11/24 for right sided neck and back pain that radiated to her chest. This occurred after lifting a cousin at a christmas gathering 1 week prior. Troponin was negative and EKG nonischemic. She was treated for MSK pain with relief in symptoms. She was discharged without admission.    Of note, abnormal labs during ER visit - repeat Hb was 7.4. No signs of bleeding but she has been taking ibuprofen . Iron  panel also noted, she is pending iron  infusion and occult card.   She denies chest pain, but does battle anxiety and has had heart racing, but when checked he HR is normal. She has near constant anxiety that started about 2 weeks ago - associated with subjective feelings of heart beating abnormally/.jittery feeling. No syncope.   No dedicated exercise routine, but lives on the second floor and climbs the stairs with groceries and laundry - no angina.   She lives at home with three children 44, 65, 62 yo, two are special needs. She has a twins sister, older brother and mother nearby.  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   She is a never smoker, no THC, very rare alcohol, no illicit drug.   Family history Mother no heart issues Father had cardiomyopathy with heart transplant, his father also died of cardiomyopathy Twin sister - healthy Three  brothers - no heart issues   Past Medical History:  Diagnosis Date   Abnormal Pap smear    colpo scheduled   Alpha thalassemia trait    Anemia    Anxiety    Asthma    Bipolar 1 disorder (HCC)    Celiac disease    Chlamydia    Depression    Diabetes mellitus    type 1 on insulin    DM (diabetes mellitus), type 1 (HCC)    Fx ankle    history of left ankle fx at age 31   Headache(784.0)    Herpes simplex infection    Hx of migraines    Pregnancy induced hypertension    Urinary tract infection     Past Surgical History:  Procedure Laterality Date   CESAREAN SECTION Bilateral 08/21/2018   Procedure: CESAREAN SECTION WITH BILATERAL TUBAL LIGATION;  Surgeon: Nicholaus Burnard HERO, MD;  Location: [facility] BIRTHING SUIT[facility];  Service: Obstetrics;  Laterality: Bilateral;   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   TUBAL LIGATION     WISDOM TOOTH EXTRACTION      Current Medications: Active Medications[1]   Allergies:   Banana, Adhesive [tape], Citrus, and Tapentadol   Social History   Socioeconomic History   Marital status: Single    Spouse name: Not on file   Number of children: Not on file   Years of  education: Not on file   Highest education level: Associate degree: occupational, scientist, product/process development, or vocational program  Occupational History   Not on file  Tobacco Use   Smoking status: Never    Passive exposure: Never   Smokeless tobacco: Never  Vaping Use   Vaping status: Never Used  Substance and Sexual Activity   Alcohol use: Not Currently   Drug use: No    Comment: last used 1 years ago   Sexual activity: Yes    Birth control/protection: Surgical  Other Topics Concern   Not on file  Social History Narrative   Right handed   Social Drivers of Health   Tobacco Use: Low Risk (07/15/2024)   Patient History    Smoking Tobacco Use: Never    Smokeless Tobacco Use: Never    Passive Exposure: Never  Financial Resource Strain: High Risk (07/09/2023)   Overall Financial  Resource Strain (CARDIA)    Difficulty of Paying Living Expenses: Very hard  Food Insecurity: Food Insecurity Present (07/09/2023)   Hunger Vital Sign    Worried About Running Out of Food in the Last Year: Sometimes true    Ran Out of Food in the Last Year: Never true  Transportation Needs: No Transportation Needs (07/09/2023)   PRAPARE - Administrator, Civil Service (Medical): No    Lack of Transportation (Non-Medical): No  Physical Activity: Insufficiently Active (07/09/2023)   Exercise Vital Sign    Days of Exercise per Week: 2 days    Minutes of Exercise per Session: 50 min  Stress: Stress Concern Present (07/09/2023)   [employer] of Occupational Health - Occupational Stress Questionnaire    Feeling of Stress : Very much  Social Connections: Socially Isolated (07/09/2023)   Social Connection and Isolation Panel    Frequency of Communication with Friends and Family: Never    Frequency of Social Gatherings with Friends and Family: Never    Attends Religious Services: Never    Database Administrator or Organizations: No    Attends Engineer, Structural: Not on file    Marital Status: Never married  Depression (PHQ2-9): Low Risk (07/14/2024)   Depression (PHQ2-9)    PHQ-2 Score: 0  Alcohol Screen: Low Risk (07/09/2023)   Alcohol Screen    Last Alcohol Screening Score (AUDIT): 4  Housing: Unknown (07/09/2023)   Housing Stability Vital Sign    Unable to Pay for Housing in the Last Year: No    Number of Times Moved in the Last Year: Not on file    Homeless in the Last Year: No  Utilities: Not on file  Health Literacy: Not on file     Family History: The patient's family history includes Asthma in her father and mother; Autism in her son; Autism spectrum disorder in her daughter; CAD in her father; Cancer in her maternal grandmother; Colon cancer in her maternal grandmother, maternal uncle, and maternal uncle; Diabetes in her maternal grandmother and  mother; Heart disease in her father; Hypertension in her mother and sister; Obesity in an other family member; Sleep apnea in an other family member. There is no history of Anesthesia problems.  ROS:   Please see the history of present illness.     All other systems reviewed and are negative.  EKGs/Labs/Other Studies Reviewed:    The following studies were reviewed today:       Recent Labs: 08/08/2023: ALT 9; TSH 2.79 07/14/2024: BUN 13; Creatinine, Ser 0.69; Hemoglobin 7.4 Repeated and verified  X2.; Magnesium  2.0; Platelets 186.0; Potassium 3.6; Sodium 137  Recent Lipid Panel    Component Value Date/Time   CHOL 180 08/08/2023 0000   TRIG 48 08/08/2023 0000   HDL 45 08/08/2023 0000   CHOLHDL 2.6 11/12/2021 2147   VLDL 18 11/12/2021 2147   LDLCALC 126 08/08/2023 0000     Risk Assessment/Calculations:                Physical Exam:    VS:  BP 126/84   Pulse 86   Ht 5' 9 (1.753 m)   Wt 221 lb 1.3 oz (100.3 kg)   LMP 06/22/2024 (Approximate)   SpO2 97%   BMI 32.65 kg/m     Wt Readings from Last 3 Encounters:  07/15/24 221 lb 1.3 oz (100.3 kg)  07/14/24 217 lb (98.4 kg)  07/11/24 210 lb (95.3 kg)     GEN:  Well nourished, well developed in no acute distress HEENT: Normal NECK: No JVD; No carotid bruits LYMPHATICS: No lymphadenopathy CARDIAC: RRR, no murmurs, rubs, gallops RESPIRATORY:  Clear to auscultation without rales, wheezing or rhonchi  ABDOMEN: Soft, non-tender, non-distended MUSCULOSKELETAL:  No edema; No deformity  SKIN: Warm and dry NEUROLOGIC:  Alert and oriented x 3 PSYCHIATRIC:  Normal affect   ASSESSMENT:    1. Heart murmur   2. Palpitations   3. Type 1 diabetes mellitus with hyperglycemia (HCC)   4. Anemia, unspecified type   5. Anxiety    PLAN:    In order of problems listed above:  Palpitations - will place 14 day heart monitor - recent labs reassuring - palpitations began 2 weeks ago and feels like a jittery feeling - also  recommend OTC Mg   Heart murmur - I agree she has a soft systolic murmur best heard at LSB - given paternal history of cardiomyopathy, I will obtain echo - I will also have her follow up with Dr. Santo in the event she may need genetic counseling or cMRI   Acute on chronic anemia - Hb and iron  studies concerning  - she is pending iron  transfusion - pending occult stool card - anemia could be worsening underlying condition   IDDM - uncontrolled Hx of pre-eclampsia - per primary - advised to stop NSAIDs   Follow up after studies.     Medication Adjustments/Labs and Tests Ordered: Current medicines are reviewed at length with the patient today.  Concerns regarding medicines are outlined above.  Orders Placed This Encounter  Procedures   LONG TERM MONITOR XT (3-14 DAYS)   ECHOCARDIOGRAM COMPLETE   No orders of the defined types were placed in this encounter.   Patient Instructions  Medication Instructions:  Your physician recommends that you continue on your current medications as directed. Please refer to the Current Medication list given to you today.  *If you need a refill on your cardiac medications before your next appointment, please call your pharmacy*  Lab Work: None ordered  If you have labs (blood work) drawn today and your tests are completely normal, you will receive your results only by: MyChart Message (if you have MyChart) OR A paper copy in the mail If you have any lab test that is abnormal or we need to change your treatment, we will call you to review the results.  Testing/Procedures: Your physician has requested that you have an echocardiogram. Echocardiography is a painless test that uses sound waves to create images of your heart. It provides your doctor with information about the  size and shape of your heart and how well your hearts chambers and valves are working. This procedure takes approximately one hour. There are no restrictions for  this procedure. Please do NOT wear cologne, perfume, aftershave, or lotions (deodorant is allowed). Please arrive 15 minutes prior to your appointment time.  Please note: We ask at that you not bring children with you during ultrasound (echo/ vascular) testing. Due to room size and safety concerns, children are not allowed in the ultrasound rooms during exams. Our front office staff cannot provide observation of children in our lobby area while testing is being conducted. An adult accompanying a patient to their appointment will only be allowed in the ultrasound room at the discretion of the ultrasound technician under special circumstances. We apologize for any inconvenience.    ZIO XT- Long Term Monitor Instructions  Your physician has requested you wear a ZIO patch monitor for 14 days.  This is a single patch monitor. [employer] supplies one patch monitor per enrollment. Additional stickers are not available. Please do not apply patch if you will be having a Nuclear Stress Test,  Echocardiogram, Cardiac CT, MRI, or Chest Xray during the period you would be wearing the  monitor. The patch cannot be worn during these tests. You cannot remove and re-apply the  ZIO XT patch monitor.  Your ZIO patch monitor will be mailed 3 day USPS to your address on file. It may take 3-5 days  to receive your monitor after you have been enrolled.  Once you have received your monitor, please review the enclosed instructions. Your monitor  has already been registered assigning a specific monitor serial # to you.  Billing and Patient Assistance Program Information  We have supplied [employer] with any of your insurance information on file for billing purposes. [employer] offers a sliding scale Patient Assistance Program for patients that do not have  insurance, or whose insurance does not completely cover the cost of the ZIO monitor.  You must apply for the Patient Assistance Program to qualify for this discounted rate.   To apply, please call [employer] at [phone], select option 4, select option 2, ask to apply for  Patient Assistance Program. Meredeth will ask your household income, and how many people  are in your household. They will quote your out-of-pocket cost based on that information.  [employer] will also be able to set up a 44-month, interest-free payment plan if needed.  Applying the monitor   Shave hair from upper left chest.  Hold abrader disc by orange tab. Rub abrader in 40 strokes over the upper left chest as  indicated in your monitor instructions.  Clean area with 4 enclosed alcohol pads. Let dry.  Apply patch as indicated in monitor instructions. Patch will be placed under collarbone on left  side of chest with arrow pointing upward.  Rub patch adhesive wings for 2 minutes. Remove white label marked 1. Remove the white  label marked 2. Rub patch adhesive wings for 2 additional minutes.  While looking in a mirror, press and release button in center of patch. A small green light will  flash 3-4 times. This will be your only indicator that the monitor has been turned on.  Do not shower for the first 24 hours. You may shower after the first 24 hours.  Press the button if you feel a symptom. You will hear a small click. Record Date, Time and  Symptom in the Patient Logbook.  When you are ready to remove  the patch, follow instructions on the last 2 pages of Patient  Logbook. Stick patch monitor onto the last page of Patient Logbook.  Place Patient Logbook in the blue and white box. Use locking tab on box and tape box closed  securely. The blue and white box has prepaid postage on it. Please place it in the mailbox as  soon as possible. Your physician should have your test results approximately 7 days after the  monitor has been mailed back to [facility].  Call [facility] Customer Care at [phone] if you have questions regarding  your ZIO XT patch monitor. Call them immediately  if you see an orange light blinking on your  monitor.  If your monitor falls off in less than 4 days, contact our Monitor department at [phone].  If your monitor becomes loose or falls off after 4 days call [employer] at [phone] for  suggestions on securing your monitor  Follow-Up: At [facility], you and your health needs are our priority.  As part of our continuing mission to provide you with exceptional heart care, our providers are all part of one team.  This team includes your primary Cardiologist (physician) and Advanced Practice Providers or APPs (Physician Assistants and Nurse Practitioners) who all work together to provide you with the care you need, when you need it.  Your next appointment:    As scheduled   Provider:   Stanly DELENA Leavens, MD    We recommend signing up for the patient portal called MyChart.  Sign up information is provided on this After Visit Summary.  MyChart is used to connect with patients for Virtual Visits (Telemedicine).  Patients are able to view lab/test results, encounter notes, upcoming appointments, etc.  Non-urgent messages can be sent to your provider as well.   To learn more about what you can do with MyChart, go to [email or web address].   Other Instructions            Signed, Jon Nat Hails, [place]  07/15/2024 2:13 PM    [facility]     [1]  Current Meds  Medication Sig   aspirin -acetaminophen -caffeine  (EXCEDRIN MIGRAINE) 250-250-65 MG tablet Take 2 tablets by mouth every 6 (six) hours as needed for headache or migraine.   Continuous Blood Gluc Transmit (DEXCOM G6 TRANSMITTER) MISC AS DIRECTED FOR 90 DAYS   Continuous Glucose Receiver (DEXCOM G7 RECEIVER) DEVI by Does not apply route.   Continuous Glucose Sensor (DEXCOM G7 SENSOR) MISC by Does not apply route.   cyclobenzaprine  (FLEXERIL ) 10 MG tablet Take 1 tablet (10 mg total) by mouth 2 (two) times daily as needed for muscle spasms.   insulin   aspart (NOVOLOG ) 100 UNIT/ML injection TAKE AFTER MEALS- 200-250 TAKE 4 UNITS, 251-300 TAKE 6 UNITS, 301-350 8 UNITS , 351-400 10 UNITS.   Insulin  Disposable Pump (OMNIPOD 5 G6 POD, GEN 5,) MISC    valACYclovir  (VALTREX ) 500 MG tablet Take 500 mg by mouth as needed (taking as needed for outbreaks).   VENTOLIN  HFA 108 (90 Base) MCG/ACT inhaler INHALE 1-2 PUFFS BY MOUTH EVERY 6 HOURS AS NEEDED FOR WHEEZE OR SHORTNESS OF BREATH   [DISCONTINUED] fluconazole  (DIFLUCAN ) 150 MG tablet Take 1 tablet (150 mg total) by mouth daily. May repeat in 3 days if needed.   [DISCONTINUED] metroNIDAZOLE  (FLAGYL ) 500 MG tablet Take 1 tablet (500 mg total) by mouth 2 (two) times daily.   [DISCONTINUED] valACYclovir  (VALTREX ) 500 MG tablet Take 1 tablet (500 mg total) by mouth daily. Can  increase to twice a day for 5 days in the event of a recurrence   "

## 2024-07-15 NOTE — Progress Notes (Unsigned)
 Enrolled patient for a 14 day Zio XT monitor to be mailed to patients home  Chandrasekhar to read

## 2024-07-15 NOTE — Telephone Encounter (Signed)
 CRITICAL VALUE STICKER  CRITICAL VALUE: blood glucose of 44  MESSENGER (representative from lab): Saa

## 2024-07-16 ENCOUNTER — Other Ambulatory Visit (INDEPENDENT_AMBULATORY_CARE_PROVIDER_SITE_OTHER)

## 2024-07-16 DIAGNOSIS — D649 Anemia, unspecified: Secondary | ICD-10-CM

## 2024-07-16 DIAGNOSIS — E1065 Type 1 diabetes mellitus with hyperglycemia: Secondary | ICD-10-CM | POA: Diagnosis not present

## 2024-07-17 ENCOUNTER — Telehealth: Payer: Self-pay

## 2024-07-17 ENCOUNTER — Ambulatory Visit: Payer: Self-pay | Admitting: Student

## 2024-07-17 ENCOUNTER — Other Ambulatory Visit: Payer: Self-pay

## 2024-07-17 ENCOUNTER — Ambulatory Visit

## 2024-07-17 DIAGNOSIS — D649 Anemia, unspecified: Secondary | ICD-10-CM | POA: Diagnosis not present

## 2024-07-17 LAB — CBC WITH DIFFERENTIAL/PLATELET
Basophils Relative: 0.9 % (ref 0.0–3.0)
Eosinophils Relative: 3 % (ref 0.0–5.0)
HCT: 26.8 % — ABNORMAL LOW (ref 36.0–46.0)
Hemoglobin: 7.7 g/dL — CL (ref 12.0–15.0)
Lymphocytes Relative: 60 % — ABNORMAL HIGH (ref 12.0–46.0)
MCHC: 28.7 g/dL — ABNORMAL LOW (ref 30.0–36.0)
MCV: 63.5 fl — ABNORMAL LOW (ref 78.0–100.0)
Monocytes Relative: 17 % — ABNORMAL HIGH (ref 3.0–12.0)
Neutrophils Relative %: 20 % — ABNORMAL LOW (ref 43.0–77.0)
Platelets: 207 K/uL (ref 150.0–400.0)
RBC: 4.21 Mil/uL (ref 3.87–5.11)
RDW: 19.7 % — ABNORMAL HIGH (ref 11.5–15.5)
WBC: 4.4 K/uL (ref 4.0–10.5)

## 2024-07-17 LAB — BASIC METABOLIC PANEL WITH GFR
BUN: 12 mg/dL (ref 6–23)
CO2: 28 meq/L (ref 19–32)
Calcium: 9.2 mg/dL (ref 8.4–10.5)
Chloride: 102 meq/L (ref 96–112)
Creatinine, Ser: 0.86 mg/dL (ref 0.40–1.20)
GFR: 86.98 mL/min
Glucose, Bld: 110 mg/dL — ABNORMAL HIGH (ref 70–99)
Potassium: 4.2 meq/L (ref 3.5–5.1)
Sodium: 137 meq/L (ref 135–145)

## 2024-07-17 LAB — HEMOGLOBIN A1C: Hgb A1c MFr Bld: 8 % — ABNORMAL HIGH (ref 4.6–6.5)

## 2024-07-17 NOTE — Telephone Encounter (Signed)
 CRITICAL VALUE STICKER  CRITICAL VALUE: Hemoglobin 7.7

## 2024-07-17 NOTE — Addendum Note (Signed)
 Addended by: EMERY PRESIDENT D on: 07/17/2024 03:40 PM   Modules accepted: Orders

## 2024-07-20 ENCOUNTER — Other Ambulatory Visit: Payer: Self-pay | Admitting: *Deleted

## 2024-07-20 ENCOUNTER — Ambulatory Visit: Attending: Physician Assistant

## 2024-07-20 ENCOUNTER — Telehealth: Payer: Self-pay | Admitting: Physician Assistant

## 2024-07-20 DIAGNOSIS — R002 Palpitations: Secondary | ICD-10-CM

## 2024-07-20 NOTE — Telephone Encounter (Signed)
 Pt has burns on her chest from heart monitor and would like to know if there are other options. Monitor removed before full 14 days. Please advise.

## 2024-07-20 NOTE — Telephone Encounter (Signed)
 Patient described burns from the electrode , (not the adhesive), from the ZIO patch.  She removed it in less than 24 hours.  I cancelled the order / [employer] enrollment.  [employer] notified via email and patient asked to please call [employer] to document, and so they may investigate for a possible faulty monitor.  Patient will allow her skin to heal.  I will enroll her with the [employer] for a 14 day long term monitor using Hydrocolloid sensitive skin electrodes for her to apply after she heals.

## 2024-07-21 NOTE — Progress Notes (Unsigned)
 [facility] long term monitor with Hydrocolloid sensitive skin strips sent to patient.  Dr. Santo to read.

## 2024-07-24 ENCOUNTER — Encounter: Payer: Self-pay | Admitting: Student

## 2024-07-24 NOTE — Telephone Encounter (Signed)
 Appts switched.

## 2024-08-05 ENCOUNTER — Encounter: Payer: Self-pay | Admitting: Medical Oncology

## 2024-08-05 ENCOUNTER — Inpatient Hospital Stay: Attending: Medical Oncology

## 2024-08-05 ENCOUNTER — Ambulatory Visit: Payer: Self-pay | Admitting: Medical Oncology

## 2024-08-05 ENCOUNTER — Inpatient Hospital Stay: Admitting: Medical Oncology

## 2024-08-05 VITALS — BP 116/76 | HR 83 | Temp 98.1°F | Resp 20 | Ht 69.0 in | Wt 211.1 lb

## 2024-08-05 DIAGNOSIS — E109 Type 1 diabetes mellitus without complications: Secondary | ICD-10-CM | POA: Insufficient documentation

## 2024-08-05 DIAGNOSIS — K5900 Constipation, unspecified: Secondary | ICD-10-CM

## 2024-08-05 DIAGNOSIS — Z79899 Other long term (current) drug therapy: Secondary | ICD-10-CM | POA: Insufficient documentation

## 2024-08-05 DIAGNOSIS — N939 Abnormal uterine and vaginal bleeding, unspecified: Secondary | ICD-10-CM | POA: Diagnosis not present

## 2024-08-05 DIAGNOSIS — D500 Iron deficiency anemia secondary to blood loss (chronic): Secondary | ICD-10-CM

## 2024-08-05 DIAGNOSIS — F5089 Other specified eating disorder: Secondary | ICD-10-CM | POA: Insufficient documentation

## 2024-08-05 DIAGNOSIS — J45909 Unspecified asthma, uncomplicated: Secondary | ICD-10-CM

## 2024-08-05 DIAGNOSIS — R5383 Other fatigue: Secondary | ICD-10-CM | POA: Diagnosis not present

## 2024-08-05 DIAGNOSIS — D563 Thalassemia minor: Secondary | ICD-10-CM | POA: Insufficient documentation

## 2024-08-05 DIAGNOSIS — Z91018 Allergy to other foods: Secondary | ICD-10-CM

## 2024-08-05 DIAGNOSIS — Z87440 Personal history of urinary (tract) infections: Secondary | ICD-10-CM

## 2024-08-05 DIAGNOSIS — R0602 Shortness of breath: Secondary | ICD-10-CM | POA: Diagnosis not present

## 2024-08-05 DIAGNOSIS — D638 Anemia in other chronic diseases classified elsewhere: Secondary | ICD-10-CM | POA: Insufficient documentation

## 2024-08-05 DIAGNOSIS — R7989 Other specified abnormal findings of blood chemistry: Secondary | ICD-10-CM

## 2024-08-05 DIAGNOSIS — R531 Weakness: Secondary | ICD-10-CM | POA: Diagnosis not present

## 2024-08-05 LAB — IRON AND IRON BINDING CAPACITY (CC-WL,HP ONLY)
Iron: 295 ug/dL — ABNORMAL HIGH (ref 28–170)
Saturation Ratios: 67 % — ABNORMAL HIGH (ref 10.4–31.8)
TIBC: 441 ug/dL (ref 250–450)
UIBC: 146 ug/dL

## 2024-08-05 LAB — CBC WITH DIFFERENTIAL (CANCER CENTER ONLY)
Abs Immature Granulocytes: 0 10*3/uL (ref 0.00–0.07)
Basophils Absolute: 0 10*3/uL (ref 0.0–0.1)
Basophils Relative: 0 %
Eosinophils Absolute: 0 10*3/uL (ref 0.0–0.5)
Eosinophils Relative: 1 %
HCT: 33.9 % — ABNORMAL LOW (ref 36.0–46.0)
Hemoglobin: 9.7 g/dL — ABNORMAL LOW (ref 12.0–15.0)
Immature Granulocytes: 0 %
Lymphocytes Relative: 53 %
Lymphs Abs: 2 10*3/uL (ref 0.7–4.0)
MCH: 20.1 pg — ABNORMAL LOW (ref 26.0–34.0)
MCHC: 28.6 g/dL — ABNORMAL LOW (ref 30.0–36.0)
MCV: 70.3 fL — ABNORMAL LOW (ref 80.0–100.0)
Monocytes Absolute: 0.4 10*3/uL (ref 0.1–1.0)
Monocytes Relative: 10 %
Neutro Abs: 1.4 10*3/uL — ABNORMAL LOW (ref 1.7–7.7)
Neutrophils Relative %: 36 %
Platelet Count: 287 10*3/uL (ref 150–400)
RBC: 4.82 MIL/uL (ref 3.87–5.11)
RDW: 25 % — ABNORMAL HIGH (ref 11.5–15.5)
WBC Count: 3.8 10*3/uL — ABNORMAL LOW (ref 4.0–10.5)
nRBC: 0 % (ref 0.0–0.2)

## 2024-08-05 LAB — HEPATIC FUNCTION PANEL
ALT: 19 U/L (ref 0–44)
AST: 26 U/L (ref 15–41)
Albumin: 4.2 g/dL (ref 3.5–5.0)
Alkaline Phosphatase: 63 U/L (ref 38–126)
Bilirubin, Direct: <0.1 mg/dL (ref 0.0–0.2)
Total Bilirubin: 0.2 mg/dL (ref 0.0–1.2)
Total Protein: 7.6 g/dL (ref 6.5–8.1)

## 2024-08-05 LAB — RETIC PANEL
Immature Retic Fract: 9.4 % (ref 2.3–15.9)
RBC.: 4.84 MIL/uL (ref 3.87–5.11)
Retic Count, Absolute: 55.2 10*3/uL (ref 19.0–186.0)
Retic Ct Pct: 1.1 % (ref 0.4–3.1)
Reticulocyte Hemoglobin: 28.1 pg

## 2024-08-05 LAB — FERRITIN: Ferritin: 41 ng/mL (ref 11–307)

## 2024-08-05 LAB — SAVE SMEAR(SSMR), FOR PROVIDER SLIDE REVIEW

## 2024-08-05 MED ORDER — FOLIC ACID 1 MG PO TABS: 1.0000 mg | ORAL_TABLET | Freq: Every day | ORAL | 3 refills | Status: AC

## 2024-08-05 NOTE — Progress Notes (Signed)
 " Hematology and Oncology New Patient Office Visit   Christie [name] [id number] 1986/03/12 39 y.o. 08/05/2024  Past Medical History:  Diagnosis Date   Abnormal Pap smear    colpo scheduled   Alpha thalassemia trait    Anemia    Anxiety    Asthma    Bipolar 1 disorder (HCC)    Celiac disease    Chlamydia    Depression    Diabetes mellitus    type 1 on insulin    DM (diabetes mellitus), type 1 (HCC)    Fx ankle    history of left ankle fx at age 57   Headache(784.0)    Herpes simplex infection    Hx of migraines    Pregnancy induced hypertension    Urinary tract infection     Principle Diagnosis:  Iron  Deficiency Anemia Alpha Thalassemia Trait   Current Therapy:   Oral iron   PriorTherapy:   N/A    Interim History:  Christie [name] is here for evaluation for iron  deficiency anemia:  She was found to be very anemic about 1-2 months ago compared to her normal anemia. This was suspected to be secondary to Advil  use for dental pain or headaches that she was having at the time. She is no longer using NSAIDS and her pain has resolved with dental treatment. Her last Hgb on 07/17/2024 was 7.7. Her baseline is around 9.5-10.5.   She was diagnosed with alpha thalassemia trait about 6 years ago. She is not currently on folic acid .   Blood in stools:No Change of bowel movement/no new constipation: No Family history of GI cancers: Yes- Maternal uncles (diagnosed in early 50s) and maternal grandmother.  Last Colonoscopy: None Last Endoscopy: None History of GERD or GI bleed: Not outside of pregnancy  UC/Crohn's: No PPI use: No Hiatal Hernia: No NSAID use: Previously taking significant doses of daily ibuprofen  - has stopped this now Blood in urine: No Difficulty swallowing: No Pica: Yes SOB: Yes Fatigue: Yes Prior blood transfusion: No Prior history of blood loss: No Liver disease: No Kidney Disease: None Bariatric/Intestinal surgery: No Frequent Blood Donation:  No Vaginal bleeding: Irregular menstrual cycles but they do occur every month. Lasting about 7 days. First two days are heavy.  Prior evaluation with hematology: No Prior bone marrow biopsy: No Oral iron : Currently taking oral iron  supplement for the last 2 weeks. Tolerating well.  Prior IV iron  infusions: No Family history Anemia: No known sickle cell  No Personal or family history of bleeding or clotting disorders.  Special diets: No No use of GLP-1: Last dose was about 2 weeks ago.  Iron  rich foods in diet: Yes Eating habits: She tries to eat at least 1-2 meals per day.      Wt Readings from Last 3 Encounters:  08/05/24 211 lb 1.9 oz (95.8 kg)  07/15/24 221 lb 1.3 oz (100.3 kg)  07/14/24 217 lb (98.4 kg)     Medications:  Current Medications[1]  Allergies: Allergies[2]  Past Medical History, Surgical history, Social history, and Family History were reviewed and updated.  Review of Systems: Review of Systems  Constitutional: Negative.   HENT:  Negative.    Eyes: Negative.   Respiratory:  Positive for shortness of breath.   Cardiovascular: Negative.   Gastrointestinal:  Positive for constipation.  Endocrine: Negative.   Genitourinary: Negative.    Musculoskeletal: Negative.   Skin: Negative.   Neurological: Negative.   Hematological: Negative.   Psychiatric/Behavioral: Negative.  Physical Exam:  height is 5' 9 (1.753 m) and weight is 211 lb 1.9 oz (95.8 kg). Her oral temperature is 98.1 F (36.7 C). Her blood pressure is 116/76 and her pulse is 83. Her respiration is 20 and oxygen saturation is 100%.   Physical Exam General: NAD HEENT: Conjunctiva and gums with mild pallor  Cardiovascular: regular rate and rhythm Pulmonary: clear ant fields Abdomen: soft, nontender, + bowel sounds GU: no suprapubic tenderness Extremities: no edema, no joint deformities Skin: no rashes Neurological: Weakness but otherwise nonfocal   Lab Results  Component Value Date    WBC 4.4 07/17/2024   HGB 7.7 Repeated and verified X2. (LL) 07/17/2024   HCT 26.8 (L) 07/17/2024   MCV 63.5 (L) 07/17/2024   PLT 207.0 07/17/2024     Chemistry      Component Value Date/Time   NA 137 07/16/2024 1454   NA 136 02/11/2018 1147   K 4.2 07/16/2024 1454   CL 102 07/16/2024 1454   CO2 28 07/16/2024 1454   BUN 12 07/16/2024 1454   BUN 9 02/11/2018 1147   CREATININE 0.86 07/16/2024 1454   CREATININE 0.73 04/11/2012 1154      Component Value Date/Time   CALCIUM  9.2 07/16/2024 1454   ALKPHOS 43 08/08/2023 0000   AST 17 08/08/2023 0000   ALT 9 08/08/2023 0000   BILITOT 0.4 09/04/2022 1647   BILITOT 0.4 02/11/2018 1147     Encounter Diagnoses  Name Primary?   Iron  deficiency anemia due to chronic blood loss Yes   Abnormal CBC    Assessment and Plan- Patient is a 39 y.o. female who was referred to us  for her anemia. She has chronic anemia secondary to her Alpha Thalassemia Trait. She has acute on chronic anemia secondary to iron  deficiency. Given her history and family history I have heavily suggested GI evaluation to which she is agreeable to a GI referral. She would likely benefit from an endoscopy, capsule study and colonoscopy.   She is very anemic with a 07/17/2024 Hgb of 7.7, MCV of 63.5. Preserves platelets of 207. WBC is normal at 4.4 however differential is abnormal with a neutro % of 20, Lymph % of 60, monocyte % of 17. Will investigate further with flow cytometry as well. I discussed that with nutritional/Hgb correction these abnormalities may resolve however if they continue she may benefit from additional work up.    Recent kidney function is normal- Hepatic function panel pending  Recent iron  was 30, iron  saturation was 7%, ferritin 5, TIBC 428  Recent B12 and folate are normal- Given her history of thalassemia with anemia I have suggested folic acid  supplementation which I have sent into the pharmacy for her.   She has now been on oral iron  x 2 weeks.  We will recheck levels to see if she is making any progress- if not I would suggest IV iron . We discussed IV infusions including how they are administered, length of infusion time, common side effects and the low risk of infusion reactions. She is aware that often when we replenish stores in individuals who are very deficient they typically will need a few series of infusions before labs are stable.    We will plan to see her again in 1 month for APP visit and labs.   Disposition: RTC 1 month APP, labs    Annissa Andreoni PA-C 1/28/20261:49 PM       [1]  Current Outpatient Medications:    aspirin -acetaminophen -caffeine  (EXCEDRIN MIGRAINE) 250-250-65  MG tablet, Take 2 tablets by mouth every 6 (six) hours as needed for headache or migraine., Disp: , Rfl:    Continuous Glucose Receiver (DEXCOM G7 RECEIVER) DEVI, by Does not apply route., Disp: , Rfl:    Continuous Glucose Sensor (DEXCOM G7 SENSOR) MISC, by Does not apply route., Disp: , Rfl:    cyclobenzaprine  (FLEXERIL ) 10 MG tablet, Take 1 tablet (10 mg total) by mouth 2 (two) times daily as needed for muscle spasms., Disp: 20 tablet, Rfl: 0   folic acid  (FOLVITE ) 1 MG tablet, Take 1 tablet (1 mg total) by mouth daily., Disp: 90 tablet, Rfl: 3   insulin  aspart (NOVOLOG ) 100 UNIT/ML injection, TAKE AFTER MEALS- 200-250 TAKE 4 UNITS, 251-300 TAKE 6 UNITS, 301-350 8 UNITS , 351-400 10 UNITS., Disp: 20 mL, Rfl: 2   Insulin  Disposable Pump (OMNIPOD 5 G6 POD, GEN 5,) MISC, , Disp: , Rfl:    Insulin  Disposable Pump (OMNIPOD 5 G7 PODS, GEN 5,) MISC, change pods every three days, Disp: , Rfl:    Iron , Ferrous Sulfate , 325 (65 Fe) MG TABS, Take 325 mg by mouth daily., Disp: , Rfl:    Multiple Vitamins-Minerals (MULTIVITAMIN WITH MINERALS) tablet, Take 1 tablet by mouth daily., Disp: , Rfl:    valACYclovir  (VALTREX ) 500 MG tablet, Take 500 mg by mouth as needed (taking as needed for outbreaks)., Disp: , Rfl:    VENTOLIN  HFA 108 (90 Base) MCG/ACT  inhaler, INHALE 1-2 PUFFS BY MOUTH EVERY 6 HOURS AS NEEDED FOR WHEEZE OR SHORTNESS OF BREATH, Disp: 18 each, Rfl: 5   semaglutide-weight management (WEGOVY) 1.5 MG tablet, Take 1.5 mg by mouth daily. (Patient not taking: Reported on 08/05/2024), Disp: , Rfl:  [2]  Allergies Allergen Reactions   Banana Anaphylaxis   Adhesive [Tape] Rash   Citrus Rash and Other (See Comments)    Fresh only - Gums bleed   Tapentadol Rash   "

## 2024-08-06 ENCOUNTER — Ambulatory Visit (HOSPITAL_COMMUNITY)
Admission: RE | Admit: 2024-08-06 | Discharge: 2024-08-06 | Disposition: A | Discharge status: Home / Self Care | Source: Ambulatory Visit | Attending: Cardiology | Admitting: Cardiology

## 2024-08-06 DIAGNOSIS — R011 Cardiac murmur, unspecified: Secondary | ICD-10-CM | POA: Diagnosis not present

## 2024-08-06 LAB — SURGICAL PATHOLOGY

## 2024-08-06 LAB — ECHOCARDIOGRAM COMPLETE: S' Lateral: 3.63 cm

## 2024-08-07 ENCOUNTER — Ambulatory Visit: Payer: Self-pay | Admitting: Physician Assistant

## 2024-08-10 LAB — FLOW CYTOMETRY

## 2024-08-13 ENCOUNTER — Ambulatory Visit: Admitting: Student

## 2024-08-19 ENCOUNTER — Ambulatory Visit: Admitting: Cardiology

## 2024-08-25 ENCOUNTER — Encounter: Admitting: Student

## 2024-08-28 ENCOUNTER — Ambulatory Visit: Admitting: Internal Medicine

## 2024-09-03 ENCOUNTER — Inpatient Hospital Stay: Admitting: Medical Oncology

## 2024-09-03 ENCOUNTER — Inpatient Hospital Stay
